# Patient Record
Sex: Female | Born: 1970 | Race: Black or African American | Hispanic: No | Smoking: Never smoker
Health system: Southern US, Community
[De-identification: ages and names within clinical notes are randomized; demographics above are authoritative.]

## PROBLEM LIST (undated history)

## (undated) DIAGNOSIS — E785 Hyperlipidemia, unspecified: Secondary | ICD-10-CM

## (undated) DIAGNOSIS — N289 Disorder of kidney and ureter, unspecified: Secondary | ICD-10-CM

## (undated) DIAGNOSIS — I2510 Atherosclerotic heart disease of native coronary artery without angina pectoris: Secondary | ICD-10-CM

## (undated) DIAGNOSIS — N186 End stage renal disease: Secondary | ICD-10-CM

## (undated) DIAGNOSIS — E119 Type 2 diabetes mellitus without complications: Secondary | ICD-10-CM

## (undated) DIAGNOSIS — I255 Ischemic cardiomyopathy: Secondary | ICD-10-CM

## (undated) DIAGNOSIS — Z9889 Other specified postprocedural states: Secondary | ICD-10-CM

## (undated) DIAGNOSIS — E1110 Type 2 diabetes mellitus with ketoacidosis without coma: Secondary | ICD-10-CM

## (undated) DIAGNOSIS — I10 Essential (primary) hypertension: Secondary | ICD-10-CM

## (undated) DIAGNOSIS — D649 Anemia, unspecified: Secondary | ICD-10-CM

## (undated) DIAGNOSIS — K219 Gastro-esophageal reflux disease without esophagitis: Secondary | ICD-10-CM

## (undated) DIAGNOSIS — M21372 Foot drop, left foot: Secondary | ICD-10-CM

## (undated) DIAGNOSIS — Z9483 Pancreas transplant status: Secondary | ICD-10-CM

## (undated) DIAGNOSIS — Z951 Presence of aortocoronary bypass graft: Secondary | ICD-10-CM

## (undated) DIAGNOSIS — Z940 Kidney transplant status: Secondary | ICD-10-CM

## (undated) DIAGNOSIS — I219 Acute myocardial infarction, unspecified: Secondary | ICD-10-CM

## (undated) HISTORY — DX: Type 2 diabetes mellitus with ketoacidosis without coma: E11.10

## (undated) HISTORY — PX: CARDIAC CATHETERIZATION: SHX172

## (undated) HISTORY — DX: Atherosclerotic heart disease of native coronary artery without angina pectoris: I25.10

## (undated) HISTORY — DX: Ischemic cardiomyopathy: I25.5

## (undated) HISTORY — DX: End stage renal disease: N18.6

## (undated) HISTORY — DX: Hyperlipidemia, unspecified: E78.5

## (undated) HISTORY — DX: Anemia, unspecified: D64.9

## (undated) HISTORY — DX: Acute myocardial infarction, unspecified: I21.9

---

## 1999-12-21 ENCOUNTER — Other Ambulatory Visit: Admission: RE | Admit: 1999-12-21 | Discharge: 1999-12-21 | Payer: Self-pay | Admitting: Obstetrics and Gynecology

## 1999-12-24 ENCOUNTER — Encounter (INDEPENDENT_AMBULATORY_CARE_PROVIDER_SITE_OTHER): Payer: Self-pay | Admitting: Specialist

## 1999-12-24 ENCOUNTER — Other Ambulatory Visit: Admission: RE | Admit: 1999-12-24 | Discharge: 1999-12-24 | Payer: Self-pay | Admitting: Obstetrics and Gynecology

## 2000-01-11 ENCOUNTER — Encounter: Admission: RE | Admit: 2000-01-11 | Discharge: 2000-04-10 | Payer: Self-pay | Admitting: Internal Medicine

## 2000-04-15 ENCOUNTER — Inpatient Hospital Stay (HOSPITAL_COMMUNITY): Admission: AD | Admit: 2000-04-15 | Discharge: 2000-04-15 | Payer: Self-pay | Admitting: Obstetrics and Gynecology

## 2000-04-15 ENCOUNTER — Encounter: Payer: Self-pay | Admitting: Obstetrics and Gynecology

## 2000-04-15 ENCOUNTER — Ambulatory Visit (HOSPITAL_COMMUNITY): Admission: RE | Admit: 2000-04-15 | Discharge: 2000-04-15 | Payer: Self-pay | Admitting: Obstetrics and Gynecology

## 2000-05-12 ENCOUNTER — Encounter: Admission: RE | Admit: 2000-05-12 | Discharge: 2000-05-12 | Payer: Self-pay | Admitting: Internal Medicine

## 2000-06-17 ENCOUNTER — Inpatient Hospital Stay (HOSPITAL_COMMUNITY): Admission: AD | Admit: 2000-06-17 | Discharge: 2000-06-17 | Payer: Self-pay | Admitting: Obstetrics and Gynecology

## 2000-06-30 ENCOUNTER — Inpatient Hospital Stay (HOSPITAL_COMMUNITY): Admission: AD | Admit: 2000-06-30 | Discharge: 2000-06-30 | Payer: Self-pay | Admitting: Obstetrics and Gynecology

## 2000-07-04 ENCOUNTER — Inpatient Hospital Stay (HOSPITAL_COMMUNITY): Admission: AD | Admit: 2000-07-04 | Discharge: 2000-07-04 | Payer: Self-pay | Admitting: Obstetrics & Gynecology

## 2000-07-04 ENCOUNTER — Encounter: Payer: Self-pay | Admitting: Obstetrics & Gynecology

## 2000-07-06 ENCOUNTER — Inpatient Hospital Stay (HOSPITAL_COMMUNITY): Admission: AD | Admit: 2000-07-06 | Discharge: 2000-07-06 | Payer: Self-pay | Admitting: Obstetrics and Gynecology

## 2000-07-11 ENCOUNTER — Inpatient Hospital Stay (HOSPITAL_COMMUNITY): Admission: AD | Admit: 2000-07-11 | Discharge: 2000-07-11 | Payer: Self-pay | Admitting: Obstetrics and Gynecology

## 2000-07-13 ENCOUNTER — Inpatient Hospital Stay (HOSPITAL_COMMUNITY): Admission: AD | Admit: 2000-07-13 | Discharge: 2000-07-17 | Payer: Self-pay | Admitting: Obstetrics and Gynecology

## 2008-06-21 ENCOUNTER — Encounter (HOSPITAL_COMMUNITY): Admission: RE | Admit: 2008-06-21 | Discharge: 2008-09-19 | Payer: Self-pay | Admitting: Nephrology

## 2008-07-18 ENCOUNTER — Ambulatory Visit (HOSPITAL_COMMUNITY): Admission: RE | Admit: 2008-07-18 | Discharge: 2008-07-19 | Payer: Self-pay | Admitting: General Surgery

## 2008-07-18 HISTORY — PX: LAPAROSCOPIC INSERTION PERITONEAL CATHETER: SUR773

## 2008-10-05 ENCOUNTER — Encounter: Admission: RE | Admit: 2008-10-05 | Discharge: 2008-10-05 | Payer: Self-pay | Admitting: Nephrology

## 2008-10-10 ENCOUNTER — Ambulatory Visit (HOSPITAL_COMMUNITY): Admission: RE | Admit: 2008-10-10 | Discharge: 2008-10-10 | Payer: Self-pay | Admitting: General Surgery

## 2008-10-10 HISTORY — PX: LAPAROSCOPIC REPOSITIONING CAPD CATHETER: SHX5920

## 2008-11-03 ENCOUNTER — Encounter: Admission: RE | Admit: 2008-11-03 | Discharge: 2008-11-03 | Payer: Self-pay | Admitting: Nephrology

## 2008-11-08 ENCOUNTER — Ambulatory Visit (HOSPITAL_COMMUNITY): Admission: RE | Admit: 2008-11-08 | Discharge: 2008-11-09 | Payer: Self-pay | Admitting: General Surgery

## 2008-11-08 ENCOUNTER — Encounter (INDEPENDENT_AMBULATORY_CARE_PROVIDER_SITE_OTHER): Payer: Self-pay | Admitting: General Surgery

## 2008-11-08 HISTORY — PX: OMENTECTOMY: SHX2098

## 2008-11-08 HISTORY — PX: LAPAROSCOPIC REPOSITIONING CAPD CATHETER: SHX5920

## 2009-09-21 ENCOUNTER — Encounter: Admission: RE | Admit: 2009-09-21 | Discharge: 2009-09-21 | Payer: Self-pay | Admitting: Nephrology

## 2009-10-02 ENCOUNTER — Encounter: Admission: RE | Admit: 2009-10-02 | Discharge: 2009-10-02 | Payer: Self-pay | Admitting: Nephrology

## 2009-10-03 ENCOUNTER — Ambulatory Visit (HOSPITAL_COMMUNITY): Admission: RE | Admit: 2009-10-03 | Discharge: 2009-10-03 | Payer: Self-pay | Admitting: General Surgery

## 2009-10-03 HISTORY — PX: LAPAROSCOPIC REPOSITIONING CAPD CATHETER: SHX5920

## 2010-02-14 ENCOUNTER — Ambulatory Visit: Payer: Self-pay | Admitting: Vascular Surgery

## 2010-02-16 ENCOUNTER — Ambulatory Visit: Payer: Self-pay | Admitting: Vascular Surgery

## 2010-02-16 ENCOUNTER — Ambulatory Visit (HOSPITAL_COMMUNITY)
Admission: RE | Admit: 2010-02-16 | Discharge: 2010-02-16 | Payer: Self-pay | Source: Home / Self Care | Admitting: Vascular Surgery

## 2010-05-30 HISTORY — PX: COMBINED KIDNEY-PANCREAS TRANSPLANT: SHX1382

## 2010-07-11 LAB — POCT I-STAT 4, (NA,K, GLUC, HGB,HCT)
Glucose, Bld: 62 mg/dL — ABNORMAL LOW (ref 70–99)
Hemoglobin: 9.9 g/dL — ABNORMAL LOW (ref 12.0–15.0)
Potassium: 3.4 mEq/L — ABNORMAL LOW (ref 3.5–5.1)
Sodium: 137 mEq/L (ref 135–145)

## 2010-07-11 LAB — GLUCOSE, CAPILLARY
Glucose-Capillary: 102 mg/dL — ABNORMAL HIGH (ref 70–99)
Glucose-Capillary: 132 mg/dL — ABNORMAL HIGH (ref 70–99)
Glucose-Capillary: 136 mg/dL — ABNORMAL HIGH (ref 70–99)

## 2010-07-11 LAB — SURGICAL PCR SCREEN: MRSA, PCR: POSITIVE — AB

## 2010-07-16 LAB — BASIC METABOLIC PANEL
Calcium: 7.9 mg/dL — ABNORMAL LOW (ref 8.4–10.5)
GFR calc non Af Amer: 3 mL/min — ABNORMAL LOW (ref >60)
Potassium: 4.5 mEq/L (ref 3.5–5.1)

## 2010-07-16 LAB — HEMOGLOBIN AND HEMATOCRIT, BLOOD: HCT: 28.2 % — ABNORMAL LOW (ref 36.0–46.0)

## 2010-07-16 LAB — GLUCOSE, CAPILLARY: Glucose-Capillary: 150 mg/dL — ABNORMAL HIGH (ref 70–99)

## 2010-08-05 LAB — CBC
HCT: 42.3 % (ref 36.0–46.0)
Hemoglobin: 13.7 g/dL (ref 12.0–15.0)
MCHC: 32.4 g/dL (ref 30.0–36.0)
Platelets: 302 10*3/uL (ref 150–400)
RBC: 5.21 MIL/uL — ABNORMAL HIGH (ref 3.87–5.11)
RDW: 17 % — ABNORMAL HIGH (ref 11.5–15.5)
WBC: 7.8 10*3/uL (ref 4.0–10.5)

## 2010-08-05 LAB — GLUCOSE, CAPILLARY
Glucose-Capillary: 128 mg/dL — ABNORMAL HIGH (ref 70–99)
Glucose-Capillary: 179 mg/dL — ABNORMAL HIGH (ref 70–99)
Glucose-Capillary: 51 mg/dL — ABNORMAL LOW (ref 70–99)

## 2010-08-05 LAB — BASIC METABOLIC PANEL
BUN: 60 mg/dL — ABNORMAL HIGH (ref 6–23)
Calcium: 8.3 mg/dL — ABNORMAL LOW (ref 8.4–10.5)
Chloride: 104 mEq/L (ref 96–112)
Creatinine, Ser: 10.24 mg/dL — ABNORMAL HIGH (ref 0.4–1.2)
GFR calc Af Amer: 5 mL/min — ABNORMAL LOW (ref >60)
GFR calc non Af Amer: 4 mL/min — ABNORMAL LOW (ref >60)
Glucose, Bld: 88 mg/dL (ref 70–99)
Potassium: 4.2 mEq/L (ref 3.5–5.1)

## 2010-08-05 LAB — DIFFERENTIAL
Basophils Absolute: 0.1 10*3/uL (ref 0.0–0.1)
Eosinophils Absolute: 0.2 10*3/uL (ref 0.0–0.7)
Monocytes Absolute: 0.5 10*3/uL (ref 0.1–1.0)
Monocytes Relative: 6 % (ref 3–12)

## 2010-08-06 LAB — BASIC METABOLIC PANEL
BUN: 54 mg/dL — ABNORMAL HIGH (ref 6–23)
CO2: 27 mEq/L (ref 19–32)
Chloride: 103 mEq/L (ref 96–112)
GFR calc non Af Amer: 5 mL/min — ABNORMAL LOW (ref >60)
Glucose, Bld: 57 mg/dL — ABNORMAL LOW (ref 70–99)
Potassium: 3.7 mEq/L (ref 3.5–5.1)

## 2010-08-06 LAB — DIFFERENTIAL
Basophils Absolute: 0 10*3/uL (ref 0.0–0.1)
Basophils Relative: 0 % (ref 0–1)
Eosinophils Absolute: 0.2 10*3/uL (ref 0.0–0.7)
Eosinophils Relative: 2 % (ref 0–5)
Monocytes Absolute: 0.5 10*3/uL (ref 0.1–1.0)

## 2010-08-06 LAB — CBC
HCT: 39 % (ref 36.0–46.0)
MCHC: 33.5 g/dL (ref 30.0–36.0)
MCV: 78.7 fL (ref 78.0–100.0)
Platelets: 368 10*3/uL (ref 150–400)
RDW: 16 % — ABNORMAL HIGH (ref 11.5–15.5)

## 2010-08-06 LAB — GLUCOSE, CAPILLARY
Glucose-Capillary: 50 mg/dL — ABNORMAL LOW (ref 70–99)
Glucose-Capillary: 81 mg/dL (ref 70–99)

## 2010-08-09 LAB — PTH, INTACT AND CALCIUM
Calcium, Total (PTH): 7.3 mg/dL — ABNORMAL LOW (ref 8.4–10.5)
PTH: 282.8 pg/mL — ABNORMAL HIGH (ref 14.0–72.0)

## 2010-08-09 LAB — BASIC METABOLIC PANEL
BUN: 66 mg/dL — ABNORMAL HIGH (ref 6–23)
BUN: 75 mg/dL — ABNORMAL HIGH (ref 6–23)
CO2: 18 mEq/L — ABNORMAL LOW (ref 19–32)
CO2: 19 mEq/L (ref 19–32)
CO2: 21 mEq/L (ref 19–32)
Calcium: 6.9 mg/dL — ABNORMAL LOW (ref 8.4–10.5)
Calcium: 7.2 mg/dL — ABNORMAL LOW (ref 8.4–10.5)
Calcium: 7.7 mg/dL — ABNORMAL LOW (ref 8.4–10.5)
Chloride: 106 mEq/L (ref 96–112)
Creatinine, Ser: 10.72 mg/dL — ABNORMAL HIGH (ref 0.4–1.2)
Creatinine, Ser: 10.93 mg/dL — ABNORMAL HIGH (ref 0.4–1.2)
Creatinine, Ser: 11.13 mg/dL — ABNORMAL HIGH (ref 0.4–1.2)
GFR calc Af Amer: 5 mL/min — ABNORMAL LOW (ref >60)
GFR calc non Af Amer: 4 mL/min — ABNORMAL LOW (ref >60)
GFR calc non Af Amer: 4 mL/min — ABNORMAL LOW (ref >60)
Glucose, Bld: 130 mg/dL — ABNORMAL HIGH (ref 70–99)
Glucose, Bld: 44 mg/dL — ABNORMAL LOW (ref 70–99)
Glucose, Bld: 78 mg/dL (ref 70–99)
Sodium: 141 mEq/L (ref 135–145)
Sodium: 143 mEq/L (ref 135–145)

## 2010-08-09 LAB — DIFFERENTIAL
Basophils Absolute: 0 10*3/uL (ref 0.0–0.1)
Basophils Relative: 1 % (ref 0–1)
Lymphocytes Relative: 19 % (ref 12–46)
Neutro Abs: 4.5 10*3/uL (ref 1.7–7.7)
Neutrophils Relative %: 67 % (ref 43–77)

## 2010-08-09 LAB — GLUCOSE, CAPILLARY
Glucose-Capillary: 112 mg/dL — ABNORMAL HIGH (ref 70–99)
Glucose-Capillary: 112 mg/dL — ABNORMAL HIGH (ref 70–99)
Glucose-Capillary: 42 mg/dL — ABNORMAL LOW (ref 70–99)
Glucose-Capillary: 83 mg/dL (ref 70–99)

## 2010-08-09 LAB — RENAL FUNCTION PANEL
Albumin: 3.6 g/dL (ref 3.5–5.2)
BUN: 81 mg/dL — ABNORMAL HIGH (ref 6–23)
Chloride: 105 mEq/L (ref 96–112)
Creatinine, Ser: 10.37 mg/dL — ABNORMAL HIGH (ref 0.4–1.2)
GFR calc Af Amer: 5 mL/min — ABNORMAL LOW (ref >60)
GFR calc non Af Amer: 4 mL/min — ABNORMAL LOW (ref >60)
Phosphorus: 6.4 mg/dL — ABNORMAL HIGH (ref 2.3–4.6)
Potassium: 4.1 mEq/L (ref 3.5–5.1)

## 2010-08-09 LAB — CBC
Hemoglobin: 9.9 g/dL — ABNORMAL LOW (ref 12.0–15.0)
MCHC: 33.8 g/dL (ref 30.0–36.0)
Platelets: 367 10*3/uL (ref 150–400)
RDW: 19.3 % — ABNORMAL HIGH (ref 11.5–15.5)

## 2010-08-09 LAB — IRON AND TIBC: UIBC: 282 ug/dL

## 2010-08-09 LAB — POCT HEMOGLOBIN-HEMACUE: Hemoglobin: 8 g/dL — ABNORMAL LOW (ref 12.0–15.0)

## 2010-08-14 LAB — POCT HEMOGLOBIN-HEMACUE: Hemoglobin: 8 g/dL — ABNORMAL LOW (ref 12.0–15.0)

## 2010-09-11 NOTE — Op Note (Signed)
NAME:  Priscilla [name], Priscilla [name]    ACCOUNT NO.:  [id number]   MEDICAL RECORD NO.:  [id number]          PATIENT TYPE:  OIB   LOCATION:  6733                         FACILITY:  [facility]   PHYSICIAN:  Marland Kitchen T. Hoxworth, M.D.DATE OF BIRTH:  1971-02-22   DATE OF PROCEDURE:  07/18/2008  DATE OF DISCHARGE:                               OPERATIVE REPORT   PREOPERATIVE DIAGNOSIS:  End-stage renal disease.   POSTOPERATIVE DIAGNOSIS:  End-stage renal disease.   SURGICAL PROCEDURE:  Laparoscopic placement of [place] peritoneal  dialysis catheter.   SURGEON:  Darene Lamer. Hoxworth, MD   ANESTHESIA:  General.   BRIEF HISTORY:  [name] is a 40 year old female with end-  stage renal disease and impending need for dialysis, secondary to  diabetes and hypertension.  She is interested in peritoneal dialysis.  I  have been asked to place a peritoneal dialysis catheter.  The nature of  the procedure, risks of anesthetic complications, bleeding, infection,  bowel injury, and catheter infection and malfunction was discussed and  understood.  She was now brought to the operating room for this  procedure.   DESCRIPTION OF OPERATION:  The patient was brought to the operating room  and placed in supine position up on the operating table, and general  orotracheal anesthesia was induced.  She received preoperative IV  antibiotics.  The abdomen was widely, sterilely prepped and draped.  Correct patient and procedure were verified.  PAS were in place.  Trocar  sites were infiltrated with local anesthesia.  Access was obtained in  the left upper quadrant laterally with a 5-mm Optiview trocar and  pneumoperitoneum established.  There was a very small amount of bleeding  from the abdominal wall, but no evidence of trocar injury.  Following  this under direct vision, an 11/12 trocar was placed just to the right  of the umbilicus.  There were some omental adhesions to the lower  midline from  previous C-section and these were taken down with careful  cautery dissection and hemostasis assured.  Following this, a right-  sided [place] peritoneal dialysis catheter was introduced via the 11 mm  trocar and its tip advanced into the low pelvis in the midline.  Holding  the catheter positioned at the abdominal wall with the Silastic ball  intraperitoneally and the Dacron cuff just superficial to the  peritoneum, the trocar was backed out and removed with the catheter in  good position.  The catheter was then tunneled subcutaneously to the  exit site with the external cuff about a centimeter  from the skin.  The head was again inspected for hemostasis for trocar  injury, and everything looked fine.  Trocars were removed and all CO2  evacuated.  Skin incisions were closed with 5-0 nylon.  The connector  kit was attached to the catheter.  Dry sterile dressings were applied.  The patient was taken to recovery room in good condition.      Darene Lamer. Hoxworth, M.D.  Electronically Signed     BTH/MEDQ  D:  07/18/2008  T:  07/18/2008  Job:  [id number]   cc:   Sherril Croon, M.D.

## 2010-09-11 NOTE — Op Note (Signed)
NAMEADAOBI, BIAGIONI    ACCOUNT NO.:  [id number]   MEDICAL RECORD NO.:  [id number]          PATIENT TYPE:  INP   LOCATION:  6705                         FACILITY:  [facility]   PHYSICIAN:  Marland Kitchen T. Hoxworth, M.D.DATE OF BIRTH:  09/24/1970   DATE OF PROCEDURE:  11/08/2008  DATE OF DISCHARGE:                               OPERATIVE REPORT   PREOPERATIVE DIAGNOSES:  Malfunctioning peritoneal dialysis catheter.   POSTOPERATIVE DIAGNOSIS:  Malfunctioning peritoneal dialysis catheter.   SURGICAL PROCEDURE:  Laparoscopic repositioning of peritoneal dialysis  catheter with laparoscopic omentectomy.   SURGEON:  Darene Lamer. Hoxworth, MD   ANESTHESIA:  General.   BRIEF HISTORY:  Ms. Lippe is a 40 year old female with end-  stage renal disease status post peritoneal dialysis catheter placement.  She has one previous episode of catheter malfunction with omental  wrapping of the catheter with repositioning laparoscopically  approximately 2 months ago.  She now again has malfunction of her  catheter with it again being pulled up into the left midabdomen.  I have  recommended laparoscopic repositioning and possible omentectomy.  The  procedure was discussed with the patient including risks of bleeding,  infection, anesthetic risks, and others.  She is now brought to the  operating room for this procedure.   DESCRIPTION OF THE OPERATION:  The patient was brought to the operating  room, placed in a supine position on the operating table and general  endotracheal anesthesia was induced.  She received preoperative IV  antibiotics.  The abdomen was widely sterilely prepped and draped.  Access was obtained with a 5 mm OptiVu trocar in the left upper quadrant  without difficulty and pneumoperitoneum established.  Under direct  vision, another 5 mm trocar was placed in the left lower quadrant.  Again noted was a tongue of omentum wrapped around the distal catheter  which had  occluded it and pulled it up into the left midabdomen.  The  catheter was separated from the omentum.  There was no evidence of  peritonitis or other abnormalities.  The catheter was repositioned in  the deep pelvis.  Due to recurrent episodes, I elected to proceed with a  partial omentectomy.  Using the harmonic scalpel through an additional  11-mm port placed in the low midline, I came across the omentum a couple  of centimeters below the transverse colon from the splenic flexure over  to the hepatic flexure.  The resected omentum was then placed in an  EndoCatch bag and brought out through the 11-mm trocar site.  The  abdomen was irrigated and hemostasis assured.  It did not appear that  the omentum at this point would be able to reach down to the catheter.  There was no evidence  of trocar injury.  The 11-mm trocar was removed and an Endoclose with 0  Vicryl was used to close the fascial and peritoneal defect.  The trocars  were removed and all CO2 evacuated.  Skin incisions were closed with  running or interrupted 4-0 nylon.  Sponge and needle counts were  correct.  The patient was taken to recovery in good condition.      Darene Lamer. Hoxworth, M.D.  Electronically Signed     BTH/MEDQ  D:  11/08/2008  T:  11/09/2008  Job:  [id number]   cc:   Windy Kalata, M.D.

## 2010-09-11 NOTE — Op Note (Signed)
NAME:  CHRISSEY, RINGOR    ACCOUNT NO.:  [id number]   MEDICAL RECORD NO.:  [id number]           PATIENT TYPE:   LOCATION:                                 FACILITY:   PHYSICIAN:  Marland Kitchen T. Hoxworth, M.D.DATE OF BIRTH:  04-08-1971   DATE OF PROCEDURE:  Apr 22, 202010  DATE OF DISCHARGE:                               OPERATIVE REPORT   PREOPERATIVE DIAGNOSIS:  Malpositioned peritoneal dialysis catheter.   POSTOPERATIVE DIAGNOSIS:  Malpositioned peritoneal dialysis catheter.   SURGICAL PROCEDURES:  Laparoscopic repositioning of peritoneal dialysis  catheter.   SURGEON:  Darene Lamer. Hoxworth, MD   ANESTHESIA:  General.   BRIEF HISTORY:  Flo [name] is a 40 year old female with  diabetes and hypertension and end-stage renal disease.  She underwent  uneventful laparoscopic placement of a [place] peritoneal dialysis  catheter in March.  It has functioned well until 5 days ago and she was  unable to drain fluid.  KUB shows the catheter tip positioned now in the  left upper abdomen.  I have recommended laparoscopic repositioning.  The  nature of procedure, indication, risks of anesthesia, trocar injury,  catheter malfunction, and infection were discussed and understood.  She  is now brought to the operating room for this procedure.   DESCRIPTION OF OPERATION:  The patient was brought to the operating room  and placed in the supine position on the operating table and general  endotracheal anesthesia was induced.  She received preoperative IV  antibiotics.  The abdomen was widely sterilely prepped and draped.  Correct patient and procedure were verified.  Access was then obtained  with the previous 5-mm site with an 5-mm OptiVu in the left upper  quadrant and pneumoperitoneum was established.  There was no evidence of  trocar injury on careful inspection.  The catheter was seen to be  wrapped with its tip and omentum and pulled up into the left upper  quadrant.  A  second 5-mm trocar was placed under direct vision in the  left lower quadrant.  Using careful blunt dissection, the catheter was  then teased away from the omentum until it was completely free.  The  catheter was then repositioned in the deep pelvis and stayed there very  naturally without any tendency to flip into the upper abdomen.  This  appeared to be all secondary to the omental wrapping.  The omentum was  inspected for bleeding and there was no significant bleeding.  Following  this, CO2 was evacuated and trocars were removed.  The skin incisions  were closed with interrupted 5-0 nylon.  Dressings were applied.  Sponges and instrument counts were correct.  The patient was taken to  the recovery room in good condition.      Darene Lamer. Hoxworth, M.D.  Electronically Signed     BTH/MEDQ  D:  Apr 22, 202010  T:  10/11/2008  Job:  [id number]

## 2010-09-11 NOTE — Consult Note (Signed)
NEW PATIENT CONSULTATION   Priscilla [name], Priscilla [name]  DOB:  Feb 04, 1971                                       02/14/2010  [id number]   I saw this patient in the office today in consultation for hemodialysis  access.  This is a pleasant 40 year old woman with end-stage renal  disease.  This is secondary to diabetes and hypertension.  She began  peroneal dialysis in April 2010.  She had recurrent problems with  infection of the PD catheter and most recently finished a course of  vancomycin and is also currently on p.o. Cipro.  Given repeated problems  with infection, we are asked to evaluate her for long-term access for  hemodialysis access.   Of note, she had no recent uremic symptoms.  Specifically she denies  problems with fatigue, anorexia, nausea, vomiting, palpitations or  shortness of breath.   PAST MEDICAL HISTORY:  Significant for juvenile onset diabetes,  hypertension, hypercholesterolemia, and chronic kidney disease.  She  denies any history of previous MI or congestive heart failure.   SOCIAL HISTORY:  She is single.  She has one child.  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  She does not use tobacco.  She is not drink alcohol on a regular basis.   FAMILY HISTORY:  There is no history of premature cardiovascular  disease.   REVIEW OF SYSTEMS:  GENERAL:  She has had no recent weight loss, weight  gain or problems with appetite.  CARDIOVASCULAR:  She had no chest pain, chest pressure, palpitations or  arrhythmias.  She has had no history of stroke or TIA.  She had no  history of DVT or phlebitis.  GI, NEUROLOGIC, PULMONARY, HEMATOLOGIC, GU, ENT, MUSCULOSKELETAL,  PSYCHIATRIC, AND INTEGUMENTARY:  Unremarkable as documented on the  medical history form in her chart.   PHYSICAL EXAMINATION:  This is a pleasant 40 year old woman who appears  her stated age.  Her blood pressure 193/98, temperature is 98, heart  rate is 86.  HEENT:  Unremarkable.  Lungs:  Clear  bilaterally to  auscultation without rales, rhonchi or wheezing.  Cardiovascular:  I do  not detect any carotid bruits.  She has a regular rate and rhythm.  She  has palpable femoral pulses and warm well-perfused feet.  She has  palpable brachial pulses and palpable radial pulses although somewhat  difficult to palpate.  She does have triphasic radial and ulnar signals  with Doppler bilaterally.  Abdomen:  Soft and nontender with normal  pitched bowel sounds.  She has a peroneal dialysis catheter placed.  Musculoskeletal:  No major deformities or cyanosis.  Neurologic:  She  has no focal weakness or paresthesias.  Skin:  There are no ulcers or  rashes.    I did independently interpret her vein mapping which on the left side  shows that her forearm and upper arm cephalic vein are quite small.  The  basilic vein is marginal in size.  She might potentially be a candidate  for basilic transposition.  Likewise on the right side, the forearm and  upper arm cephalic vein are quite small, the basilic vein is marginal in  size but larger than the cephalic vein.   I have reviewed her records from Dr. Jason Nest office and also her op note  from 10/03/2009, when she had laparoscopy and removal of a fibrin plug  from her  peroneal dialysis catheter.   Given the problems she has had with repeated peritonitis, I think it is  reasonable to proceed with placement of hemodialysis access.  She does  not appear to have an active infection currently.  I have recommend we  explore her basilic vein on the left and if this is adequate, do a  basilic vein transposition on the left side.  If this is not adequate,  we  would place an AV graft.  I have discussed the indications for  surgery and potential complications including, but not limited to,  bleeding, wound healing problems, swelling, failure of the fistula to  mature, graft thrombosis, graft infection, steal syndrome.  All  questions were answered.  She is  agreeable to proceed.  She is scheduled  for 02/16/2010.     Judeth Cornfield. Scot Dock, M.D.  Electronically Signed   CSD/MEDQ  D:  02/14/2010  T:  02/15/2010  Job:  [id number]   cc:   Sherril Croon, M.D.

## 2010-09-11 NOTE — Procedures (Signed)
CEPHALIC VEIN MAPPING   INDICATION:  Preop evaluation for AV fistula placement.   HISTORY:  End-stage renal disease.   EXAM:  The right cephalic vein is compressible.   Diameter measurements range from 0.12 to 0.42 cm.   The right basilic vein is compressible.   Diameter measurements range from 0.28 to 0.40 cm.   The left cephalic vein is compressible.   Diameter measurements range from 0.12 to 0.27 cm.   The left basilic vein is compressible.   Diameter measurements range from 0.27 to 0.36 cm.   See attached worksheet for all measurements.   IMPRESSION:  Patent bilateral cephalic and basilic veins with diameter  measurements as described above.   ___________________________________________  Judeth Cornfield. Scot Dock, M.D.   CH/MEDQ  D:  02/14/2010  T:  02/14/2010  Job:  [id number]

## 2010-09-14 NOTE — Discharge Summary (Signed)
[facility]  Patient:    Priscilla [name], Priscilla [name]                     MRN: [id number] Adm. Date:  [id number] Disc. Date: [id number] Attending:  [place] B                           Discharge Summary  ADMISSION DIAGNOSES:          Insulin-dependent diabetes, intrauterine gestation at 39+ weeks gestation, sickle cell trait, group B strep culture positive, intrauterine growth restriction.  DISCHARGE DIAGNOSES:          Term gestation delivered, intrauterine growth restriction, arrest of dilatation, insulin-dependent diabetes, sickle cell trait, subserosal fibroid.  PROCEDURE:                    Primary cesarean section.  HISTORY OF PRESENT ILLNESS:   This is a 40 year old gravida 1, para 0 female with known insulin-dependent diabetes now at 39+ weeks gestation admitted for induction of labor secondary to her diabetes and intrauterine growth restriction.  The patient has been followed closely with antenatal testing. Her blood sugars have been managed by Dr. Dagmar Hait.  Last ultrasound on March 11 showed an intrauterine gestation of 36.2 weeks, 2750 g which is in the ninth percentile, normal Dopplers.  HOSPITAL COURSE:              The patient was admitted.  Her examination was notable for her cervix being closed, long, vertex -3.  She was placed on diabetes protocol.  Continuous fetal monitoring was obtained.  Prostin gel x 2 was planned.  Insulin prophylaxis was done secondary to her known group B strep positive culture.  The patient was subsequently started on intravenous Pitocin the following day.  Her blood sugars had not required any insulin throughout her labor course.  The patient had a transient increase in blood pressures for which [facility] laboratories were obtained that were subsequently noted to be normal.  The patient had intrauterine pressure catheter placement, internal fetal scalp electrodes placed.  Amnio-infusion was subsequently performed due to  some deep variable decelerations.  The labor continued. Patient subsequently progressed to 4 cm, 80%, 0 station.  Her [place] units were on an average around 200.  The patient arrested at the same dilatation with adequate labor.  She subsequently was taken to the operating room on July 15, 2000 where subsequent delivery of a live female from the right occiput transverse position was accomplished.  Cord around the neck x 2 was noted. Apgars 9/9.  Weight 5 pounds 6 ounces.  Placenta was spontaneous, intact, sent to pathology and was subsequently noted to be a mature placenta.  Normal tubes and ovaries were noted at the time of surgery.  She had a 2.5 cm fundal subserosal fibroid.  Her postoperative course was unremarkable.  The patient was able to manage her blood sugars with the usual regimen as she has been doing for several years.  By postoperative day #3 the patient was without evidence of infection.  Her fasting blood sugar was 88.  Her incision was without any evidence of erythema, induration, or exudate.  She was discharged home.  DISPOSITION:                  Home.  CONDITION:                    Stable.  DISCHARGE MEDICATIONS:        1. Tylox #30 one p.o. q.4h.                               2. Motrin 600 mg #30 one p.o. q.6h.                               3. Prenatal vitamins one p.o. q.d.                               4. Sliding scale insulin regimen.  FOLLOW-UP:                    [facility] in four weeks.  Dr. Dagmar Hait. DD:  08/25/00 TD:  08/25/00 Job: [id number]

## 2010-09-14 NOTE — Op Note (Signed)
[facility]  Patient:    Priscilla [name], Priscilla [name]                     MRN: [id number] Proc. Date: 07/15/00 Adm. Date:  [id number] Attending:  [place] B                           Operative Report  PREOPERATIVE DIAGNOSES:       1. Arrest of dilatation.                               2. Intrauterine growth restriction.                               3. Insulin-dependent diabetic.                               4. Term gestation.  POSTOPERATIVE DIAGNOSES:      1. Arrest of dilatation.                               2. Intrauterine growth restriction.                               3. Insulin-dependent diabetic.                               4. Term gestation.  OPERATION:                    Primary cesarean section, Kerr hysterotomy.  SURGEON:                      Sheronette A. Garwin Brothers, M.D.  ASSISTANT:  ANESTHESIA:                   Epidural anesthesia.  ESTIMATED BLOOD LOSS:         700 cc.  INTRAOPERATIVE FLUID:         1300 cc Crystalloid.  URINE OUTPUT:                 100 cc intraoperatively.  DESCRIPTION OF PROCEDURE:     Under adequate epidural anesthesia, the patient was placed in the supine position with a left lateral tilt.  An indwelling Foley catheter had already been in place prior to the patient being transferred to the operating room.  The patient was sterilely prepped and draped in the usual fashion.  A Pfannenstiel skin incision was then made, carried down to the rectus fascia using Bovie for cautery.  Rectus fascia was incised in the midline and extended bilaterally. The rectus fascia was then bluntly and with sharp dissection, dissected off the rectus muscle in superior and inferior fashion.  The rectus muscle was split in the midline. The parietal peritoneum was bluntly entered and extended superiorly and inferiorly.  A vesicouterine peritoneum was then opened. Bladder was the bluntly dissected off the lower uterine segment and  displaced from the operative field using a Doyen retractor.  The lower uterine segment was well developed.  A curvilinear low transverse uterine incision was then made and extended using  bandage scissors.  Copious amount of fluid was noted. Subsequent delivery of a live female infant from the right occiput transverse position was accomplished.  The baby was bulb suctioned on the abdomen.  Cord around the neck x 2 was easily reduced.  Cord was subsequently clamped and cut. The baby was transferred to the awaiting pediatrician who has assigned Apgars of 9 and 9.  Weight of the baby was 5 pounds 6 ounces.  The uterus was then cleaned of debris.  The uterine incision was then closed with 0 Monocryl running locked stitch first layer.  The second layer was imbricated with 0 Monocryl suture.  The right angle was noted to have bleeding and a single suture of 0 Monocryl was then utilized for good hemostasis.  The pericolic gutters were cleaned of debris.  Normal tubes and ovaries were noted bilaterally.  A 2.5 cm subserosal/pedunculated fibroid was noted in the fundal area.  No other fibroid was palpated.  Reinspection of the incision site showed good hemostasis.  The vesicouterine peritoneum and the parietal peritoneum were now closed. The fascia was inspected. Small bleeders cauterized.  The fascia was then closed with 0 Vicryl suture x 2.  The skin was injected with 0.25% Marcaine.  The subcutaneous area was irrigated.  Small bleeders cauterized and skin approximated using Ethicon staples.  Specimen was the placenta sent to pathology.  Sponge and instrument counts correct x 2. There were no complications.  The patient tolerated the procedure well and was transferred to the recovery room in stable condition. DD:  07/15/00 TD:  07/15/00 Job: [id number]

## 2011-01-29 ENCOUNTER — Other Ambulatory Visit: Payer: Self-pay | Admitting: Obstetrics and Gynecology

## 2011-01-29 ENCOUNTER — Other Ambulatory Visit (HOSPITAL_COMMUNITY)
Admission: RE | Admit: 2011-01-29 | Discharge: 2011-01-29 | Disposition: A | Discharge status: Home / Self Care | Payer: BC Managed Care – PPO | Source: Ambulatory Visit | Attending: Obstetrics and Gynecology | Admitting: Obstetrics and Gynecology

## 2011-01-29 DIAGNOSIS — Z124 Encounter for screening for malignant neoplasm of cervix: Secondary | ICD-10-CM | POA: Insufficient documentation

## 2011-01-29 DIAGNOSIS — Z1159 Encounter for screening for other viral diseases: Secondary | ICD-10-CM | POA: Insufficient documentation

## 2011-01-29 DIAGNOSIS — Z113 Encounter for screening for infections with a predominantly sexual mode of transmission: Secondary | ICD-10-CM | POA: Insufficient documentation

## 2011-03-23 IMAGING — CR DG ABDOMEN 1V
1 series · 1 of 1 positions shown · non-contrast
Comparison: None.

CLINICAL DATA: Peritoneal dialysis catheter.

ABDOMEN - 1 VIEW

[t abdomen supine]
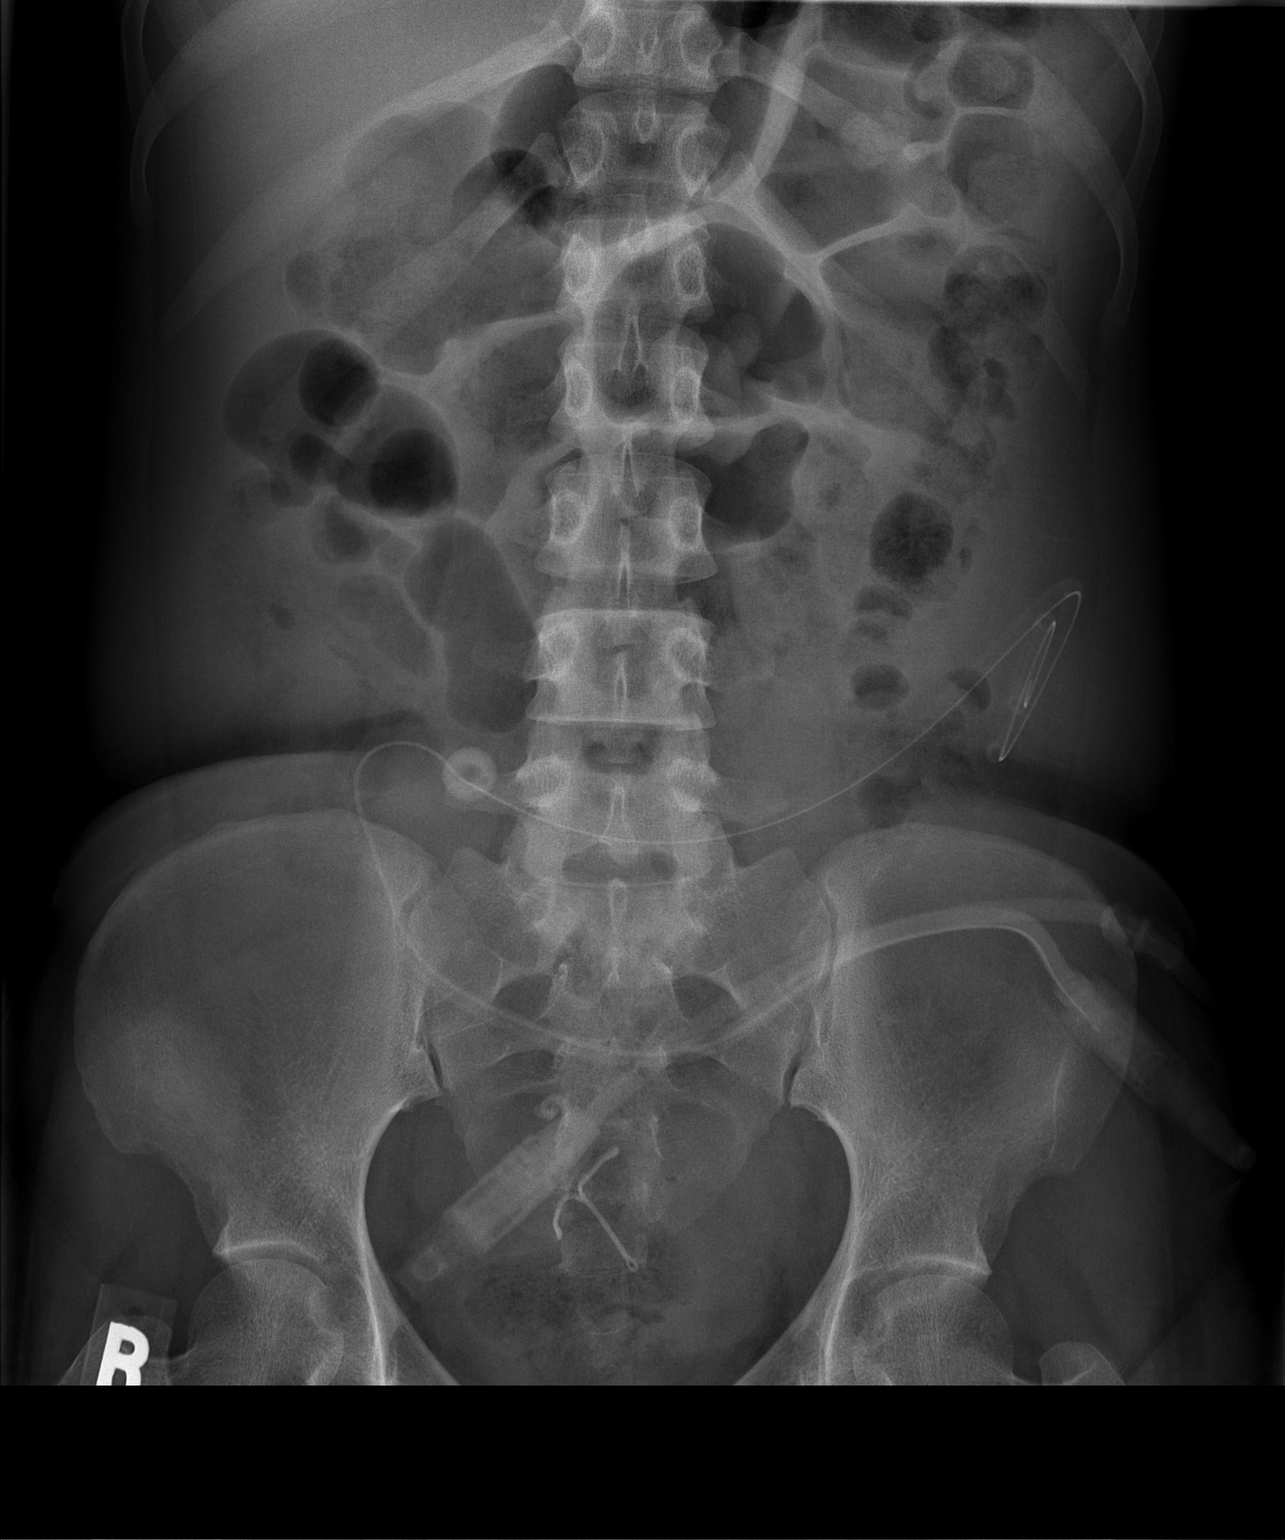

[1 of 1 positions shown; findings below may reference images not displayed]

FINDINGS: Bowel gas pattern is normal with air seen scattered and
nondilated small bowel and air and stool scattered along the length
of the colon.  Peritoneal dialysis catheter enters via the the
medial right abdomen with the catheter tip coiled along the left
lateral abdominal sidewall, probably in the left pericolic gutter.
IUD projects over the central pelvis.
IMPRESSION: Peritoneal dialysis catheter tip is coiled in the left abdomen,
likely residing in the paracolic gutter.

## 2012-09-24 ENCOUNTER — Emergency Department (HOSPITAL_COMMUNITY): Payer: Medicare Other

## 2012-09-24 ENCOUNTER — Encounter (HOSPITAL_COMMUNITY): Payer: Self-pay

## 2012-09-24 ENCOUNTER — Inpatient Hospital Stay (HOSPITAL_COMMUNITY)
Admission: EM | Admit: 2012-09-24 | Discharge: 2012-09-25 | DRG: 698 | Disposition: A | Discharge status: Other Acute Inpatient Hospital | Payer: Medicare Other | Attending: Internal Medicine | Admitting: Internal Medicine

## 2012-09-24 DIAGNOSIS — Z79899 Other long term (current) drug therapy: Secondary | ICD-10-CM

## 2012-09-24 DIAGNOSIS — T86899 Unspecified complication of other transplanted tissue: Secondary | ICD-10-CM

## 2012-09-24 DIAGNOSIS — T8610 Unspecified complication of kidney transplant: Principal | ICD-10-CM | POA: Diagnosis present

## 2012-09-24 DIAGNOSIS — T8612 Kidney transplant failure: Secondary | ICD-10-CM | POA: Diagnosis present

## 2012-09-24 DIAGNOSIS — E872 Acidosis, unspecified: Secondary | ICD-10-CM | POA: Diagnosis present

## 2012-09-24 DIAGNOSIS — I120 Hypertensive chronic kidney disease with stage 5 chronic kidney disease or end stage renal disease: Secondary | ICD-10-CM | POA: Diagnosis present

## 2012-09-24 DIAGNOSIS — T8611 Kidney transplant rejection: Secondary | ICD-10-CM | POA: Diagnosis present

## 2012-09-24 DIAGNOSIS — Z9119 Patient's noncompliance with other medical treatment and regimen: Secondary | ICD-10-CM

## 2012-09-24 DIAGNOSIS — N2581 Secondary hyperparathyroidism of renal origin: Secondary | ICD-10-CM | POA: Diagnosis present

## 2012-09-24 DIAGNOSIS — Z91199 Patient's noncompliance with other medical treatment and regimen due to unspecified reason: Secondary | ICD-10-CM

## 2012-09-24 DIAGNOSIS — K859 Acute pancreatitis without necrosis or infection, unspecified: Secondary | ICD-10-CM | POA: Diagnosis present

## 2012-09-24 DIAGNOSIS — N186 End stage renal disease: Secondary | ICD-10-CM | POA: Diagnosis present

## 2012-09-24 DIAGNOSIS — E109 Type 1 diabetes mellitus without complications: Secondary | ICD-10-CM | POA: Diagnosis present

## 2012-09-24 DIAGNOSIS — D631 Anemia in chronic kidney disease: Secondary | ICD-10-CM | POA: Diagnosis present

## 2012-09-24 DIAGNOSIS — Z9483 Pancreas transplant status: Secondary | ICD-10-CM

## 2012-09-24 DIAGNOSIS — Y830 Surgical operation with transplant of whole organ as the cause of abnormal reaction of the patient, or of later complication, without mention of misadventure at the time of the procedure: Secondary | ICD-10-CM | POA: Diagnosis present

## 2012-09-24 HISTORY — DX: Pancreas transplant status: Z94.83

## 2012-09-24 HISTORY — DX: Disorder of kidney and ureter, unspecified: N28.9

## 2012-09-24 HISTORY — DX: Type 2 diabetes mellitus without complications: E11.9

## 2012-09-24 HISTORY — DX: Kidney transplant status: Z94.0

## 2012-09-24 HISTORY — DX: Essential (primary) hypertension: I10

## 2012-09-24 LAB — CBC WITH DIFFERENTIAL/PLATELET
Basophils Absolute: 0.1 10*3/uL (ref 0.0–0.1)
Basophils Relative: 1 % (ref 0–1)
Eosinophils Absolute: 0.2 10*3/uL (ref 0.0–0.7)
HCT: 29.9 % — ABNORMAL LOW (ref 36.0–46.0)
Hemoglobin: 10.1 g/dL — ABNORMAL LOW (ref 12.0–15.0)
Lymphocytes Relative: 14 % (ref 12–46)
Monocytes Relative: 6 % (ref 3–12)
Neutro Abs: 8 10*3/uL — ABNORMAL HIGH (ref 1.7–7.7)
Neutrophils Relative %: 77 % (ref 43–77)
RBC: 5.01 MIL/uL (ref 3.87–5.11)
WBC: 10.4 10*3/uL (ref 4.0–10.5)

## 2012-09-24 LAB — COMPREHENSIVE METABOLIC PANEL
ALT: 12 U/L (ref 0–35)
AST: 16 U/L (ref 0–37)
Alkaline Phosphatase: 117 U/L (ref 39–117)
CO2: 10 mEq/L — CL (ref 19–32)
Calcium: 7.9 mg/dL — ABNORMAL LOW (ref 8.4–10.5)
GFR calc Af Amer: 5 mL/min — ABNORMAL LOW (ref >90)
Glucose, Bld: 92 mg/dL (ref 70–99)
Potassium: 4.6 mEq/L (ref 3.5–5.1)
Sodium: 127 mEq/L — ABNORMAL LOW (ref 135–145)
Total Protein: 7.8 g/dL (ref 6.0–8.3)

## 2012-09-24 LAB — URINALYSIS, ROUTINE W REFLEX MICROSCOPIC
Bilirubin Urine: NEGATIVE
Ketones, ur: NEGATIVE mg/dL
Specific Gravity, Urine: 1.008 (ref 1.005–1.030)
pH: 7 (ref 5.0–8.0)

## 2012-09-24 LAB — URINE MICROSCOPIC-ADD ON

## 2012-09-24 LAB — MAGNESIUM: Magnesium: 2.3 mg/dL (ref 1.5–2.5)

## 2012-09-24 LAB — URIC ACID: Uric Acid, Serum: 9.5 mg/dL — ABNORMAL HIGH (ref 2.4–7.0)

## 2012-09-24 LAB — LIPASE, BLOOD: Lipase: 296 U/L — ABNORMAL HIGH (ref 11–59)

## 2012-09-24 LAB — CK: Total CK: 71 U/L (ref 7–177)

## 2012-09-24 MED ORDER — SODIUM BICARBONATE 8.4 % IV SOLN
50.0000 meq | Freq: Once | INTRAVENOUS | Status: AC
  Administered 2012-09-24: 50 meq via INTRAVENOUS
  Filled 2012-09-24: qty 50

## 2012-09-24 MED ORDER — SODIUM CHLORIDE 0.9 % IV BOLUS (SEPSIS)
1000.0000 mL | Freq: Once | INTRAVENOUS | Status: AC
  Administered 2012-09-24: 1000 mL via INTRAVENOUS

## 2012-09-24 MED ORDER — LORAZEPAM 1 MG PO TABS
1.0000 mg | ORAL_TABLET | Freq: Once | ORAL | Status: AC
  Administered 2012-09-24: 1 mg via ORAL
  Filled 2012-09-24: qty 1

## 2012-09-24 MED ORDER — MYCOPHENOLATE SODIUM 180 MG PO TBEC
360.0000 mg | DELAYED_RELEASE_TABLET | Freq: Two times a day (BID) | ORAL | Status: DC
  Administered 2012-09-24 – 2012-09-25 (×2): 360 mg via ORAL
  Filled 2012-09-24 (×3): qty 2

## 2012-09-24 MED ORDER — PROMETHAZINE HCL 25 MG PO TABS
12.5000 mg | ORAL_TABLET | Freq: Four times a day (QID) | ORAL | Status: DC | PRN
  Administered 2012-09-25: 12.5 mg via ORAL
  Filled 2012-09-24 (×2): qty 1

## 2012-09-24 MED ORDER — HEPARIN SODIUM (PORCINE) 5000 UNIT/ML IJ SOLN
5000.0000 [IU] | Freq: Three times a day (TID) | INTRAMUSCULAR | Status: DC
  Administered 2012-09-24 – 2012-09-25 (×2): 5000 [IU] via SUBCUTANEOUS
  Filled 2012-09-24 (×5): qty 1

## 2012-09-24 MED ORDER — SODIUM CHLORIDE 0.9 % IV SOLN
500.0000 mg | INTRAVENOUS | Status: DC
  Administered 2012-09-24: 500 mg via INTRAVENOUS
  Filled 2012-09-24 (×2): qty 4

## 2012-09-24 MED ORDER — ONDANSETRON HCL 4 MG/2ML IJ SOLN
4.0000 mg | Freq: Once | INTRAMUSCULAR | Status: AC
  Administered 2012-09-24: 4 mg via INTRAVENOUS
  Filled 2012-09-24: qty 2

## 2012-09-24 MED ORDER — SODIUM CHLORIDE 0.9 % IJ SOLN
3.0000 mL | Freq: Two times a day (BID) | INTRAMUSCULAR | Status: DC
  Administered 2012-09-25: 3 mL via INTRAVENOUS

## 2012-09-24 MED ORDER — TACROLIMUS 1 MG PO CAPS
4.0000 mg | ORAL_CAPSULE | Freq: Two times a day (BID) | ORAL | Status: DC
  Administered 2012-09-24 – 2012-09-25 (×2): 4 mg via ORAL
  Filled 2012-09-24 (×3): qty 4

## 2012-09-24 MED ORDER — SODIUM BICARBONATE 8.4 % IV SOLN
INTRAVENOUS | Status: DC
  Administered 2012-09-24: 23:00:00 via INTRAVENOUS
  Filled 2012-09-24 (×5): qty 150

## 2012-09-24 MED ORDER — ASPIRIN 325 MG PO TABS
325.0000 mg | ORAL_TABLET | Freq: Every day | ORAL | Status: DC
  Administered 2012-09-24 – 2012-09-25 (×2): 325 mg via ORAL
  Filled 2012-09-24 (×2): qty 1

## 2012-09-24 MED ORDER — STERILE WATER FOR INJECTION IV SOLN
INTRAVENOUS | Status: DC
  Administered 2012-09-24: 20:00:00 via INTRAVENOUS
  Filled 2012-09-24 (×2): qty 850

## 2012-09-24 NOTE — ED Notes (Signed)
Phlebotomy in room. 

## 2012-09-24 NOTE — H&P (Signed)
[facility] History and Physical  Priscilla [name] [id number] DOB: 1970/09/12 DOA: 09/24/2012  Referring physician: ED PCP: [name], MD  Specialists: Dr. Jimmy Footman seeing patient for nephro, [facility].  Chief Complaint: N/V, abd cramps  HPI: Priscilla [name] is a 42 y.o. female with type 1 DM, who developed ESRD and received a kidney/pancreas transplant at [facility] 04/2010.  She is followed by Dr. Justin Mend but hadnt been seen in office since 10/13.  Patient admits to not taking prograf and myfortic regularly recently because of financial reasons she states.  On Sunday she had stiff neck and began to feel ill, which became worse until she developed N/V for the past 24 hours or so with 7 episodes of vomiting.  Abdominal cramps from vomiting.  Fever of 100.1 on Tuesday.  No dysuria, is still passing some urine.  Dosent feel overly dehydrated.  Because of these symptoms she presented to the ED.  In the ED the patients labs demonstrated lipase of 290, creatinine > 10, bicarb 10, BUN 70s.  Dr. Jimmy Footman was consulted and hospitalist asked to admit.  Review of Systems: 12 systems reviewed and otherwise negative.  Past Medical History  Diagnosis Date  . H/O kidney transplant   . Pancreas transplanted   . Diabetes mellitus without complication   . Hypertension   . Renal disorder    Past Surgical History  Procedure Laterality Date  . Nephrectomy transplanted organ    . Combined kidney-pancreas transplant     Social History:  reports that she has never smoked. She does not have any smokeless tobacco history on file. She reports that she does not drink alcohol or use illicit drugs.   No Known Allergies  Family History  Problem Relation Age of Onset  . CAD Neg Hx     Prior to Admission medications   Medication Sig Start Date End Date Taking? Authorizing Provider  aspirin 325 MG tablet Take 325 mg by mouth daily.   Yes Historical Provider, MD   ibuprofen (ADVIL,MOTRIN) 200 MG tablet Take 400 mg by mouth every 6 (six) hours as needed for pain.   Yes Historical Provider, MD  mycophenolate (MYFORTIC) 180 MG EC tablet Take 360 mg by mouth 2 (two) times daily.    Yes Historical Provider, MD  predniSONE (DELTASONE) 5 MG tablet Take 5 mg by mouth daily.   Yes Historical Provider, MD  SIMVASTATIN PO Take 1 tablet by mouth at bedtime.   Yes Historical Provider, MD  sulfamethoxazole-trimethoprim (BACTRIM DS,SEPTRA DS) 800-160 MG per tablet Take 1 tablet by mouth daily. Takes mon, wed and fri each week   Yes Historical Provider, MD  tacrolimus (PROGRAF) 1 MG capsule Take 3-4 mg by mouth 2 (two) times daily. 4 tablets in morning, and 3 tablets in evening, 7 total tablets daily.   Yes Historical Provider, MD   Physical Exam: Filed Vitals:   09/24/12 1633 09/24/12 2029 09/24/12 2030  BP: 160/87 163/94 163/94  Pulse: 97 106   Temp: 98.5 F (36.9 C) 98.2 F (36.8 C)   TempSrc: Oral Oral   Resp: 17 20   SpO2: 99% 100%     General:  NAD, resting comfortably in bed Eyes: PEERLA EOMI ENT: mucous membranes moist Neck: supple w/o JVD Cardiovascular: RRR w/o MRG Respiratory: CTA B Abdomen: soft, ? RLQ tenderness, midline scar, nd, bs+ Skin: no rash nor lesion Musculoskeletal: MAE, full ROM all 4 extremities Psychiatric: normal tone and affect Neurologic: AAOx3, grossly non-focal  Labs on Admission:  Basic Metabolic Panel:  Recent Labs Lab 09/24/12 1649 09/24/12 2048  NA 127*  --   K 4.6  --   CL 93*  --   CO2 10*  --   GLUCOSE 92  --   BUN 76*  --   CREATININE 10.76*  --   CALCIUM 7.9*  --   MG  --  2.3   Liver Function Tests:  Recent Labs Lab 09/24/12 1649  AST 16  ALT 12  ALKPHOS 117  BILITOT 0.2*  PROT 7.8  ALBUMIN 2.9*    Recent Labs Lab 09/24/12 1649  LIPASE 296*   No results found for this basename: AMMONIA,  in the last 168 hours CBC:  Recent Labs Lab 09/24/12 1649  WBC 10.4  NEUTROABS 8.0*  HGB  10.1*  HCT 29.9*  MCV 59.7*  PLT 372   Cardiac Enzymes:  Recent Labs Lab 09/24/12 2048  CKTOTAL 71    BNP (last 3 results) No results found for this basename: PROBNP,  in the last 8760 hours CBG: No results found for this basename: GLUCAP,  in the last 168 hours  Radiological Exams on Admission: No results found.  EKG: Independently reviewed.  Assessment/Plan Principal Problem:   Kidney transplant failure and rejection Active Problems:   ESRD (end stage renal disease)   Diabetes mellitus type I   H/O pancreas transplant   Pancreatitis of pancreas transplant   1. Renal failure in transplant patient - Likely subacute in nature with some acute component.  Does not appear volume depleted, getting bicarb gtt for acidosis, hope for some reversibility, Dr. Jimmy Footman is calling [facility] as patient may end up transferred to their facility.  Suspect that the majority of her problems are from transplant rejection due to not taking rejection meds.  Dr. Jimmy Footman has ordered high dose solumedrol as well as her transplant anti-rejection meds.  Unfortunately as we communicated with patient and family we suspect that patient may well wind up on dialysis in the next couple of days, likely permanently. 2. Pancreatitis - mild but suspect that this is the start of pancreatic transplant rejection as well, monitor CBGs Q6H for development of DM, add insulin if needed 3. HTN - monitor at this point 4. Anemia - monitor and checking iron studies but Suspect this relates to renal failure as well. 5. Acidosis - see #1 on bicarb gtt, likely due to renal failure  Dr. Jimmy Footman saw the patient in room with me, his note is already in the chart.  Code Status: Full Code (must indicate code status--if unknown or must be presumed, indicate so) Family Communication: Spoke with family at bedside (indicate person spoken with, if applicable, with phone number if by telephone) Disposition Plan: Admit to inpatient  (indicate anticipated LOS)  Time spent: 70 min  GARDNER, JARED M. [facility] Pager [phone]  If 7PM-7AM, please contact night-coverage [email or web address] Password [facility] 09/24/2012, 9:39 PM

## 2012-09-24 NOTE — ED Notes (Signed)
Pt presents to ED c/o vomiting x7 since yesterday morning. Pt states that she is unable to keep food or liquid down. Pt states that she had a fever yesterday that was relieved by tylenol. Pt had a kidney and pancreas transplant 05/24/2010.

## 2012-09-24 NOTE — ED Notes (Signed)
Unable to give report

## 2012-09-24 NOTE — Consult Note (Signed)
Reason for Consult:Renal Failure Referring Physician: ER  Priscilla [name] is an 42 y.o. female.  HPI: 42 yr old female with hx Type 1 Dm , was on PD and received KP Tx at [facility] 1/12.  Followed by Dr. Justin Mend in our office but has not been seen since 10/13.  Last seen @ [facility] 7/13.  Admits to not taking her Prograf and Myfortic regularly because of financial reasons.  On Sun had some stiff neck then last 48 h V X7, and abdm cramps from V.. No D, but not holding down fluids.  Poor energy, had fever of 100.1 on Tues.  No dysuria, still passing some urine, no hematuria, no cough, rash, ST, sores in mouth, dry eyes, dry mouth, edema, or SOB  Cr here 10 and ^ Lipase, Normal BS. Constitutional: as above, doesn't feel well Eyes: negative Ears, nose, mouth, throat, and face: negative Respiratory: negative Cardiovascular: negative Gastrointestinal: as above, no hematemesis Genitourinary:negative Integument/breast: negative Musculoskeletal:stiff neck lasted <24 h Neurological: negative Behavioral/Psych: admits to feeling bad about not taking meds Allergic/Immunologic: as above   Primary Nephrologist Dr. Justin Mend.   Past Medical History  Diagnosis Date  . H/O kidney transplant   . Pancreas transplanted   . Diabetes mellitus without complication   . Hypertension   . Renal disorder     Past Surgical History  Procedure Laterality Date  . Nephrectomy transplanted organ    . Combined kidney-pancreas transplant      No family history on file.  Social History:  reports that she has never smoked. She does not have any smokeless tobacco history on file. She reports that she does not drink alcohol or use illicit drugs.  Allergies: No Known Allergies  Medications: I have reviewed the patient's current medications. Prior to Admission:  (Not in a hospital admission)   Results for orders placed during the hospital encounter of 09/24/12 (from the past 48 hour(s))  CBC WITH DIFFERENTIAL      Status: Abnormal   Collection Time    09/24/12  4:49 PM      Result Value Range   WBC 10.4  4.0 - 10.5 K/uL   RBC 5.01  3.87 - 5.11 MIL/uL   Hemoglobin 10.1 (*) 12.0 - 15.0 g/dL   HCT 29.9 (*) 36.0 - 46.0 %   MCV 59.7 (*) 78.0 - 100.0 fL   MCH 20.2 (*) 26.0 - 34.0 pg   MCHC 33.8  30.0 - 36.0 g/dL   RDW 20.0 (*) 11.5 - 15.5 %   Platelets 372  150 - 400 K/uL   Neutrophils Relative % 77  43 - 77 %   Lymphocytes Relative 14  12 - 46 %   Monocytes Relative 6  3 - 12 %   Eosinophils Relative 2  0 - 5 %   Basophils Relative 1  0 - 1 %   Neutro Abs 8.0 (*) 1.7 - 7.7 K/uL   Lymphs Abs 1.5  0.7 - 4.0 K/uL   Monocytes Absolute 0.6  0.1 - 1.0 K/uL   Eosinophils Absolute 0.2  0.0 - 0.7 K/uL   Basophils Absolute 0.1  0.0 - 0.1 K/uL   RBC Morphology TARGET CELLS    LIPASE, BLOOD     Status: Abnormal   Collection Time    09/24/12  4:49 PM      Result Value Range   Lipase 296 (*) 11 - 59 U/L  COMPREHENSIVE METABOLIC PANEL     Status: Abnormal   Collection Time  09/24/12  4:49 PM      Result Value Range   Sodium 127 (*) 135 - 145 mEq/L   Potassium 4.6  3.5 - 5.1 mEq/L   Chloride 93 (*) 96 - 112 mEq/L   CO2 10 (*) 19 - 32 mEq/L   Comment: CRITICAL RESULT CALLED TO, READ BACK BY AND VERIFIED WITH:     B NORTON,RN 1801 09/24/12 D BRADLEY   Glucose, Bld 92  70 - 99 mg/dL   BUN 76 (*) 6 - 23 mg/dL   Creatinine, Ser 10.76 (*) 0.50 - 1.10 mg/dL   Calcium 7.9 (*) 8.4 - 10.5 mg/dL   Total Protein 7.8  6.0 - 8.3 g/dL   Albumin 2.9 (*) 3.5 - 5.2 g/dL   AST 16  0 - 37 U/L   ALT 12  0 - 35 U/L   Alkaline Phosphatase 117  39 - 117 U/L   Total Bilirubin 0.2 (*) 0.3 - 1.2 mg/dL   GFR calc non Af Amer 4 (*) >90 mL/min   GFR calc Af Amer 5 (*) >90 mL/min   Comment:            The eGFR has been calculated     using the CKD EPI equation.     This calculation has not been     validated in all clinical     situations.     eGFR's persistently     <90 mL/min signify     possible Chronic Kidney  Disease.  CG4 I-STAT (LACTIC ACID)     Status: None   Collection Time    09/24/12  6:56 PM      Result Value Range   Lactic Acid, Venous 1.01  0.5 - 2.2 mmol/L  POCT PREGNANCY, URINE     Status: None   Collection Time    09/24/12  8:33 PM      Result Value Range   Preg Test, Ur NEGATIVE  NEGATIVE   Comment:            THE SENSITIVITY OF THIS     METHODOLOGY IS >24 mIU/mL    No results found.  @ROS @ Blood pressure 163/94, pulse 106, temperature 98.2 F (36.8 C), temperature source Oral, resp. rate 20, last menstrual period 09/06/2012, SpO2 100.00%. @PHYSEXAMBYAGE2 @ Physical Examination: General appearance - chronically ill appearing Mental status - alert, oriented to person, place, and time Eyes - funduscopic exam old scarring, no fresh changes, discs flat and sharp Mouth - exudate noted on tongue Neck - adenopathy noted PCL Lymphatics - posterior cervical nodes Chest - clear to auscultation, no wheezes, rales or rhonchi, symmetric air entry Heart - normal rate, regular rhythm, normal S1, S2, no murmurs, rubs, clicks or gallops, S1 and S2 normal, systolic murmur Gr 2/6 at apex Abdomen - midline scar, old PD scars, Tx RLQ ? enlarged Neurological - alert, oriented, normal speech, no focal findings or movement disorder noted Musculoskeletal - no joint tenderness, deformity or swelling Extremities - peripheral pulses normal, no pedal edema, no clubbing or cyanosis Skin - pale  Assessment/Plan: 1 Renal Failure in Transplant assoc with pancreatic inflam  Suspect with level and severity this is rejection and CRF, as appears not all acute.  Clearly some acute component.  Not Vol deplete but acidemic so will give HCO3.  Hopefully some reversibility. Will contact [facility] in am as they wanted [place] to tx this pm.  Will give high dose steroids.  2 Pancreatitis suspect rejx 3 Hypertension: follow closely  4. Anemia monitor and check Fe 5. Metabolic Bone Disease: check PTH 6 Non adherence P as  above, get U/S, give SM, monitor acid base and Cr.  Kaelynne Christley L 09/24/2012, 8:36 PM

## 2012-09-24 NOTE — ED Provider Notes (Signed)
History     CSN: [id number]  Arrival date & time 09/24/12  1618   First MD Initiated Contact with Patient 09/24/12 1720      Chief Complaint  Patient presents with  . Emesis  . Fever    HPI 42 year old female with history of type 1 diabetes status post pancreas and renal transplant presents complaining of vomiting. She began to feel ill about 4 days ago. Initially her symptoms consisted of diffuse myalgias and fatigue. Over the last 48 hours she has developed nausea and vomiting. She's had nonbloody nonbilious emesis, about 7 episodes since yesterday morning. She reports that she had a low-grade fever about 3 days ago, but has not had fever or chills for the last 2 days. She's had urinary frequency but no urgency or dysuria. No flank pain or back pain. She's had no cough, no rhinorrhea or congestion, no diarrhea. She reports that she's had some very mild abdominal discomfort described as a cramping pain in her upper abdomen, but only when she vomits. She states that in general her abdomen does not hurt at all.  Initially, she reported that she was compliant with her immunosuppressive medications; however after further questioning by the internal medicine team she admits she's missed "several doses."   Past Medical History  Diagnosis Date  . H/O kidney transplant   . Pancreas transplanted   . Diabetes mellitus without complication   . Hypertension   . Renal disorder     Past Surgical History  Procedure Laterality Date  . Nephrectomy transplanted organ    . Combined kidney-pancreas transplant      Family History  Problem Relation Age of Onset  . CAD Neg Hx     History  Substance Use Topics  . Smoking status: Never Smoker   . Smokeless tobacco: Not on file  . Alcohol Use: No    OB History   Grav Para Term Preterm Abortions TAB SAB Ect Mult Living                  Review of Systems  Constitutional: Positive for fatigue. Negative for fever, chills and diaphoresis.   HENT: Negative for congestion, rhinorrhea, neck pain and neck stiffness.   Respiratory: Negative for cough, shortness of breath and wheezing.   Cardiovascular: Negative for chest pain and leg swelling.  Gastrointestinal: Positive for nausea and vomiting. Negative for abdominal pain and diarrhea.  Genitourinary: Negative for dysuria, urgency, frequency, flank pain, vaginal bleeding, vaginal discharge and difficulty urinating.  Skin: Negative for rash.  Neurological: Negative for weakness, numbness and headaches.  All other systems reviewed and are negative.    Allergies  Review of patient's allergies indicates no known allergies.  Home Medications   No current outpatient prescriptions on file.  BP 169/92  Pulse 103  Temp(Src) 98.7 F (37.1 C) (Oral)  Resp 20  Ht 5' (1.524 m)  Wt 136 lb 7.4 oz (61.9 kg)  BMI 26.65 kg/m2  SpO2 100%  LMP 09/06/2012  Physical Exam  Nursing note and vitals reviewed. Constitutional: She is oriented to person, place, and time. She appears well-developed and well-nourished. No distress.  HENT:  Head: Normocephalic and atraumatic.  Mouth/Throat: Oropharynx is clear and moist.  Eyes: Conjunctivae and EOM are normal. Pupils are equal, round, and reactive to light. No scleral icterus.  Neck: Normal range of motion. Neck supple. No JVD present.  Cardiovascular: Regular rhythm, normal heart sounds and intact distal pulses.  Tachycardia present.  Exam reveals no gallop  and no friction rub.   No murmur heard. Pulmonary/Chest: Effort normal and breath sounds normal. No respiratory distress. She has no wheezes. She has no rales.  Abdominal: Soft. Bowel sounds are normal. She exhibits no distension. There is no tenderness. There is no rebound and no guarding.  Musculoskeletal: She exhibits no edema.  Neurological: She is alert and oriented to person, place, and time. No cranial nerve deficit. She exhibits normal muscle tone. Coordination normal.  Skin: Skin  is warm and dry. She is not diaphoretic.    ED Course  Procedures (including critical care time)  Labs Reviewed  CBC WITH DIFFERENTIAL - Abnormal; Notable for the following:    Hemoglobin 10.1 (*)    HCT 29.9 (*)    MCV 59.7 (*)    MCH 20.2 (*)    RDW 20.0 (*)    Neutro Abs 8.0 (*)    All other components within normal limits  LIPASE, BLOOD - Abnormal; Notable for the following:    Lipase 296 (*)    All other components within normal limits  COMPREHENSIVE METABOLIC PANEL - Abnormal; Notable for the following:    Sodium 127 (*)    Chloride 93 (*)    CO2 10 (*)    BUN 76 (*)    Creatinine, Ser 10.76 (*)    Calcium 7.9 (*)    Albumin 2.9 (*)    Total Bilirubin 0.2 (*)    GFR calc non Af Amer 4 (*)    GFR calc Af Amer 5 (*)    All other components within normal limits  URINALYSIS, ROUTINE W REFLEX MICROSCOPIC - Abnormal; Notable for the following:    APPearance CLOUDY (*)    Hgb urine dipstick TRACE (*)    Protein, ur 100 (*)    Leukocytes, UA SMALL (*)    All other components within normal limits  URIC ACID - Abnormal; Notable for the following:    Uric Acid, Serum 9.5 (*)    All other components within normal limits  URINE MICROSCOPIC-ADD ON - Abnormal; Notable for the following:    Squamous Epithelial / LPF FEW (*)    Bacteria, UA FEW (*)    All other components within normal limits  GLUCOSE, CAPILLARY - Abnormal; Notable for the following:    Glucose-Capillary 109 (*)    All other components within normal limits  CULTURE, BLOOD (ROUTINE X 2)  CULTURE, BLOOD (ROUTINE X 2)  URINE CULTURE  MRSA PCR SCREENING  CK  MAGNESIUM  SODIUM, URINE, RANDOM  TACROLIMUS LEVEL  IRON AND TIBC  URINALYSIS, ROUTINE W REFLEX MICROSCOPIC  HEPATITIS B SURFACE ANTIGEN  PARATHYROID HORMONE, INTACT (NO CA)  RENAL FUNCTION PANEL  CG4 I-STAT (LACTIC ACID)  POCT PREGNANCY, URINE   US Renal Transplant W/doppler  09/24/2012   *RADIOLOGY REPORT*  Clinical Data:  Acute renal  insufficiency; vomiting.  Assess renal transplant.  ULTRASOUND OF RENAL TRANSPLANT  Technique: Ultrasound examination of the renal transplant was performed, with color and duplex Doppler evaluation.  Comparison:  None.  Findings:  Transplant kidney location:  RLQ  Transplant kidney description:  Normal in size and parenchymal echogenicity.  No evidence of mass or hydronephrosis.  It measures 12.7 cm in length.  Color flow in the main renal artery at the hilum:  Present  Color flow in the main renal vein at the hilum:  Present  Resistive indices:       Main renal artery:  0.77  Bladder:  Normal for degree of  bladder distention.  A pre-void volume of 99 mL is visualized within the bladder.  The patient is unable to void at this time.  Other findings:  None  IMPRESSION: Borderline resistive index within the main renal artery to the renal transplant, at 0.77.  This is in the grey zone between a normal resistive index below 0.7 and an abnormal resistive index above 0.9.  A single Doppler examination is unreliable in assessing for graft dysfunction or acute rejection; serial changes in Doppler RI would be more accurate in determining the need for tissue biopsy.   Original Report Authenticated By: Santa Lighter, M.D.     1. Kidney transplant failure and rejection   2. Diabetes mellitus type I   3. ESRD (end stage renal disease)   4. Pancreatitis of pancreas transplant       MDM  42 year old female here with 6 days of feeling ill with myalgias and fatigue, 2 days of severe nausea and vomiting. She has been noncompliant with her immunosuppressive meds rece vitals are pertinent for hypertension to 160s over 90, tachycardia to low 100s. She is nontoxic appearing, in no acute distress, and her exam is fairly unremarkable otherwise.  Labs are pertinent for creatinine 10.76, BUN 72, bicarbonate 10, sodium 127, hemoglobin 10.1, UA not suggestive of UTI, urine culture sent, Prograf level sent.  I spoke with her  transplant team at [facility] on the phone and they feel that she can be initially managed by her nephrologist here as this possibly represents severe dehydration and not necessarily rejection. Eye consult to nephrology and they agree that the patient can be admitted here to the internal medicine service, renal following. She may ultimately need renal biopsy if she does not improve. After renal evaluated her they feel that this likely represents rejection especially given her noncompliance and her severe metabolic derangements. She was given an IV fluid bolus in the emergency department and started on a bicarbonate drip at recommendation of nephrology. Stable emergency department course.            Wendall Papa, MD 09/25/12 [phone]

## 2012-09-24 NOTE — ED Notes (Signed)
Dr. Felecia Jan notified of critical CO2.

## 2012-09-25 ENCOUNTER — Encounter (HOSPITAL_COMMUNITY): Payer: Self-pay | Admitting: *Deleted

## 2012-09-25 DIAGNOSIS — Z9483 Pancreas transplant status: Secondary | ICD-10-CM

## 2012-09-25 LAB — IRON AND TIBC
Iron: 21 ug/dL — ABNORMAL LOW (ref 42–135)
Saturation Ratios: 5 % — ABNORMAL LOW (ref 20–55)
Saturation Ratios: 8 % — ABNORMAL LOW (ref 20–55)
TIBC: 274 ug/dL (ref 250–470)
UIBC: 272 ug/dL (ref 125–400)
UIBC: 253 ug/dL (ref 125–400)

## 2012-09-25 LAB — CBC
Hemoglobin: 9.3 g/dL — ABNORMAL LOW (ref 12.0–15.0)
MCH: 20.5 pg — ABNORMAL LOW (ref 26.0–34.0)
MCHC: 34.1 g/dL (ref 30.0–36.0)
Platelets: 336 10*3/uL (ref 150–400)
RBC: 4.54 MIL/uL (ref 3.87–5.11)

## 2012-09-25 LAB — GLUCOSE, CAPILLARY: Glucose-Capillary: 109 mg/dL — ABNORMAL HIGH (ref 70–99)

## 2012-09-25 LAB — COMPREHENSIVE METABOLIC PANEL
AST: 13 U/L (ref 0–37)
Albumin: 2.6 g/dL — ABNORMAL LOW (ref 3.5–5.2)
BUN: 73 mg/dL — ABNORMAL HIGH (ref 6–23)
Creatinine, Ser: 10.19 mg/dL — ABNORMAL HIGH (ref 0.50–1.10)
Total Protein: 6.9 g/dL (ref 6.0–8.3)

## 2012-09-25 LAB — DIFFERENTIAL
Basophils Relative: 0 % (ref 0–1)
Eosinophils Absolute: 0 10*3/uL (ref 0.0–0.7)
Eosinophils Relative: 0 % (ref 0–5)
Lymphocytes Relative: 9 % — ABNORMAL LOW (ref 12–46)
Monocytes Absolute: 0.1 10*3/uL (ref 0.1–1.0)
Neutrophils Relative %: 89 % — ABNORMAL HIGH (ref 43–77)

## 2012-09-25 LAB — RENAL FUNCTION PANEL
CO2: 14 mEq/L — ABNORMAL LOW (ref 19–32)
Calcium: 7.1 mg/dL — ABNORMAL LOW (ref 8.4–10.5)
GFR calc Af Amer: 5 mL/min — ABNORMAL LOW (ref >90)
GFR calc non Af Amer: 4 mL/min — ABNORMAL LOW (ref >90)
Glucose, Bld: 99 mg/dL (ref 70–99)
Potassium: 4.1 mEq/L (ref 3.5–5.1)
Sodium: 133 mEq/L — ABNORMAL LOW (ref 135–145)

## 2012-09-25 LAB — MRSA PCR SCREENING: MRSA by PCR: NEGATIVE

## 2012-09-25 LAB — PHOSPHORUS: Phosphorus: 5.9 mg/dL — ABNORMAL HIGH (ref 2.3–4.6)

## 2012-09-25 LAB — LIPASE, BLOOD: Lipase: 190 U/L — ABNORMAL HIGH (ref 11–59)

## 2012-09-25 LAB — AMYLASE: Amylase: 96 U/L (ref 0–105)

## 2012-09-25 MED ORDER — HEPARIN SODIUM (PORCINE) 5000 UNIT/ML IJ SOLN: 5000.0000 [IU] | Freq: Three times a day (TID) | INTRAMUSCULAR | Status: DC

## 2012-09-25 MED ORDER — ACETAMINOPHEN 325 MG PO TABS: 650.0000 mg | ORAL_TABLET | ORAL | Status: DC | PRN

## 2012-09-25 MED ORDER — SODIUM CHLORIDE 0.9 % IV SOLN: 500.0000 mg | INTRAVENOUS | Status: DC

## 2012-09-25 MED ORDER — SODIUM BICARBONATE 8.4 % IV SOLN: 100.0000 mL/h | INTRAVENOUS | Status: DC

## 2012-09-25 MED ORDER — SODIUM CHLORIDE 0.9 % IJ SOLN: 3.0000 mL | Freq: Two times a day (BID) | INTRAMUSCULAR | Status: DC

## 2012-09-25 MED ORDER — FERUMOXYTOL INJECTION 510 MG/17 ML
510.0000 mg | Freq: Once | INTRAVENOUS | Status: AC
  Administered 2012-09-25: 510 mg via INTRAVENOUS
  Filled 2012-09-25: qty 17

## 2012-09-25 MED ORDER — ACETAMINOPHEN 325 MG PO TABS
650.0000 mg | ORAL_TABLET | ORAL | Status: DC | PRN
  Administered 2012-09-25: 650 mg via ORAL

## 2012-09-25 MED ORDER — LORAZEPAM 0.5 MG PO TABS
0.5000 mg | ORAL_TABLET | Freq: Once | ORAL | Status: AC
  Administered 2012-09-25: 0.5 mg via ORAL
  Filled 2012-09-25: qty 1

## 2012-09-25 MED ORDER — PROMETHAZINE HCL 12.5 MG PO TABS: 12.5000 mg | ORAL_TABLET | Freq: Four times a day (QID) | ORAL | Status: DC | PRN

## 2012-09-25 NOTE — Discharge Summary (Signed)
Physician Discharge Summary  Clarivel Westrum [id number] DOB: [birth year] DOA: 09/24/2012  PCP: [name], MD  Admit date: 09/24/2012 Discharge date: 09/25/2012  Time spent: 50 minutes  Discharge Diagnoses:  Principal Problem:   Kidney transplant failure and rejection Active Problems:   ESRD (end stage renal disease)   Diabetes mellitus type I   H/O pancreas transplant   Pancreatitis of pancreas transplant   Discharge Condition: stable  Diet recommendation: as tolerated  Filed Weights   09/24/12 2322  Weight: 61.9 kg (136 lb 7.4 oz)    History of present illness:  Priscilla [name] is a 42 y.o. female with type 1 DM, who developed ESRD and received a kidney/pancreas transplant at [facility] 04/2010. She is followed by Dr. Justin Mend but hadnt been seen in office since 10/13. Patient admits to not taking prograf and myfortic regularly recently because of financial reasons she states. On Sunday she had stiff neck and began to feel ill, which became worse until she developed N/V for the past 24 hours or so with 7 episodes of vomiting. Abdominal cramps from vomiting. Fever of 100.1 on Tuesday. No dysuria, is still passing some urine. Dosent feel overly dehydrated. Because of these symptoms she presented to the ED.  In the ED the patients labs demonstrated lipase of 290, creatinine > 10, bicarb 10, BUN 70s. Emergency room spoke with the transplant team at [facility] who recommended admission to [facility] in case this was renal failure from dehydration. Dr. Jimmy Footman was consulted and hospitalist asked to admit.   Hospital Course:  Patient was started on a bicarbonate drip after fluid bolus. Dr. Hassell Done, nephrology has spoken with the transplant team at [facility] and patient will be transferred to the transplant team there. She has remained stable overnight.  Procedures:  none  Consultations:  nephrology  Discharge Exam: Filed Vitals:   09/24/12 2227 09/24/12 2322 09/25/12  0540 09/25/12 0905  BP:  169/92 152/82 165/94  Pulse: 103 103 99 97  Temp: 98.7 F (37.1 C) 98.7 F (37.1 C) 98.5 F (36.9 C) 98.4 F (36.9 C)  TempSrc:  Oral Oral Oral  Resp: 22 20 18 18   Height:  5' (1.524 m)    Weight:  61.9 kg (136 lb 7.4 oz)    SpO2: 100% 100% 98% 95%    General: comfortable, nontoxic Cardiovascular: RRR without MGR Respiratory: CTA without WRR Abd S, NT, ND Ext no cce  Discharge Instructions  Discharge Orders   Future Orders Complete By Expires     Activity as tolerated - No restrictions  As directed         Medication List    STOP taking these medications       aspirin 325 MG tablet     ibuprofen 200 MG tablet  Commonly known as:  ADVIL,MOTRIN     predniSONE 5 MG tablet  Commonly known as:  DELTASONE     SIMVASTATIN PO     sulfamethoxazole-trimethoprim 800-160 MG per tablet  Commonly known as:  BACTRIM DS,SEPTRA DS      TAKE these medications       acetaminophen 325 MG tablet  Commonly known as:  TYLENOL  Take 2 tablets (650 mg total) by mouth every 4 (four) hours as needed.     dextrose 5 % SOLN 850 mL with sodium bicarbonate 1 mEq/mL SOLN 150 mEq  Inject 100 mL/hr into the vein continuous.     heparin 5000 UNIT/ML injection  Inject 1 mL (5,000 Units total) into the  skin every 8 (eight) hours.     MYFORTIC 180 MG EC tablet  Generic drug:  mycophenolate  Take 360 mg by mouth 2 (two) times daily.     promethazine 12.5 MG tablet  Commonly known as:  PHENERGAN  Take 1 tablet (12.5 mg total) by mouth every 6 (six) hours as needed.     sodium chloride 0.9 % injection  Inject 3 mLs into the vein every 12 (twelve) hours.     sodium chloride 0.9 % SOLN 50 mL with methylPREDNISolone sodium succinate 1000 MG SOLR 500 mg  Inject 500 mg into the vein daily.     tacrolimus 1 MG capsule  Commonly known as:  PROGRAF  Take 3-4 mg by mouth 2 (two) times daily. 4 tablets in morning, and 3 tablets in evening, 7 total tablets daily.        Allergies  Allergen Reactions  . Pork-Derived Products     Pt is Muslum      The results of significant diagnostics from this hospitalization (including imaging, microbiology, ancillary and laboratory) are listed below for reference.    Significant Diagnostic Studies: US Renal Transplant W/doppler  09/24/2012   *RADIOLOGY REPORT*  Clinical Data:  Acute renal insufficiency; vomiting.  Assess renal transplant.  ULTRASOUND OF RENAL TRANSPLANT  Technique: Ultrasound examination of the renal transplant was performed, with color and duplex Doppler evaluation.  Comparison:  None.  Findings:  Transplant kidney location:  RLQ  Transplant kidney description:  Normal in size and parenchymal echogenicity.  No evidence of mass or hydronephrosis.  It measures 12.7 cm in length.  Color flow in the main renal artery at the hilum:  Present  Color flow in the main renal vein at the hilum:  Present  Resistive indices:       Main renal artery:  0.77  Bladder:  Normal for degree of bladder distention.  A pre-void volume of 99 mL is visualized within the bladder.  The patient is unable to void at this time.  Other findings:  None  IMPRESSION: Borderline resistive index within the main renal artery to the renal transplant, at 0.77.  This is in the grey zone between a normal resistive index below 0.7 and an abnormal resistive index above 0.9.  A single Doppler examination is unreliable in assessing for graft dysfunction or acute rejection; serial changes in Doppler RI would be more accurate in determining the need for tissue biopsy.   Original Report Authenticated By: Santa Lighter, M.D.    Microbiology: Recent Results (from the past 240 hour(s))  CULTURE, BLOOD (ROUTINE X 2)     Status: None   Collection Time    09/24/12  4:55 PM      Result Value Range Status   Specimen Description BLOOD ARM RIGHT   Final   Special Requests BOTTLES DRAWN AEROBIC ONLY 6CC   Final   Culture  Setup Time 09/24/2012 21:12   Final    Culture     Final   Value:        BLOOD CULTURE RECEIVED NO GROWTH TO DATE CULTURE WILL BE HELD FOR 5 DAYS BEFORE ISSUING A FINAL NEGATIVE REPORT   Report Status PENDING   Incomplete  CULTURE, BLOOD (ROUTINE X 2)     Status: None   Collection Time    09/24/12  4:59 PM      Result Value Range Status   Specimen Description BLOOD RIGHT FOREARM   Final   Special Requests BOTTLES DRAWN AEROBIC  ONLY 3CC   Final   Culture  Setup Time 09/24/2012 21:12   Final   Culture     Final   Value:        BLOOD CULTURE RECEIVED NO GROWTH TO DATE CULTURE WILL BE HELD FOR 5 DAYS BEFORE ISSUING A FINAL NEGATIVE REPORT   Report Status PENDING   Incomplete  MRSA PCR SCREENING     Status: None   Collection Time    09/24/12 11:17 PM      Result Value Range Status   MRSA by PCR NEGATIVE  NEGATIVE Final   Comment:            The GeneXpert MRSA Assay (FDA     approved for NASAL specimens     only), is one component of a     comprehensive MRSA colonization     surveillance program. It is not     intended to diagnose MRSA     infection nor to guide or     monitor treatment for     MRSA infections.     Labs: Basic Metabolic Panel:  Recent Labs Lab 09/24/12 1649 09/24/12 2048 09/24/12 2349 09/25/12 0525  NA 127*  --  133* 131*  K 4.6  --  4.1 4.5  CL 93*  --  96 94*  CO2 10*  --  14* 17*  GLUCOSE 92  --  99 164*  BUN 76*  --  71* 73*  CREATININE 10.76*  --  10.00* 10.19*  CALCIUM 7.9*  --  7.1* 7.0*  MG  --  2.3  --   --   PHOS  --   --  5.7* 5.9*   Liver Function Tests:  Recent Labs Lab 09/24/12 1649 09/24/12 2349 09/25/12 0525  AST 16  --  13  ALT 12  --  10  ALKPHOS 117  --  101  BILITOT 0.2*  --  0.3  PROT 7.8  --  6.9  ALBUMIN 2.9* 2.7* 2.6*    Recent Labs Lab 09/24/12 1649 09/25/12 0525  LIPASE 296* 190*  AMYLASE  --  96   No results found for this basename: AMMONIA,  in the last 168 hours CBC:  Recent Labs Lab 09/24/12 1649 09/25/12 0525  WBC 10.4 7.1  NEUTROABS  8.0* 6.4  HGB 10.1* 9.3*  HCT 29.9* 27.3*  MCV 59.7* 60.1*  PLT 372 336   Cardiac Enzymes:  Recent Labs Lab 09/24/12 2048  CKTOTAL 71   BNP: BNP (last 3 results) No results found for this basename: PROBNP,  in the last 8760 hours CBG:  Recent Labs Lab 09/25/12 0006 09/25/12 0602  GLUCAP 109* 163*   Signed:  [place] L  [facility] 09/25/2012, 10:55 AM

## 2012-09-25 NOTE — Progress Notes (Signed)
[facility]  Subjective:  Much less nausea (got solumedrol last night).  She says she took transplant meds "about half of the time--cost too much."   Objective: Vital signs in last 24 hours: Blood pressure 165/94, pulse 97, temperature 98.4 F (36.9 C), temperature source Oral, resp. rate 18, height 5' (1.524 m), weight 61.9 kg (136 lb 7.4 oz), last menstrual period 09/06/2012, SpO2 95.00%.    PHYSICAL EXAM General--awake, alert, freindly Chest--clear Heart--systolic murmur, no rub Abd--nontender Extr--no edema  Lab Results:   Recent Labs Lab 09/24/12 1649 09/24/12 2349 09/25/12 0525  NA 127* 133* 131*  K 4.6 4.1 4.5  CL 93* 96 94*  CO2 10* 14* 17*  BUN 76* 71* 73*  CREATININE 10.76* 10.00* 10.19*  GLUCOSE 92 99 164*  CALCIUM 7.9* 7.1* 7.0*  PHOS  --  5.7* 5.9*     Recent Labs  09/24/12 1649 09/25/12 0525  WBC 10.4 7.1  HGB 10.1* 9.3*  HCT 29.9* 27.3*  PLT 372 336     I have reviewed the patient's current medications. Scheduled: . aspirin  325 mg Oral Daily  . heparin  5,000 Units Subcutaneous Q8H  . methylPREDNISolone (SOLU-MEDROL) injection  500 mg Intravenous Q24H  . mycophenolate  360 mg Oral BID  . sodium chloride  3 mL Intravenous Q12H  . tacrolimus  4 mg Oral BID   Continuous: .  sodium bicarbonate  infusion 1000 mL 100 mL/hr at 09/24/12 2249    Assessment/Plan: 1. Renal failure / chronic (irreveersible) vs acute.  No need for dialysis today.  Spoke with Dr. Roxy Horseman at [facility] who has accepted pt in transfer to see if anything is reversible 2. Pancreatic transplant (? If this is rejecting) with high glucose and lipase-see 1. 3. High BP--begin amlodipine if BP remains up 4. Anemia-- Fe/TIBC 8% sat, ferritin 28.  Will rx feraheme 510 mg IV today 5. Metabolic bone disease--Ca 7, phos 5.9, PTH 282.  No phos binders yet  Plan to transfer to [facility] later today.     LOS: 1 day   Rosselyn Martha F 09/25/2012,12:56 PM   .labalb

## 2012-09-25 NOTE — Progress Notes (Signed)
Utilization review completed.  

## 2012-09-26 LAB — URINE CULTURE

## 2012-09-28 HISTORY — PX: INSERTION OF DIALYSIS CATHETER: SHX1324

## 2012-09-28 LAB — GLUCOSE, CAPILLARY: Glucose-Capillary: 134 mg/dL — ABNORMAL HIGH (ref 70–99)

## 2012-09-29 NOTE — ED Provider Notes (Signed)
I saw and evaluated the patient, reviewed the resident's note and I agree with the findings and plan.   .Face to face Exam:  General:  Awake HEENT:  Atraumatic Resp:  Normal effort Abd:  Nondistended Neuro:No focal weakness   Dot Lanes, MD 09/29/12 1955

## 2012-09-30 LAB — CULTURE, BLOOD (ROUTINE X 2): Culture: NO GROWTH

## 2012-11-04 ENCOUNTER — Other Ambulatory Visit: Payer: Self-pay | Admitting: *Deleted

## 2012-11-04 DIAGNOSIS — N186 End stage renal disease: Secondary | ICD-10-CM

## 2012-11-04 DIAGNOSIS — Z01810 Encounter for preprocedural cardiovascular examination: Secondary | ICD-10-CM

## 2012-12-03 ENCOUNTER — Encounter: Payer: Self-pay | Admitting: Vascular Surgery

## 2012-12-04 ENCOUNTER — Ambulatory Visit: Payer: Medicare Other | Admitting: Vascular Surgery

## 2012-12-10 ENCOUNTER — Encounter: Payer: Self-pay | Admitting: Vascular Surgery

## 2012-12-11 ENCOUNTER — Ambulatory Visit: Payer: Medicare Other | Admitting: Vascular Surgery

## 2013-02-17 ENCOUNTER — Other Ambulatory Visit: Payer: Self-pay | Admitting: Vascular Surgery

## 2013-02-17 DIAGNOSIS — N186 End stage renal disease: Secondary | ICD-10-CM

## 2013-02-17 DIAGNOSIS — Z01810 Encounter for preprocedural cardiovascular examination: Secondary | ICD-10-CM

## 2013-02-18 ENCOUNTER — Other Ambulatory Visit: Payer: Self-pay | Admitting: *Deleted

## 2013-02-18 DIAGNOSIS — N186 End stage renal disease: Secondary | ICD-10-CM

## 2013-02-18 DIAGNOSIS — Z01810 Encounter for preprocedural cardiovascular examination: Secondary | ICD-10-CM

## 2013-03-04 ENCOUNTER — Encounter: Payer: Self-pay | Admitting: Vascular Surgery

## 2013-03-05 ENCOUNTER — Ambulatory Visit (HOSPITAL_COMMUNITY)
Admission: RE | Admit: 2013-03-05 | Discharge: 2013-03-05 | Disposition: A | Discharge status: Home / Self Care | Payer: PRIVATE HEALTH INSURANCE | Source: Ambulatory Visit | Attending: Vascular Surgery | Admitting: Vascular Surgery

## 2013-03-05 ENCOUNTER — Encounter (INDEPENDENT_AMBULATORY_CARE_PROVIDER_SITE_OTHER): Payer: Self-pay

## 2013-03-05 ENCOUNTER — Ambulatory Visit (INDEPENDENT_AMBULATORY_CARE_PROVIDER_SITE_OTHER): Payer: PRIVATE HEALTH INSURANCE | Admitting: Vascular Surgery

## 2013-03-05 ENCOUNTER — Ambulatory Visit (INDEPENDENT_AMBULATORY_CARE_PROVIDER_SITE_OTHER)
Admission: RE | Admit: 2013-03-05 | Discharge: 2013-03-05 | Disposition: A | Discharge status: Home / Self Care | Payer: PRIVATE HEALTH INSURANCE | Source: Ambulatory Visit | Attending: Vascular Surgery | Admitting: Vascular Surgery

## 2013-03-05 ENCOUNTER — Encounter: Payer: Self-pay | Admitting: Vascular Surgery

## 2013-03-05 VITALS — BP 151/79 | HR 96 | Ht 60.0 in | Wt 131.0 lb

## 2013-03-05 DIAGNOSIS — N186 End stage renal disease: Secondary | ICD-10-CM | POA: Insufficient documentation

## 2013-03-05 DIAGNOSIS — T8610 Unspecified complication of kidney transplant: Secondary | ICD-10-CM

## 2013-03-05 DIAGNOSIS — T8611 Kidney transplant rejection: Secondary | ICD-10-CM

## 2013-03-05 DIAGNOSIS — Z01810 Encounter for preprocedural cardiovascular examination: Secondary | ICD-10-CM | POA: Insufficient documentation

## 2013-03-05 NOTE — Progress Notes (Signed)
[facility] [place]  Referred by:  [name], MD [address] [place], [place] [address]  Reason for referral: New access  History of Present Illness  Priscilla [name] is a 42 y.o. ([birth year]) female s/p kidney and pancreas transplant who presents for evaluation for permanent access.  The patient is right hand dominant.  Recently, her kidney transplant failed and required a RIJ TDC placement.  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  She has previously been on peritoneal dialysis, which she favors.  Past Medical History  Diagnosis Date  . H/O kidney transplant   . Pancreas transplanted   . Diabetes mellitus without complication   . Hypertension   . Renal disorder     Past Surgical History  Procedure Laterality Date  . Nephrectomy transplanted organ    . Combined kidney-pancreas transplant      History   Social History  . Marital Status: Single    Spouse Name: N/A    Number of Children: N/A  . Years of Education: N/A   Occupational History  . Not on file.   Social History Main Topics  . Smoking status: Never Smoker   . Smokeless tobacco: Never Used  . Alcohol Use: No  . Drug Use: No  . Sexual Activity: Yes    Birth Control/ Protection: None   Other Topics Concern  . Not on file   Social History Narrative  . No narrative on file    Family History  Problem Relation Age of Onset  . CAD Neg Hx     Current Outpatient Prescriptions on File Prior to Visit  Medication Sig Dispense Refill  . acetaminophen (TYLENOL) 325 MG tablet Take 2 tablets (650 mg total) by mouth every 4 (four) hours as needed.      . heparin 5000 UNIT/ML injection Inject 1 mL (5,000 Units total) into the skin every 8 (eight) hours.  1 mL    . mycophenolate (MYFORTIC) 180 MG EC tablet Take 360 mg by mouth 2 (two) times daily.       . promethazine (PHENERGAN) 12.5 MG tablet Take 1 tablet (12.5 mg total) by mouth every 6 (six)  hours as needed.  30 tablet  0  . sodium chloride 0.9 % injection Inject 3 mLs into the vein every 12 (twelve) hours.  5 mL    . sodium chloride 0.9 % SOLN 50 mL with methylPREDNISolone sodium succinate 1000 MG SOLR 500 mg Inject 500 mg into the vein daily.      . tacrolimus (PROGRAF) 1 MG capsule Take 3-4 mg by mouth 2 (two) times daily. 4 tablets in morning, and 3 tablets in evening, 7 total tablets daily.      Marland Kitchen dextrose 5 % SOLN 850 mL with sodium bicarbonate 1 mEq/mL SOLN 150 mEq Inject 100 mL/hr into the vein continuous.    0   No current facility-administered medications on file prior to visit.    Allergies  Allergen Reactions  . Pork-Derived Products     Pt is Muslum     REVIEW OF SYSTEMS:  (Positives checked otherwise negative)  CARDIOVASCULAR:  []  chest pain, []  chest pressure, []  palpitations, []  shortness of breath when laying flat, []  shortness of breath with exertion,  []  pain in feet when walking, []  pain in feet when laying flat, []  history of blood clot in veins (DVT), []  history of phlebitis, []  swelling in legs, []  varicose veins  PULMONARY:  []  productive  cough, []  asthma, []  wheezing  NEUROLOGIC:  []  weakness in arms or legs, []  numbness in arms or legs, []  difficulty speaking or slurred speech, []  temporary loss of vision in one eye, []  dizziness  HEMATOLOGIC:  []  bleeding problems, []  problems with blood clotting too easily  MUSCULOSKEL:  []  joint pain, []  joint swelling  GASTROINTEST:  []  vomiting blood, []  blood in stool     GENITOURINARY:  []  burning with urination, []  blood in urine, [x]  s/p kidney and pancreas transplant  PSYCHIATRIC:  []  history of major depression  INTEGUMENTARY:  []  rashes, []  ulcers  CONSTITUTIONAL:  []  fever, []  chills  Physical Examination  Filed Vitals:   03/05/13 1538  BP: 151/79  Pulse: 96  Height: 5' (1.524 m)  Weight: 131 lb (59.421 kg)  SpO2: 100%   Body mass index is 25.58 kg/(m^2).  General: A&O x 3, WD,  thin  Head: /AT  Ear/Nose/Throat: Hearing grossly intact, nares w/o erythema or drainage, oropharynx w/o Erythema/Exudate, Mallampati score: 2  Eyes: PERRLA, EOMI  Neck: Supple, no nuchal rigidity, no palpable LAD  Pulmonary: Sym exp, good air movt, CTAB, no rales, rhonchi, & wheezing  Cardiac: RRR, Nl S1, S2, no Murmurs, rubs or gallops  Vascular: Vessel Right Left  Radial Palpable Palpable  Ulnar Palpable Palpable  Brachial Palpable Palpable  Carotid Palpable, without bruit Palpable, without bruit  Aorta Not palpable N/A  Femoral Palpable Palpable  Popliteal Not palpable Not palpable  PT Palpable Palpable  DP Palpable Palpable   Gastrointestinal: soft, NTND, -G/R, - HSM, - masses, - CVAT B  Musculoskeletal: M/S 5/5 throughout , Extremities without ischemic changes   Neurologic: CN 2-12 intact , Pain and light touch intact in extremities , Motor exam as listed above  Psychiatric: Judgment intact, Mood & affect appropriate for pt's clinical situation  Dermatologic: See M/S exam for extremity exam, no rashes otherwise noted  Lymph : No Cervical, Axillary, or Inguinal lymphadenopathy   Non-Invasive Vascular Imaging  Vein Mapping  (Date: 03/05/2013):   R arm: acceptable vein conduits include none including brachial vein  L arm: acceptable vein conduits include none including brachial vein  BUE arterial doppler (03/05/2013)  R: triphasic brachial and ulnar artery, incomplete arch interconnection on Allen testing  L: triphasic throughout, normal Allen testing  Outside Studies/Documentation 10 pages of outside documents were reviewed including: outpatient nephrology chart.  Medical Decision Making  Priscilla [name] is a 42 y.o. female who presents with ESRD requiring hemodialysis due to failed kidney transplant.   Based on vein mapping and examination, this patient's permanent access options include: B UA graft placement.  In my experience, young  patients do poorly with AVG due to intimal hyperplasia ingrowth at the venous anastomosis.  I'm not certain if her continued immunosuppression might attenuate this.  Regardless at this point, this patient prefers peritoneal dialysis, so I would defer to General Surgery for placement.  Thank you for the consult.  Adele Barthel, MD [facility] [place] Office: [phone] Pager: [phone]  03/05/2013, 3:52 PM

## 2013-07-07 ENCOUNTER — Other Ambulatory Visit (HOSPITAL_COMMUNITY): Payer: Self-pay | Admitting: Nephrology

## 2013-07-07 DIAGNOSIS — N23 Unspecified renal colic: Secondary | ICD-10-CM

## 2013-07-07 DIAGNOSIS — T8619 Other complication of kidney transplant: Principal | ICD-10-CM

## 2013-07-12 ENCOUNTER — Ambulatory Visit (HOSPITAL_COMMUNITY): Payer: PRIVATE HEALTH INSURANCE

## 2013-07-23 ENCOUNTER — Ambulatory Visit (HOSPITAL_COMMUNITY): Payer: Medicare Other

## 2013-08-16 ENCOUNTER — Ambulatory Visit (INDEPENDENT_AMBULATORY_CARE_PROVIDER_SITE_OTHER): Payer: Self-pay | Admitting: Surgery

## 2013-08-18 ENCOUNTER — Encounter (INDEPENDENT_AMBULATORY_CARE_PROVIDER_SITE_OTHER): Payer: Self-pay | Admitting: Surgery

## 2013-08-18 ENCOUNTER — Ambulatory Visit (INDEPENDENT_AMBULATORY_CARE_PROVIDER_SITE_OTHER): Payer: No Typology Code available for payment source | Admitting: Surgery

## 2013-08-18 VITALS — BP 140/75 | HR 77 | Temp 98.3°F | Resp 16 | Ht 60.0 in | Wt 115.0 lb

## 2013-08-18 DIAGNOSIS — N186 End stage renal disease: Secondary | ICD-10-CM

## 2013-08-18 DIAGNOSIS — Z9483 Pancreas transplant status: Secondary | ICD-10-CM

## 2013-08-18 DIAGNOSIS — T8611 Kidney transplant rejection: Secondary | ICD-10-CM

## 2013-08-18 DIAGNOSIS — T8610 Unspecified complication of kidney transplant: Secondary | ICD-10-CM

## 2013-08-18 DIAGNOSIS — T8612 Kidney transplant failure: Secondary | ICD-10-CM

## 2013-08-18 NOTE — Patient Instructions (Signed)
Peritoneal Dialysis (CAPD) Catheter  Healthy kidneys remove excess water and waste products from the body in the form of urine. When the kidneys cannot do this, serious problems develop and a person can fall into kidney failure.   In most cases, the kidneys can heal and recover to a better place.  Sometimes the kidneys remain somewhat damaged and a kidney specialist (nephrologist) needs to help manage the disease with medications and adjustments in diet.    Sometimes the kidney failure has progressed too far.  The body's waste and fluid build up in the blood. Hands and legs swell. You start to feel more weak or nauseated. Your blood pressure may rise, to seeing you at serious risk for heart attack and stroke.  If not treated, you will eventually die. Dialysis is a treatment that replaces the work that your kidneys would do if they were healthy to help keep you alive.  Dialysis can be done using a machine outside of the body (hemodialysis) connected to your blood; or, it can be done with a tube placed to enter your abdomen (peritoneal dialysis). The peritoneum is the inner lining of your abdominal cavity, covering the inner organs & abdominal wall.  This inner lining of peritoneum works like a filter.   Peritoneal dialysis involves placed several quarts of sterile fluid into the inner abdomen/peritoneal cavity.  The waste products and fluids can exchange across the peritoneal membrane.  The fluid is later removed.  This most poor every day.  Sometimes the catheter can be attached to a machine that runs at night while you sleep (continuous cycler-assisted dialysis = CCPD).  Sometimes the fluid can be infused in her abdomen and then later removed hours later (continuous ambulatory peritoneal dialysis = CAPD).  If your nephrologist feels like you are a good candidate, you will be sent to be evaluated by a surgeon to consider placement of a peritoneal dialysis catheter.  Your nephrologist should have shown you  educational videos .  You should discuss with dialysis nurses about what life with a peritoneal dialysis catheter is like.  Most patients who have not had many abdominal operations are reasonable candidates for placement of a peritoneal dialysis catheter   It is important to understand the risks and benefits of the procedure prior to undergoing surgery.    If your surgeon feels you are a reasonable candidate, you will have a peritoneal dialysis catheter placed.    This requires an operation under general anesthesia.  The surgeon places a soft plastic tube ([place] curl catheter) into your abdomen.  The deeper curled tip rests down in the pelvis.  The middle part is tunneled inside your abdominal wall where cuffs provide a water-tight seal.  This leaves an outer foot of plastic tubing resting outside your body, usually in your lower abdomen below the beltline near your waist.  This is a quick procedure that can often be done as an outpatient with the possibility of staying overnight.  It takes several weeks for the catheter to heal into a watertight seal.  Initially, your nephrologist we will have the dialysis nurses do gentle flushes through the catheter.  Once the catheter is well sealed in, you will begin larger exchanges of fluid to do full peritoneal dialysis.  Your nephrologist and dialysis nurses will help guide you through this.  While placement of the catheter is usually not technically difficult, there are risks to the procedure.  The biggest risk is infection.  This is why we place  the catheter in the operating room with the use of intravenous antibiotics.  Using sterile technique to hook up the catheter to your dialysis fluids is essential.  Most infections of the catheter are mild and can be treated with antibiotics placed in the vein were placed with the peritoneal fluid.  However, sometimes the infection worsens or persists and the catheter needs to be removed.  Occasionally, the catheters can  be blocked due to inner organs plugging up the tubing or kinks.  Sometimes the catheter leaks and needs to be replaced.  Sometimes the inner abdomen has too many adhesions from prior surgeries or infections and there is not enough space for fluid exchanges to occur.  Sometimes, peritoneal dialysis is not enough to compensate for the kidney failure.   In rare instances, the inner lining of the abdomen becomes severely thickened or inflamed and peritoneal dialysis can no longer work.  Sometimes it is not possible to safely replace a new catheter and the patient must go on hemodialysis permanently.  It is very common to need hemodialysis intermittently even if you have a peritoneal dialysis catheter in cases of emergencies or if peritoneal dialysis fails.  Your nephrologist and surgeon can help you decide what the best form of dialysis is for you  PERITONEAL DIALYSIS (CAPD) CATHETER PLACEMENT:  POST OPERATIVE INSTRUCTIONS  1. DIET: Follow a light bland diet the first 24 hours after arrival home, such as soup, liquids, crackers, etc.  Be sure to include lots of fluids daily.  Avoid fast food or heavy meals as your are more likely to get nauseated.   2. Take your usually prescribed home medications unless otherwise directed. 3. PAIN CONTROL: a. Pain is best controlled by a usual combination of three different methods TOGETHER: i. Ice/Heat ii. Tylenol (over the counter pain medication) iii. Prescription pain medication b. Most patients will experience some swelling and bruising around the incisions.  Ice packs or heating pads (30-60 minutes up to 6 times a day) will help. Use ice for the first few days to help decrease swelling and bruising, then switch to heat to help relax tight/sore spots and speed recovery.  Some people prefer to use ice alone, heat alone, alternating between ice & heat.  Experiment to what works for you.  Swelling and bruising can take several weeks to resolve.   c. It is helpful to  take an over-the-counter pain medication regularly for the first few weeks.  Using acetaminophen (Tylenol, etc) 500-650mg  four times a day (every meal & bedtime) is usually safest since NSAIDs are not advisable in patients with kidney disease. d. A  prescription for pain medication (such as oxycodone, hydrocodone, etc) should be given to you upon discharge.  Take your pain medication as prescribed.  i. If you are having problems/concerns with the prescription medicine (does not control pain, nausea, vomiting, rash, itching, etc), please call us [phone] to see if we need to switch you to a different pain medicine that will work better for you and/or control your side effect better. ii. If you need a refill on your pain medication, please contact your pharmacy.  They will contact our office to request authorization. Prescriptions will not be filled after 5 pm or on week-ends. 4. Avoid getting constipated.  Between the surgery and the pain medications, it is common to experience some constipation.  Increasing fluid intake and taking a fiber supplement (such as Metamucil, Citrucel, FiberCon, MiraLax, etc) 1-2 times a day regularly will usually help  prevent this problem from occurring.  A mild laxative (prune juice, Milk of Magnesia, MiraLax, etc) should be taken according to package directions if there are no bowel movements after 48 hours.   5. Wash / shower every day.  You may shower over the dressings as they are waterproof.  Continue to shower over incision(s) after the dressing is off. 6. The Peritoneal Dialysis nurse will remove your waterproof bandages in the Dialysis Center a few days after surgery.  Do not remove the bandages until seen by them. 7. ACTIVITIES as tolerated:   a. You may resume regular (light) daily activities beginning the next day-such as daily self-care, walking, climbing stairs-gradually increasing activities as tolerated.  If you can walk 30 minutes without difficulty, it is  safe to try more intense activity such as jogging, treadmill, bicycling, low-impact aerobics, swimming, etc. b. Save the most intensive and strenuous activity for last such as sit-ups, heavy lifting, contact sports, etc  Refrain from any heavy lifting or straining until you are off narcotics for pain control.   c. DO NOT PUSH THROUGH PAIN.  Let pain be your guide: If it hurts to do something, don't do it.  Pain is your body warning you to avoid that activity for another week until the pain goes down. d. You may drive when you are no longer taking prescription pain medication, you can comfortably wear a seatbelt, and you can safely maneuver your car and apply brakes. e. Dennis Bast may have sexual intercourse when it is comfortable.  FOLLOW UP with the Peritoneal Dialysis nurses closely after surgery.  Call [phone] or your neprologist to help arrange training/flushes of your CAPD catheter    -The CAPD nurses & Nephrology usually follow you closely, making the need for follow-up in our office redundant and therefore not needed.  If they or you have concerns, please call us for possible follow-up in our office   -Please call [facility] at [phone] only as needed.  WHEN TO CALL us [phone]: 1. Poor pain control 2. Reactions / problems with new medications (rash/itching, nausea, etc)  3. Fever over 101.5 F (38.5 C) 4. Worsening swelling or bruising 5. Continued bleeding from incision. 6. Increased pain, redness, or drainage from the incision   The clinic staff is available to answer your questions during regular business hours (8:30am-5pm).  Please don't hesitate to call and ask to speak to one of our nurses for clinical concerns.   If you have a medical emergency, go to the nearest emergency room or call 911.  A surgeon from [facility] is always on call at the hospitals  9. IF YOU HAVE DISABILITY OR FAMILY LEAVE FORMS, BRING THEM TO THE OFFICE FOR PROCESSING.  DO NOT GIVE THEM TO  YOUR DOCTOR.  [facility], [place], [place], [place], [place]  [address] ? MAIN: [phone] ? TOLL FREE: [phone] ?  FAX [phone] [email or web address]  Peritoneal Dialysis - An Overview Dialysis can be done using a machine outside of the body (hemodialysis). Or, it can be done inside the body (peritoneal dialysis). The word "peritoneal" refers to the lining or membrane of the belly (abdominal cavity). The peritoneal membrane is a thin, plastic-like lining inside the belly that covers the organs and fits in the abdominal or peritoneal cavity, such as the stomach, liver and the kidneys. This lining works like a filter. It will allow certain things to pass from your blood through the lining and  into a special solution that has been placed into your belly. In this type of dialysis, the peritoneum is used to help clean the blood.  If you need dialysis, your kidneys are not working right. Healthy kidneys take out extra water and waste products, which becomes urine. When the kidneys do not do this, serious problems can develop. The waste and water build up in the blood. Your hands and feet might swell. You may feel tired, weak or sick to your stomach. Also, your blood pressure may rise. If not treated, you could die. Dialysis is a treatment that does the work that your kidneys would do if they were healthy.  It cleans your blood.   It will make sure your body has the right amount of certain chemicals that it needs. They include potassium, sodium and bicarbonate.   It will help control your blood pressure.  UNDERSTANDING PERITONEAL DIALYSIS  Here is how peritoneal dialysis works:   First, you will have surgery to put a soft plastic tube (catheter) into your belly (abdomen). This will allow you to easily connect yourself to special tubing, which will then let a special dialysis solution to be placed into your abdomen.   For each treatment, you will  need at least one bag of dialysis solution (a liquid called dialysate). It is a mix of water that is pure and free of germs (sterile), sugar (dextrose) and the nutrients and minerals found in your blood. Sometimes, more than one bag is needed to get the right amount of fluid for your abdomen. Your caregiver will explain what size and how many bags you will need.   The dialysate is slowly put through the catheter to fill the abdomen (called the peritoneal cavity). This dialysate will need to stay in your body for 3-4 hours. This is known as the dwell time.   The solution is working to clean the blood and remove wastes from your body. At the end of this time, the solution is drained from your body through tubing into an empty bag. It is then replaced with a fresh dialysate.   The draining and replacing of the dialysate is called an exchange or cycle. The catheter is capped after each exchange. Once the solution is in your body, you are then free to do whatever you would like until the next exchange. Most people will need to do 4-5 exchanges each day.   There are two different methods that can be used.   Continuous ambulatory peritoneal dialysis (CAPD): You put the solution into your abdomen, cap your catheter and then go about your day. Several hours later, you reconnect to a tubing set up, drain out the solution and then put more solution in. This is done several times a day. No machine is needed.   Continuous cycler-assisted peritoneal dialysis (CCPD): A machine is used, which fills the abdomen with dialysate and then drains it. This happens several times. It usually is done at night while you are sleeping. When you wake up, you can disconnect from the machine and are free to go to go about your day.  PREPARING FOR EXCHANGES  Discuss the details of the procedure with your caregivers. You will be working with a nurse who is specially trained in doing dialysis. Make sure you understand:   How to do an  exchange.   How much solution you need.   What type of solution you will need.   How often you should do an exchange. Ask:  How many times each day?   When? At meals? At bedtime?   Always keep the dialysate bags and other supplies in a cool, clean and dry place.   Keeping everything clean is very important.   The catheter and its cap must be free from germs (sterile)   The adapter also must be sterile. It attaches the dialysis bag and tubing to the catheter.   Clean the area of your body around the catheter every day. Use a chemical that fights infection (antiseptic).   Wash your hands thoroughly before starting an exchange.   You may be taught to wear a mask to cover your nose and mouth. This makes infection less likely to happen.   You may be taught to close doors, windows and turn off any fans before doing an exchange.   Check the dialysate bag very carefully.   Make sure it is the right size bag for you. This information is on the label.   Also, make sure it is the right mixture. For some people, the dialysate contents vary. For instance, the mixture might be a stronger solution for overnight.   Check the expiration date (the last date you can use the bag). It also is on the label. If the date has gone by, throw away the bag.   The solution should be clear. You should be able to see any writing on the side of the bag clearly through the solution. Do not use a cloudy solution.   Gently squeeze the bag to make sure there are no leaks.   Use a dry heating pad to warm the dialysate in the bag. Leave the cover on the bag while you do this.   This is for comfort. You can skip this step if you want.   Never place the bag of solution under warm or hot water. Water from a faucet is not sterile and could cause germs to get into the bag. Infection could then result.  PERFORMING AN EXCHANGE  For continuous ambulatory dialysis:   Attach the dialysis bag and tubing to your  catheter. Hang the bag so that gravity (the natural downward pull) draws the solution down and into your abdomen once the clamps are opened. This should take about 10 minutes.   Remove the bag and tubing from the catheter. Cap the catheter.   The solution stays in the abdomen for 3-4 hours (dwell time). The solution is working to clean the blood and remove wastes from your body.   When you are ready to drain the solution for another exchange, take the cap off the catheter. Then, attach the catheter to tubing, which is attached to an empty bag. Place this empty bag below the abdomen or on the floor or stool and undo the clamps.   Gravity helps pull the fluid out of the abdomen and into the bag. The fluid in the bag may look yellow and clear, like urine. It usually takes about 20 minutes to drain the fluid out of the abdomen.   When the solution has drained, start the process again by infusing a new bag of dialysate and then capping the catheter.   This should continue until you have used all of the solution that you are to use each day.   Sometimes, a small machine is used overnight. It is called a mini-cycler. This is done if the body cannot go all night without an exchange. The machine lets you sleep without having to get up and  do an exchange.   For continuous cycler-assisted dialysis:   You will be taught how to set up or program your machine.   When you are ready for bed, put the dialysate bags onto the cycler machine. Put on exactly the number of bags that your caregiver said to use.   Connect your catheter to the machine and turn the cycler machine on.   Overnight, the cycler will do several exchanges. It often does three to five, sometimes more.   Solution that is in your abdomen in the morning will stay during the day. The machine is set to make the daytime solution stronger, if that is needed.   In the morning, you will disconnect from the machine and cap your catheter and go  about your day.   Sometimes, an extra exchange is done during the day. This may be needed to remove excess waste or fluid.  IMPORTANT REMINDERS  You will need to follow a very strict schedule. Every step of the dialysis procedure must be done every day. Sometimes, several times a day. Altogether, this might take an extra 2 hours or more. However, you must stick to the routine. Do not skip a day. Do not skip a procedure.   Some people find it helpful to work with a Social worker or Education officer, museum in addition to the renal (kidney) nurse. They can help you figure out how to change your daily routine to fit in the dialysis sessions.   You may need to change your diet. Ask your caregiver for advice, or talk with a nutritionist about what you should and should not eat.   You will need to weigh yourself every day and keep track of what your weight is.   You may be taught how to check your blood pressure before every exchange. Your blood pressure reading will help determine what type of solution to use. If your blood pressure is too high, you may need a stronger solution.  RISKS AND COMPLICATIONS  Possible problems vary, depending on the method you use. Your overall health also can have an effect. Problems that could develop because of dialysis include:  Infection. This is the most common problem. It could occur:   In the peritoneum. This is called peritonitis.   Around the catheter.   Weight gain. The dialysate contains a type of sugar known as dextrose. Dextrose has a lot of calories. The body takes in several hundred calories from this sugar each day.   Weakened muscles in the abdomen. This can result from all of the fluid that your body has to hold in the abdomen.   Catheter replacement. Sometimes, a new one has to be put in.   Change in dialysis method. Due to some complications, you may need to change to hemodialysis for a short time and have your dialysis done at a center.   Trouble  adjusting to your new lifestyle. In some people, this leads to depression.   Sleep problems.   Dialysis-related amyloidosis. This sometimes occurs after 5 years of dialysis. Protein builds up in the blood. This can cause painful deposits on bones, joints and tendons (which connect muscle to bone). Or, it can cause hollow spots in bones that make them more likely to break.   Excess fluid. Your body may absorb too much of the fluid that is held in the abdomen. This can lead to heart or lung problems.  SEEK MEDICAL CARE IF:   You have any problems with an exchange.  The area around the catheter becomes red or painful.   The catheter seems loose, or it feels like it is coming out.   A bag of dialysate looks cloudy. Or, the liquid is an unusual color.   Abdominal pain or discomfort.   You feel sick to your stomach (nauseous) or throw up (vomit).   You develop a fever of more than 102 F (38.9 C).  SEEK IMMEDIATE MEDICAL CARE IF:  You develop a fever of more than 102 F (38.9 C). Document Released: 02/10/2009 Document Revised: 04/04/2011 Document Reviewed: 02/10/2009 [facility] Patient Information 2012 [place].  Diet for Peritoneal Dialysis This diet may be modified in protein, sodium, phosphorus, potassium, or fluid, depending on your needs. The goals of nutrition therapy are similar to those for patients on hemodialysis. Providing enough protein to replace peritoneal losses is a priority. USES OF THIS DIET The diet is designed for the patient with end-stage kidney (renal) disease, who is treated by peritoneal dialysis. Treatment options include:  Continuous Ambulatory Peritoneal Dialysis (CAPD): Usually 4 exchanges of 1.5 to 2 liter volumes of glucose (sugar) and electrolyte-containing dialysate.   Continuous Cyclic Peritoneal Dialysis (CCPD): Essentially a reversal of CAPD, with shorter exchanges at night and a longer one during the day.   Intermittent Peritoneal Dialysis  (IPD): 10 to 12 hours of exchanges, 2 to 3 times weekly.  ADEQUACY The diet may not meet the Recommended Dietary Allowances of the [employer] for calcium and ascorbic acid. Protein and water-soluble vitamin needs may be increased because of losses into the dialysate. Recommended daily supplements are the same as for hemodialysis patients. ASSESSMENT/DETERMINATION OF DIET Dietary needs will differ between patients. Parameters must be individualized. Protein  Guidelines: 1.2 to 1.3 gm/kg/day OR 1.5 gm/kg/day if patient is malnourished, catabolic, or has a protracted episode of peritonitis. A minimum of 50% of the protein intake should be of high biological value.   Goals: Meet protein requirements and replace dialysate losses while avoiding excessive accumulation of waste products. Achieve serum albumin greater than 3.5 g/dL.   Evaluate: Current nutritional status, serum albumin and BUN levels, presence of peritonitis.  Sodium  Guidelines: Usually 90 to 175 mEq (2000 to 4000 mg), but should be individualized.   Goals: Minimize complications of fluid imbalance.   Evaluate: Weight, blood pressure regulation, and presence of swelling (edema).  Potassium  Guidelines: Individualized; often not restricted, and may need to be supplemented.   Goals: Serum K+ levels between 4.0 to 5.0 mEq/L.   Evaluate: Serum K+ levels, usual intake of K+, appetite.  Phosphorus  Guidelines: 800 to 1200 mg/day (the high protein intake results in a high obligatory P intake).   Goal: Serum P levels between 4.5 to 6.0 mg/dL.   Evaluate: Serum P levels, usual P intake, P-binding medications: type, number, dosage, distribution.  Fluids  Guidelines: Individualized - may not be restricted for all patients.   Goal: Minimize complications of fluid imbalance.   Evaluate: Weight, blood pressure regulation, sodium intake, and presence of edema.  Document Released: 04/15/2005 Document Revised:  04/04/2011 Document Reviewed: 07/08/2006 [facility] Patient Information 2012 [place].

## 2013-08-18 NOTE — Progress Notes (Signed)
Subjective:     Patient ID: Priscilla [name], female   DOB: 10/23/1970, 43 y.o.   MRN: [id number]  HPI  Note: This dictation was prepared with Dragon/digital dictation along with Smartphrase technology. Any transcriptional errors that result from this process are unintentional.       Priscilla [name]  03/21/1971 [id number]  Patient Care Team: [name], MD as PCP - General (Internal Medicine)  This patient is a 43 y.o.female who presents today for surgical evaluation at the request of Dr. Jimmy Footman.   Reason for visit: Consider placing new peritoneal dialysis catheter.  Insulin-requiring type I diabetic.  History of prior peritoneal dialysis.  Catheter placed 2010.  Required repositionings and omentectomy and unplugging.  Numerous operations by Dr Excell Seltzer.  Since that time, underwent kidney pancreas transplantation.  CAPD was removed at that time.  1-2 episodes of CAPD peritonitis mild w antibiotics.  Was working well up until removed at the time of the transplant.    Unfortunately the renal aspect of the transplant failed.  Pancreas seems to be functioning enough that she is no longer insulin-requiring diabetic.  She is now dialysis dependent.  Apparently has a challenge to have hemodialysis through a fistula or shunt due to intimal hyperplasia and poor veins.  Therefore using Hickman HD catheters.  As best I can gather, she wished to be reconsidered for CAPD catheter.  Last note I have is a Dr. Augustin Coupe attempting to place catheter percutaneously per the patient request.  That was 3 weeks ago.  She comes here to the office with no other notes, I guess to consider the laparoscopic approach.     No exertional chest/neck/shoulder/arm pain.  Patient can walk 1 mile / 15 minutes without difficulty.    Patient Active Problem List   Diagnosis Date Noted  . Kidney transplant failure and rejection 09/24/2012  . ESRD (end stage renal disease) 09/24/2012  . Diabetes mellitus type I  09/24/2012  . H/O pancreas transplant 09/24/2012  . Pancreatitis of pancreas transplant 09/24/2012    Past Medical History  Diagnosis Date  . H/O kidney transplant   . Pancreas transplanted   . Diabetes mellitus without complication   . Hypertension   . Renal disorder   . Anemia     Past Surgical History  Procedure Laterality Date  . Nephrectomy transplanted organ    . Combined kidney-pancreas transplant    . Cesarean section      History   Social History  . Marital Status: Single    Spouse Name: N/A    Number of Children: N/A  . Years of Education: N/A   Occupational History  . Not on file.   Social History Main Topics  . Smoking status: Never Smoker   . Smokeless tobacco: Never Used  . Alcohol Use: No  . Drug Use: No  . Sexual Activity: Yes    Birth Control/ Protection: None   Other Topics Concern  . Not on file   Social History Narrative  . No narrative on file    Family History  Problem Relation Age of Onset  . CAD Neg Hx   . Stroke Mother     Current Outpatient Prescriptions  Medication Sig Dispense Refill  . acetaminophen (TYLENOL) 325 MG tablet Take 2 tablets (650 mg total) by mouth every 4 (four) hours as needed.      Marland Kitchen amLODipine (NORVASC) 5 MG tablet Take 5 mg by mouth.      Marland Kitchen aspirin 325 MG tablet 325 mg.      Marland Kitchen  heparin 5000 UNIT/ML injection Inject 1 mL (5,000 Units total) into the skin every 8 (eight) hours.  1 mL    . mycophenolate (MYFORTIC) 180 MG EC tablet Take 360 mg by mouth 2 (two) times daily.       Marland Kitchen omeprazole (PRILOSEC) 20 MG capsule Take 20 mg by mouth.      . predniSONE (DELTASONE) 5 MG tablet 5 mg.      . simvastatin (ZOCOR) 5 MG tablet Take 5 mg by mouth.      . sodium chloride 0.9 % SOLN 50 mL with methylPREDNISolone sodium succinate 1000 MG SOLR 500 mg Inject 500 mg into the vein daily.      Marland Kitchen sulfamethoxazole-trimethoprim (BACTRIM,SEPTRA) 400-80 MG per tablet 1 tablet.      . tacrolimus (PROGRAF) 1 MG capsule Take 3-4 mg  by mouth 2 (two) times daily. 4 tablets in morning, and 3 tablets in evening, 7 total tablets daily.      . valGANciclovir (VALCYTE) 450 MG tablet 450 mg.       No current facility-administered medications for this visit.     Allergies  Allergen Reactions  . Pork-Derived Products     Pt is Muslum    BP 140/75  Pulse 77  Temp(Src) 98.3 F (36.8 C)  Resp 16  Ht 5' (1.524 m)  Wt 115 lb (52.164 kg)  BMI 22.46 kg/m2  No results found.   Review of Systems  Constitutional: Negative for fever, chills, diaphoresis, appetite change and fatigue.  HENT: Negative for ear discharge, ear pain, sore throat and trouble swallowing.   Eyes: Negative for photophobia, discharge and visual disturbance.  Respiratory: Negative for cough, choking, chest tightness and shortness of breath.   Cardiovascular: Negative for chest pain and palpitations.  Gastrointestinal: Negative for nausea, vomiting, abdominal pain, diarrhea, constipation, anal bleeding and rectal pain.  Endocrine: Negative for cold intolerance and heat intolerance.  Genitourinary: Negative for dysuria, frequency and difficulty urinating.  Musculoskeletal: Negative for gait problem, myalgias and neck pain.  Skin: Negative for color change, pallor and rash.  Allergic/Immunologic: Negative for environmental allergies, food allergies and immunocompromised state.  Neurological: Negative for dizziness, speech difficulty, weakness and numbness.  Hematological: Negative for adenopathy.  Psychiatric/Behavioral: Negative for confusion and agitation. The patient is not nervous/anxious.        Objective:   Physical Exam  Constitutional: She is oriented to person, place, and time. She appears well-developed and well-nourished. No distress.  HENT:  Head: Normocephalic.  Mouth/Throat: Oropharynx is clear and moist. No oropharyngeal exudate.  Eyes: Conjunctivae and EOM are normal. Pupils are equal, round, and reactive to light. No scleral  icterus.  Neck: Normal range of motion. Neck supple. No tracheal deviation present.  Cardiovascular: Normal rate, regular rhythm and intact distal pulses.   Pulmonary/Chest: Effort normal and breath sounds normal. No stridor. No respiratory distress. She exhibits no tenderness.  Abdominal: Soft. She exhibits no distension and no mass. There is no tenderness. Hernia confirmed negative in the right inguinal area and confirmed negative in the left inguinal area.  Genitourinary: No vaginal discharge found.  Musculoskeletal: Normal range of motion. She exhibits no tenderness.       Right elbow: She exhibits normal range of motion.       Left elbow: She exhibits normal range of motion.       Right wrist: She exhibits normal range of motion.       Left wrist: She exhibits normal range of motion.  Right hand: Normal strength noted.       Left hand: Normal strength noted.  Lymphadenopathy:       Head (right side): No posterior auricular adenopathy present.       Head (left side): No posterior auricular adenopathy present.    She has no cervical adenopathy.    She has no axillary adenopathy.       Right: No inguinal adenopathy present.       Left: No inguinal adenopathy present.  Neurological: She is alert and oriented to person, place, and time. No cranial nerve deficit. She exhibits normal muscle tone. Coordination normal.  Skin: Skin is warm and dry. No rash noted. She is not diaphoretic. No erythema.  Psychiatric: She has a normal mood and affect. Her behavior is normal. Judgment and thought content normal.       Assessment:     End-stage renal disease due to type 1 diabetes status post prior peritoneal dialysis and failed kidney/pancreas transplant.  Now dialysis dependent again.  Request for replacement of new CAPD peritoneal dialysis catheter.     Plan:     Is reasonable to attempt laparoscopic exploration and see if this possible placement of peritoneal dialysis catheter.  She does  have a prior surgeries but no major adhesions as best I can gather.  Vital to be safe and avoid injury to the pancreas transplant - Location left upper quadrant midabdomen according to radiology studies.  Operative report of transplant procedure not able to be found despite numerous attempts.  Will enter in right upper quadrant, I will place the peritoneal dialysis catheter on the left side if possible since she is right-handed and has her kidney transplant on the RLQ right side  The anatomy & physiology of peritoneum was discussed.  Natural history risks without surgery of worsening renal failure was discussed.   I feel the risks of no intervention will lead to serious problems that outweigh the operative risks; therefore, I recommended placement of a peritoneal dialysis catheter.  I explained laparoscopic techniques with possible need for an open approach.    Risks such as bleeding, infection, abscess, injury to other organs, catheter occlusion or malpositioning, reoperation to remove/reposition the catheter, heart attack, death, and other risks were discussed.   Numerous prior surgeries, risks are increased.  I noted a good likelihood this will help address the problem.  Possibility that this will not be enough to compensate for the renal failure & need for further treatment such as hemodialysis was explained.  Goals of post-operative recovery were discussed as well.  We will work to minimize complications.   The patient is/will be getting training on catheter use by dialysis nursing before and after surgery.  I stressed the importance of meticulous care & sterile technique to prevent catheter problems.  Questions were answered.  The patient expresses understanding & wishes to proceed with surgery.

## 2013-08-30 ENCOUNTER — Encounter (HOSPITAL_COMMUNITY): Payer: Self-pay | Admitting: Pharmacy Technician

## 2013-08-31 NOTE — Pre-Procedure Instructions (Signed)
Priscilla [name]  08/31/2013   Your procedure is scheduled on:  Wednesday Sep 08, 2013 at 2:30 PM.  Report to [facility] Short Stay Entrance "A"  Admitting at 12:30 PM.  Call this number if you have problems the morning of surgery: [phone]   Remember:   Do not eat food or drink liquids after midnight.   Take these medicines the morning of surgery with A SIP OF WATER: Acetaminophen (Tylenol) if needed, Amlodipine (Norvasc), Mycophenolate (Myfortic), Omeprazole (Prilosec), and Tacrolimus (Prograf) Discontinue aspirin, and herbal medications 5 days prior to surgery OK to continue Aspirin if less than 350mg  / day   Do not wear jewelry, make-up or nail polish.  Do not wear lotions, powders, or perfumes.  Do not shave 48 hours prior to surgery. .  Do not bring valuables to the hospital.  [facility] is not responsible for any belongings or valuables.               Contacts, dentures or bridgework may not be worn into surgery.  Leave suitcase in the car. After surgery it may be brought to your room.  For patients admitted to the hospital, discharge time is determined by your treatment team.               Patients discharged the day of surgery will not be allowed to drive home.  Name and phone number of your driver: Family/Friend  Special Instructions: Please shower the night before and the morning of your surgery using CHG soap   Please read over the following fact sheets that you were given: Pain Booklet, Coughing and Deep Breathing and Surgical Site Infection Prevention

## 2013-09-01 ENCOUNTER — Encounter (HOSPITAL_COMMUNITY)
Admission: RE | Admit: 2013-09-01 | Discharge: 2013-09-01 | Disposition: A | Discharge status: Home / Self Care | Payer: PRIVATE HEALTH INSURANCE | Source: Ambulatory Visit | Attending: Surgery | Admitting: Surgery

## 2013-09-01 ENCOUNTER — Encounter (HOSPITAL_COMMUNITY): Payer: Self-pay

## 2013-09-01 DIAGNOSIS — Z01812 Encounter for preprocedural laboratory examination: Secondary | ICD-10-CM | POA: Insufficient documentation

## 2013-09-01 DIAGNOSIS — Z01810 Encounter for preprocedural cardiovascular examination: Secondary | ICD-10-CM | POA: Insufficient documentation

## 2013-09-01 DIAGNOSIS — Z01818 Encounter for other preprocedural examination: Secondary | ICD-10-CM | POA: Insufficient documentation

## 2013-09-01 HISTORY — DX: Gastro-esophageal reflux disease without esophagitis: K21.9

## 2013-09-01 LAB — BASIC METABOLIC PANEL
BUN: 19 mg/dL (ref 6–23)
CALCIUM: 9.7 mg/dL (ref 8.4–10.5)
CO2: 28 mEq/L (ref 19–32)
Chloride: 96 mEq/L (ref 96–112)
Creatinine, Ser: 6.19 mg/dL — ABNORMAL HIGH (ref 0.50–1.10)
GFR calc Af Amer: 9 mL/min — ABNORMAL LOW (ref >90)
GFR, EST NON AFRICAN AMERICAN: 8 mL/min — AB (ref >90)
Glucose, Bld: 134 mg/dL — ABNORMAL HIGH (ref 70–99)
Potassium: 4.7 mEq/L (ref 3.7–5.3)
Sodium: 141 mEq/L (ref 137–147)

## 2013-09-01 LAB — CBC
HCT: 41.3 % (ref 36.0–46.0)
Hemoglobin: 13 g/dL (ref 12.0–15.0)
MCH: 26.2 pg (ref 26.0–34.0)
MCHC: 31.5 g/dL (ref 30.0–36.0)
MCV: 83.1 fL (ref 78.0–100.0)
PLATELETS: 366 10*3/uL (ref 150–400)
RBC: 4.97 MIL/uL (ref 3.87–5.11)
RDW: 21.8 % — ABNORMAL HIGH (ref 11.5–15.5)
WBC: 5.6 10*3/uL (ref 4.0–10.5)

## 2013-09-01 LAB — HCG, SERUM, QUALITATIVE: PREG SERUM: NEGATIVE

## 2013-09-01 NOTE — Progress Notes (Signed)
No comparison EKG in EPIC. Will request EKG and stress test from [facility].

## 2013-09-01 NOTE — Progress Notes (Signed)
Patient informed Nurse that she had a stress test before her kidney transplant at [facility] in January 2012. Patient denied having a cardiac cath, sleep study, or any acute cardiac or pulmonary issues. PCP is [name] and patients sees Dr. Joelyn Oms at [facility]

## 2013-09-02 NOTE — Progress Notes (Signed)
Anesthesia Chart Review:  Patient is a 43 year old female scheduled for laparoscopic exploration, possible LOA, placement of CAPD catheter on 09/08/13 by Dr. Johney Maine.  History includes DM type 1, non-smoker, GERD, HTN, ESRD s/p previous PD then s/p renal pancreas transplant 05/2010 which is now failed and she is again dialysis dependent (needing tunneled catheter at HD due to poor veins/vascular access). PCP is Dr. [name]. Nephrologist is Dr. Jimmy Footman.  EKG on 09/01/13 showed NSR, possible LAE, LAD, low voltage QRS, inferior infarct (age undetermined), cannot rule out anterior infarct (age undetermined). EKG was not felt significantly changed from her previous tracing on 10/03/09 (see Muse).  Stress echo was done at [facility], but > 3 years ago (done 01/1509).  Preoperative CXR and labs noted.  She will get an ISTAT on arrival.  If same day labs are acceptable and otherwise no acute changes then I would anticipate that she could proceed as planned.  George Hugh [facility] Short Stay Center/Anesthesiology Phone [phone] 09/02/2013 6:56 PM

## 2013-09-07 MED ORDER — CHLORHEXIDINE GLUCONATE 4 % EX LIQD
1.0000 "application " | Freq: Once | CUTANEOUS | Status: DC
  Filled 2013-09-07: qty 15

## 2013-09-07 MED ORDER — CEFAZOLIN SODIUM-DEXTROSE 2-3 GM-% IV SOLR
2.0000 g | INTRAVENOUS | Status: AC
  Administered 2013-09-08: 2 g via INTRAVENOUS
  Filled 2013-09-07: qty 50

## 2013-09-07 MED ORDER — GENTAMICIN IN SALINE 1-0.9 MG/ML-% IV SOLN
100.0000 mg | INTRAVENOUS | Status: AC
Start: 2013-09-08 — End: 2013-09-08
  Administered 2013-09-08: 100 mg via INTRAVENOUS
  Filled 2013-09-07: qty 100

## 2013-09-08 ENCOUNTER — Encounter (HOSPITAL_COMMUNITY)
Admission: RE | Disposition: A | Discharge status: Home / Self Care | Payer: Self-pay | Source: Ambulatory Visit | Attending: Surgery

## 2013-09-08 ENCOUNTER — Ambulatory Visit (HOSPITAL_COMMUNITY)
Admission: RE | Admit: 2013-09-08 | Discharge: 2013-09-08 | Disposition: A | Discharge status: Home / Self Care | Payer: PRIVATE HEALTH INSURANCE | Source: Ambulatory Visit | Attending: Surgery | Admitting: Surgery

## 2013-09-08 ENCOUNTER — Encounter (HOSPITAL_COMMUNITY): Payer: PRIVATE HEALTH INSURANCE | Admitting: Vascular Surgery

## 2013-09-08 ENCOUNTER — Encounter (HOSPITAL_COMMUNITY): Payer: Self-pay | Admitting: *Deleted

## 2013-09-08 ENCOUNTER — Ambulatory Visit (HOSPITAL_COMMUNITY): Payer: PRIVATE HEALTH INSURANCE | Admitting: Certified Registered Nurse Anesthetist

## 2013-09-08 DIAGNOSIS — Z9483 Pancreas transplant status: Secondary | ICD-10-CM | POA: Insufficient documentation

## 2013-09-08 DIAGNOSIS — T8610 Unspecified complication of kidney transplant: Secondary | ICD-10-CM | POA: Insufficient documentation

## 2013-09-08 DIAGNOSIS — I120 Hypertensive chronic kidney disease with stage 5 chronic kidney disease or end stage renal disease: Secondary | ICD-10-CM | POA: Diagnosis not present

## 2013-09-08 DIAGNOSIS — Y830 Surgical operation with transplant of whole organ as the cause of abnormal reaction of the patient, or of later complication, without mention of misadventure at the time of the procedure: Secondary | ICD-10-CM | POA: Diagnosis not present

## 2013-09-08 DIAGNOSIS — N186 End stage renal disease: Secondary | ICD-10-CM

## 2013-09-08 DIAGNOSIS — Z79899 Other long term (current) drug therapy: Secondary | ICD-10-CM | POA: Diagnosis not present

## 2013-09-08 DIAGNOSIS — Z992 Dependence on renal dialysis: Secondary | ICD-10-CM | POA: Insufficient documentation

## 2013-09-08 DIAGNOSIS — K219 Gastro-esophageal reflux disease without esophagitis: Secondary | ICD-10-CM | POA: Insufficient documentation

## 2013-09-08 DIAGNOSIS — D649 Anemia, unspecified: Secondary | ICD-10-CM | POA: Diagnosis not present

## 2013-09-08 DIAGNOSIS — E119 Type 2 diabetes mellitus without complications: Secondary | ICD-10-CM | POA: Diagnosis not present

## 2013-09-08 HISTORY — PX: CAPD INSERTION: SHX5233

## 2013-09-08 LAB — POCT I-STAT 4, (NA,K, GLUC, HGB,HCT)
Glucose, Bld: 81 mg/dL (ref 70–99)
HEMATOCRIT: 46 % (ref 36.0–46.0)
Hemoglobin: 15.6 g/dL — ABNORMAL HIGH (ref 12.0–15.0)
Potassium: 4 mEq/L (ref 3.7–5.3)
Sodium: 138 mEq/L (ref 137–147)

## 2013-09-08 LAB — GLUCOSE, CAPILLARY
GLUCOSE-CAPILLARY: 73 mg/dL (ref 70–99)
Glucose-Capillary: 81 mg/dL (ref 70–99)
Glucose-Capillary: 79 mg/dL (ref 70–99)

## 2013-09-08 SURGERY — LAPAROSCOPIC INSERTION CONTINUOUS AMBULATORY PERITONEAL DIALYSIS  (CAPD) CATHETER
Anesthesia: General

## 2013-09-08 MED ORDER — 0.9 % SODIUM CHLORIDE (POUR BTL) OPTIME
TOPICAL | Status: DC | PRN
  Administered 2013-09-08: 2000 mL

## 2013-09-08 MED ORDER — FENTANYL CITRATE 0.05 MG/ML IJ SOLN: 25.0000 ug | INTRAMUSCULAR | Status: DC | PRN

## 2013-09-08 MED ORDER — NEOSTIGMINE METHYLSULFATE 10 MG/10ML IV SOLN
INTRAVENOUS | Status: DC | PRN
  Administered 2013-09-08: 3 mg via INTRAVENOUS

## 2013-09-08 MED ORDER — OXYCODONE HCL 5 MG/5ML PO SOLN
5.0000 mg | Freq: Once | ORAL | Status: AC | PRN
Start: 2013-09-08 — End: 2013-09-08

## 2013-09-08 MED ORDER — ROCURONIUM BROMIDE 100 MG/10ML IV SOLN
INTRAVENOUS | Status: DC | PRN
  Administered 2013-09-08: 50 mg via INTRAVENOUS

## 2013-09-08 MED ORDER — AMLODIPINE BESYLATE 5 MG PO TABS: 5.0000 mg | ORAL_TABLET | Freq: Every day | ORAL | Status: DC

## 2013-09-08 MED ORDER — ONDANSETRON HCL 4 MG/2ML IJ SOLN
INTRAMUSCULAR | Status: DC | PRN
  Administered 2013-09-08: 4 mg via INTRAVENOUS

## 2013-09-08 MED ORDER — BUPIVACAINE-EPINEPHRINE (PF) 0.25% -1:200000 IJ SOLN
INTRAMUSCULAR | Status: AC
  Filled 2013-09-08: qty 30

## 2013-09-08 MED ORDER — FENTANYL CITRATE 0.05 MG/ML IJ SOLN
INTRAMUSCULAR | Status: AC
  Filled 2013-09-08: qty 5

## 2013-09-08 MED ORDER — LIDOCAINE HCL (CARDIAC) 20 MG/ML IV SOLN
INTRAVENOUS | Status: AC
  Filled 2013-09-08: qty 5

## 2013-09-08 MED ORDER — ONDANSETRON HCL 4 MG/2ML IJ SOLN
INTRAMUSCULAR | Status: AC
  Filled 2013-09-08: qty 2

## 2013-09-08 MED ORDER — GLYCOPYRROLATE 0.2 MG/ML IJ SOLN
INTRAMUSCULAR | Status: DC | PRN
  Administered 2013-09-08: 0.4 mg via INTRAVENOUS

## 2013-09-08 MED ORDER — PROPOFOL 10 MG/ML IV BOLUS
INTRAVENOUS | Status: AC
  Filled 2013-09-08: qty 20

## 2013-09-08 MED ORDER — HYDROMORPHONE HCL PF 1 MG/ML IJ SOLN: 0.2500 mg | INTRAMUSCULAR | Status: DC | PRN

## 2013-09-08 MED ORDER — ARTIFICIAL TEARS OP OINT
TOPICAL_OINTMENT | OPHTHALMIC | Status: AC
Start: 2013-09-08 — End: 2013-09-08
  Filled 2013-09-08: qty 3.5

## 2013-09-08 MED ORDER — PROPOFOL 10 MG/ML IV BOLUS
INTRAVENOUS | Status: DC | PRN
  Administered 2013-09-08: 100 mg via INTRAVENOUS

## 2013-09-08 MED ORDER — ARTIFICIAL TEARS OP OINT
TOPICAL_OINTMENT | OPHTHALMIC | Status: DC | PRN
  Administered 2013-09-08: 1 via OPHTHALMIC

## 2013-09-08 MED ORDER — ROCURONIUM BROMIDE 50 MG/5ML IV SOLN
INTRAVENOUS | Status: AC
  Filled 2013-09-08: qty 1

## 2013-09-08 MED ORDER — NEOSTIGMINE METHYLSULFATE 10 MG/10ML IV SOLN
INTRAVENOUS | Status: AC
  Filled 2013-09-08: qty 1

## 2013-09-08 MED ORDER — OXYCODONE HCL 5 MG PO TABS
ORAL_TABLET | ORAL | Status: AC
  Filled 2013-09-08: qty 1

## 2013-09-08 MED ORDER — MIDAZOLAM HCL 2 MG/2ML IJ SOLN
INTRAMUSCULAR | Status: AC
  Filled 2013-09-08: qty 2

## 2013-09-08 MED ORDER — OXYCODONE HCL 5 MG PO TABS
5.0000 mg | ORAL_TABLET | ORAL | Status: DC | PRN
  Administered 2013-09-08: 5 mg via ORAL

## 2013-09-08 MED ORDER — PHENYLEPHRINE HCL 10 MG/ML IJ SOLN
INTRAMUSCULAR | Status: DC | PRN
  Administered 2013-09-08: 80 ug via INTRAVENOUS
  Administered 2013-09-08: 120 ug via INTRAVENOUS
  Administered 2013-09-08: 80 ug via INTRAVENOUS
  Administered 2013-09-08: 120 ug via INTRAVENOUS

## 2013-09-08 MED ORDER — LIDOCAINE HCL (CARDIAC) 20 MG/ML IV SOLN
INTRAVENOUS | Status: DC | PRN
  Administered 2013-09-08: 100 mg via INTRAVENOUS

## 2013-09-08 MED ORDER — SODIUM CHLORIDE 0.9 % IV SOLN
INTRAVENOUS | Status: DC
  Administered 2013-09-08: 13:00:00 via INTRAVENOUS

## 2013-09-08 MED ORDER — OXYCODONE HCL 5 MG PO TABS
5.0000 mg | ORAL_TABLET | Freq: Once | ORAL | Status: AC | PRN
  Administered 2013-09-08: 5 mg via ORAL

## 2013-09-08 MED ORDER — ACETAMINOPHEN 500 MG PO TABS: 1000.0000 mg | ORAL_TABLET | Freq: Three times a day (TID) | ORAL | Status: DC

## 2013-09-08 MED ORDER — MIDAZOLAM HCL 5 MG/5ML IJ SOLN
INTRAMUSCULAR | Status: DC | PRN
  Administered 2013-09-08: 2 mg via INTRAVENOUS

## 2013-09-08 MED ORDER — BUPIVACAINE-EPINEPHRINE 0.25% -1:200000 IJ SOLN
INTRAMUSCULAR | Status: DC | PRN
  Administered 2013-09-08: 5 mL
  Administered 2013-09-08: 10 mL

## 2013-09-08 MED ORDER — ACETAMINOPHEN 325 MG PO TABS: 650.0000 mg | ORAL_TABLET | ORAL | Status: DC | PRN

## 2013-09-08 MED ORDER — ONDANSETRON HCL 4 MG/2ML IJ SOLN: 4.0000 mg | Freq: Once | INTRAMUSCULAR | Status: DC | PRN

## 2013-09-08 MED ORDER — MEPERIDINE HCL 25 MG/ML IJ SOLN: 6.2500 mg | INTRAMUSCULAR | Status: DC | PRN

## 2013-09-08 MED ORDER — FENTANYL CITRATE 0.05 MG/ML IJ SOLN
INTRAMUSCULAR | Status: DC | PRN
  Administered 2013-09-08: 50 ug via INTRAVENOUS
  Administered 2013-09-08: 100 ug via INTRAVENOUS

## 2013-09-08 MED ORDER — TRAMADOL HCL 50 MG PO TABS: 50.0000 mg | ORAL_TABLET | Freq: Four times a day (QID) | ORAL | Status: DC | PRN

## 2013-09-08 MED ORDER — HYDROMORPHONE HCL PF 1 MG/ML IJ SOLN
INTRAMUSCULAR | Status: AC
  Filled 2013-09-08: qty 1

## 2013-09-08 MED ORDER — GLYCOPYRROLATE 0.2 MG/ML IJ SOLN
INTRAMUSCULAR | Status: AC
  Filled 2013-09-08: qty 2

## 2013-09-08 SURGICAL SUPPLY — 48 items
ADAPTER TITANIUM MEDIONICS (MISCELLANEOUS) ×2 IMPLANT
ADPR DLYS CATH STRL LF DISP (MISCELLANEOUS) ×1
BAG DECANTER FOR FLEXI CONT (MISCELLANEOUS) ×2 IMPLANT
BLADE SURG ROTATE 9660 (MISCELLANEOUS) ×2 IMPLANT
CANISTER SUCTION 2500CC (MISCELLANEOUS) ×2 IMPLANT
CATH EXTENDED DIALYSIS (CATHETERS) ×2 IMPLANT
CHLORAPREP W/TINT 26ML (MISCELLANEOUS) ×2 IMPLANT
COVER SURGICAL LIGHT HANDLE (MISCELLANEOUS) ×2 IMPLANT
DECANTER SPIKE VIAL GLASS SM (MISCELLANEOUS) ×2 IMPLANT
DEVICE TROCAR PUNCTURE CLOSURE (ENDOMECHANICALS) ×2 IMPLANT
DISSECTOR BLUNT TIP ENDO 5MM (MISCELLANEOUS) IMPLANT
DRAPE UTILITY 15X26 (DRAPE) ×4 IMPLANT
DRAPE WARM FLUID 44X44 (DRAPE) ×2 IMPLANT
DRSG PAD ABDOMINAL 8X10 ST (GAUZE/BANDAGES/DRESSINGS) IMPLANT
DRSG TEGADERM 4X4.75 (GAUZE/BANDAGES/DRESSINGS) ×1 IMPLANT
ELECT REM PT RETURN 9FT ADLT (ELECTROSURGICAL) ×2
ELECTRODE REM PT RTRN 9FT ADLT (ELECTROSURGICAL) ×1 IMPLANT
GAUZE SPONGE 2X2 8PLY STRL LF (GAUZE/BANDAGES/DRESSINGS) IMPLANT
GLOVE BIOGEL M 8.0 STRL (GLOVE) ×1 IMPLANT
GLOVE BIOGEL PI IND STRL 6.5 (GLOVE) ×2 IMPLANT
GLOVE BIOGEL PI IND STRL 8 (GLOVE) ×3 IMPLANT
GLOVE BIOGEL PI INDICATOR 6.5 (GLOVE) ×2
GLOVE BIOGEL PI INDICATOR 8 (GLOVE) ×3
GLOVE ECLIPSE 6.5 STRL STRAW (GLOVE) ×2 IMPLANT
GLOVE ECLIPSE 8.0 STRL XLNG CF (GLOVE) ×2 IMPLANT
GOWN STRL REUS W/ TWL LRG LVL3 (GOWN DISPOSABLE) ×2 IMPLANT
GOWN STRL REUS W/ TWL XL LVL3 (GOWN DISPOSABLE) ×1 IMPLANT
GOWN STRL REUS W/TWL LRG LVL3 (GOWN DISPOSABLE) ×4
GOWN STRL REUS W/TWL XL LVL3 (GOWN DISPOSABLE) ×2
KIT BASIN OR (CUSTOM PROCEDURE TRAY) ×2 IMPLANT
KIT ROOM TURNOVER OR (KITS) ×2 IMPLANT
NEEDLE 22X1 1/2 (OR ONLY) (NEEDLE) ×2 IMPLANT
NS IRRIG 1000ML POUR BTL (IV SOLUTION) ×2 IMPLANT
PAD ARMBOARD 7.5X6 YLW CONV (MISCELLANEOUS) ×4 IMPLANT
SET IRRIG TUBING LAPAROSCOPIC (IRRIGATION / IRRIGATOR) IMPLANT
SLEEVE ENDOPATH XCEL 5M (ENDOMECHANICALS) ×3 IMPLANT
SPONGE GAUZE 2X2 STER 10/PKG (GAUZE/BANDAGES/DRESSINGS) ×1
STYLET FALLER MEDIONICS (MISCELLANEOUS) ×2 IMPLANT
SUT MNCRL AB 4-0 PS2 18 (SUTURE) ×2 IMPLANT
SUT PROLENE 2 0 CT2 30 (SUTURE) ×6 IMPLANT
SUT SILK 2 0 SH (SUTURE) IMPLANT
SYR 50ML LL SCALE MARK (SYRINGE) ×2 IMPLANT
TOWEL OR 17X24 6PK STRL BLUE (TOWEL DISPOSABLE) ×2 IMPLANT
TOWEL OR 17X26 10 PK STRL BLUE (TOWEL DISPOSABLE) ×2 IMPLANT
TRAY LAPAROSCOPIC (CUSTOM PROCEDURE TRAY) ×2 IMPLANT
TROCAR XCEL NON BLADE 8MM B8LT (ENDOMECHANICALS) ×2 IMPLANT
TROCAR XCEL NON-BLD 5MMX100MML (ENDOMECHANICALS) ×2 IMPLANT
TUBING INSUFFLATION (TUBING) ×1 IMPLANT

## 2013-09-08 NOTE — H&P (View-Only) (Signed)
Subjective:     Patient ID: Priscilla [name] [name], female   DOB: 03/07/1971, 43 y.o.   MRN: [id number]  HPI  Note: This dictation was prepared with Dragon/digital dictation along with Smartphrase technology. Any transcriptional errors that result from this process are unintentional.       Priscilla [name] [name]  24-May-1970 [id number]  Patient Care Team: [name], MD as PCP - General (Internal Medicine)  This patient is a 43 y.o.female who presents today for surgical evaluation at the request of Dr. Jimmy Footman.   Reason for visit: Consider placing new peritoneal dialysis catheter.  Insulin-requiring type I diabetic.  History of prior peritoneal dialysis.  Catheter placed 2010.  Required repositionings and omentectomy and unplugging.  Numerous operations by Dr Excell Seltzer.  Since that time, underwent kidney pancreas transplantation.  CAPD was removed at that time.  1-2 episodes of CAPD peritonitis mild w antibiotics.  Was working well up until removed at the time of the transplant.    Unfortunately the renal aspect of the transplant failed.  Pancreas seems to be functioning enough that she is no longer insulin-requiring diabetic.  She is now dialysis dependent.  Apparently has a challenge to have hemodialysis through a fistula or shunt due to intimal hyperplasia and poor veins.  Therefore using [facility]man HD catheters.  As best I can gather, she wished to be reconsidered for CAPD catheter.  Last note I have is a Dr. Augustin Coupe attempting to place catheter percutaneously per the patient request.  That was 3 weeks ago.  She comes here to the office with no other notes, I guess to consider the laparoscopic approach.     No exertional chest/neck/shoulder/arm pain.  Patient can walk 1 mile / 15 minutes without difficulty.    Patient Active Problem List   Diagnosis Date Noted  . Kidney transplant failure and rejection 09/24/2012  . ESRD (end stage renal disease) 09/24/2012  . Diabetes mellitus type I  09/24/2012  . H/O pancreas transplant 09/24/2012  . Pancreatitis of pancreas transplant 09/24/2012    Past Medical History  Diagnosis Date  . H/O kidney transplant   . Pancreas transplanted   . Diabetes mellitus without complication   . Hypertension   . Renal disorder   . Anemia     Past Surgical History  Procedure Laterality Date  . Nephrectomy transplanted organ    . Combined kidney-pancreas transplant    . Cesarean section      History   Social History  . Marital Status: Single    Spouse Name: N/A    Number of Children: N/A  . Years of Education: N/A   Occupational History  . Not on file.   Social History Main Topics  . Smoking status: Never Smoker   . Smokeless tobacco: Never Used  . Alcohol Use: No  . Drug Use: No  . Sexual Activity: Yes    Birth Control/ Protection: None   Other Topics Concern  . Not on file   Social History Narrative  . No narrative on file    Family History  Problem Relation Age of Onset  . CAD Neg Hx   . Stroke Mother     Current Outpatient Prescriptions  Medication Sig Dispense Refill  . acetaminophen (TYLENOL) 325 MG tablet Take 2 tablets (650 mg total) by mouth every 4 (four) hours as needed.      Marland Kitchen amLODipine (NORVASC) 5 MG tablet Take 5 mg by mouth.      Marland Kitchen aspirin 325 MG tablet 325 mg.      Marland Kitchen  heparin 5000 UNIT/ML injection Inject 1 mL (5,000 Units total) into the skin every 8 (eight) hours.  1 mL    . mycophenolate (MYFORTIC) 180 MG EC tablet Take 360 mg by mouth 2 (two) times daily.       Marland Kitchen omeprazole (PRILOSEC) 20 MG capsule Take 20 mg by mouth.      . predniSONE (DELTASONE) 5 MG tablet 5 mg.      . simvastatin (ZOCOR) 5 MG tablet Take 5 mg by mouth.      . sodium chloride 0.9 % SOLN 50 mL with methylPREDNISolone sodium succinate 1000 MG SOLR 500 mg Inject 500 mg into the vein daily.      Marland Kitchen sulfamethoxazole-trimethoprim (BACTRIM,SEPTRA) 400-80 MG per tablet 1 tablet.      . tacrolimus (PROGRAF) 1 MG capsule Take 3-4 mg  by mouth 2 (two) times daily. 4 tablets in morning, and 3 tablets in evening, 7 total tablets daily.      . valGANciclovir (VALCYTE) 450 MG tablet 450 mg.       No current facility-administered medications for this visit.     Allergies  Allergen Reactions  . Pork-Derived Products     Pt is Muslum    BP 140/75  Pulse 77  Temp(Src) 98.3 F (36.8 C)  Resp 16  Ht 5' (1.524 m)  Wt 115 lb (52.164 kg)  BMI 22.46 kg/m2  No results found.   Review of Systems  Constitutional: Negative for fever, chills, diaphoresis, appetite change and fatigue.  HENT: Negative for ear discharge, ear pain, sore throat and trouble swallowing.   Eyes: Negative for photophobia, discharge and visual disturbance.  Respiratory: Negative for cough, choking, chest tightness and shortness of breath.   Cardiovascular: Negative for chest pain and palpitations.  Gastrointestinal: Negative for nausea, vomiting, abdominal pain, diarrhea, constipation, anal bleeding and rectal pain.  Endocrine: Negative for cold intolerance and heat intolerance.  Genitourinary: Negative for dysuria, frequency and difficulty urinating.  Musculoskeletal: Negative for gait problem, myalgias and neck pain.  Skin: Negative for color change, pallor and rash.  Allergic/Immunologic: Negative for environmental allergies, food allergies and immunocompromised state.  Neurological: Negative for dizziness, speech difficulty, weakness and numbness.  Hematological: Negative for adenopathy.  Psychiatric/Behavioral: Negative for confusion and agitation. The patient is not nervous/anxious.        Objective:   Physical Exam  Constitutional: She is oriented to person, place, and time. She appears well-developed and well-nourished. No distress.  HENT:  Head: Normocephalic.  Mouth/Throat: Oropharynx is clear and moist. No oropharyngeal exudate.  Eyes: Conjunctivae and EOM are normal. Pupils are equal, round, and reactive to light. No scleral  icterus.  Neck: Normal range of motion. Neck supple. No tracheal deviation present.  Cardiovascular: Normal rate, regular rhythm and intact distal pulses.   Pulmonary/Chest: Effort normal and breath sounds normal. No stridor. No respiratory distress. She exhibits no tenderness.  Abdominal: Soft. She exhibits no distension and no mass. There is no tenderness. Hernia confirmed negative in the right inguinal area and confirmed negative in the left inguinal area.  Genitourinary: No vaginal discharge found.  Musculoskeletal: Normal range of motion. She exhibits no tenderness.       Right elbow: She exhibits normal range of motion.       Left elbow: She exhibits normal range of motion.       Right wrist: She exhibits normal range of motion.       Left wrist: She exhibits normal range of motion.  Right hand: Normal strength noted.       Left hand: Normal strength noted.  Lymphadenopathy:       Head (right side): No posterior auricular adenopathy present.       Head (left side): No posterior auricular adenopathy present.    She has no cervical adenopathy.    She has no axillary adenopathy.       Right: No inguinal adenopathy present.       Left: No inguinal adenopathy present.  Neurological: She is alert and oriented to person, place, and time. No cranial nerve deficit. She exhibits normal muscle tone. Coordination normal.  Skin: Skin is warm and dry. No rash noted. She is not diaphoretic. No erythema.  Psychiatric: She has a normal mood and affect. Her behavior is normal. Judgment and thought content normal.       Assessment:     End-stage renal disease due to type 1 diabetes status post prior peritoneal dialysis and failed kidney/pancreas transplant.  Now dialysis dependent again.  Request for replacement of new CAPD peritoneal dialysis catheter.     Plan:     Is reasonable to attempt laparoscopic exploration and see if this possible placement of peritoneal dialysis catheter.  She does  have a prior surgeries but no major adhesions as best I can gather.  Vital to be safe and avoid injury to the pancreas transplant - Location left upper quadrant midabdomen according to radiology studies.  Operative report of transplant procedure not able to be found despite numerous attempts.  Will enter in right upper quadrant, I will place the peritoneal dialysis catheter on the left side if possible since she is right-handed and has her kidney transplant on the RLQ right side  The anatomy & physiology of peritoneum was discussed.  Natural history risks without surgery of worsening renal failure was discussed.   I feel the risks of no intervention will lead to serious problems that outweigh the operative risks; therefore, I recommended placement of a peritoneal dialysis catheter.  I explained laparoscopic techniques with possible need for an open approach.    Risks such as bleeding, infection, abscess, injury to other organs, catheter occlusion or malpositioning, reoperation to remove/reposition the catheter, heart attack, death, and other risks were discussed.   Numerous prior surgeries, risks are increased.  I noted a good likelihood this will help address the problem.  Possibility that this will not be enough to compensate for the renal failure & need for further treatment such as hemodialysis was explained.  Goals of post-operative recovery were discussed as well.  We will work to minimize complications.   The patient is/will be getting training on catheter use by dialysis nursing before and after surgery.  I stressed the importance of meticulous care & sterile technique to prevent catheter problems.  Questions were answered.  The patient expresses understanding & wishes to proceed with surgery.

## 2013-09-08 NOTE — Transfer of Care (Signed)
Immediate Anesthesia Transfer of Care Note  Patient: Priscilla [name]  Procedure(s) Performed: Procedure(s): Laparoscopic CAPD peritoneal dialysis catheter placement, possible lysis of adhesions    (N/A)  Patient Location: PACU  Anesthesia Type:General  Level of Consciousness: awake and oriented  Airway & Oxygen Therapy: Patient Spontanous Breathing and Patient connected to nasal cannula oxygen  Post-op Assessment: Report given to PACU RN  Post vital signs: Reviewed and stable  Complications: No apparent anesthesia complications

## 2013-09-08 NOTE — Discharge Instructions (Signed)
Peritoneal Dialysis (CAPD) Catheter  Healthy kidneys remove excess water and waste products from the body in the form of urine. When the kidneys cannot do this, serious problems develop and a person can fall into kidney failure.   In most cases, the kidneys can heal and recover to a better place.  Sometimes the kidneys remain somewhat damaged and a kidney specialist (nephrologist) needs to help manage the disease with medications and adjustments in diet.    Sometimes the kidney failure has progressed too far.  The bodys waste and fluid build up in the blood. Hands and legs swell. You start to feel more weak or nauseated. Your blood pressure may rise, to seeing you at serious risk for heart attack and stroke.  If not treated, you will eventually die. Dialysis is a treatment that replaces the work that your kidneys would do if they were healthy to help keep you alive.  Dialysis can be done using a machine outside of the body (hemodialysis) connected to your blood; or, it can be done with a tube placed to enter your abdomen (peritoneal dialysis). The peritoneum is the inner lining of your abdominal cavity, covering the inner organs & abdominal wall.  This inner lining of peritoneum works like a filter.   Peritoneal dialysis involves placed several quarts of sterile fluid into the inner abdomen/peritoneal cavity.  The waste products and fluids can exchange across the peritoneal membrane.  The fluid is later removed.  This most poor every day.  Sometimes the catheter can be attached to a machine that runs at night while you sleep (continuous cycler-assisted dialysis = CCPD).  Sometimes the fluid can be infused in her abdomen and then later removed hours later (continuous ambulatory peritoneal dialysis = CAPD).  If your nephrologist feels like you are a good candidate, you will be sent to be evaluated by a surgeon to consider placement of a peritoneal dialysis catheter.  Your nephrologist should have shown you  educational videos .  You should discuss with dialysis nurses about what life with a peritoneal dialysis catheter is like.  Most patients who have not had many abdominal operations are reasonable candidates for placement of a peritoneal dialysis catheter   It is important to understand the risks and benefits of the procedure prior to undergoing surgery.    If your surgeon feels you are a reasonable candidate, you will have a peritoneal dialysis catheter placed.    This requires an operation under general anesthesia.  The surgeon places a soft plastic tube ([place] curl catheter) into your abdomen.  The deeper curled tip rests down in the pelvis.  The middle part is tunneled inside your abdominal wall where cuffs provide a water-tight seal.  This leaves an outer foot of plastic tubing resting outside your body, usually in your lower abdomen below the beltline near your waist.  This is a quick procedure that can often be done as an outpatient with the possibility of staying overnight.  It takes several weeks for the catheter to heal into a watertight seal.  Initially, your nephrologist we will have the dialysis nurses do gentle flushes through the catheter.  Once the catheter is well sealed in, you will begin larger exchanges of fluid to do full peritoneal dialysis.  Your nephrologist and dialysis nurses will help guide you through this.  While placement of the catheter is usually not technically difficult, there are risks to the procedure.  The biggest risk is infection.  This is why we place  the catheter in the operating room with the use of intravenous antibiotics.  Using sterile technique to hook up the catheter to your dialysis fluids is essential.  Most infections of the catheter are mild and can be treated with antibiotics placed in the vein were placed with the peritoneal fluid.  However, sometimes the infection worsens or persists and the catheter needs to be removed.  Occasionally, the catheters can  be blocked due to inner organs plugging up the tubing or kinks.  Sometimes the catheter leaks and needs to be replaced.  Sometimes the inner abdomen has too many adhesions from prior surgeries or infections and there is not enough space for fluid exchanges to occur.  Sometimes, peritoneal dialysis is not enough to compensate for the kidney failure.   In rare instances, the inner lining of the abdomen becomes severely thickened or inflamed and peritoneal dialysis can no longer work.  Sometimes it is not possible to safely replace a new catheter and the patient must go on hemodialysis permanently.  It is very common to need hemodialysis intermittently even if you have a peritoneal dialysis catheter in cases of emergencies or if peritoneal dialysis fails.  Your nephrologist and surgeon can help you decide what the best form of dialysis is for you  PERITONEAL DIALYSIS (CAPD) CATHETER PLACEMENT:  POST OPERATIVE INSTRUCTIONS  1. DIET: Follow a light bland diet the first 24 hours after arrival home, such as soup, liquids, crackers, etc.  Be sure to include lots of fluids daily.  Avoid fast food or heavy meals as your are more likely to get nauseated.   2. Take your usually prescribed home medications unless otherwise directed. 3. PAIN CONTROL: a. Pain is best controlled by a usual combination of three different methods TOGETHER: i. Ice/Heat ii. Tylenol (over the counter pain medication) iii. Prescription pain medication b. Most patients will experience some swelling and bruising around the incisions.  Ice packs or heating pads (30-60 minutes up to 6 times a day) will help. Use ice for the first few days to help decrease swelling and bruising, then switch to heat to help relax tight/sore spots and speed recovery.  Some people prefer to use ice alone, heat alone, alternating between ice & heat.  Experiment to what works for you.  Swelling and bruising can take several weeks to resolve.   c. It is helpful to  take an over-the-counter pain medication regularly for the first few weeks.  Using acetaminophen (Tylenol, etc) 500-650mg  four times a day (every meal & bedtime) is usually safest since NSAIDs are not advisable in patients with kidney disease. d. A  prescription for pain medication (such as oxycodone, hydrocodone, etc) should be given to you upon discharge.  Take your pain medication as prescribed.  i. If you are having problems/concerns with the prescription medicine (does not control pain, nausea, vomiting, rash, itching, etc), please call us [phone] to see if we need to switch you to a different pain medicine that will work better for you and/or control your side effect better. ii. If you need a refill on your pain medication, please contact your pharmacy.  They will contact our office to request authorization. Prescriptions will not be filled after 5 pm or on week-ends. 4. Avoid getting constipated.  Between the surgery and the pain medications, it is common to experience some constipation.  Increasing fluid intake and taking a fiber supplement (such as Metamucil, Citrucel, FiberCon, MiraLax, etc) 1-2 times a day regularly will usually help  prevent this problem from occurring.  A mild laxative (prune juice, Milk of Magnesia, MiraLax, etc) should be taken according to package directions if there are no bowel movements after 48 hours.   5. Wash / shower every day.  You may shower over the dressings as they are waterproof.  Continue to shower over incision(s) after the dressing is off. 6. The Peritoneal Dialysis nurse will remove your waterproof bandages in the Dialysis Center a few days after surgery.  Do not remove the bandages until seen by them. 7. ACTIVITIES as tolerated:   a. You may resume regular (light) daily activities beginning the next day--such as daily self-care, walking, climbing stairs--gradually increasing activities as tolerated.  If you can walk 30 minutes without difficulty, it is  safe to try more intense activity such as jogging, treadmill, bicycling, low-impact aerobics, swimming, etc. b. Save the most intensive and strenuous activity for last such as sit-ups, heavy lifting, contact sports, etc  Refrain from any heavy lifting or straining until you are off narcotics for pain control.   c. DO NOT PUSH THROUGH PAIN.  Let pain be your guide: If it hurts to do something, don't do it.  Pain is your body warning you to avoid that activity for another week until the pain goes down. d. You may drive when you are no longer taking prescription pain medication, you can comfortably wear a seatbelt, and you can safely maneuver your car and apply brakes. e. Dennis Bast may have sexual intercourse when it is comfortable.  FOLLOW UP with the Peritoneal Dialysis nurses closely after surgery.  Call [phone] or your neprologist to help arrange training/flushes of your CAPD catheter    -The CAPD nurses & Nephrology usually follow you closely, making the need for follow-up in our office redundant and therefore not needed.  If they or you have concerns, please call us for possible follow-up in our office   -Please call [facility] at [phone] only as needed.  WHEN TO CALL us [phone]: 1. Poor pain control 2. Reactions / problems with new medications (rash/itching, nausea, etc)  3. Fever over 101.5 F (38.5 C) 4. Worsening swelling or bruising 5. Continued bleeding from incision. 6. Increased pain, redness, or drainage from the incision   The clinic staff is available to answer your questions during regular business hours (8:30am-5pm).  Please dont hesitate to call and ask to speak to one of our nurses for clinical concerns.   If you have a medical emergency, go to the nearest emergency room or call 911.  A surgeon from [facility] is always on call at the hospitals  9. IF YOU HAVE DISABILITY OR FAMILY LEAVE FORMS, BRING THEM TO THE OFFICE FOR PROCESSING.  DO NOT GIVE THEM TO  YOUR DOCTOR.  [facility], [address], [place], [place], [place]  [address] ? MAIN: [phone] ? TOLL FREE: [phone] ?  FAX [phone] [email or web address]  Peritoneal Dialysis - An Overview Dialysis can be done using a machine outside of the body (hemodialysis). Or, it can be done inside the body (peritoneal dialysis). The word "peritoneal" refers to the lining or membrane of the belly (abdominal cavity). The peritoneal membrane is a thin, plastic-like lining inside the belly that covers the organs and fits in the abdominal or peritoneal cavity, such as the stomach, liver and the kidneys. This lining works like a filter. It will allow certain things to pass from your blood through the lining and  into a special solution that has been placed into your belly. In this type of dialysis, the peritoneum is used to help clean the blood.  If you need dialysis, your kidneys are not working right. Healthy kidneys take out extra water and waste products, which becomes urine. When the kidneys do not do this, serious problems can develop. The waste and water build up in the blood. Your hands and feet might swell. You may feel tired, weak or sick to your stomach. Also, your blood pressure may rise. If not treated, you could die. Dialysis is a treatment that does the work that your kidneys would do if they were healthy.  It cleans your blood.   It will make sure your body has the right amount of certain chemicals that it needs. They include potassium, sodium and bicarbonate.   It will help control your blood pressure.  UNDERSTANDING PERITONEAL DIALYSIS  Here is how peritoneal dialysis works:   First, you will have surgery to put a soft plastic tube (catheter) into your belly (abdomen). This will allow you to easily connect yourself to special tubing, which will then let a special dialysis solution to be placed into your abdomen.   For each treatment, you will  need at least one bag of dialysis solution (a liquid called dialysate). It is a mix of water that is pure and free of germs (sterile), sugar (dextrose) and the nutrients and minerals found in your blood. Sometimes, more than one bag is needed to get the right amount of fluid for your abdomen. Your caregiver will explain what size and how many bags you will need.   The dialysate is slowly put through the catheter to fill the abdomen (called the peritoneal cavity). This dialysate will need to stay in your body for 3-4 hours. This is known as the dwell time.   The solution is working to clean the blood and remove wastes from your body. At the end of this time, the solution is drained from your body through tubing into an empty bag. It is then replaced with a fresh dialysate.   The draining and replacing of the dialysate is called an exchange or cycle. The catheter is capped after each exchange. Once the solution is in your body, you are then free to do whatever you would like until the next exchange. Most people will need to do 4-5 exchanges each day.   There are two different methods that can be used.   Continuous ambulatory peritoneal dialysis (CAPD): You put the solution into your abdomen, cap your catheter and then go about your day. Several hours later, you reconnect to a tubing set up, drain out the solution and then put more solution in. This is done several times a day. No machine is needed.   Continuous cycler-assisted peritoneal dialysis (CCPD): A machine is used, which fills the abdomen with dialysate and then drains it. This happens several times. It usually is done at night while you are sleeping. When you wake up, you can disconnect from the machine and are free to go to go about your day.  PREPARING FOR EXCHANGES  Discuss the details of the procedure with your caregivers. You will be working with a nurse who is specially trained in doing dialysis. Make sure you understand:   How to do an  exchange.   How much solution you need.   What type of solution you will need.   How often you should do an exchange. Ask:  How many times each day?   When? At meals? At bedtime?   Always keep the dialysate bags and other supplies in a cool, clean and dry place.   Keeping everything clean is very important.   The catheter and its cap must be free from germs (sterile)   The adapter also must be sterile. It attaches the dialysis bag and tubing to the catheter.   Clean the area of your body around the catheter every day. Use a chemical that fights infection (antiseptic).   Wash your hands thoroughly before starting an exchange.   You may be taught to wear a mask to cover your nose and mouth. This makes infection less likely to happen.   You may be taught to close doors, windows and turn off any fans before doing an exchange.   Check the dialysate bag very carefully.   Make sure it is the right size bag for you. This information is on the label.   Also, make sure it is the right mixture. For some people, the dialysate contents vary. For instance, the mixture might be a stronger solution for overnight.   Check the expiration date (the last date you can use the bag). It also is on the label. If the date has gone by, throw away the bag.   The solution should be clear. You should be able to see any writing on the side of the bag clearly through the solution. Do not use a cloudy solution.   Gently squeeze the bag to make sure there are no leaks.   Use a dry heating pad to warm the dialysate in the bag. Leave the cover on the bag while you do this.   This is for comfort. You can skip this step if you want.   Never place the bag of solution under warm or hot water. Water from a faucet is not sterile and could cause germs to get into the bag. Infection could then result.  PERFORMING AN EXCHANGE  For continuous ambulatory dialysis:   Attach the dialysis bag and tubing to your  catheter. Hang the bag so that gravity (the natural downward pull) draws the solution down and into your abdomen once the clamps are opened. This should take about 10 minutes.   Remove the bag and tubing from the catheter. Cap the catheter.   The solution stays in the abdomen for 3-4 hours (dwell time). The solution is working to clean the blood and remove wastes from your body.   When you are ready to drain the solution for another exchange, take the cap off the catheter. Then, attach the catheter to tubing, which is attached to an empty bag. Place this empty bag below the abdomen or on the floor or stool and undo the clamps.   Gravity helps pull the fluid out of the abdomen and into the bag. The fluid in the bag may look yellow and clear, like urine. It usually takes about 20 minutes to drain the fluid out of the abdomen.   When the solution has drained, start the process again by infusing a new bag of dialysate and then capping the catheter.   This should continue until you have used all of the solution that you are to use each day.   Sometimes, a small machine is used overnight. It is called a mini-cycler. This is done if the body cannot go all night without an exchange. The machine lets you sleep without having to get up and  do an exchange.   For continuous cycler-assisted dialysis:   You will be taught how to set up or program your machine.   When you are ready for bed, put the dialysate bags onto the cycler machine. Put on exactly the number of bags that your caregiver said to use.   Connect your catheter to the machine and turn the cycler machine on.   Overnight, the cycler will do several exchanges. It often does three to five, sometimes more.   Solution that is in your abdomen in the morning will stay during the day. The machine is set to make the daytime solution stronger, if that is needed.   In the morning, you will disconnect from the machine and cap your catheter and go  about your day.   Sometimes, an extra exchange is done during the day. This may be needed to remove excess waste or fluid.  IMPORTANT REMINDERS  You will need to follow a very strict schedule. Every step of the dialysis procedure must be done every day. Sometimes, several times a day. Altogether, this might take an extra 2 hours or more. However, you must stick to the routine. Do not skip a day. Do not skip a procedure.   Some people find it helpful to work with a Social worker or Education officer, museum in addition to the renal (kidney) nurse. They can help you figure out how to change your daily routine to fit in the dialysis sessions.   You may need to change your diet. Ask your caregiver for advice, or talk with a nutritionist about what you should and should not eat.   You will need to weigh yourself every day and keep track of what your weight is.   You may be taught how to check your blood pressure before every exchange. Your blood pressure reading will help determine what type of solution to use. If your blood pressure is too high, you may need a stronger solution.  RISKS AND COMPLICATIONS  Possible problems vary, depending on the method you use. Your overall health also can have an effect. Problems that could develop because of dialysis include:  Infection. This is the most common problem. It could occur:   In the peritoneum. This is called peritonitis.   Around the catheter.   Weight gain. The dialysate contains a type of sugar known as dextrose. Dextrose has a lot of calories. The body takes in several hundred calories from this sugar each day.   Weakened muscles in the abdomen. This can result from all of the fluid that your body has to hold in the abdomen.   Catheter replacement. Sometimes, a new one has to be put in.   Change in dialysis method. Due to some complications, you may need to change to hemodialysis for a short time and have your dialysis done at a center.   Trouble  adjusting to your new lifestyle. In some people, this leads to depression.   Sleep problems.   Dialysis-related amyloidosis. This sometimes occurs after 5 years of dialysis. Protein builds up in the blood. This can cause painful deposits on bones, joints and tendons (which connect muscle to bone). Or, it can cause hollow spots in bones that make them more likely to break.   Excess fluid. Your body may absorb too much of the fluid that is held in the abdomen. This can lead to heart or lung problems.  SEEK MEDICAL CARE IF:   You have any problems with an exchange.  The area around the catheter becomes red or painful.   The catheter seems loose, or it feels like it is coming out.   A bag of dialysate looks cloudy. Or, the liquid is an unusual color.   Abdominal pain or discomfort.   You feel sick to your stomach (nauseous) or throw up (vomit).   You develop a fever of more than 102 F (38.9 C).  SEEK IMMEDIATE MEDICAL CARE IF:  You develop a fever of more than 102 F (38.9 C). Document Released: 02/10/2009 Document Revised: 04/04/2011 Document Reviewed: 02/10/2009 [facility] Patient Information 2012 [place].  Diet for Peritoneal Dialysis This diet may be modified in protein, sodium, phosphorus, potassium, or fluid, depending on your needs. The goals of nutrition therapy are similar to those for patients on hemodialysis. Providing enough protein to replace peritoneal losses is a priority. USES OF THIS DIET The diet is designed for the patient with end-stage kidney (renal) disease, who is treated by peritoneal dialysis. Treatment options include:  Continuous Ambulatory Peritoneal Dialysis (CAPD): Usually 4 exchanges of 1.5 to 2 liter volumes of glucose (sugar) and electrolyte-containing dialysate.   Continuous Cyclic Peritoneal Dialysis (CCPD): Essentially a reversal of CAPD, with shorter exchanges at night and a longer one during the day.   Intermittent Peritoneal Dialysis  (IPD): 10 to 12 hours of exchanges, 2 to 3 times weekly.  ADEQUACY The diet may not meet the Recommended Dietary Allowances of the [employer] for calcium and ascorbic acid. Protein and water-soluble vitamin needs may be increased because of losses into the dialysate. Recommended daily supplements are the same as for hemodialysis patients. ASSESSMENT/DETERMINATION OF DIET Dietary needs will differ between patients. Parameters must be individualized. Protein  Guidelines: 1.2 to 1.3 gm/kg/day OR 1.5 gm/kg/day if patient is malnourished, catabolic, or has a protracted episode of peritonitis. A minimum of 50% of the protein intake should be of high biological value.   Goals: Meet protein requirements and replace dialysate losses while avoiding excessive accumulation of waste products. Achieve serum albumin greater than 3.5 g/dL.   Evaluate: Current nutritional status, serum albumin and BUN levels, presence of peritonitis.  Sodium  Guidelines: Usually 90 to 175 mEq (2000 to 4000 mg), but should be individualized.   Goals: Minimize complications of fluid imbalance.   Evaluate: Weight, blood pressure regulation, and presence of swelling (edema).  Potassium  Guidelines: Individualized; often not restricted, and may need to be supplemented.   Goals: Serum K+ levels between 4.0 to 5.0 mEq/L.   Evaluate: Serum K+ levels, usual intake of K+, appetite.  Phosphorus  Guidelines: 800 to 1200 mg/day (the high protein intake results in a high obligatory P intake).   Goal: Serum P levels between 4.5 to 6.0 mg/dL.   Evaluate: Serum P levels, usual P intake, P-binding medications: type, number, dosage, distribution.  Fluids  Guidelines: Individualized - may not be restricted for all patients.   Goal: Minimize complications of fluid imbalance.   Evaluate: Weight, blood pressure regulation, sodium intake, and presence of edema.  Document Released: 04/15/2005 Document Revised:  04/04/2011 Document Reviewed: 07/08/2006 [facility] Patient Information 2012 [place].

## 2013-09-08 NOTE — Anesthesia Procedure Notes (Signed)
Procedure Name: Intubation Date/Time: 09/08/2013 3:57 PM Performed by: Barrington Ellison Pre-anesthesia Checklist: Patient identified, Emergency Drugs available, Suction available, Patient being monitored and Timeout performed Patient Re-evaluated:Patient Re-evaluated prior to inductionOxygen Delivery Method: Circle system utilized Preoxygenation: Pre-oxygenation with 100% oxygen Intubation Type: IV induction Ventilation: Mask ventilation without difficulty Laryngoscope Size: Mac and 3 Grade View: Grade I Tube type: Oral Tube size: 7.0 mm Number of attempts: 3 Airway Equipment and Method: Stylet Placement Confirmation: ETT inserted through vocal cords under direct vision,  positive ETCO2 and breath sounds checked- equal and bilateral Secured at: 21 cm Tube secured with: Tape Dental Injury: Teeth and Oropharynx as per pre-operative assessment  Difficulty Due To: Difficulty was unanticipated and Difficult Airway- due to dentition Comments: DL x 1 by CRNA, no view, DL x 2 by Dr. Conrad [place] inserted into esophagus first attempt, repositioned patient had Grade 1 view by Dr. Conrad [place] on second attempt.

## 2013-09-08 NOTE — Anesthesia Postprocedure Evaluation (Signed)
Anesthesia Post Note  Patient: Priscilla [name]  Procedure(s) Performed: Procedure(s) (LRB): Laparoscopic CAPD peritoneal dialysis catheter placement, possible lysis of adhesions    (N/A)  Anesthesia type: general  Patient location: PACU  Post pain: Pain level controlled  Post assessment: Patient's Cardiovascular Status Stable  Last Vitals:  Filed Vitals:   09/08/13 1815  BP: 150/74  Pulse: 81  Temp:   Resp: 16    Post vital signs: Reviewed and stable  Level of consciousness: sedated  Complications: No apparent anesthesia complications

## 2013-09-08 NOTE — Op Note (Signed)
09/08/2013  5:36 PM  PATIENT:  Priscilla [name]  43 y.o. female  Patient Care Team: [name], MD as PCP - General (Internal Medicine) Conrad Belgium, MD as Consulting Physician (Vascular Surgery) Rexene Agent, MD as Consulting Physician (Nephrology)  PRE-OPERATIVE DIAGNOSIS:  End-stage renal disease, dialysis dependent.  Request for peritoneal dialysis  POST-OPERATIVE DIAGNOSIS:  End-stage renal disease, dialysis dependent.  Request for peritoneal dialysis  PROCEDURE:  Procedure(s): Laparoscopic CAPD peritoneal dialysis catheter placement Sutured pexy of CAPD catheter to pelvis x2 stitches     SURGEON:  Surgeon(s): Adin Hector, MD  ASSISTANT: Darcella Gasman, PA-student, [facility]   ANESTHESIA:   local and general  EBL:     Delay start of Pharmacological VTE agent (>24hrs) due to surgical blood loss or risk of bleeding:  no  DRAINS: CAPD peritoneal dialysis catheter (Med[employer] [id number] curl cath with titanium extender) .  The curl tip of the catheter rests in the deep pelvis.   Entry into the peritoneum it is in the left suprapubic region just above the dome of the bladder.  Deepest cuff in the preperitoneal space at this location.  The exit site of the catheter on the skin is in the left upper abdomen subcostal region, 5 cm inferior to costal ridge, lateral clavicular line.  SPECIMEN:  No Specimen  DISPOSITION OF SPECIMEN:  N/A  COUNTS:  YES  PLAN OF CARE: Discharge to home after PACU  PATIENT DISPOSITION:  PACU - hemodynamically stable.  INDICATION: Pt with prior CAPD catheter with repositioning.  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  Wished to retry CAPD  The anatomy & physiology of peritoneum was discussed.  Natural history risks without surgery of worsening renal failure was discussed.   I feel the risks of no intervention will lead to serious problems that outweigh the operative risks;  therefore, I recommended placement of a peritoneal dialysis catheter.  I explained laparoscopic techniques with possible need for an open approach.    Risks such as bleeding, infection, abscess, injury to other organs, catheter occlusion or malpositioning, reoperation to remove/reposition the catheter, heart attack, death, and other risks were discussed.   I noted a good likelihood this will help address the problem.  Possibility that this will not be enough to compensate for the renal failure & need for further treatment such as hemodialysis was explained.  Goals of post-operative recovery were discussed as well.  We will work to minimize complications.   The patient is/will be getting training on catheter use by dialysis nursing before and after surgery.  I stressed the importance of meticulous care & sterile technique to prevent catheter problems.  Questions were answered.  The patient expresses understanding & wishes to proceed with surgery.  OR FINDINGS:   It is a long tunneled CAPD peritoneal dialysis catheter ([employer] [id number] curl cath with titanium extender) .  The curl tip of the catheter rests in the deep pelvis.   Entry into the peritoneum it is in the left suprapubic region just above the dome of the bladder.  Deepest cuff in the preperitoneal space at this location.  The exit site of the catheter on the skin is in the left upper abdomen subcostal region, 5 cm inferior to costal ridge, lateral clavicular line.  DESCRIPTION:   Informed consent was confirmed.  The patient underwent general anaesthesia without difficulty.  The patient was positioned appropriately.  VTE prevention in place.  The patient's  abdomen was clipped, prepped, & draped in a sterile fashion.  Surgical timeout confirmed our plan.  The patient was positioned in reverse Trendelenburg.  Abdominal entry was gained using optical entry technique in the right upper abdomen.  Entry was clean.  I induced carbon dioxide  insufflation.  Camera inspection revealed no injury.  Extra ports were carefully placed under direct laparoscopic visualization.   I saw no hernias.  The omentum had adhesions in the upper abdomen keeping it from reaching into the infraumbilical abdomen.  I left them alone.  I proceeded with placement of the peritoneal dialysis catheter.   I tunneled a 66mm dilating port obliquely through the abdominal wall from a left lower quadrant paramedian skin incision inferiorly, splitting the left infraumbililcal rectus muscle to exit into the peritoneum just cephalad to the dome of the bladder.  I placed the CAPD catheter through that port.  The curl of the CAPD catheter rested down in the cul de sac of the mid pelvis.   The very deep pelvis with walled off due to adhesions.  I left them alone.  She had a very tiny peritoneal cavity.  I used Prolene stitch x2 to help sling the Curl tip of the catheter to the dome of the bladder & left pelvic wall to make the curl tip stay down into the true pelvis and keep it from flipping up into the lower abdomen.  The catheter flushed & aspirated 257mL saline well.  No heparin was used since the patient refused heparin.  The deep cuff rested in the pre-peritoneal space.  The Port was removed to allow the external end to exit the skin of the port site.  I connected the upper double-cuff extra long catheter extention tubing using the [employer] titanium extender connector.    I tunneled the extended catheter using a long vascular tunneler with a blunt bullet-screw-on tip tunneler.  I tunnelled the catheter from the LLQ 34mm port exit site though the abdominal wall out the abdominal wall in the medial clavicular line 3 cm from the costal ridge .  The titanium connector was buried in the left perimedian periumbilical region of the abdominal wall with that tunneling done.  I left the proximal superficial cuff just under the skin inferior to that incision.  I used a Dispensing optician ([employer]  [id number], 50 degree bend) spike-tip tunneler to tunnel the remaining tubing inferiorly to exit the skin at the left lateral clavicular line, 5 cm inferior to the costal ridge.  The most distal cuff rests superior to the final exit site in the SQ.  There was a mild kink at the region was able to unkink in and let it softly curve.       Hemostasis was excellent.  The catheter flushed and aspirated easily with saline into the CAPD catheter.  I removed the ports.  I closed the skin using 4-0 monocryl stitch.  Sterile dressings were applied. The patient was extubated & arrived in the PACU in stable condition..  I had discussed postoperative care with the patient in the holding area. I am about to locate the patient's family and discuss operative findings and postoperative goals / instructions.  Instructions are written in the chart as well.  The patient will require close followup for flushing/management of a peritoneal dialysis catheter through [facility].

## 2013-09-08 NOTE — Interval H&P Note (Signed)
History and Physical Interval Note:  09/08/2013 1:24 PM  Priscilla [name]  has presented today for surgery, with the diagnosis of End-stage renal disease, dialysis dependent.  Request for peritoneal dialysis  The various methods of treatment have been discussed with the patient and family. After consideration of risks, benefits and other options for treatment, the patient has consented to  Procedure(s): Laparoscopic CAPD peritoneal dialysis catheter placement, possible lysis of adhesions    (N/A) as a surgical intervention .  The patient's history has been reviewed, patient examined, no change in status, stable for surgery.  I have reviewed the patient's chart and labs.  Questions were answered to the patient's satisfaction.     Adin Hector

## 2013-09-08 NOTE — Anesthesia Preprocedure Evaluation (Addendum)
Anesthesia Evaluation  Patient identified by MRN, date of birth, ID band  Reviewed: Allergy & Precautions, H&P , NPO status , Patient's Chart, lab work & pertinent test results  Airway Mallampati: II TM Distance: >3 FB Neck ROM: Full    Dental  (+) Teeth Intact, Dental Advisory Given   Pulmonary          Cardiovascular hypertension, Pt. on medications Rhythm:Regular     Neuro/Psych    GI/Hepatic GERD-  Medicated and Controlled,  Endo/Other  diabetes, Type 2  Renal/GU Dialysis and ESRFRenal disease     Musculoskeletal   Abdominal   Peds  Hematology   Anesthesia Other Findings   Reproductive/Obstetrics                         Anesthesia Physical Anesthesia Plan  ASA: III  Anesthesia Plan: General   Post-op Pain Management:    Induction: Intravenous  Airway Management Planned: Oral ETT  Additional Equipment:   Intra-op Plan:   Post-operative Plan: Extubation in OR  Informed Consent:   Plan Discussed with:   Anesthesia Plan Comments:         Anesthesia Quick Evaluation

## 2013-09-10 ENCOUNTER — Encounter (HOSPITAL_COMMUNITY): Payer: Self-pay | Admitting: Surgery

## 2013-09-22 ENCOUNTER — Encounter (INDEPENDENT_AMBULATORY_CARE_PROVIDER_SITE_OTHER): Payer: Self-pay

## 2014-01-27 ENCOUNTER — Other Ambulatory Visit: Payer: Self-pay

## 2014-01-27 ENCOUNTER — Other Ambulatory Visit: Payer: Self-pay | Admitting: Nephrology

## 2014-01-27 DIAGNOSIS — Z1239 Encounter for other screening for malignant neoplasm of breast: Secondary | ICD-10-CM

## 2014-02-09 ENCOUNTER — Ambulatory Visit
Admission: RE | Admit: 2014-02-09 | Discharge: 2014-02-09 | Disposition: A | Discharge status: Home / Self Care | Payer: Self-pay | Source: Ambulatory Visit | Attending: Nephrology | Admitting: Nephrology

## 2014-02-09 DIAGNOSIS — Z1239 Encounter for other screening for malignant neoplasm of breast: Secondary | ICD-10-CM

## 2014-04-21 ENCOUNTER — Ambulatory Visit (INDEPENDENT_AMBULATORY_CARE_PROVIDER_SITE_OTHER): Payer: No Typology Code available for payment source | Admitting: Family Medicine

## 2014-04-21 ENCOUNTER — Ambulatory Visit (INDEPENDENT_AMBULATORY_CARE_PROVIDER_SITE_OTHER): Payer: No Typology Code available for payment source

## 2014-04-21 VITALS — BP 114/72 | HR 78 | Temp 98.1°F | Resp 18 | Ht 61.0 in | Wt 126.0 lb

## 2014-04-21 DIAGNOSIS — N189 Chronic kidney disease, unspecified: Secondary | ICD-10-CM

## 2014-04-21 DIAGNOSIS — R059 Cough, unspecified: Secondary | ICD-10-CM

## 2014-04-21 DIAGNOSIS — R05 Cough: Secondary | ICD-10-CM

## 2014-04-21 DIAGNOSIS — R0989 Other specified symptoms and signs involving the circulatory and respiratory systems: Secondary | ICD-10-CM

## 2014-04-21 DIAGNOSIS — J988 Other specified respiratory disorders: Secondary | ICD-10-CM

## 2014-04-21 DIAGNOSIS — J22 Unspecified acute lower respiratory infection: Secondary | ICD-10-CM

## 2014-04-21 DIAGNOSIS — E1022 Type 1 diabetes mellitus with diabetic chronic kidney disease: Secondary | ICD-10-CM

## 2014-04-21 DIAGNOSIS — N186 End stage renal disease: Secondary | ICD-10-CM

## 2014-04-21 DIAGNOSIS — Z992 Dependence on renal dialysis: Secondary | ICD-10-CM

## 2014-04-21 MED ORDER — BENZONATATE 100 MG PO CAPS: 200.0000 mg | ORAL_CAPSULE | Freq: Two times a day (BID) | ORAL | Status: DC | PRN

## 2014-04-21 MED ORDER — LEVOFLOXACIN 500 MG PO TABS: ORAL_TABLET | ORAL | Status: DC

## 2014-04-21 MED ORDER — HYDROCODONE-HOMATROPINE 5-1.5 MG/5ML PO SYRP: 5.0000 mL | ORAL_SOLUTION | Freq: Every evening | ORAL | Status: DC | PRN

## 2014-04-21 NOTE — Patient Instructions (Signed)
Levofloxacin tablets What is this medicine? LEVOFLOXACIN (lee voe FLOX a sin) is a quinolone antibiotic. It is used to treat certain kinds of bacterial infections. It will not work for colds, flu, or other viral infections. This medicine may be used for other purposes; ask your health care provider or pharmacist if you have questions. COMMON BRAND NAME(S): Levaquin, Levaquin Leva-Pak What should I tell my health care provider before I take this medicine? They need to know if you have any of these conditions: -cerebral disease -irregular heartbeat -kidney disease -seizure disorder -an unusual or allergic reaction to levofloxacin, other antibiotics or medicines, foods, dyes, or preservatives -pregnant or trying to get pregnant -breast-feeding How should I use this medicine? Take this medicine by mouth with a full glass of water. Follow the directions on the prescription label. This medicine can be taken with or without food. Take your medicine at regular intervals. Do not take your medicine more often than directed. Do not skip doses or stop your medicine early even if you feel better. Do not stop taking except on your doctor's advice. A special MedGuide will be given to you by the pharmacist with each prescription and refill. Be sure to read this information carefully each time. Talk to your pediatrician regarding the use of this medicine in children. While this drug may be prescribed for children as young as 6 months for selected conditions, precautions do apply. Overdosage: If you think you have taken too much of this medicine contact a poison control center or emergency room at once. NOTE: This medicine is only for you. Do not share this medicine with others. What if I miss a dose? If you miss a dose, take it as soon as you remember. If it is almost time for your next dose, take only that dose. Do not take double or extra doses. What may interact with this medicine? Do not take this medicine  with any of the following medications: -arsenic trioxide -chloroquine -droperidol -medicines for irregular heart rhythm like amiodarone, disopyramide, dofetilide, flecainide, quinidine, procainamide, sotalol -some medicines for depression or mental problems like phenothiazines, pimozide, and ziprasidone This medicine may also interact with the following medications: -amoxapine -antacids -birth control pills -cisapride -dairy products -didanosine (ddI) buffered tablets or powder -haloperidol -multivitamins -NSAIDS, medicines for pain and inflammation, like ibuprofen or naproxen -retinoid products like tretinoin or isotretinoin -risperidone -some other antibiotics like clarithromycin or erythromycin -sucralfate -theophylline -warfarin This list may not describe all possible interactions. Give your health care provider a list of all the medicines, herbs, non-prescription drugs, or dietary supplements you use. Also tell them if you smoke, drink alcohol, or use illegal drugs. Some items may interact with your medicine. What should I watch for while using this medicine? Tell your doctor or health care professional if your symptoms do not improve or if they get worse. Drink several glasses of water a day and cut down on drinks that contain caffeine. You must not get dehydrated while taking this medicine. You may get drowsy or dizzy. Do not drive, use machinery, or do anything that needs mental alertness until you know how this medicine affects you. Do not sit or stand up quickly, especially if you are an older patient. This reduces the risk of dizzy or fainting spells. This medicine can make you more sensitive to the sun. Keep out of the sun. If you cannot avoid being in the sun, wear protective clothing and use a sunscreen. Do not use sun lamps or tanning beds/booths.   Contact your doctor if you get a sunburn. If you are a diabetic monitor your blood glucose carefully. If you get an unusual  reading stop taking this medicine and call your doctor right away. Do not treat diarrhea with over-the-counter products. Contact your doctor if you have diarrhea that lasts more than 2 days or if the diarrhea is severe and watery. Avoid antacids, calcium, iron, and zinc products for 2 hours before and 2 hours after taking a dose of this medicine. What side effects may I notice from receiving this medicine? Side effects that you should report to your doctor or health care professional as soon as possible: -allergic reactions like skin rash or hives, swelling of the face, lips, or tongue -changes in vision -confusion, nightmares or hallucinations -difficulty breathing -irregular heartbeat, chest pain -joint, muscle or tendon pain -pain or difficulty passing urine -persistent headache with or without blurred vision -redness, blistering, peeling or loosening of the skin, including inside the mouth -seizures -unusual pain, numbness, tingling, or weakness -vaginal irritation, discharge Side effects that usually do not require medical attention (report to your doctor or health care professional if they continue or are bothersome): -diarrhea -dry mouth -headache -stomach upset, nausea -trouble sleeping This list may not describe all possible side effects. Call your doctor for medical advice about side effects. You may report side effects to FDA at 1-800-FDA-1088. Where should I keep my medicine? Keep out of the reach of children. Store at room temperature between 15 and 30 degrees C (59 and 86 degrees F). Keep in a tightly closed container. Throw away any unused medicine after the expiration date. NOTE: This sheet is a summary. It may not cover all possible information. If you have questions about this medicine, talk to your doctor, pharmacist, or health care provider.  2015, [employer]vier/Gold Standard. (2012-11-20 07:45:07)    Pneumonia Pneumonia is an infection of the lungs.   CAUSES Pneumonia may be caused by bacteria or a virus. Usually, these infections are caused by breathing infectious particles into the lungs (respiratory tract). SIGNS AND SYMPTOMS   Cough.  Fever.  Chest pain.  Increased rate of breathing.  Wheezing.  Mucus production. DIAGNOSIS  If you have the common symptoms of pneumonia, your health care provider will typically confirm the diagnosis with a chest X-ray. The X-ray will show an abnormality in the lung (pulmonary infiltrate) if you have pneumonia. Other tests of your blood, urine, or sputum may be done to find the specific cause of your pneumonia. Your health care provider may also do tests (blood gases or pulse oximetry) to see how well your lungs are working. TREATMENT  Some forms of pneumonia may be spread to other people when you cough or sneeze. You may be asked to wear a mask before and during your exam. Pneumonia that is caused by bacteria is treated with antibiotic medicine. Pneumonia that is caused by the influenza virus may be treated with an antiviral medicine. Most other viral infections must run their course. These infections will not respond to antibiotics.  HOME CARE INSTRUCTIONS   Cough suppressants may be used if you are losing too much rest. However, coughing protects you by clearing your lungs. You should avoid using cough suppressants if you can.  Your health care provider may have prescribed medicine if he or she thinks your pneumonia is caused by bacteria or influenza. Finish your medicine even if you start to feel better.  Your health care provider may also prescribe an expectorant. This loosens  the mucus to be coughed up.  Take medicines only as directed by your health care provider.  Do not smoke. Smoking is a common cause of bronchitis and can contribute to pneumonia. If you are a smoker and continue to smoke, your cough may last several weeks after your pneumonia has cleared.  A cold steam vaporizer or  humidifier in your room or home may help loosen mucus.  Coughing is often worse at night. Sleeping in a semi-upright position in a recliner or using a couple pillows under your head will help with this.  Get rest as you feel it is needed. Your body will usually let you know when you need to rest. PREVENTION A pneumococcal shot (vaccine) is available to prevent a common bacterial cause of pneumonia. This is usually suggested for:  People over 75 years old.  Patients on chemotherapy.  People with chronic lung problems, such as bronchitis or emphysema.  People with immune system problems. If you are over 65 or have a high risk condition, you may receive the pneumococcal vaccine if you have not received it before. In some countries, a routine influenza vaccine is also recommended. This vaccine can help prevent some cases of pneumonia.You may be offered the influenza vaccine as part of your care. If you smoke, it is time to quit. You may receive instructions on how to stop smoking. Your health care provider can provide medicines and counseling to help you quit. SEEK MEDICAL CARE IF: You have a fever. SEEK IMMEDIATE MEDICAL CARE IF:   Your illness becomes worse. This is especially true if you are elderly or weakened from any other disease.  You cannot control your cough with suppressants and are losing sleep.  You begin coughing up blood.  You develop pain which is getting worse or is uncontrolled with medicines.  Any of the symptoms which initially brought you in for treatment are getting worse rather than better.  You develop shortness of breath or chest pain. MAKE SURE YOU:   Understand these instructions.  Will watch your condition.  Will get help right away if you are not doing well or get worse. Document Released: 04/15/2005 Document Revised: 08/30/2013 Document Reviewed: 07/05/2010 [facility] Patient Information 2015 [place], [place]. This information is not intended to replace  advice given to you by your health care provider. Make sure you discuss any questions you have with your health care provider.

## 2014-04-21 NOTE — Progress Notes (Signed)
Chief Complaint:  Chief Complaint  Patient presents with  . chest congestion    x3 days no fever  . Cough    HPI: Priscilla [name] is a 43 y.o. female who is here for  Chest congestion and cough, no fevers or chills She has T1DM insulin dependent and had a failed kidney transplant She is on peritoneal dialysis . She isnot feelign her normal self, feels slightly tired and has cough and congestion. She is not getting good rest. The cough is prulent slightly whitish yellow.  She has not tried anything for this   Past Medical History  Diagnosis Date  . H/O kidney transplant   . Pancreas transplanted   . Diabetes mellitus without complication   . Hypertension   . Renal disorder   . Anemia   . GERD (gastroesophageal reflux disease)    Past Surgical History  Procedure Laterality Date  . 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  Pancreas left mid abdomen  . Cesarean section    . Laparoscopic insertion peritoneal catheter  07/18/2008    Dr Excell Seltzer  . Laparoscopic repositioning capd catheter  Oct 30, 202010    Dr Excell Seltzer  . Laparoscopic repositioning capd catheter  11/08/2008    Dr Excell Seltzer  . Omentectomy  11/08/2008    Dr Excell Seltzer  . Laparoscopic repositioning capd catheter  10/03/2009    Unplugging CAPD catheter Dr Excell Seltzer  . Insertion of dialysis catheter  September 28, 2012    Right upper chest  . Capd insertion N/A 09/08/2013    Procedure: Laparoscopic CAPD peritoneal dialysis catheter placement, possible lysis of adhesions   ;  Surgeon: Adin Hector, MD;  Location: [place];  Service: General;  Laterality: N/A;   History   Social History  . Marital Status: Married    Spouse Name: N/A    Number of Children: N/A  . Years of Education: N/A   Social History Main Topics  . Smoking status: Never Smoker   . Smokeless tobacco: Never Used  . Alcohol Use: No  . Drug Use: No  . Sexual Activity: Yes    Birth Control/ Protection: None    Other Topics Concern  . None   Social History Narrative   Family History  Problem Relation Age of Onset  . CAD Neg Hx   . Stroke Mother    Allergies  Allergen Reactions  . Pork-Derived Products     Pt is [place]   Prior to Admission medications   Medication Sig Start Date End Date Taking? Authorizing Provider  carvedilol (COREG) 6.25 MG tablet Take 6.25 mg by mouth daily.   Yes Historical Provider, MD  gabapentin (NEURONTIN) 100 MG capsule Take 100 mg by mouth daily.   Yes Historical Provider, MD  hydrALAZINE (APRESOLINE) 50 MG tablet Take 50 mg by mouth 2 (two) times daily.   Yes Historical Provider, MD  insulin glargine (LANTUS) 100 UNIT/ML injection Inject 10 Units into the skin at bedtime.    Yes Historical Provider, MD  insulin lispro (HUMALOG) 100 UNIT/ML injection Inject 100 Units into the skin 3 (three) times daily before meals.   Yes Historical Provider, MD  LISINOPRIL PO Take by mouth.   Yes Historical Provider, MD  losartan (COZAAR) 100 MG tablet Take 100 mg by mouth daily.   Yes Historical Provider, MD  acetaminophen (TYLENOL) 325 MG tablet Take 650 mg by mouth every 4 (four) hours as needed for mild pain.  Historical Provider, MD  amLODipine (NORVASC) 5 MG tablet Take 5 mg by mouth daily.  10/19/12 10/19/13  Historical Provider, MD  aspirin 325 MG tablet Take 325 mg by mouth daily.  05/31/10   Historical Provider, MD  multivitamin (RENA-VIT) TABS tablet Take 1 tablet by mouth daily.    Historical Provider, MD  mycophenolate (MYFORTIC) 180 MG EC tablet Take 360 mg by mouth 2 (two) times daily.     Historical Provider, MD  omeprazole (PRILOSEC) 20 MG capsule Take 20 mg by mouth daily.  09/29/12 09/29/13  Historical Provider, MD  simvastatin (ZOCOR) 5 MG tablet Take 5 mg by mouth at bedtime.  09/29/12   Historical Provider, MD  SODIUM BICARBONATE PO Take 2 tablets by mouth 2 (two) times daily.    Historical Provider, MD  tacrolimus (PROGRAF) 1 MG capsule Take 5-5.5 mg by mouth 2  (two) times daily. 5 1/2mg   in morning, and 5mg  tablets in evening.    Historical Provider, MD  traMADol (ULTRAM) 50 MG tablet Take 1-2 tablets (50-100 mg total) by mouth every 6 (six) hours as needed for moderate pain or severe pain. Patient not taking: Reported on 04/21/2014 09/08/13   Michael Boston, MD     ROS: The patient denies fevers, chills, night sweats, unintentional weight loss, chest pain, palpitations, wheezing, dyspnea on exertion, nausea, vomiting, abdominal pain, dysuria, hematuria, melena, numbness, weakness, or tingling.  All other systems have been reviewed and were otherwise negative with the exception of those mentioned in the HPI and as above.    PHYSICAL EXAM: Filed Vitals:   04/21/14 1026  BP: 114/72  Pulse: 78  Temp: 98.1 F (36.7 C)  Resp: 18   Filed Vitals:   04/21/14 1026  Height: 5\' 1"  (1.549 m)  Weight: 126 lb (57.153 kg)   Body mass index is 23.82 kg/(m^2). SpO2 Readings from Last 3 Encounters:  04/21/14 94%  09/08/13 99%  09/01/13 99%    General: Alert, no acute distress HEENT:  Normocephalic, atraumatic, oropharynx patent. EOMI, PERRLA Cardiovascular:  Regular rate and rhythm, no rubs murmurs or gallops.  No Carotid bruits, radial pulse intact. No pedal edema.  Respiratory: Clear to auscultation bilaterally.  No wheezes, rales, or rhonchi.  No cyanosis, no use of accessory musculature GI: No organomegaly, abdomen is soft and non-tender, positive bowel sounds.  No masses. Skin: No rashes. Neurologic: Facial musculature symmetric. Psychiatric: Patient is appropriate throughout our interaction. Lymphatic: No cervical lymphadenopathy Musculoskeletal: Gait intact.   LABS: Results for orders placed or performed during the hospital encounter of 09/08/13  Glucose, capillary  Result Value Ref Range   Glucose-Capillary 81 70 - 99 mg/dL  Glucose, capillary  Result Value Ref Range   Glucose-Capillary 79 70 - 99 mg/dL  Glucose, capillary  Result  Value Ref Range   Glucose-Capillary 73 70 - 99 mg/dL   Comment 1 Notify RN   I-STAT 4, (NA,K, GLUC, HGB,HCT)  Result Value Ref Range   Sodium 138 137 - 147 mEq/L   Potassium 4.0 3.7 - 5.3 mEq/L   Glucose, Bld 81 70 - 99 mg/dL   HCT 46.0 36.0 - 46.0 %   Hemoglobin 15.6 (H) 12.0 - 15.0 g/dL     EKG/XRAY:   Primary read interpreted by Dr. Marin Comment at [facility]. ? Right lower lobe infiltrate vs increase vasc markings    ASSESSMENT/PLAN: Encounter Diagnoses  Name Primary?  . Cough   . Lower respiratory infection (e.g., bronchitis, pneumonia, pneumonitis, pulmonitis) Yes  . Chest  congestion   . ESRD on dialysis   . Type 1 diabetes mellitus with diabetic chronic kidney disease    Rx levaquin at renal dosing for CPAD Rx tessalon perles and hycodan F/u in 48 hrs if no improvement  Gross sideeffects, risk and benefits, and alternatives of medications d/w patient. Patient is aware that all medications have potential sideeffects and we are unable to predict every sideeffect or drug-drug interaction that may occur.  Caniyah Murley, [place], DO 04/21/2014 11:59 AM   12/29.15 @ 12:47   Called patient and LM on cell phone to see how she is doing. Left message about chest xray results and need f/u sooner if worse otherwise repeat chest xray in 4 weeks.   IMPRESSION: Increased generalized interstitial prominence with small bilateral pleural effusions suspicious for congestive heart failure and/or volume overload. Superimposed early basilar pneumonia cannot be excluded.   Electronically Signed  By: Camie Patience M.D.  On: 04/21/2014 12:09

## 2014-05-06 ENCOUNTER — Other Ambulatory Visit (HOSPITAL_COMMUNITY): Payer: Self-pay | Admitting: Nephrology

## 2014-05-06 ENCOUNTER — Ambulatory Visit (HOSPITAL_COMMUNITY): Payer: PRIVATE HEALTH INSURANCE | Attending: Cardiovascular Disease | Admitting: Radiology

## 2014-05-06 DIAGNOSIS — R011 Cardiac murmur, unspecified: Secondary | ICD-10-CM | POA: Insufficient documentation

## 2014-05-06 NOTE — Progress Notes (Signed)
Echocardiogram performed.  

## 2014-06-23 ENCOUNTER — Encounter: Payer: Self-pay | Admitting: Cardiovascular Disease

## 2014-06-23 ENCOUNTER — Ambulatory Visit (INDEPENDENT_AMBULATORY_CARE_PROVIDER_SITE_OTHER): Payer: PRIVATE HEALTH INSURANCE | Admitting: Cardiovascular Disease

## 2014-06-23 VITALS — BP 122/70 | HR 82 | Resp 16 | Ht 60.0 in | Wt 123.6 lb

## 2014-06-23 DIAGNOSIS — I10 Essential (primary) hypertension: Secondary | ICD-10-CM

## 2014-06-23 DIAGNOSIS — Z79899 Other long term (current) drug therapy: Secondary | ICD-10-CM

## 2014-06-23 DIAGNOSIS — I509 Heart failure, unspecified: Secondary | ICD-10-CM

## 2014-06-23 DIAGNOSIS — E785 Hyperlipidemia, unspecified: Secondary | ICD-10-CM

## 2014-06-23 DIAGNOSIS — I255 Ischemic cardiomyopathy: Secondary | ICD-10-CM

## 2014-06-23 DIAGNOSIS — I25119 Atherosclerotic heart disease of native coronary artery with unspecified angina pectoris: Secondary | ICD-10-CM

## 2014-06-23 DIAGNOSIS — R931 Abnormal findings on diagnostic imaging of heart and coronary circulation: Secondary | ICD-10-CM

## 2014-06-23 LAB — CBC
HCT: 42.8 % (ref 36.0–46.0)
HEMOGLOBIN: 13.8 g/dL (ref 12.0–15.0)
MCH: 24.3 pg — AB (ref 26.0–34.0)
MCHC: 32.2 g/dL (ref 30.0–36.0)
MCV: 75.5 fL — ABNORMAL LOW (ref 78.0–100.0)
MPV: 9.9 fL (ref 8.6–12.4)
Platelets: 330 10*3/uL (ref 150–400)
RBC: 5.67 MIL/uL — ABNORMAL HIGH (ref 3.87–5.11)
RDW: 16.1 % — AB (ref 11.5–15.5)
WBC: 6.2 10*3/uL (ref 4.0–10.5)

## 2014-06-23 MED ORDER — CARVEDILOL 12.5 MG PO TABS: 12.5000 mg | ORAL_TABLET | Freq: Two times a day (BID) | ORAL | Status: DC

## 2014-06-23 NOTE — Patient Instructions (Signed)
Your physician has requested that you have a cardiac catheterization. Cardiac catheterization is used to diagnose and/or treat various heart conditions. Doctors may recommend this procedure for a number of different reasons. The most common reason is to evaluate chest pain. Chest pain can be a symptom of coronary artery disease (CAD), and cardiac catheterization can show whether plaque is narrowing or blocking your heart's arteries. This procedure is also used to evaluate the valves, as well as measure the blood flow and oxygen levels in different parts of your heart. For further information please visit [email or web address]. Please follow instruction sheet, as given. Dr.Croitoru will do this next Tuesday (March 1st) afternoon.    INCREASE your Carvedilol to 12.5mg  twice daily  Your physician recommends that you have lab work done today for your procedure.

## 2014-06-24 ENCOUNTER — Encounter: Payer: Self-pay | Admitting: Cardiovascular Disease

## 2014-06-24 ENCOUNTER — Other Ambulatory Visit: Payer: Self-pay | Admitting: *Deleted

## 2014-06-24 ENCOUNTER — Telehealth: Payer: Self-pay | Admitting: Cardiovascular Disease

## 2014-06-24 DIAGNOSIS — R931 Abnormal findings on diagnostic imaging of heart and coronary circulation: Secondary | ICD-10-CM | POA: Insufficient documentation

## 2014-06-24 DIAGNOSIS — I255 Ischemic cardiomyopathy: Secondary | ICD-10-CM

## 2014-06-24 DIAGNOSIS — I2510 Atherosclerotic heart disease of native coronary artery without angina pectoris: Secondary | ICD-10-CM

## 2014-06-24 DIAGNOSIS — R9431 Abnormal electrocardiogram [ECG] [EKG]: Secondary | ICD-10-CM

## 2014-06-24 HISTORY — DX: Ischemic cardiomyopathy: I25.5

## 2014-06-24 LAB — COMPREHENSIVE METABOLIC PANEL
ALT: 10 U/L (ref 0–35)
AST: 12 U/L (ref 0–37)
Albumin: 2.9 g/dL — ABNORMAL LOW (ref 3.5–5.2)
Alkaline Phosphatase: 100 U/L (ref 39–117)
BILIRUBIN TOTAL: 0.5 mg/dL (ref 0.2–1.2)
BUN: 55 mg/dL — AB (ref 6–23)
CO2: 22 meq/L (ref 19–32)
CREATININE: 13.39 mg/dL — AB (ref 0.50–1.10)
Calcium: 7.7 mg/dL — ABNORMAL LOW (ref 8.4–10.5)
Chloride: 94 mEq/L — ABNORMAL LOW (ref 96–112)
Glucose, Bld: 119 mg/dL — ABNORMAL HIGH (ref 70–99)
Potassium: 3.9 mEq/L (ref 3.5–5.3)
Sodium: 136 mEq/L (ref 135–145)
Total Protein: 6.2 g/dL (ref 6.0–8.3)

## 2014-06-24 LAB — PROTIME-INR
INR: 1.1 (ref <1.50)
PROTHROMBIN TIME: 14.2 s (ref 11.6–15.2)

## 2014-06-24 LAB — APTT: APTT: 36 s (ref 24–37)

## 2014-06-24 NOTE — Telephone Encounter (Signed)
ESRD on on peritoneal dialysis. Expected finding

## 2014-06-24 NOTE — Telephone Encounter (Signed)
Received a call from [place] with [facility] lab calling to report 06/23/14 creat 13.39.Message sent to Dr.Croitoru.

## 2014-06-24 NOTE — Progress Notes (Signed)
Patient ID: [name], female   DOB: 24-Mar-1971, 44 y.o.   MRN: [id number]     Reason for office visit Abnormal nuclear stress test/depressed left ventricular systolic function  Mrs. Beckey Downing is referred in consultation by Dr. Pearson Grippe for newly diagnosed cardiomyopathy. She has a complex medical history.  She has had type 1 diabetes mellitus since around age 28. She developed end-stage renal disease. An AV fistula was placed in her left upper extremity but never really worked. 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 Unfortunately both the graft failed and since June 2014 in back on dialysis. A peritoneal dialysis catheter was implanted in May 2015 and since then she has been on peritoneal dialysis performed at home. She underwent another pretransplant workup at [facility] and her stress test showed evidence of an inferior wall scar but without ischemia. For this reason she was felt not to be a candidate for further transplantation. At that time the ejection fraction was around 50%. She has never undergone coronary angiography.  Around Christmas 2015 she had chest discomfort and shortness of breath which were felt to be probably related to pericarditis and volume overload. Her shortness of breath improved with more aggressive fluid removal via dialysis. She does describe having a sensation of a "train standing on my chest" for several days. Her cardiogram performed on January 8 shows that left ventricular systolic function had dropped to 35-40 percent. There was hypokinesis of the septal and inferior walls. Her symptoms gradually improved and she has had no complaints of chest discomfort since then.   Her electrocardiogram today shows Q waves in the septal leads and inferior leads as well as prominent anterior T-wave inversion. These findings are new when compared with the preoperative  ECG performed in May 2015.  Diabetes control has been challenging and her hemoglobin A1c is 9%. She has occasional episodes of hypoglycemia. She is on a very low dose of statin and I do not have her most recent lipid profile.  There are couple of confusing findings on her medicine list. She reports that she is not taking amlodipine and that her carvedilol is indeed prescribed twice daily not once daily.   Allergies  Allergen Reactions  . Pork-Derived Products     Pt is [place]lum    Current Outpatient Prescriptions  Medication Sig Dispense Refill  . acetaminophen (TYLENOL) 325 MG tablet Take 650 mg by mouth every 4 (four) hours as needed for mild pain.    Marland Kitchen aspirin 325 MG tablet Take 325 mg by mouth daily.     Marland Kitchen gabapentin (NEURONTIN) 100 MG capsule Take 100 mg by mouth daily.    . insulin glargine (LANTUS) 100 UNIT/ML injection Inject 10 Units into the skin at bedtime.     . insulin lispro (HUMALOG) 100 UNIT/ML injection Inject 100 Units into the skin 3 (three) times daily before meals.    Marland Kitchen losartan (COZAAR) 100 MG tablet Take 100 mg by mouth daily.    . multivitamin (RENA-VIT) TABS tablet Take 1 tablet by mouth daily.    . simvastatin (ZOCOR) 5 MG tablet Take 5 mg by mouth at bedtime.     Marland Kitchen amLODipine (NORVASC) 5 MG tablet Take 5 mg by mouth daily.     . carvedilol (COREG) 12.5 MG tablet Take 1 tablet (12.5 mg total) by mouth 2 (two) times daily. 180 tablet 3  . omeprazole (PRILOSEC) 20  MG capsule Take 20 mg by mouth daily as needed.      No current facility-administered medications for this visit.    Past Medical History  Diagnosis Date  . H/O kidney transplant   . Pancreas transplanted   . Diabetes mellitus without complication   . Hypertension   . Renal disorder   . Anemia   . GERD (gastroesophageal reflux disease)   . End stage renal disease   . Hyperlipidemia   . Cardiomyopathy, ischemic 06/24/2014    Past Surgical History  Procedure Laterality Date  . Combined  kidney-pancreas transplant  05/2010    [facility]  Pancreas left mid abdomen  . Cesarean section    . Laparoscopic insertion peritoneal catheter  07/18/2008    Dr Excell Seltzer  . Laparoscopic repositioning capd catheter  2020-07-208    Dr Excell Seltzer  . Laparoscopic repositioning capd catheter  11/08/2008    Dr Excell Seltzer  . Omentectomy  11/08/2008    Dr Excell Seltzer  . Laparoscopic repositioning capd catheter  10/03/2009    Unplugging CAPD catheter Dr Excell Seltzer  . Insertion of dialysis catheter  September 28, 2012    Right upper chest  . Capd insertion N/A 09/08/2013    Procedure: Laparoscopic CAPD peritoneal dialysis catheter placement, possible lysis of adhesions   ;  Surgeon: Adin Hector, MD;  Location: [place] [facility];  Service: General;  Laterality: N/A;    Family History  Problem Relation Age of Onset  . CAD Mother   . Stroke Mother   . COPD Father     History   Social History  . Marital Status: Married    Spouse Name: N/A  . Number of Children: N/A  . Years of Education: N/A   Occupational History  . Not on file.   Social History Main Topics  . Smoking status: Never Smoker   . Smokeless tobacco: Never Used  . Alcohol Use: No  . Drug Use: No  . Sexual Activity: Yes    Birth Control/ Protection: None   Other Topics Concern  . Not on file   Social History Narrative    Review of systems: The patient specifically denies any chest pain at rest or with exertion, dyspnea at rest or with exertion, orthopnea, paroxysmal nocturnal dyspnea, syncope, palpitations, focal neurological deficits, intermittent claudication, lower extremity edema, unexplained weight gain, cough, hemoptysis or wheezing.  The patient also denies abdominal pain, nausea, vomiting, dysphagia, diarrhea, constipation, polyuria, polydipsia, dysuria, hematuria, frequency, urgency, abnormal bleeding or bruising, fever, chills, unexpected weight changes, mood swings, change in skin or hair texture, change in voice  quality, auditory or visual problems, allergic reactions or rashes, new musculoskeletal complaints other than usual "aches and pains".   PHYSICAL EXAM BP 122/70 mmHg  Pulse 82  Resp 16  Ht 5' (1.524 m)  Wt 123 lb 9.6 oz (56.065 kg)  BMI 24.14 kg/m2  General: Alert, oriented x3, no distress Head: no evidence of trauma, PERRL, EOMI, no exophtalmos or lid lag, no myxedema, no xanthelasma; normal ears, nose and oropharynx Neck: No melena elevated jugular venous pulsations very prominent V waves; brisk carotid pulses without delay and no carotid bruits Chest: clear to auscultation, no signs of consolidation by percussion or palpation, normal fremitus, symmetrical and full respiratory excursions Cardiovascular: normal position and quality of the apical impulse, regular rhythm, normal first and widely split second heart sounds. Multiple additional abnormal sounds are heard. I think there is a fourth heart sound. There is definitely a holosystolic murmur  at the left lower sternal border consistent with tricuspid insufficiency. There is also a probable accompanying diastolic rumble suggesting that the tricuspid insufficiency is severe. In addition there is a scratchy high-pitched complex of sounds that cover both systole and diastole suggestive of a pericardial rub. Abdomen: no tenderness or distention, no masses by palpation, no abnormal pulsatility or arterial bruits, normal bowel sounds, no hepatosplenomegaly Extremities: no clubbing, cyanosis or edema; 2+ radial, ulnar and brachial pulses bilaterally; 2+ right femoral, posterior tibial and dorsalis pedis pulses; 2+ left femoral, posterior tibial and dorsalis pedis pulses; no subclavian or femoral bruits Neurological: grossly nonfocal   EKG: Sinus rhythm, Q waves in leads V1 and V2 and V3, possible inferior Q waves, right bundle branch block, extreme left axis deviation, prominent T-wave inversion across the entire anterior precordium as well as leads  1 and aVL suggestive of ischemia  Lipid Panel  No results found for: CHOL, TRIG, HDL, CHOLHDL, VLDL, LDLCALC, LDLDIRECT  BMET    Component Value Date/Time   NA 136 06/23/2014 1538   K 3.9 06/23/2014 1538   CL 94* 06/23/2014 1538   CO2 22 06/23/2014 1538   GLUCOSE 119* 06/23/2014 1538   BUN 55* 06/23/2014 1538   CREATININE 13.39* 06/23/2014 1538   CREATININE 6.19* 09/01/2013 0834   CALCIUM 7.7* 06/23/2014 1538   CALCIUM 7.3* 07/13/2008 0933   GFRNONAA 8* 09/01/2013 0834   GFRAA 9* 09/01/2013 0834     ASSESSMENT AND PLAN  It appears that 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 She has a nuclear and echo findings suggestive of an old inferior wall myocardial infarction dating back at least to the stress test that she had at [facility] in 2014. She also now has Q waves in the anterior leads and a marked decrease in left ventricular systolic function suggesting that she also has LAD artery involvement.   Possible that the episode of acute pericarditis that occurred a few weeks ago was actually a postinfarction event rather than related to uremia. Interestingly, she still has what seems to be a pericardial rub today.  She has moderate probably ischemic cardiomyopathy. She does not have evidence of heart failure decompensation, likely since volume adjustments via peritoneal dialysis keep her well compensated.  She appears to be euvolemic today. The prominent jugular venous V waves are probably related to severe tricuspid insufficiency. She is already on a full dose of angiotensin receptor blocker and we will try to titrate up her dose of beta blocker. She is taking aspirin.  I have recommended that she undergo coronary angiography. She might need coronary artery bypass surgery. It is less likely that her anatomy would lead to recommendation for percutaneous revascularization (diabetic patient with low left ventricular systolic function do better with surgical  revascularization.  The presence of end-stage renal disease and rather brittle hard to control diabetes add to the complexity of her case. Despite all these medical problems she is a remarkably resilient individual and is still working full time.  The technical details of cardiac catheterization and the risks and benefits of diagnostic and possible interventional procedure were discussed with her in detail she agrees to proceed. From what Mr. Beckey Downing tells me she did not have AV fistulas in her upper extremities to the poor quality of her veins. We might be able to do her procedure from the radial approach in that case.  We'll need an update on her lipid profile.   Orders Placed This Encounter  Procedures  . INR/PT  .  PTT  . CBC  . Comp Met (CMET)  . EKG 12-Lead  . LEFT HEART CATHETERIZATION WITH CORONARY ANGIOGRAM   Meds ordered this encounter  Medications  . carvedilol (COREG) 12.5 MG tablet    Sig: Take 1 tablet (12.5 mg total) by mouth 2 (two) times daily.    Dispense:  180 tablet    Refill:  Hilo Tammye Kahler, MD, [facility] [phone] office [phone] pager

## 2014-06-27 ENCOUNTER — Encounter (HOSPITAL_COMMUNITY): Payer: Self-pay | Admitting: Pharmacy Technician

## 2014-06-28 ENCOUNTER — Ambulatory Visit (HOSPITAL_COMMUNITY)
Admission: RE | Admit: 2014-06-28 | Discharge: 2014-06-28 | Disposition: A | Discharge status: Home / Self Care | Payer: PRIVATE HEALTH INSURANCE | Source: Ambulatory Visit | Attending: Cardiovascular Disease | Admitting: Cardiovascular Disease

## 2014-06-28 ENCOUNTER — Encounter (HOSPITAL_COMMUNITY): Payer: Self-pay | Admitting: Cardiovascular Disease

## 2014-06-28 ENCOUNTER — Encounter (HOSPITAL_COMMUNITY)
Admission: RE | Disposition: A | Discharge status: Home / Self Care | Payer: Self-pay | Source: Ambulatory Visit | Attending: Cardiovascular Disease

## 2014-06-28 DIAGNOSIS — E785 Hyperlipidemia, unspecified: Secondary | ICD-10-CM | POA: Diagnosis not present

## 2014-06-28 DIAGNOSIS — Z940 Kidney transplant status: Secondary | ICD-10-CM | POA: Diagnosis not present

## 2014-06-28 DIAGNOSIS — K219 Gastro-esophageal reflux disease without esophagitis: Secondary | ICD-10-CM | POA: Insufficient documentation

## 2014-06-28 DIAGNOSIS — I255 Ischemic cardiomyopathy: Secondary | ICD-10-CM | POA: Diagnosis not present

## 2014-06-28 DIAGNOSIS — N186 End stage renal disease: Secondary | ICD-10-CM | POA: Diagnosis not present

## 2014-06-28 DIAGNOSIS — E108 Type 1 diabetes mellitus with unspecified complications: Secondary | ICD-10-CM | POA: Insufficient documentation

## 2014-06-28 DIAGNOSIS — Z79899 Other long term (current) drug therapy: Secondary | ICD-10-CM | POA: Insufficient documentation

## 2014-06-28 DIAGNOSIS — Z9483 Pancreas transplant status: Secondary | ICD-10-CM | POA: Insufficient documentation

## 2014-06-28 DIAGNOSIS — I509 Heart failure, unspecified: Secondary | ICD-10-CM | POA: Insufficient documentation

## 2014-06-28 DIAGNOSIS — Z7982 Long term (current) use of aspirin: Secondary | ICD-10-CM | POA: Insufficient documentation

## 2014-06-28 DIAGNOSIS — Z794 Long term (current) use of insulin: Secondary | ICD-10-CM | POA: Insufficient documentation

## 2014-06-28 DIAGNOSIS — R9431 Abnormal electrocardiogram [ECG] [EKG]: Secondary | ICD-10-CM | POA: Insufficient documentation

## 2014-06-28 DIAGNOSIS — I2510 Atherosclerotic heart disease of native coronary artery without angina pectoris: Secondary | ICD-10-CM | POA: Insufficient documentation

## 2014-06-28 DIAGNOSIS — I120 Hypertensive chronic kidney disease with stage 5 chronic kidney disease or end stage renal disease: Secondary | ICD-10-CM | POA: Diagnosis not present

## 2014-06-28 DIAGNOSIS — I340 Nonrheumatic mitral (valve) insufficiency: Secondary | ICD-10-CM | POA: Diagnosis not present

## 2014-06-28 DIAGNOSIS — I252 Old myocardial infarction: Secondary | ICD-10-CM | POA: Insufficient documentation

## 2014-06-28 DIAGNOSIS — Z992 Dependence on renal dialysis: Secondary | ICD-10-CM | POA: Insufficient documentation

## 2014-06-28 DIAGNOSIS — Z8249 Family history of ischemic heart disease and other diseases of the circulatory system: Secondary | ICD-10-CM | POA: Diagnosis not present

## 2014-06-28 HISTORY — PX: LEFT HEART CATHETERIZATION WITH CORONARY ANGIOGRAM: SHX5451

## 2014-06-28 LAB — GLUCOSE, CAPILLARY
GLUCOSE-CAPILLARY: 57 mg/dL — AB (ref 70–99)
Glucose-Capillary: 86 mg/dL (ref 70–99)
Glucose-Capillary: 126 mg/dL — ABNORMAL HIGH (ref 70–99)
Glucose-Capillary: 65 mg/dL — ABNORMAL LOW (ref 70–99)
Glucose-Capillary: 79 mg/dL (ref 70–99)

## 2014-06-28 SURGERY — LEFT HEART CATHETERIZATION WITH CORONARY ANGIOGRAM
Anesthesia: LOCAL

## 2014-06-28 MED ORDER — MIDAZOLAM HCL 2 MG/2ML IJ SOLN
INTRAMUSCULAR | Status: AC
  Filled 2014-06-28: qty 2

## 2014-06-28 MED ORDER — ONDANSETRON HCL 4 MG/2ML IJ SOLN: 4.0000 mg | Freq: Four times a day (QID) | INTRAMUSCULAR | Status: DC | PRN

## 2014-06-28 MED ORDER — HEPARIN (PORCINE) IN NACL 2-0.9 UNIT/ML-% IJ SOLN
INTRAMUSCULAR | Status: AC
  Filled 2014-06-28: qty 1000

## 2014-06-28 MED ORDER — HEPARIN SODIUM (PORCINE) 1000 UNIT/ML IJ SOLN
INTRAMUSCULAR | Status: DC
Start: 2014-06-28 — End: 2014-06-28
  Filled 2014-06-28: qty 1

## 2014-06-28 MED ORDER — DEXTROSE 50 % IV SOLN
25.0000 mL | Freq: Once | INTRAVENOUS | Status: AC
  Administered 2014-06-28: 25 mL via INTRAVENOUS

## 2014-06-28 MED ORDER — SODIUM CHLORIDE 0.9 % IJ SOLN: 3.0000 mL | INTRAMUSCULAR | Status: DC | PRN

## 2014-06-28 MED ORDER — SODIUM CHLORIDE 0.9 % IV SOLN: INTRAVENOUS | Status: DC

## 2014-06-28 MED ORDER — SODIUM CHLORIDE 0.9 % IJ SOLN: 3.0000 mL | Freq: Two times a day (BID) | INTRAMUSCULAR | Status: DC

## 2014-06-28 MED ORDER — LIDOCAINE HCL (PF) 1 % IJ SOLN
INTRAMUSCULAR | Status: AC
  Filled 2014-06-28: qty 30

## 2014-06-28 MED ORDER — FENTANYL CITRATE 0.05 MG/ML IJ SOLN
INTRAMUSCULAR | Status: AC
  Filled 2014-06-28: qty 2

## 2014-06-28 MED ORDER — ACETAMINOPHEN 325 MG PO TABS: 650.0000 mg | ORAL_TABLET | ORAL | Status: DC | PRN

## 2014-06-28 MED ORDER — VERAPAMIL HCL 2.5 MG/ML IV SOLN
INTRAVENOUS | Status: AC
  Filled 2014-06-28: qty 2

## 2014-06-28 MED ORDER — DEXTROSE 50 % IV SOLN
INTRAVENOUS | Status: DC
Start: 2014-06-28 — End: 2014-06-29
  Filled 2014-06-28: qty 50

## 2014-06-28 MED ORDER — SODIUM CHLORIDE 0.9 % IV SOLN: 250.0000 mL | INTRAVENOUS | Status: DC | PRN

## 2014-06-28 NOTE — Progress Notes (Signed)
30fr sheath aspirated and removed from rfa, manual pressure applied for 20 minutes. Groin level 0 no S+S of hematoma. Bedrest instructions given. Bilateral dp pulses present and palpable.  Bedrest beings at 17:00

## 2014-06-28 NOTE — H&P (View-Only) (Signed)
Patient ID: Priscilla [name], female   DOB: Jul 20, 1970, 44 y.o.   MRN: [id number]     Reason for office visit Abnormal nuclear stress test/depressed left ventricular systolic function  Priscilla [name] is referred in consultation by Dr. Pearson Grippe for newly diagnosed cardiomyopathy. She has a complex medical history.  She has had type 1 diabetes mellitus since around age 52. She developed end-stage renal disease. An AV fistula was placed in her left upper extremity but never really worked. 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 Unfortunately both the graft failed and since June 2014 in back on dialysis. A peritoneal dialysis catheter was implanted in May 2015 and since then she has been on peritoneal dialysis performed at home. She underwent another pretransplant workup at [facility] and her stress test showed evidence of an inferior wall scar but without ischemia. For this reason she was felt not to be a candidate for further transplantation. At that time the ejection fraction was around 50%. She has never undergone coronary angiography.  Around Christmas 2015 she had chest discomfort and shortness of breath which were felt to be probably related to pericarditis and volume overload. Her shortness of breath improved with more aggressive fluid removal via dialysis. She does describe having a sensation of a "train standing on my chest" for several days. Her cardiogram performed on January 8 shows that left ventricular systolic function had dropped to 35-40 percent. There was hypokinesis of the septal and inferior walls. Her symptoms gradually improved and she has had no complaints of chest discomfort since then.   Her electrocardiogram today shows Q waves in the septal leads and inferior leads as well as prominent anterior T-wave inversion. These findings are new when compared with the preoperative  ECG performed in May 2015.  Diabetes control has been challenging and her hemoglobin A1c is 9%. She has occasional episodes of hypoglycemia. She is on a very low dose of statin and I do not have her most recent lipid profile.  There are couple of confusing findings on her medicine list. She reports that she is not taking amlodipine and that her carvedilol is indeed prescribed twice daily not once daily.   Allergies  Allergen Reactions  . Pork-Derived Products     Pt is [place]    Current Outpatient Prescriptions  Medication Sig Dispense Refill  . acetaminophen (TYLENOL) 325 MG tablet Take 650 mg by mouth every 4 (four) hours as needed for mild pain.    Marland Kitchen aspirin 325 MG tablet Take 325 mg by mouth daily.     Marland Kitchen gabapentin (NEURONTIN) 100 MG capsule Take 100 mg by mouth daily.    . insulin glargine (LANTUS) 100 UNIT/ML injection Inject 10 Units into the skin at bedtime.     . insulin lispro (HUMALOG) 100 UNIT/ML injection Inject 100 Units into the skin 3 (three) times daily before meals.    Marland Kitchen losartan (COZAAR) 100 MG tablet Take 100 mg by mouth daily.    . multivitamin (RENA-VIT) TABS tablet Take 1 tablet by mouth daily.    . simvastatin (ZOCOR) 5 MG tablet Take 5 mg by mouth at bedtime.     Marland Kitchen amLODipine (NORVASC) 5 MG tablet Take 5 mg by mouth daily.     . carvedilol (COREG) 12.5 MG tablet Take 1 tablet (12.5 mg total) by mouth 2 (two) times daily. 180 tablet 3  . omeprazole (PRILOSEC) 20  MG capsule Take 20 mg by mouth daily as needed.      No current facility-administered medications for this visit.    Past Medical History  Diagnosis Date  . H/O kidney transplant   . Pancreas transplanted   . Diabetes mellitus without complication   . Hypertension   . Renal disorder   . Anemia   . GERD (gastroesophageal reflux disease)   . End stage renal disease   . Hyperlipidemia   . Cardiomyopathy, ischemic 06/24/2014    Past Surgical History  Procedure Laterality Date  . Combined  kidney-pancreas transplant  05/2010    [facility]..  Pancreas left mid abdomen  . Cesarean section    . Laparoscopic insertion peritoneal catheter  07/18/2008    Dr Excell Seltzer  . Laparoscopic repositioning capd catheter  May 15, 202010    Dr Excell Seltzer  . Laparoscopic repositioning capd catheter  11/08/2008    Dr Excell Seltzer  . Omentectomy  11/08/2008    Dr Excell Seltzer  . Laparoscopic repositioning capd catheter  10/03/2009    Unplugging CAPD catheter Dr Excell Seltzer  . Insertion of dialysis catheter  September 28, 2012    Right upper chest  . Capd insertion N/A 09/08/2013    Procedure: Laparoscopic CAPD peritoneal dialysis catheter placement, possible lysis of adhesions   ;  Surgeon: Adin Hector, MD;  Location: [place] [facility];  Service: General;  Laterality: N/A;    Family History  Problem Relation Age of Onset  . CAD Mother   . Stroke Mother   . COPD Father     History   Social History  . Marital Status: Married    Spouse Name: N/A  . Number of Children: N/A  . Years of Education: N/A   Occupational History  . Not on file.   Social History Main Topics  . Smoking status: Never Smoker   . Smokeless tobacco: Never Used  . Alcohol Use: No  . Drug Use: No  . Sexual Activity: Yes    Birth Control/ Protection: None   Other Topics Concern  . Not on file   Social History Narrative    Review of systems: The patient specifically denies any chest pain at rest or with exertion, dyspnea at rest or with exertion, orthopnea, paroxysmal nocturnal dyspnea, syncope, palpitations, focal neurological deficits, intermittent claudication, lower extremity edema, unexplained weight gain, cough, hemoptysis or wheezing.  The patient also denies abdominal pain, nausea, vomiting, dysphagia, diarrhea, constipation, polyuria, polydipsia, dysuria, hematuria, frequency, urgency, abnormal bleeding or bruising, fever, chills, unexpected weight changes, mood swings, change in skin or hair texture, change in voice  quality, auditory or visual problems, allergic reactions or rashes, new musculoskeletal complaints other than usual "aches and pains".   PHYSICAL EXAM BP 122/70 mmHg  Pulse 82  Resp 16  Ht 5' (1.524 m)  Wt 123 lb 9.6 oz (56.065 kg)  BMI 24.14 kg/m2  General: Alert, oriented x3, no distress Head: no evidence of trauma, PERRL, EOMI, no exophtalmos or lid lag, no myxedema, no xanthelasma; normal ears, nose and oropharynx Neck: No melena elevated jugular venous pulsations very prominent V waves; brisk carotid pulses without delay and no carotid bruits Chest: clear to auscultation, no signs of consolidation by percussion or palpation, normal fremitus, symmetrical and full respiratory excursions Cardiovascular: normal position and quality of the apical impulse, regular rhythm, normal first and widely split second heart sounds. Multiple additional abnormal sounds are heard. I think there is a fourth heart sound. There is definitely a holosystolic murmur  at the left lower sternal border consistent with tricuspid insufficiency. There is also a probable accompanying diastolic rumble suggesting that the tricuspid insufficiency is severe. In addition there is a scratchy high-pitched complex of sounds that cover both systole and diastole suggestive of a pericardial rub. Abdomen: no tenderness or distention, no masses by palpation, no abnormal pulsatility or arterial bruits, normal bowel sounds, no hepatosplenomegaly Extremities: no clubbing, cyanosis or edema; 2+ radial, ulnar and brachial pulses bilaterally; 2+ right femoral, posterior tibial and dorsalis pedis pulses; 2+ left femoral, posterior tibial and dorsalis pedis pulses; no subclavian or femoral bruits Neurological: grossly nonfocal   EKG: Sinus rhythm, Q waves in leads V1 and V2 and V3, possible inferior Q waves, right bundle branch block, extreme left axis deviation, prominent T-wave inversion across the entire anterior precordium as well as leads  1 and aVL suggestive of ischemia  Lipid Panel  No results found for: CHOL, TRIG, HDL, CHOLHDL, VLDL, LDLCALC, LDLDIRECT  BMET    Component Value Date/Time   NA 136 06/23/2014 1538   K 3.9 06/23/2014 1538   CL 94* 06/23/2014 1538   CO2 22 06/23/2014 1538   GLUCOSE 119* 06/23/2014 1538   BUN 55* 06/23/2014 1538   CREATININE 13.39* 06/23/2014 1538   CREATININE 6.19* 09/01/2013 0834   CALCIUM 7.7* 06/23/2014 1538   CALCIUM 7.3* 07/13/2008 0933   GFRNONAA 8* 09/01/2013 0834   GFRAA 9* 09/01/2013 0834     ASSESSMENT AND PLAN  It appears that 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 She has a nuclear and echo findings suggestive of an old inferior wall myocardial infarction dating back at least to the stress test that she had at [facility] in 2014. She also now has Q waves in the anterior leads and a marked decrease in left ventricular systolic function suggesting that she also has LAD artery involvement.   Possible that the episode of acute pericarditis that occurred a few weeks ago was actually a postinfarction event rather than related to uremia. Interestingly, she still has what seems to be a pericardial rub today.  She has moderate probably ischemic cardiomyopathy. She does not have evidence of heart failure decompensation, likely since volume adjustments via peritoneal dialysis keep her well compensated.  She appears to be euvolemic today. The prominent jugular venous V waves are probably related to severe tricuspid insufficiency. She is already on a full dose of angiotensin receptor blocker and we will try to titrate up her dose of beta blocker. She is taking aspirin.  I have recommended that she undergo coronary angiography. She might need coronary artery bypass surgery. It is less likely that her anatomy would lead to recommendation for percutaneous revascularization (diabetic patient with low left ventricular systolic function do better with surgical  revascularization.  The presence of end-stage renal disease and rather brittle hard to control diabetes add to the complexity of her case. Despite all these medical problems she is a remarkably resilient individual and is still working full time.  The technical details of cardiac catheterization and the risks and benefits of diagnostic and possible interventional procedure were discussed with her in detail she agrees to proceed. From what Mr. Beckey [name] tells me she did not have AV fistulas in her upper extremities to the poor quality of her veins. We might be able to do her procedure from the radial approach in that case.  We'll need an update on her lipid profile.   Orders Placed This Encounter  Procedures  . INR/PT  .  PTT  . CBC  . Comp Met (CMET)  . EKG 12-Lead  . LEFT HEART CATHETERIZATION WITH CORONARY ANGIOGRAM   Meds ordered this encounter  Medications  . carvedilol (COREG) 12.5 MG tablet    Sig: Take 1 tablet (12.5 mg total) by mouth 2 (two) times daily.    Dispense:  180 tablet    Refill:  [place] Kellar Westberg, MD, [facility] [phone] office [phone] pager

## 2014-06-28 NOTE — Progress Notes (Signed)
Hypoglycemic Event  CBG: 65 at 1358  Treatment: D50 IV 25 mL  Symptoms: Shaky  Follow-up CBG: Time:1427 CBG Result:126  Possible Reasons for Event: Other: Patient has been NPO for procedure  Comments/MD notified: Cath lab made aware of situation     Priscilla [name], [employer]  Remember to initiate Hypoglycemia Order Set & complete

## 2014-06-28 NOTE — Interval H&P Note (Signed)
History and Physical Interval Note:  06/28/2014 1:14 PM  Priscilla [name]  has presented today for surgery, with the diagnosis of cad  The various methods of treatment have been discussed with the patient and family. After consideration of risks, benefits and other options for treatment, the patient has consented to  Procedure(s): LEFT HEART CATHETERIZATION WITH CORONARY ANGIOGRAM (N/A) as a surgical intervention .  The patient's history has been reviewed, patient examined, no change in status, stable for surgery.  I have reviewed the patient's chart and labs.  Questions were answered to the patient's satisfaction.     [name]

## 2014-06-28 NOTE — Progress Notes (Signed)
Called and spoke with Dr. Sallyanne Kuster about patient still menstrating to inquire if we needed to get a serum pregnancy test. Patient states that she is not sexually active and has an IUD and denies pregnancy.  Dr. Orene Desanctis stated that we did not need to get a serum pregnancy test.

## 2014-06-28 NOTE — Discharge Instructions (Signed)

## 2014-06-28 NOTE — Op Note (Signed)
CARDIAC CATHETERIZATION REPORT   Procedures performed:  1. Left heart catheterization  2. Selective coronary angiography  3. Left ventriculography   Reason for procedure:  Congestive heart failure Abnormal nuclear stress test/stress echo Valvular heart disease  Procedure performed by: [name], MD, [facility]  Complications: none   Estimated blood loss: less than 5 mL   History:  [name] is referred in consultation by Dr. Pearson Grippe for newly diagnosed cardiomyopathy. She has a complex medical history.  She has had type 1 diabetes mellitus since around age 44. She developed end-stage renal disease. An AV fistula was placed in her left upper extremity but never really worked. 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 Unfortunately both the graft failed and since June 2014 in back on dialysis. A peritoneal dialysis catheter was implanted in May 2015 and since then she has been on peritoneal dialysis performed at home. She underwent another pretransplant workup at [facility] and her stress test showed evidence of an inferior wall scar but without ischemia. For this reason she was felt not to be a candidate for further transplantation. At that time the ejection fraction was around 50%. She has never undergone coronary angiography.  Around Christmas 2015 she had chest discomfort and shortness of breath which were felt to be probably related to pericarditis and volume overload. Her shortness of breath improved with more aggressive fluid removal via dialysis. She does describe having a sensation of a "train standing on my chest" for several days. Her cardiogram performed on January 8 shows that left ventricular systolic function had dropped to 35-40 percent. There was hypokinesis of the septal and inferior walls. Her symptoms gradually improved and she has had no complaints of chest discomfort  since then.   Her electrocardiogram today shows Q waves in the septal leads and inferior leads as well as prominent anterior T-wave inversion. These findings are new when compared with the preoperative ECG performed in May 2015.  Consent: The risks, benefits, and details of the procedure were explained to the patient. Risks including death, MI, stroke, bleeding, limb ischemia, renal failure and allergy were described and accepted by the patient. Informed written consent was obtained prior to proceeding.  Technique: The patient was brought to the cardiac catheterization laboratory in the fasting state. She was prepped and draped in the usual sterile fashion. Radial artery access was unsuccessful. Local anesthesia with 1% lidocaine was administered to the right groin area. Using the modified Seldinger technique a 5 French right common femoral artery sheath was introduced without difficulty. Under fluoroscopic guidance, using 5 [place] JL4, JR and angled pigtail catheters, selective cannulation of the left coronary artery, right coronary artery and left ventricle were respectively performed. Several coronary angiograms in a variety of projections were recorded, as well as a left ventriculogram in the RAO projection. Left ventricular pressure and a pull back to the aorta were recorded. No immediate complications occurred. At the end of the procedure, all catheters were removed. After the procedure, hemostasis will be achieved with manual pressure.  Contrast used: 30 mL Omnipaque  Angiographic Findings:  1. The left main coronary artery appears calcified and has very mild ostial disease, but is free of significant stenoses and bifurcates in the usual fashion into the left anterior descending artery and left circumflex coronary artery.  2. The left anterior descending artery is a large-territory, but small caliber vessel that reaches the  apex and generates three major diagonal branches. There is evidence of  extensive and diffuse luminal irregularities and moderate calcification. The most important stenosis is a 95% proximal lesion. There is a 50-60% mid vessel stenosis and a 70-80% distal stebnosis. 3. The left circumflex coronary artery is a medium-size vessel non dominant vessel that generates two major oblique marginal arteries. There is evidence of extensive luminal irregularities and moderate calcification. Beyond the first OM (which is the larger branch) there is a 90% stenosis. The second OM is diffusely diseased. 4. The right coronary artery is a small-size dominant vessel that is diffusely diseased, calcified and subtotally occluded before it generates a posterior lateral ventricular system as well as the PDA. These can be seen filling via bridging collaterals and RV marginal collaterals. 5. The left ventricle is normal in size. The left ventricle systolic function is moderately-to-severely decreased with an estimated ejection fraction of 30-35%. Regional wall motion abnormalities are seen. The Mid-basal inferior wall is akinetic. The mid anterior wall is hypokinetic. The apex appears mildly dyskinetic. No left ventricular thrombus is seen. There is moderate to severe mitral insufficiency. The ascending aorta appears normal. There is no aortic valve stenosis by pullback. The left ventricular end-diastolic pressure is 23 mm Hg.    IMPRESSIONS:   Severe multivessel CAD with severe ischemic cardiomyopathy and moderate to severe secondary mitral insufficiency. Small caliber vessels with diffuse "diabetic" disease. Questionable surgical targets   RECOMMENDATION:   Consider surgical revascularization with LIMA to LAD and mitral valve repair. Not sure if the other stenotic vessels are graftable. May need to consider viability study (24 hour Thallium rest redistribution) to assess the anterior wall. She is already on near maximal medical therapy. CHF is well compensated on peritoneal dialysis. If surgery  is not an option, consider PCI of proximal LAD artery.    [name], MD, [facility] [phone] office [phone] pager

## 2014-06-28 NOTE — Progress Notes (Signed)
Called and spoke with Reino Bellis, RN to inform her about patient being given 34mL of IV dextrose for CBG of 65 at 1405.  Patient's CBG rechecked at 1427 at was 126.  Patient stated that she has experienced several low CBGs this morning, so advised to closely monitor patient.

## 2014-06-29 ENCOUNTER — Other Ambulatory Visit: Payer: Self-pay | Admitting: *Deleted

## 2014-06-29 ENCOUNTER — Institutional Professional Consult (permissible substitution) (INDEPENDENT_AMBULATORY_CARE_PROVIDER_SITE_OTHER): Payer: PRIVATE HEALTH INSURANCE | Admitting: Thoracic Surgery (Cardiothoracic Vascular Surgery)

## 2014-06-29 ENCOUNTER — Encounter: Payer: Self-pay | Admitting: Thoracic Surgery (Cardiothoracic Vascular Surgery)

## 2014-06-29 VITALS — BP 124/82 | HR 74 | Resp 20 | Ht 60.0 in | Wt 123.0 lb

## 2014-06-29 DIAGNOSIS — I2510 Atherosclerotic heart disease of native coronary artery without angina pectoris: Secondary | ICD-10-CM

## 2014-06-29 DIAGNOSIS — I429 Cardiomyopathy, unspecified: Secondary | ICD-10-CM

## 2014-06-29 DIAGNOSIS — I340 Nonrheumatic mitral (valve) insufficiency: Secondary | ICD-10-CM

## 2014-06-29 NOTE — Progress Notes (Signed)
[address]       [place] [address]             [phone]      PCP is [name], MD Referring Provider is [name], MD [name], MD  Chief Complaint  Patient presents with  . Coronary Artery Disease    Surgical eval for possible CABG, cardiac cath 06/28/14, 2D Echo 05/16/14    HPI: 44 year old woman sent for consultation regarding coronary artery disease and mitral insufficiency.  Priscilla [name] is a 44 year old woman with a complex medical history. She's had type 1 diabetes since age 29. This was complicated by renal failure for which she is on peritoneal dialysis. 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 She did not take her antirejection medications regularly due to cost. Both the kidney and pancreas grafts rejected and failed. She's been back on dialysis since June 2014. She had an AV graft placed at one time, but was never able to use it. She currently is on peritoneal dialysis.  She had a stress test as part of a workup for possible retransplantation at [facility]. It showed evidence of an inferior scar without ischemia. Her ejection fraction was 50%.  She was ill around the holidays this past December. She was having problems with chest discomfort and shortness of breath. She was diagnosed with pneumonia. She says that for "a couple of days" she had severe chest pressure that felt like an elephant standing on her chest. She thought this was due to the pneumonia. She was having trouble with peritoneal dialysis around that time as well. She eventually went to the doctor and was found to have a friction rub consistent with pericarditis. An echocardiogram was done which showed an ejection fraction of 35-40% with hypokinesis of the septal and inferior walls. There was mild mitral insufficiency and moderate to severe tricuspid insufficiency. She was referred to Dr. Sallyanne Kuster.  An electrocardiogram showed Q waves in the septal  and inferior leads and prominent anterior T-wave inversion. Those findings were new from May 2015.  She had cardiac catheterization yesterday. It revealed severe three-vessel disease including a tight proximal LAD lesion. There was moderate to severe MR on the left ventriculogram.  She has continued to work with the exception of a couple days around the holidays. She has not had any chest pain, pressure, or tightness since the event that Christmas. She says that she does get short of breath with exertion. She primarily notices this when she is walking up stairs or an incline. She has occasional leg swelling, but it is mild. She does complain of decreased energy.   Past Medical History  Diagnosis Date  . H/O kidney transplant   . Pancreas transplanted   . Diabetes mellitus without complication   . Hypertension   . Renal disorder   . Anemia   . GERD (gastroesophageal reflux disease)   . End stage renal disease   . Hyperlipidemia   . Cardiomyopathy, ischemic 06/24/2014    Past Surgical History  Procedure Laterality Date  . Combined kidney-pancreas transplant  05/2010    [facility]..  Pancreas left mid abdomen  . Cesarean section    . Laparoscopic insertion peritoneal catheter  07/18/2008    Dr Excell Seltzer  . Laparoscopic repositioning capd catheter  2020/12/1708    Dr Excell Seltzer  . Laparoscopic repositioning capd catheter  11/08/2008    Dr Excell Seltzer  . Omentectomy  11/08/2008  Dr Excell Seltzer  . Laparoscopic repositioning capd catheter  10/03/2009    Unplugging CAPD catheter Dr Excell Seltzer  . Insertion of dialysis catheter  September 28, 2012    Right upper chest  . Capd insertion N/A 09/08/2013    Procedure: Laparoscopic CAPD peritoneal dialysis catheter placement, possible lysis of adhesions   ;  Surgeon: Adin Hector, MD;  Location: [place];  Service: General;  Laterality: N/A;  . Left heart catheterization with coronary angiogram N/A 06/28/2014    Procedure: LEFT HEART CATHETERIZATION WITH  CORONARY ANGIOGRAM;  Surgeon: [name], MD;  Location: [facility];  Service: Cardiovascular;  Laterality: N/A;    Family History  Problem Relation Age of Onset  . CAD Mother   . Stroke Mother   . COPD Father     Social History History  Substance Use Topics  . Smoking status: Never Smoker   . Smokeless tobacco: Never Used  . Alcohol Use: No    Current Outpatient Prescriptions  Medication Sig Dispense Refill  . acetaminophen (TYLENOL) 325 MG tablet Take 650 mg by mouth every 4 (four) hours as needed for mild pain.    Marland Kitchen aspirin 325 MG tablet Take 325 mg by mouth daily.     . calcium acetate (PHOSLO) 667 MG capsule Take 2,001 mg by mouth 3 (three) times daily with meals.    . carvedilol (COREG) 12.5 MG tablet Take 1 tablet (12.5 mg total) by mouth 2 (two) times daily. 180 tablet 3  . gabapentin (NEURONTIN) 100 MG capsule Take 100 mg by mouth daily.    . insulin glargine (LANTUS) 100 UNIT/ML injection Inject 10 Units into the skin at bedtime.     . insulin lispro (HUMALOG) 100 UNIT/ML injection Inject 10-15 Units into the skin 3 (three) times daily before meals. Per sliding scale    . losartan (COZAAR) 100 MG tablet Take 100 mg by mouth daily.    . mirtazapine (REMERON) 15 MG tablet Take 15 mg by mouth at bedtime.    Marland Kitchen omeprazole (PRILOSEC) 20 MG capsule Take 20 mg by mouth daily as needed.     . simvastatin (ZOCOR) 5 MG tablet Take 5 mg by mouth at bedtime.      No current facility-administered medications for this visit.    Allergies  Allergen Reactions  . Pork-Derived Products Other (See Comments)    Pt is [place]    Review of Systems  Constitutional: Positive for fatigue. Negative for activity change and unexpected weight change.  HENT: Negative for dental problem.   Respiratory: Positive for shortness of breath (with exertion).   Cardiovascular: Positive for chest pain (episode in late December) and leg swelling (occassional mild).       No orthopnea or PND    Endocrine:       DM- difficult to control  Neurological: Negative.   Hematological: Negative for adenopathy. Does not bruise/bleed easily.  All other systems reviewed and are negative.   BP 124/82 mmHg  Pulse 74  Resp 20  Ht 5' (1.524 m)  Wt 123 lb (55.792 kg)  BMI 24.02 kg/m2  SpO2 95% Physical Exam  Constitutional: She is oriented to person, place, and time. She appears well-developed and well-nourished. No distress.  HENT:  Head: Normocephalic and atraumatic.  Eyes: EOM are normal. Pupils are equal, round, and reactive to light.  Neck: Neck supple. No JVD present. No thyromegaly present.  Cardiovascular: Normal rate and regular rhythm.  Exam reveals gallop (+S4). Exam reveals no friction  rub.   Murmur (diastolic at RUSB, systolic LLSB) heard. Pulmonary/Chest: Effort normal and breath sounds normal. She has no wheezes. She has no rales.  Abdominal: Soft. There is no tenderness.  PD catheter  Musculoskeletal: She exhibits no edema.  Lymphadenopathy:    She has no cervical adenopathy.  Neurological: She is alert and oriented to person, place, and time.  Vitals reviewed.    Diagnostic Tests: Echocardiogram 05/06/2014 Study Conclusions  - Left ventricle: Septal and mid and basal inferior wall hypokinesis. The cavity size was moderately dilated. Wall thickness was normal. Systolic function was moderately reduced. The estimated ejection fraction was in the range of 35% to 40%. - Aortic valve: There was trivial regurgitation. - Mitral valve: Calcified annulus. There was mild regurgitation. - Atrial septum: No defect or patent foramen ovale was identified. - Tricuspid valve: There was moderate-severe regurgitation.  Cardiac catheterization Angiographic Findings:  1. The left main coronary artery appears calcified and has very mild ostial disease, but is free of significant stenoses and bifurcates in the usual fashion into the left anterior descending artery and left  circumflex coronary artery.  2. The left anterior descending artery is a large-territory, but small caliber vessel that reaches the apex and generates three major diagonal branches. There is evidence of extensive and diffuse luminal irregularities and moderate calcification. The most important stenosis is a 95% proximal lesion. There is a 50-60% mid vessel stenosis and a 70-80% distal stebnosis. 3. The left circumflex coronary artery is a medium-size vessel non dominant vessel that generates two major oblique marginal arteries. There is evidence of extensive luminal irregularities and moderate calcification. Beyond the first OM (which is the larger branch) there is a 90% stenosis. The second OM is diffusely diseased. 4. The right coronary artery is a small-size dominant vessel that is diffusely diseased, calcified and subtotally occluded before it generates a posterior lateral ventricular system as well as the PDA. These can be seen filling via bridging collaterals and RV marginal collaterals. 5. The left ventricle is normal in size. The left ventricle systolic function is moderately-to-severely decreased with an estimated ejection fraction of 30-35%. Regional wall motion abnormalities are seen. The Mid-basal inferior wall is akinetic. The mid anterior wall is hypokinetic. The apex appears mildly dyskinetic. No left ventricular thrombus is seen. There is moderate to severe mitral insufficiency. The ascending aorta appears normal. There is no aortic valve stenosis by pullback. The left ventricular end-diastolic pressure is 23 mm Hg.    IMPRESSIONS:   Severe multivessel CAD with severe ischemic cardiomyopathy and moderate to severe secondary mitral insufficiency. Small caliber vessels with diffuse "diabetic" disease. Questionable surgical targets  RECOMMENDATION:   Consider surgical revascularization with LIMA to LAD and mitral valve repair. Not sure if the other stenotic vessels are graftable. May  need to consider viability study (24 hour Thallium rest redistribution) to assess the anterior wall. She is already on near maximal medical therapy. CHF is well compensated on peritoneal dialysis. If surgery is not an option, consider PCI of proximal LAD artery.         Impression: 44 year old woman with a complex medical history including long-standing brittle type 1 diabetes with end-stage renal disease on peritoneal dialysis. She also has a history of previous kidney and pancreas transplants, both of which have rejected and failed. She was ill around Christmas with pneumonia, and from her history it sounds like she likely had an out of hospital MI. She developed pericarditis. She has been found to have ischemic  cardiomyopathy.  I personally reviewed the echocardiogram and catheterization images base my opinion on my interpretation.  By catheterization she has three-vessel coronary disease, including a tight proximal LAD stenosis. Her circumflex disease is rather minor and comes after the takeoff of a large obtuse marginal that is not affected. Her right coronary disease is severe. Her targets do not appear appropriate for bypass on catheterization, but we will check those intraoperatively to see if there is an acceptable target vessel for grafting.  In regards to her valvular disease. She had moderate to severe tricuspid regurgitation on echo, but does not have any signs or symptoms of right heart failure on exam currently. At catheterization she had moderate to severe mitral insufficiency. On her echo in January there was only mild mitral insufficiency. It is unclear if this worsened in the interim or if there could be a component of catheter induced MR at catheterization. She will need an intraoperative transesophageal echocardiogram to further clarify these issues, and may need one or both valves repaired at the time of surgery.   My recommendation to Priscilla [name] was that we proceed  with coronary artery bypass grafting and possible mitral repair. We will definitely plan to do a left mammary to LAD graft. We will assess her right coronary runoff to see if there is any suitable target vessel for grafting, although this does not appear to be the case on catheterization. We will plan to do an intraoperative transesophageal echocardiogram to assess the mitral and tricuspid valves to see if repair is indicated.  I discussed the general nature of the procedure, the use of cardiopulmonary bypass, the need for general anesthesia, and the incisions to be used with the patient and her family. We discussed the expected hospital stay, overall recovery and short and long term outcomes. I reviewed the indications, risks, benefits, and alternatives. She understands the risks include, but are not limited to death, stroke, MI, DVT/PE, bleeding, possible need for transfusion, infections, cardiac arrhythmias, heart block requiring pacemaker placement, other organ system dysfunction including respiratory or GI complications, as well as the possibility of unforeseeable complications. She does understand that she is at elevated risk due to her brittle diabetes and renal failure. I do think both of those issues are manageable.   She accepts the risks and wishes to proceed. She wishes to wait until March 21 to allow her to take care of some issues at work.  Plan:  Coronary artery bypass grafting, possible mitral repair on Monday 07/18/2014  Revonda Standard. Roxan Hockey, MD [facility] [phone]

## 2014-07-13 NOTE — Pre-Procedure Instructions (Signed)
Priscilla [name]  07/13/2014   Your procedure is scheduled on: Monday, March 21st   Report to [facility] Admitting at 5:30 AM.   Call this number if you have problems the morning of surgery: [phone]   Remember:   Do not eat food or drink liquids after midnight Sunday.   Take these medicines the morning of surgery with A SIP OF WATER: Carvedilol, Omeprazole   Do not wear jewelry, make-up or nail polish.  Do not wear lotions, powders, or perfumes. You may NOT wear deodorant the day of surgery.  Do not shave underarms & legs 48 hours prior to surgery.    Do not bring valuables to the hospital.  [facility] is not responsible for any belongings or valuables.               Contacts, dentures or bridgework may not be worn into surgery.  Leave suitcase in the car. After surgery it may be brought to your room.  For patients admitted to the hospital, discharge time is determined by your treatment team.               Name of driver:   Special Instructions: "Preparing for Surgery" instruction.   Please read over the following fact sheets that you were given: Pain Booklet, Coughing and Deep Breathing, Blood Transfusion Information, MRSA Information and Surgical Site Infection Prevention

## 2014-07-14 ENCOUNTER — Encounter (HOSPITAL_COMMUNITY)
Admission: RE | Admit: 2014-07-14 | Discharge: 2014-07-14 | Disposition: A | Discharge status: Home / Self Care | Payer: PRIVATE HEALTH INSURANCE | Source: Ambulatory Visit | Attending: Thoracic Surgery (Cardiothoracic Vascular Surgery) | Admitting: Thoracic Surgery (Cardiothoracic Vascular Surgery)

## 2014-07-14 ENCOUNTER — Ambulatory Visit (HOSPITAL_COMMUNITY)
Admission: RE | Admit: 2014-07-14 | Discharge: 2014-07-14 | Disposition: A | Discharge status: Home / Self Care | Payer: PRIVATE HEALTH INSURANCE | Source: Ambulatory Visit | Attending: Thoracic Surgery (Cardiothoracic Vascular Surgery) | Admitting: Thoracic Surgery (Cardiothoracic Vascular Surgery)

## 2014-07-14 ENCOUNTER — Other Ambulatory Visit (HOSPITAL_COMMUNITY): Payer: PRIVATE HEALTH INSURANCE

## 2014-07-14 ENCOUNTER — Encounter (HOSPITAL_COMMUNITY): Payer: Self-pay

## 2014-07-14 VITALS — BP 145/89 | HR 78 | Temp 97.7°F | Resp 18 | Ht 60.0 in | Wt 117.5 lb

## 2014-07-14 DIAGNOSIS — Z01812 Encounter for preprocedural laboratory examination: Secondary | ICD-10-CM | POA: Insufficient documentation

## 2014-07-14 DIAGNOSIS — I517 Cardiomegaly: Secondary | ICD-10-CM | POA: Diagnosis not present

## 2014-07-14 DIAGNOSIS — I340 Nonrheumatic mitral (valve) insufficiency: Secondary | ICD-10-CM

## 2014-07-14 DIAGNOSIS — I2510 Atherosclerotic heart disease of native coronary artery without angina pectoris: Secondary | ICD-10-CM | POA: Diagnosis not present

## 2014-07-14 DIAGNOSIS — I10 Essential (primary) hypertension: Secondary | ICD-10-CM | POA: Insufficient documentation

## 2014-07-14 DIAGNOSIS — Z0183 Encounter for blood typing: Secondary | ICD-10-CM | POA: Diagnosis not present

## 2014-07-14 DIAGNOSIS — Z01818 Encounter for other preprocedural examination: Secondary | ICD-10-CM | POA: Diagnosis not present

## 2014-07-14 DIAGNOSIS — E119 Type 2 diabetes mellitus without complications: Secondary | ICD-10-CM | POA: Insufficient documentation

## 2014-07-14 DIAGNOSIS — Z01810 Encounter for preprocedural cardiovascular examination: Secondary | ICD-10-CM | POA: Insufficient documentation

## 2014-07-14 LAB — PULMONARY FUNCTION TEST
DL/VA % PRED: 154 %
DL/VA: 6.54 ml/min/mmHg/L
DLCO UNC: 16.6 ml/min/mmHg
DLCO unc % pred: 87 %
FEF 25-75 POST: 1.84 L/s
FEF 25-75 Pre: 1.82 L/sec
FEF2575-%Change-Post: 1 %
FEF2575-%Pred-Post: 75 %
FEF2575-%Pred-Pre: 74 %
FEV1-%Change-Post: 0 %
FEV1-%Pred-Post: 68 %
FEV1-%Pred-Pre: 68 %
FEV1-POST: 1.46 L
FEV1-PRE: 1.46 L
FEV1FVC-%Change-Post: 0 %
FEV1FVC-%PRED-PRE: 103 %
FEV6-%Change-Post: 0 %
FEV6-%PRED-POST: 67 %
FEV6-%PRED-PRE: 66 %
FEV6-POST: 1.71 L
FEV6-Pre: 1.7 L
FEV6FVC-%PRED-PRE: 102 %
FEV6FVC-%Pred-Post: 102 %
FVC-%CHANGE-POST: 0 %
FVC-%PRED-POST: 65 %
FVC-%Pred-Pre: 65 %
FVC-Post: 1.71 L
FVC-Pre: 1.7 L
POST FEV6/FVC RATIO: 100 %
PRE FEV6/FVC RATIO: 100 %
Post FEV1/FVC ratio: 85 %
Pre FEV1/FVC ratio: 86 %
RV % PRED: 78 %
RV: 1.13 L
TLC % PRED: 65 %
TLC: 2.93 L

## 2014-07-14 LAB — HCG, SERUM, QUALITATIVE: Preg, Serum: NEGATIVE

## 2014-07-14 LAB — COMPREHENSIVE METABOLIC PANEL
ALK PHOS: 99 U/L (ref 39–117)
ALT: 15 U/L (ref 0–35)
AST: 16 U/L (ref 0–37)
Albumin: 2.6 g/dL — ABNORMAL LOW (ref 3.5–5.2)
Anion gap: 20 — ABNORMAL HIGH (ref 5–15)
BUN: 45 mg/dL — ABNORMAL HIGH (ref 6–23)
CHLORIDE: 96 mmol/L (ref 96–112)
CO2: 21 mmol/L (ref 19–32)
CREATININE: 13.81 mg/dL — AB (ref 0.50–1.10)
Calcium: 7 mg/dL — ABNORMAL LOW (ref 8.4–10.5)
GFR calc non Af Amer: 3 mL/min — ABNORMAL LOW (ref >90)
GFR, EST AFRICAN AMERICAN: 3 mL/min — AB (ref >90)
Glucose, Bld: 135 mg/dL — ABNORMAL HIGH (ref 70–99)
Potassium: 3.1 mmol/L — ABNORMAL LOW (ref 3.5–5.1)
Sodium: 137 mmol/L (ref 135–145)
Total Bilirubin: 1.1 mg/dL (ref 0.3–1.2)
Total Protein: 6.2 g/dL (ref 6.0–8.3)

## 2014-07-14 LAB — APTT: aPTT: 35 seconds (ref 24–37)

## 2014-07-14 LAB — CBC
HEMATOCRIT: 41.8 % (ref 36.0–46.0)
Hemoglobin: 13.5 g/dL (ref 12.0–15.0)
MCH: 24.6 pg — ABNORMAL LOW (ref 26.0–34.0)
MCHC: 32.3 g/dL (ref 30.0–36.0)
MCV: 76.3 fL — ABNORMAL LOW (ref 78.0–100.0)
Platelets: 215 10*3/uL (ref 150–400)
RBC: 5.48 MIL/uL — ABNORMAL HIGH (ref 3.87–5.11)
RDW: 16.5 % — ABNORMAL HIGH (ref 11.5–15.5)
WBC: 7.5 10*3/uL (ref 4.0–10.5)

## 2014-07-14 LAB — PROTIME-INR
INR: 1.09 (ref 0.00–1.49)
PROTHROMBIN TIME: 14.2 s (ref 11.6–15.2)

## 2014-07-14 LAB — SURGICAL PCR SCREEN
MRSA, PCR: NEGATIVE
Staphylococcus aureus: NEGATIVE

## 2014-07-14 LAB — ABO/RH: ABO/RH(D): O POS

## 2014-07-14 MED ORDER — ALBUTEROL SULFATE (2.5 MG/3ML) 0.083% IN NEBU
2.5000 mg | INHALATION_SOLUTION | Freq: Once | RESPIRATORY_TRACT | Status: AC
  Administered 2014-07-14: 2.5 mg via RESPIRATORY_TRACT

## 2014-07-14 NOTE — Progress Notes (Addendum)
Patient has had PFT's & Dopplers.   She is also a renal patient and does not urinate anymore.  I spoke with Kem Parkinson, RN at [facility] and relayed info and UA was cancelled.   She sees Dr. Alfonso Ellis @ [facility].  DA  Lab unable to get ABG...will order one for DOS.

## 2014-07-14 NOTE — Progress Notes (Signed)
Pre-op Cardiac Surgery  Carotid Findings:  1-39% ICA stenosis.  Vertebral artery flow is antegrade.   Upper Extremity Right Left  Brachial Pressures 153T 147T  Radial Waveforms T T  Ulnar Waveforms T T  Palmar Arch (Allen's Test) WNL WNL   Findings:      Lower  Extremity Right Left  Dorsalis Pedis    Anterior Tibial 140B 147B  Posterior Tibial 143B 204B  Ankle/Brachial Indices 0.93 >1.0    Findings:

## 2014-07-15 LAB — HEMOGLOBIN A1C
Hgb A1c MFr Bld: 10 % — ABNORMAL HIGH (ref 4.8–5.6)
Mean Plasma Glucose: 240 mg/dL

## 2014-07-17 MED ORDER — POTASSIUM CHLORIDE 2 MEQ/ML IV SOLN
80.0000 meq | INTRAVENOUS | Status: DC
  Filled 2014-07-17: qty 40

## 2014-07-17 MED ORDER — PHENYLEPHRINE HCL 10 MG/ML IJ SOLN
30.0000 ug/min | INTRAVENOUS | Status: AC
  Administered 2014-07-18: 15 ug/min via INTRAVENOUS
  Filled 2014-07-17: qty 2

## 2014-07-17 MED ORDER — SODIUM CHLORIDE 0.9 % IV SOLN
INTRAVENOUS | Status: DC
  Filled 2014-07-17: qty 30

## 2014-07-17 MED ORDER — DEXTROSE 5 % IV SOLN
750.0000 mg | INTRAVENOUS | Status: DC
  Filled 2014-07-17: qty 750

## 2014-07-17 MED ORDER — METOPROLOL TARTRATE 12.5 MG HALF TABLET: 12.5000 mg | ORAL_TABLET | Freq: Once | ORAL | Status: DC

## 2014-07-17 MED ORDER — AMINOCAPROIC ACID 250 MG/ML IV SOLN
INTRAVENOUS | Status: AC
  Administered 2014-07-18: 69 mL/h via INTRAVENOUS
  Filled 2014-07-17: qty 40

## 2014-07-17 MED ORDER — NITROGLYCERIN IN D5W 200-5 MCG/ML-% IV SOLN
2.0000 ug/min | INTRAVENOUS | Status: DC
  Filled 2014-07-17: qty 250

## 2014-07-17 MED ORDER — EPINEPHRINE HCL 1 MG/ML IJ SOLN
0.0000 ug/min | INTRAMUSCULAR | Status: DC
  Filled 2014-07-17: qty 4

## 2014-07-17 MED ORDER — DEXTROSE 5 % IV SOLN
1.5000 g | INTRAVENOUS | Status: AC
  Administered 2014-07-18: .75 g via INTRAVENOUS
  Administered 2014-07-18: 1.5 g via INTRAVENOUS
  Filled 2014-07-17: qty 1.5

## 2014-07-17 MED ORDER — DOPAMINE-DEXTROSE 3.2-5 MG/ML-% IV SOLN
0.0000 ug/kg/min | INTRAVENOUS | Status: AC
  Administered 2014-07-18: 3 ug/kg/min via INTRAVENOUS
  Filled 2014-07-17: qty 250

## 2014-07-17 MED ORDER — DEXMEDETOMIDINE HCL IN NACL 400 MCG/100ML IV SOLN
0.1000 ug/kg/h | INTRAVENOUS | Status: AC
  Administered 2014-07-18: .2 ug/kg/h via INTRAVENOUS
  Filled 2014-07-17: qty 100

## 2014-07-17 MED ORDER — PLASMA-LYTE 148 IV SOLN
INTRAVENOUS | Status: AC
  Administered 2014-07-18: 500 mL
  Filled 2014-07-17: qty 2.5

## 2014-07-17 MED ORDER — VANCOMYCIN HCL IN DEXTROSE 1-5 GM/200ML-% IV SOLN
1000.0000 mg | INTRAVENOUS | Status: AC
  Administered 2014-07-18: 1000 mg via INTRAVENOUS
  Filled 2014-07-17: qty 200

## 2014-07-17 MED ORDER — MAGNESIUM SULFATE 50 % IJ SOLN
40.0000 meq | INTRAMUSCULAR | Status: DC
  Filled 2014-07-17: qty 10

## 2014-07-17 MED ORDER — SODIUM CHLORIDE 0.9 % IV SOLN
INTRAVENOUS | Status: AC
  Administered 2014-07-18: .6 [IU]/h via INTRAVENOUS
  Filled 2014-07-17: qty 2.5

## 2014-07-18 ENCOUNTER — Inpatient Hospital Stay (HOSPITAL_COMMUNITY): Payer: Medicare Other | Admitting: Anesthesiology

## 2014-07-18 ENCOUNTER — Inpatient Hospital Stay (HOSPITAL_COMMUNITY): Payer: Medicare Other | Admitting: Vascular Surgery

## 2014-07-18 ENCOUNTER — Encounter (HOSPITAL_COMMUNITY)
Admission: RE | Disposition: A | Discharge status: Home / Self Care | Payer: PRIVATE HEALTH INSURANCE | Source: Ambulatory Visit | Attending: Thoracic Surgery (Cardiothoracic Vascular Surgery)

## 2014-07-18 ENCOUNTER — Inpatient Hospital Stay (HOSPITAL_COMMUNITY)
Admission: RE | Admit: 2014-07-18 | Discharge: 2014-07-25 | DRG: 219 | Disposition: A | Discharge status: Home / Self Care | Payer: Medicare Other | Source: Ambulatory Visit | Attending: Thoracic Surgery (Cardiothoracic Vascular Surgery) | Admitting: Thoracic Surgery (Cardiothoracic Vascular Surgery)

## 2014-07-18 ENCOUNTER — Inpatient Hospital Stay (HOSPITAL_COMMUNITY): Payer: Medicare Other

## 2014-07-18 ENCOUNTER — Encounter (HOSPITAL_COMMUNITY): Payer: Self-pay | Admitting: Surgery

## 2014-07-18 DIAGNOSIS — I255 Ischemic cardiomyopathy: Secondary | ICD-10-CM | POA: Diagnosis present

## 2014-07-18 DIAGNOSIS — N186 End stage renal disease: Secondary | ICD-10-CM | POA: Diagnosis present

## 2014-07-18 DIAGNOSIS — I340 Nonrheumatic mitral (valve) insufficiency: Secondary | ICD-10-CM | POA: Diagnosis not present

## 2014-07-18 DIAGNOSIS — I959 Hypotension, unspecified: Secondary | ICD-10-CM | POA: Diagnosis not present

## 2014-07-18 DIAGNOSIS — I2510 Atherosclerotic heart disease of native coronary artery without angina pectoris: Secondary | ICD-10-CM

## 2014-07-18 DIAGNOSIS — Z940 Kidney transplant status: Secondary | ICD-10-CM | POA: Diagnosis not present

## 2014-07-18 DIAGNOSIS — E785 Hyperlipidemia, unspecified: Secondary | ICD-10-CM | POA: Diagnosis present

## 2014-07-18 DIAGNOSIS — R0602 Shortness of breath: Secondary | ICD-10-CM

## 2014-07-18 DIAGNOSIS — I5042 Chronic combined systolic (congestive) and diastolic (congestive) heart failure: Secondary | ICD-10-CM | POA: Diagnosis not present

## 2014-07-18 DIAGNOSIS — I495 Sick sinus syndrome: Secondary | ICD-10-CM | POA: Diagnosis not present

## 2014-07-18 DIAGNOSIS — Z7982 Long term (current) use of aspirin: Secondary | ICD-10-CM

## 2014-07-18 DIAGNOSIS — R001 Bradycardia, unspecified: Secondary | ICD-10-CM | POA: Diagnosis not present

## 2014-07-18 DIAGNOSIS — E1022 Type 1 diabetes mellitus with diabetic chronic kidney disease: Secondary | ICD-10-CM | POA: Diagnosis present

## 2014-07-18 DIAGNOSIS — J90 Pleural effusion, not elsewhere classified: Secondary | ICD-10-CM

## 2014-07-18 DIAGNOSIS — Z951 Presence of aortocoronary bypass graft: Secondary | ICD-10-CM

## 2014-07-18 DIAGNOSIS — D631 Anemia in chronic kidney disease: Secondary | ICD-10-CM | POA: Diagnosis present

## 2014-07-18 DIAGNOSIS — Z79899 Other long term (current) drug therapy: Secondary | ICD-10-CM

## 2014-07-18 DIAGNOSIS — I120 Hypertensive chronic kidney disease with stage 5 chronic kidney disease or end stage renal disease: Secondary | ICD-10-CM | POA: Diagnosis present

## 2014-07-18 DIAGNOSIS — E10649 Type 1 diabetes mellitus with hypoglycemia without coma: Secondary | ICD-10-CM | POA: Diagnosis not present

## 2014-07-18 DIAGNOSIS — E8809 Other disorders of plasma-protein metabolism, not elsewhere classified: Secondary | ICD-10-CM | POA: Diagnosis not present

## 2014-07-18 DIAGNOSIS — Z9889 Other specified postprocedural states: Secondary | ICD-10-CM

## 2014-07-18 DIAGNOSIS — E876 Hypokalemia: Secondary | ICD-10-CM | POA: Diagnosis not present

## 2014-07-18 DIAGNOSIS — K219 Gastro-esophageal reflux disease without esophagitis: Secondary | ICD-10-CM | POA: Diagnosis present

## 2014-07-18 DIAGNOSIS — N2581 Secondary hyperparathyroidism of renal origin: Secondary | ICD-10-CM | POA: Diagnosis present

## 2014-07-18 DIAGNOSIS — Z992 Dependence on renal dialysis: Secondary | ICD-10-CM | POA: Diagnosis not present

## 2014-07-18 DIAGNOSIS — Z9483 Pancreas transplant status: Secondary | ICD-10-CM | POA: Diagnosis not present

## 2014-07-18 DIAGNOSIS — E872 Acidosis: Secondary | ICD-10-CM | POA: Diagnosis present

## 2014-07-18 DIAGNOSIS — Z794 Long term (current) use of insulin: Secondary | ICD-10-CM

## 2014-07-18 DIAGNOSIS — I25118 Atherosclerotic heart disease of native coronary artery with other forms of angina pectoris: Secondary | ICD-10-CM | POA: Diagnosis not present

## 2014-07-18 DIAGNOSIS — D62 Acute posthemorrhagic anemia: Secondary | ICD-10-CM | POA: Diagnosis not present

## 2014-07-18 HISTORY — PX: MITRAL VALVE REPAIR: SHX2039

## 2014-07-18 HISTORY — DX: Presence of aortocoronary bypass graft: Z95.1

## 2014-07-18 HISTORY — PX: CORONARY ARTERY BYPASS GRAFT: SHX141

## 2014-07-18 HISTORY — DX: Other specified postprocedural states: Z98.890

## 2014-07-18 HISTORY — PX: TEE WITHOUT CARDIOVERSION: SHX5443

## 2014-07-18 LAB — BLOOD GAS, ARTERIAL
Acid-base deficit: 2.6 mmol/L — ABNORMAL HIGH (ref 0.0–2.0)
Bicarbonate: 22.2 mEq/L (ref 20.0–24.0)
O2 Content: 2 L/min
O2 Saturation: 97.8 %
PH ART: 7.346 — AB (ref 7.350–7.450)
PO2 ART: 141 mmHg — AB (ref 80.0–100.0)
Patient temperature: 98.6
TCO2: 23.5 mmol/L (ref 0–100)
pCO2 arterial: 41.7 mmHg (ref 35.0–45.0)

## 2014-07-18 LAB — CBC
HCT: 39.9 % (ref 36.0–46.0)
HCT: 31.9 % — ABNORMAL LOW (ref 36.0–46.0)
Hemoglobin: 13 g/dL (ref 12.0–15.0)
Hemoglobin: 10.8 g/dL — ABNORMAL LOW (ref 12.0–15.0)
MCH: 25.2 pg — ABNORMAL LOW (ref 26.0–34.0)
MCH: 25.7 pg — ABNORMAL LOW (ref 26.0–34.0)
MCHC: 32.6 g/dL (ref 30.0–36.0)
MCHC: 33.9 g/dL (ref 30.0–36.0)
MCV: 77.5 fL — AB (ref 78.0–100.0)
MCV: 75.8 fL — ABNORMAL LOW (ref 78.0–100.0)
PLATELETS: 81 10*3/uL — AB (ref 150–400)
Platelets: 99 10*3/uL — ABNORMAL LOW (ref 150–400)
RBC: 5.15 MIL/uL — ABNORMAL HIGH (ref 3.87–5.11)
RBC: 4.21 MIL/uL (ref 3.87–5.11)
RDW: 16.6 % — ABNORMAL HIGH (ref 11.5–15.5)
RDW: 16.2 % — AB (ref 11.5–15.5)
WBC: 11.3 10*3/uL — AB (ref 4.0–10.5)
WBC: 8.3 10*3/uL (ref 4.0–10.5)

## 2014-07-18 LAB — POCT I-STAT, CHEM 8
BUN: 40 mg/dL — ABNORMAL HIGH (ref 6–23)
BUN: 37 mg/dL — ABNORMAL HIGH (ref 6–23)
BUN: 42 mg/dL — AB (ref 6–23)
BUN: 40 mg/dL — ABNORMAL HIGH (ref 6–23)
BUN: 40 mg/dL — ABNORMAL HIGH (ref 6–23)
BUN: 39 mg/dL — ABNORMAL HIGH (ref 6–23)
CALCIUM ION: 0.66 mmol/L — AB (ref 1.12–1.23)
CHLORIDE: 99 mmol/L (ref 96–112)
CHLORIDE: 102 mmol/L (ref 96–112)
CREATININE: 12.6 mg/dL — AB (ref 0.50–1.10)
CREATININE: 11.4 mg/dL — AB (ref 0.50–1.10)
Calcium, Ion: 0.79 mmol/L — ABNORMAL LOW (ref 1.12–1.23)
Calcium, Ion: 0.73 mmol/L — ABNORMAL LOW (ref 1.12–1.23)
Calcium, Ion: 0.79 mmol/L — ABNORMAL LOW (ref 1.12–1.23)
Calcium, Ion: 0.73 mmol/L — ABNORMAL LOW (ref 1.12–1.23)
Calcium, Ion: 0.88 mmol/L — ABNORMAL LOW (ref 1.12–1.23)
Chloride: 95 mmol/L — ABNORMAL LOW (ref 96–112)
Chloride: 97 mmol/L (ref 96–112)
Chloride: 99 mmol/L (ref 96–112)
Chloride: 101 mmol/L (ref 96–112)
Creatinine, Ser: 12.7 mg/dL — ABNORMAL HIGH (ref 0.50–1.10)
Creatinine, Ser: 11.1 mg/dL — ABNORMAL HIGH (ref 0.50–1.10)
Creatinine, Ser: 11 mg/dL — ABNORMAL HIGH (ref 0.50–1.10)
Creatinine, Ser: 10.7 mg/dL — ABNORMAL HIGH (ref 0.50–1.10)
GLUCOSE: 79 mg/dL (ref 70–99)
GLUCOSE: 78 mg/dL (ref 70–99)
GLUCOSE: 55 mg/dL — AB (ref 70–99)
GLUCOSE: 119 mg/dL — AB (ref 70–99)
GLUCOSE: 158 mg/dL — AB (ref 70–99)
Glucose, Bld: 98 mg/dL (ref 70–99)
HCT: 34 % — ABNORMAL LOW (ref 36.0–46.0)
HCT: 36 % (ref 36.0–46.0)
HCT: 27 % — ABNORMAL LOW (ref 36.0–46.0)
HEMATOCRIT: 25 % — AB (ref 36.0–46.0)
HEMATOCRIT: 23 % — AB (ref 36.0–46.0)
HEMATOCRIT: 26 % — AB (ref 36.0–46.0)
HEMOGLOBIN: 11.6 g/dL — AB (ref 12.0–15.0)
HEMOGLOBIN: 12.2 g/dL (ref 12.0–15.0)
HEMOGLOBIN: 8.5 g/dL — AB (ref 12.0–15.0)
HEMOGLOBIN: 9.2 g/dL — AB (ref 12.0–15.0)
Hemoglobin: 7.8 g/dL — ABNORMAL LOW (ref 12.0–15.0)
Hemoglobin: 8.8 g/dL — ABNORMAL LOW (ref 12.0–15.0)
POTASSIUM: 3.4 mmol/L — AB (ref 3.5–5.1)
POTASSIUM: 3.6 mmol/L (ref 3.5–5.1)
POTASSIUM: 5 mmol/L (ref 3.5–5.1)
POTASSIUM: 4.1 mmol/L (ref 3.5–5.1)
Potassium: 2.5 mmol/L — CL (ref 3.5–5.1)
Potassium: 2.9 mmol/L — ABNORMAL LOW (ref 3.5–5.1)
SODIUM: 137 mmol/L (ref 135–145)
SODIUM: 136 mmol/L (ref 135–145)
SODIUM: 137 mmol/L (ref 135–145)
SODIUM: 136 mmol/L (ref 135–145)
Sodium: 136 mmol/L (ref 135–145)
Sodium: 137 mmol/L (ref 135–145)
TCO2: 21 mmol/L (ref 0–100)
TCO2: 21 mmol/L (ref 0–100)
TCO2: 21 mmol/L (ref 0–100)
TCO2: 20 mmol/L (ref 0–100)
TCO2: 20 mmol/L (ref 0–100)
TCO2: 18 mmol/L (ref 0–100)

## 2014-07-18 LAB — PROTIME-INR
INR: 1.65 — ABNORMAL HIGH (ref 0.00–1.49)
Prothrombin Time: 19.7 seconds — ABNORMAL HIGH (ref 11.6–15.2)

## 2014-07-18 LAB — POCT I-STAT 3, ART BLOOD GAS (G3+)
ACID-BASE DEFICIT: 9 mmol/L — AB (ref 0.0–2.0)
Acid-base deficit: 4 mmol/L — ABNORMAL HIGH (ref 0.0–2.0)
Acid-base deficit: 3 mmol/L — ABNORMAL HIGH (ref 0.0–2.0)
BICARBONATE: 16 meq/L — AB (ref 20.0–24.0)
Bicarbonate: 21.5 mEq/L (ref 20.0–24.0)
Bicarbonate: 26 mEq/L — ABNORMAL HIGH (ref 20.0–24.0)
O2 Saturation: 100 %
O2 Saturation: 100 %
O2 Saturation: 95 %
PCO2 ART: 38.3 mmHg (ref 35.0–45.0)
PCO2 ART: 76.8 mmHg — AB (ref 35.0–45.0)
PH ART: 7.356 (ref 7.350–7.450)
PH ART: 7.137 — AB (ref 7.350–7.450)
PO2 ART: 405 mmHg — AB (ref 80.0–100.0)
TCO2: 23 mmol/L (ref 0–100)
TCO2: 28 mmol/L (ref 0–100)
TCO2: 17 mmol/L (ref 0–100)
pCO2 arterial: 28.6 mmHg — ABNORMAL LOW (ref 35.0–45.0)
pH, Arterial: 7.351 (ref 7.350–7.450)
pO2, Arterial: 339 mmHg — ABNORMAL HIGH (ref 80.0–100.0)
pO2, Arterial: 76 mmHg — ABNORMAL LOW (ref 80.0–100.0)

## 2014-07-18 LAB — HEMOGLOBIN AND HEMATOCRIT, BLOOD
HCT: 25.4 % — ABNORMAL LOW (ref 36.0–46.0)
HEMOGLOBIN: 8.4 g/dL — AB (ref 12.0–15.0)

## 2014-07-18 LAB — POCT I-STAT GLUCOSE
GLUCOSE: 99 mg/dL (ref 70–99)
Operator id: 3402

## 2014-07-18 LAB — PREPARE RBC (CROSSMATCH)

## 2014-07-18 LAB — CREATININE, SERUM
Creatinine, Ser: 11.41 mg/dL — ABNORMAL HIGH (ref 0.50–1.10)
GFR calc Af Amer: 4 mL/min — ABNORMAL LOW (ref >90)
GFR calc non Af Amer: 4 mL/min — ABNORMAL LOW (ref >90)

## 2014-07-18 LAB — POCT I-STAT 4, (NA,K, GLUC, HGB,HCT)
GLUCOSE: 97 mg/dL (ref 70–99)
Glucose, Bld: 149 mg/dL — ABNORMAL HIGH (ref 70–99)
HCT: 40 % (ref 36.0–46.0)
HCT: 41 % (ref 36.0–46.0)
HEMOGLOBIN: 13.6 g/dL (ref 12.0–15.0)
HEMOGLOBIN: 13.9 g/dL (ref 12.0–15.0)
Potassium: 3.3 mmol/L — ABNORMAL LOW (ref 3.5–5.1)
Potassium: 3.7 mmol/L (ref 3.5–5.1)
SODIUM: 138 mmol/L (ref 135–145)
Sodium: 139 mmol/L (ref 135–145)

## 2014-07-18 LAB — PLATELET COUNT: Platelets: 98 10*3/uL — ABNORMAL LOW (ref 150–400)

## 2014-07-18 LAB — MAGNESIUM: MAGNESIUM: 1.6 mg/dL (ref 1.5–2.5)

## 2014-07-18 LAB — GLUCOSE, CAPILLARY: Glucose-Capillary: 103 mg/dL — ABNORMAL HIGH (ref 70–99)

## 2014-07-18 LAB — APTT: APTT: 38 s — AB (ref 24–37)

## 2014-07-18 SURGERY — CORONARY ARTERY BYPASS GRAFTING (CABG)
Anesthesia: General | Site: Chest

## 2014-07-18 MED ORDER — PANTOPRAZOLE SODIUM 40 MG PO TBEC: 40.0000 mg | DELAYED_RELEASE_TABLET | Freq: Every day | ORAL | Status: DC

## 2014-07-18 MED ORDER — SODIUM CHLORIDE 0.45 % IV SOLN
INTRAVENOUS | Status: DC | PRN
  Administered 2014-07-18: 15:00:00 via INTRAVENOUS

## 2014-07-18 MED ORDER — FENTANYL CITRATE 0.05 MG/ML IJ SOLN
INTRAMUSCULAR | Status: AC
  Filled 2014-07-18: qty 5

## 2014-07-18 MED ORDER — SODIUM CHLORIDE 0.9 % IV SOLN
INTRAVENOUS | Status: DC | PRN
  Administered 2014-07-18 (×3): via INTRAVENOUS

## 2014-07-18 MED ORDER — FENTANYL CITRATE 0.05 MG/ML IJ SOLN
INTRAMUSCULAR | Status: DC | PRN
  Administered 2014-07-18: 100 ug via INTRAVENOUS
  Administered 2014-07-18: 150 ug via INTRAVENOUS
  Administered 2014-07-18 (×3): 250 ug via INTRAVENOUS
  Administered 2014-07-18: 225 ug via INTRAVENOUS
  Administered 2014-07-18: 25 ug via INTRAVENOUS

## 2014-07-18 MED ORDER — SODIUM CHLORIDE 0.9 % IV SOLN
INTRAVENOUS | Status: DC
  Administered 2014-07-18: 0.8 [IU]/h via INTRAVENOUS
  Filled 2014-07-18: qty 2.5

## 2014-07-18 MED ORDER — DEXMEDETOMIDINE HCL IN NACL 200 MCG/50ML IV SOLN: 0.0000 ug/kg/h | INTRAVENOUS | Status: DC

## 2014-07-18 MED ORDER — SODIUM CHLORIDE 0.9 % IJ SOLN
INTRAMUSCULAR | Status: DC | PRN
  Administered 2014-07-18: 4 mL via TOPICAL

## 2014-07-18 MED ORDER — PROTAMINE SULFATE 10 MG/ML IV SOLN
INTRAVENOUS | Status: AC
  Filled 2014-07-18: qty 25

## 2014-07-18 MED ORDER — CHLORHEXIDINE GLUCONATE 4 % EX LIQD
30.0000 mL | CUTANEOUS | Status: DC
  Filled 2014-07-18: qty 30

## 2014-07-18 MED ORDER — MIDAZOLAM HCL 5 MG/5ML IJ SOLN
INTRAMUSCULAR | Status: DC | PRN
  Administered 2014-07-18: 2 mg via INTRAVENOUS
  Administered 2014-07-18: 5 mg via INTRAVENOUS
  Administered 2014-07-18: 3 mg via INTRAVENOUS

## 2014-07-18 MED ORDER — METOPROLOL TARTRATE 12.5 MG HALF TABLET
12.5000 mg | ORAL_TABLET | Freq: Two times a day (BID) | ORAL | Status: DC
  Filled 2014-07-18 (×3): qty 1

## 2014-07-18 MED ORDER — VECURONIUM BROMIDE 10 MG IV SOLR
INTRAVENOUS | Status: AC
  Filled 2014-07-18: qty 10

## 2014-07-18 MED ORDER — EPHEDRINE SULFATE 50 MG/ML IJ SOLN
INTRAMUSCULAR | Status: DC | PRN
Start: 2014-07-18 — End: 2014-07-18
  Administered 2014-07-18: 5 mg via INTRAVENOUS
  Administered 2014-07-18: 10 mg via INTRAVENOUS

## 2014-07-18 MED ORDER — HEPARIN SODIUM (PORCINE) 1000 UNIT/ML IJ SOLN
INTRAMUSCULAR | Status: DC | PRN
  Administered 2014-07-18: 13000 [IU] via INTRAVENOUS
  Administered 2014-07-18: 2000 [IU] via INTRAVENOUS

## 2014-07-18 MED ORDER — LIDOCAINE HCL (CARDIAC) 20 MG/ML IV SOLN
INTRAVENOUS | Status: DC | PRN
  Administered 2014-07-18: 60 mg via INTRAVENOUS

## 2014-07-18 MED ORDER — ACETAMINOPHEN 650 MG RE SUPP
650.0000 mg | Freq: Once | RECTAL | Status: AC
  Administered 2014-07-18: 650 mg via RECTAL

## 2014-07-18 MED ORDER — DEXTROSE 50 % IV SOLN
INTRAVENOUS | Status: DC | PRN
  Administered 2014-07-18: 25 mL via INTRAVENOUS

## 2014-07-18 MED ORDER — ACETAMINOPHEN 160 MG/5ML PO SOLN
1000.0000 mg | Freq: Four times a day (QID) | ORAL | Status: AC
  Administered 2014-07-18: 1000 mg
  Filled 2014-07-18: qty 40.6

## 2014-07-18 MED ORDER — SODIUM CHLORIDE 0.9 % IV SOLN
INTRAVENOUS | Status: DC
  Administered 2014-07-21: 06:00:00 via INTRAVENOUS

## 2014-07-18 MED ORDER — ASPIRIN 81 MG PO CHEW
324.0000 mg | CHEWABLE_TABLET | Freq: Every day | ORAL | Status: DC
  Administered 2014-07-24: 324 mg
  Filled 2014-07-18: qty 4

## 2014-07-18 MED ORDER — PROTAMINE SULFATE 10 MG/ML IV SOLN
INTRAVENOUS | Status: DC | PRN
  Administered 2014-07-18 (×6): 20 mg via INTRAVENOUS

## 2014-07-18 MED ORDER — MIDAZOLAM HCL 2 MG/2ML IJ SOLN: 2.0000 mg | INTRAMUSCULAR | Status: DC | PRN

## 2014-07-18 MED ORDER — SODIUM CHLORIDE 0.9 % IV SOLN
250.0000 mL | INTRAVENOUS | Status: DC
  Administered 2014-07-18: 250 mL via INTRAVENOUS

## 2014-07-18 MED ORDER — POTASSIUM CHLORIDE 10 MEQ/50ML IV SOLN: 10.0000 meq | INTRAVENOUS | Status: DC

## 2014-07-18 MED ORDER — FAMOTIDINE IN NACL 20-0.9 MG/50ML-% IV SOLN
20.0000 mg | Freq: Two times a day (BID) | INTRAVENOUS | Status: AC
  Administered 2014-07-18 (×2): 20 mg via INTRAVENOUS
  Filled 2014-07-18: qty 50

## 2014-07-18 MED ORDER — LACTATED RINGERS IV SOLN: 500.0000 mL | Freq: Once | INTRAVENOUS | Status: AC | PRN

## 2014-07-18 MED ORDER — ASPIRIN EC 325 MG PO TBEC
325.0000 mg | DELAYED_RELEASE_TABLET | Freq: Every day | ORAL | Status: DC
  Administered 2014-07-19 – 2014-07-25 (×6): 325 mg via ORAL
  Filled 2014-07-18 (×7): qty 1

## 2014-07-18 MED ORDER — MORPHINE SULFATE 2 MG/ML IJ SOLN
2.0000 mg | INTRAMUSCULAR | Status: DC | PRN
  Administered 2014-07-18 – 2014-07-19 (×3): 2 mg via INTRAVENOUS
  Filled 2014-07-18 (×2): qty 2
  Filled 2014-07-18 (×3): qty 1

## 2014-07-18 MED ORDER — SODIUM BICARBONATE 8.4 % IV SOLN
50.0000 meq | Freq: Once | INTRAVENOUS | Status: AC
  Administered 2014-07-18: 50 meq via INTRAVENOUS

## 2014-07-18 MED ORDER — CETYLPYRIDINIUM CHLORIDE 0.05 % MT LIQD
7.0000 mL | Freq: Four times a day (QID) | OROMUCOSAL | Status: DC
  Administered 2014-07-19 (×2): 7 mL via OROMUCOSAL

## 2014-07-18 MED ORDER — ACETAMINOPHEN 500 MG PO TABS
1000.0000 mg | ORAL_TABLET | Freq: Four times a day (QID) | ORAL | Status: AC
  Administered 2014-07-19 – 2014-07-23 (×9): 1000 mg via ORAL
  Filled 2014-07-18 (×20): qty 2

## 2014-07-18 MED ORDER — BISACODYL 10 MG RE SUPP: 10.0000 mg | Freq: Every day | RECTAL | Status: DC

## 2014-07-18 MED ORDER — PROPOFOL 10 MG/ML IV BOLUS
INTRAVENOUS | Status: AC
  Filled 2014-07-18: qty 20

## 2014-07-18 MED ORDER — PHENYLEPHRINE 40 MCG/ML (10ML) SYRINGE FOR IV PUSH (FOR BLOOD PRESSURE SUPPORT)
PREFILLED_SYRINGE | INTRAVENOUS | Status: AC
  Filled 2014-07-18: qty 10

## 2014-07-18 MED ORDER — DOPAMINE-DEXTROSE 3.2-5 MG/ML-% IV SOLN: 0.0000 ug/kg/min | INTRAVENOUS | Status: DC

## 2014-07-18 MED ORDER — ONDANSETRON HCL 4 MG/2ML IJ SOLN
4.0000 mg | Freq: Four times a day (QID) | INTRAMUSCULAR | Status: DC | PRN
  Administered 2014-07-19 – 2014-07-21 (×3): 4 mg via INTRAVENOUS
  Filled 2014-07-18 (×3): qty 2

## 2014-07-18 MED ORDER — SIMVASTATIN 5 MG PO TABS
5.0000 mg | ORAL_TABLET | Freq: Every day | ORAL | Status: DC
  Administered 2014-07-19 – 2014-07-24 (×5): 5 mg via ORAL
  Filled 2014-07-18 (×7): qty 1

## 2014-07-18 MED ORDER — MILRINONE IN DEXTROSE 20 MG/100ML IV SOLN
0.3750 ug/kg/min | INTRAVENOUS | Status: DC
  Administered 2014-07-19: 0.375 ug/kg/min via INTRAVENOUS
  Filled 2014-07-18: qty 100

## 2014-07-18 MED ORDER — OXYCODONE HCL 5 MG PO TABS
5.0000 mg | ORAL_TABLET | ORAL | Status: DC | PRN
  Administered 2014-07-19 – 2014-07-20 (×2): 5 mg via ORAL
  Filled 2014-07-18 (×2): qty 1

## 2014-07-18 MED ORDER — GABAPENTIN 100 MG PO CAPS
100.0000 mg | ORAL_CAPSULE | Freq: Every day | ORAL | Status: DC
  Administered 2014-07-19 – 2014-07-25 (×6): 100 mg via ORAL
  Filled 2014-07-18 (×7): qty 1

## 2014-07-18 MED ORDER — MAGNESIUM SULFATE 4 GM/100ML IV SOLN: 4.0000 g | Freq: Once | INTRAVENOUS | Status: DC

## 2014-07-18 MED ORDER — ALBUMIN HUMAN 5 % IV SOLN
250.0000 mL | INTRAVENOUS | Status: AC | PRN
  Administered 2014-07-18 – 2014-07-19 (×4): 250 mL via INTRAVENOUS
  Filled 2014-07-18: qty 250

## 2014-07-18 MED ORDER — MILRINONE IN DEXTROSE 20 MG/100ML IV SOLN: 0.3750 ug/kg/min | INTRAVENOUS | Status: AC

## 2014-07-18 MED ORDER — HEPARIN SODIUM (PORCINE) 1000 UNIT/ML IJ SOLN
INTRAMUSCULAR | Status: AC
  Filled 2014-07-18: qty 1

## 2014-07-18 MED ORDER — BISACODYL 5 MG PO TBEC
10.0000 mg | DELAYED_RELEASE_TABLET | Freq: Every day | ORAL | Status: DC
  Administered 2014-07-19 – 2014-07-24 (×4): 10 mg via ORAL
  Filled 2014-07-18 (×4): qty 2

## 2014-07-18 MED ORDER — ACETAMINOPHEN 160 MG/5ML PO SOLN: 650.0000 mg | Freq: Once | ORAL | Status: AC

## 2014-07-18 MED ORDER — LACTATED RINGERS IV SOLN: INTRAVENOUS | Status: DC

## 2014-07-18 MED ORDER — INSULIN REGULAR BOLUS VIA INFUSION
0.0000 [IU] | Freq: Three times a day (TID) | INTRAVENOUS | Status: DC
  Filled 2014-07-18: qty 10

## 2014-07-18 MED ORDER — TRAMADOL HCL 50 MG PO TABS
50.0000 mg | ORAL_TABLET | ORAL | Status: DC | PRN
Start: 2014-07-18 — End: 2014-07-25
  Administered 2014-07-23 – 2014-07-25 (×3): 100 mg via ORAL
  Filled 2014-07-18 (×3): qty 2

## 2014-07-18 MED ORDER — DEXTROSE 5 % IV SOLN
1.5000 g | Freq: Two times a day (BID) | INTRAVENOUS | Status: AC
  Administered 2014-07-18 – 2014-07-20 (×4): 1.5 g via INTRAVENOUS
  Filled 2014-07-18 (×4): qty 1.5

## 2014-07-18 MED ORDER — SODIUM CHLORIDE 0.9 % IJ SOLN
3.0000 mL | INTRAMUSCULAR | Status: DC | PRN
  Administered 2014-07-19: 3 mL via INTRAVENOUS
  Filled 2014-07-18: qty 3

## 2014-07-18 MED ORDER — NITROGLYCERIN IN D5W 200-5 MCG/ML-% IV SOLN: 0.0000 ug/min | INTRAVENOUS | Status: DC

## 2014-07-18 MED ORDER — SODIUM CHLORIDE 0.9 % IJ SOLN
3.0000 mL | Freq: Two times a day (BID) | INTRAMUSCULAR | Status: DC
  Administered 2014-07-19 – 2014-07-25 (×11): 3 mL via INTRAVENOUS

## 2014-07-18 MED ORDER — MILRINONE IN DEXTROSE 20 MG/100ML IV SOLN
0.3750 ug/kg/min | INTRAVENOUS | Status: AC
  Administered 2014-07-18: .3 ug/kg/min via INTRAVENOUS
  Filled 2014-07-18: qty 100

## 2014-07-18 MED ORDER — DEXTROSE 50 % IV SOLN
INTRAVENOUS | Status: AC
  Filled 2014-07-18: qty 50

## 2014-07-18 MED ORDER — PHENYLEPHRINE HCL 10 MG/ML IJ SOLN: 0.0000 ug/min | INTRAVENOUS | Status: DC

## 2014-07-18 MED ORDER — DOPAMINE-DEXTROSE 3.2-5 MG/ML-% IV SOLN: 0.0000 ug/kg/min | INTRAVENOUS | Status: AC

## 2014-07-18 MED ORDER — METOPROLOL TARTRATE 1 MG/ML IV SOLN: 2.5000 mg | INTRAVENOUS | Status: DC | PRN

## 2014-07-18 MED ORDER — EPHEDRINE SULFATE 50 MG/ML IJ SOLN
INTRAMUSCULAR | Status: AC
  Filled 2014-07-18: qty 1

## 2014-07-18 MED ORDER — METOPROLOL TARTRATE 25 MG/10 ML ORAL SUSPENSION
12.5000 mg | Freq: Two times a day (BID) | ORAL | Status: DC
  Filled 2014-07-18 (×3): qty 5

## 2014-07-18 MED ORDER — VANCOMYCIN HCL IN DEXTROSE 1-5 GM/200ML-% IV SOLN
1000.0000 mg | Freq: Once | INTRAVENOUS | Status: AC
  Administered 2014-07-18: 1000 mg via INTRAVENOUS
  Filled 2014-07-18: qty 200

## 2014-07-18 MED ORDER — VECURONIUM BROMIDE 10 MG IV SOLR
INTRAVENOUS | Status: DC | PRN
  Administered 2014-07-18: 3 mg via INTRAVENOUS
  Administered 2014-07-18: 2 mg via INTRAVENOUS
  Administered 2014-07-18: 4 mg via INTRAVENOUS
  Administered 2014-07-18: 1 mg via INTRAVENOUS

## 2014-07-18 MED ORDER — MIDAZOLAM HCL 10 MG/2ML IJ SOLN
INTRAMUSCULAR | Status: AC
  Filled 2014-07-18: qty 2

## 2014-07-18 MED ORDER — PROPOFOL 10 MG/ML IV BOLUS
INTRAVENOUS | Status: DC | PRN
  Administered 2014-07-18: 90 mg via INTRAVENOUS

## 2014-07-18 MED ORDER — PHENYLEPHRINE HCL 10 MG/ML IJ SOLN
10.0000 mg | INTRAVENOUS | Status: DC | PRN
  Administered 2014-07-18: 50 ug/min via INTRAVENOUS
  Administered 2014-07-18: 25 ug/min via INTRAVENOUS

## 2014-07-18 MED ORDER — DOCUSATE SODIUM 100 MG PO CAPS
200.0000 mg | ORAL_CAPSULE | Freq: Every day | ORAL | Status: DC
  Administered 2014-07-19 – 2014-07-24 (×4): 200 mg via ORAL
  Filled 2014-07-18 (×7): qty 2

## 2014-07-18 MED ORDER — CHLORHEXIDINE GLUCONATE 0.12 % MT SOLN
15.0000 mL | Freq: Two times a day (BID) | OROMUCOSAL | Status: DC
  Administered 2014-07-18 – 2014-07-20 (×5): 15 mL via OROMUCOSAL
  Filled 2014-07-18 (×5): qty 15

## 2014-07-18 MED ORDER — LACTATED RINGERS IV SOLN
INTRAVENOUS | Status: DC | PRN
  Administered 2014-07-18 (×3): via INTRAVENOUS

## 2014-07-18 MED ORDER — MORPHINE SULFATE 2 MG/ML IJ SOLN
1.0000 mg | INTRAMUSCULAR | Status: AC | PRN
  Administered 2014-07-18: 4 mg via INTRAVENOUS
  Administered 2014-07-18 – 2014-07-19 (×2): 2 mg via INTRAVENOUS
  Filled 2014-07-18: qty 1

## 2014-07-18 MED ORDER — HEMOSTATIC AGENTS (NO CHARGE) OPTIME
TOPICAL | Status: DC | PRN
  Administered 2014-07-18 (×3): 1 via TOPICAL

## 2014-07-18 MED ORDER — ROCURONIUM BROMIDE 100 MG/10ML IV SOLN
INTRAVENOUS | Status: DC | PRN
  Administered 2014-07-18: 50 mg via INTRAVENOUS

## 2014-07-18 SURGICAL SUPPLY — 134 items
ADAPTER CARDIO PERF ANTE/RETRO (ADAPTER) ×3 IMPLANT
ADAPTER MULTI PERFUSION 15 (ADAPTER) ×2 IMPLANT
ADPR PRFSN 84XANTGRD RTRGD (ADAPTER) ×2
BAG DECANTER FOR FLEXI CONT (MISCELLANEOUS) ×3 IMPLANT
BANDAGE ELASTIC 4 VELCRO ST LF (GAUZE/BANDAGES/DRESSINGS) ×3 IMPLANT
BANDAGE ELASTIC 6 VELCRO ST LF (GAUZE/BANDAGES/DRESSINGS) ×3 IMPLANT
BASKET HEART (ORDER IN 25'S) (MISCELLANEOUS) ×1
BASKET HEART (ORDER IN 25S) (MISCELLANEOUS) ×2 IMPLANT
BLADE 15 SAFETY STRL DISP (BLADE) ×2 IMPLANT
BLADE STERNUM SYSTEM 6 (BLADE) ×3 IMPLANT
BLADE SURG 15 STRL LF DISP TIS (BLADE) ×2 IMPLANT
BLADE SURG 15 STRL SS (BLADE) ×3
BLOOD HAEMOCONCENTR 700 MIDI (MISCELLANEOUS) ×1 IMPLANT
BNDG GAUZE ELAST 4 BULKY (GAUZE/BANDAGES/DRESSINGS) ×3 IMPLANT
CANISTER SUCTION 2500CC (MISCELLANEOUS) ×3 IMPLANT
CANN PRFSN 3/8XCNCT ST RT ANG (MISCELLANEOUS) ×2
CANNULA AORTIC ROOT 9FR (CANNULA) ×2 IMPLANT
CANNULA ARTERIAL NVNT 3/8 22FR (MISCELLANEOUS) IMPLANT
CANNULA EZ GLIDE AORTIC 21FR (CANNULA) ×3 IMPLANT
CANNULA GUNDRY RCSP 15FR (MISCELLANEOUS) ×1 IMPLANT
CANNULA PRFSN 3/8XCNCT RT ANG (MISCELLANEOUS) ×1 IMPLANT
CANNULA SUMP PERICARDIAL (CANNULA) ×2 IMPLANT
CANNULA VEN MTL TIP RT (MISCELLANEOUS) ×3
CANNULA VESSEL 3MM BLUNT TIP (CANNULA) ×2 IMPLANT
CANNULA VRC MALB SNGL STG 32FR (MISCELLANEOUS) ×1 IMPLANT
CATH CPB KIT HENDRICKSON (MISCELLANEOUS) ×3 IMPLANT
CATH ROBINSON RED A/P 18FR (CATHETERS) ×7 IMPLANT
CATH THORACIC 36FR (CATHETERS) ×3 IMPLANT
CATH THORACIC 36FR RT ANG (CATHETERS) ×3 IMPLANT
CLIP FOGARTY SPRING 6M (CLIP) IMPLANT
CLIP TI MEDIUM 24 (CLIP) IMPLANT
CLIP TI WIDE RED SMALL 24 (CLIP) ×2 IMPLANT
CONN 1/2X1/2X1/2  BEN (MISCELLANEOUS) ×1
CONN 1/2X1/2X1/2 BEN (MISCELLANEOUS) ×2 IMPLANT
CONN 3/8X1/2 ST GISH (MISCELLANEOUS) ×8 IMPLANT
CONN Y 3/8X3/8X3/8  BEN (MISCELLANEOUS)
CONN Y 3/8X3/8X3/8 BEN (MISCELLANEOUS) IMPLANT
CONT SPEC 4OZ CLIKSEAL STRL BL (MISCELLANEOUS) ×1 IMPLANT
CRADLE DONUT ADULT HEAD (MISCELLANEOUS) ×3 IMPLANT
DRAPE CARDIOVASCULAR INCISE (DRAPES) ×3
DRAPE SLUSH/WARMER DISC (DRAPES) ×3 IMPLANT
DRAPE SRG 135X102X78XABS (DRAPES) ×2 IMPLANT
DRSG COVADERM 4X14 (GAUZE/BANDAGES/DRESSINGS) ×3 IMPLANT
ELECT CAUTERY BLADE 6.4 (BLADE) IMPLANT
ELECT REM PT RETURN 9FT ADLT (ELECTROSURGICAL) ×6
ELECTRODE REM PT RTRN 9FT ADLT (ELECTROSURGICAL) ×4 IMPLANT
GAUZE SPONGE 4X4 12PLY STRL (GAUZE/BANDAGES/DRESSINGS) ×6 IMPLANT
GLOVE SURG SIGNA 7.5 PF LTX (GLOVE) ×10 IMPLANT
GOWN STRL REUS W/ TWL LRG LVL3 (GOWN DISPOSABLE) ×8 IMPLANT
GOWN STRL REUS W/ TWL XL LVL3 (GOWN DISPOSABLE) ×4 IMPLANT
GOWN STRL REUS W/TWL LRG LVL3 (GOWN DISPOSABLE) ×12
GOWN STRL REUS W/TWL XL LVL3 (GOWN DISPOSABLE) ×6
HEMOSTAT POWDER SURGIFOAM 1G (HEMOSTASIS) ×9 IMPLANT
HEMOSTAT SURGICEL 2X14 (HEMOSTASIS) ×5 IMPLANT
INSERT FOGARTY XLG (MISCELLANEOUS) IMPLANT
KIT BASIN OR (CUSTOM PROCEDURE TRAY) ×3 IMPLANT
KIT DRAINAGE VACCUM ASSIST (KITS) ×1 IMPLANT
KIT ROOM TURNOVER OR (KITS) ×3 IMPLANT
KIT SUCTION CATH 14FR (SUCTIONS) ×6 IMPLANT
KIT VASOVIEW W/TROCAR VH 2000 (KITS) ×3 IMPLANT
LEAD PACING MYOCARDI (MISCELLANEOUS) ×2 IMPLANT
LINE VENT (MISCELLANEOUS) ×2 IMPLANT
LIQUID BAND (GAUZE/BANDAGES/DRESSINGS) ×3 IMPLANT
LOOP VESSEL SUPERMAXI WHITE (MISCELLANEOUS) ×2 IMPLANT
MARKER GRAFT CORONARY BYPASS (MISCELLANEOUS) ×9 IMPLANT
NDL SUT 1 .5 CRC FRENCH EYE (NEEDLE) ×1 IMPLANT
NEEDLE FRENCH EYE (NEEDLE) ×3
NS IRRIG 1000ML POUR BTL (IV SOLUTION) ×15 IMPLANT
PACK OPEN HEART (CUSTOM PROCEDURE TRAY) ×3 IMPLANT
PAD ARMBOARD 7.5X6 YLW CONV (MISCELLANEOUS) ×6 IMPLANT
PAD ELECT DEFIB RADIOL ZOLL (MISCELLANEOUS) ×3 IMPLANT
PENCIL BUTTON HOLSTER BLD 10FT (ELECTRODE) ×3 IMPLANT
PUNCH AORTIC ROTATE  4.5MM 8IN (MISCELLANEOUS) ×1 IMPLANT
PUNCH AORTIC ROTATE 4.0MM (MISCELLANEOUS) IMPLANT
PUNCH AORTIC ROTATE 4.5MM 8IN (MISCELLANEOUS) IMPLANT
PUNCH AORTIC ROTATE 5MM 8IN (MISCELLANEOUS) IMPLANT
RING MITRAL MEMO 3D 28MM SMD28 (Prosthesis & Implant Heart) ×2 IMPLANT
SET CARDIOPLEGIA MPS 5001102 (MISCELLANEOUS) ×1 IMPLANT
SPONGE GAUZE 4X4 12PLY STER LF (GAUZE/BANDAGES/DRESSINGS) ×4 IMPLANT
SUCKER WEIGHTED FLEX (MISCELLANEOUS) ×3 IMPLANT
SUT BONE WAX W31G (SUTURE) ×3 IMPLANT
SUT ETHIBOND (SUTURE) ×4 IMPLANT
SUT ETHIBOND 2 0 SH (SUTURE) ×9 IMPLANT
SUT ETHIBOND 2 0 SH 36X2 (SUTURE) ×4 IMPLANT
SUT ETHIBOND 2 0 V4 (SUTURE) IMPLANT
SUT ETHIBOND 2 0V4 GREEN (SUTURE) IMPLANT
SUT ETHIBOND NAB MH 2-0 36IN (SUTURE) ×2 IMPLANT
SUT MNCRL AB 4-0 PS2 18 (SUTURE) IMPLANT
SUT PROLENE 3 0 SH DA (SUTURE) ×3 IMPLANT
SUT PROLENE 3 0 SH1 36 (SUTURE) ×3 IMPLANT
SUT PROLENE 4 0 RB 1 (SUTURE) ×27
SUT PROLENE 4 0 SH DA (SUTURE) ×6 IMPLANT
SUT PROLENE 4-0 RB1 .5 CRCL 36 (SUTURE) ×11 IMPLANT
SUT PROLENE 5 0 C 1 36 (SUTURE) ×6 IMPLANT
SUT PROLENE 5 0 CC1 (SUTURE) ×3 IMPLANT
SUT PROLENE 6 0 C 1 30 (SUTURE) ×10 IMPLANT
SUT PROLENE 7 0 BV1 MDA (SUTURE) ×3 IMPLANT
SUT PROLENE 8 0 BV175 6 (SUTURE) IMPLANT
SUT SILK  1 MH (SUTURE) ×2
SUT SILK 1 MH (SUTURE) ×4 IMPLANT
SUT SILK 1 TIES 10X30 (SUTURE) ×4 IMPLANT
SUT SILK 2 0 (SUTURE) ×3
SUT SILK 2 0 SH CR/8 (SUTURE) ×6 IMPLANT
SUT SILK 2-0 18XBRD TIE 12 (SUTURE) ×2 IMPLANT
SUT SILK 3 0 SH CR/8 (SUTURE) ×3 IMPLANT
SUT SILK 4 0 (SUTURE) ×3
SUT SILK 4-0 18XBRD TIE 12 (SUTURE) ×2 IMPLANT
SUT STEEL 6MS V (SUTURE) ×3 IMPLANT
SUT STEEL STERNAL CCS#1 18IN (SUTURE) IMPLANT
SUT STEEL SZ 6 DBL 3X14 BALL (SUTURE) ×3 IMPLANT
SUT TEM PAC WIRE 2 0 SH (SUTURE) ×12 IMPLANT
SUT VIC AB 1 CTX 36 (SUTURE) ×6
SUT VIC AB 1 CTX36XBRD ANBCTR (SUTURE) ×4 IMPLANT
SUT VIC AB 2-0 CT1 27 (SUTURE) ×3
SUT VIC AB 2-0 CT1 TAPERPNT 27 (SUTURE) IMPLANT
SUT VIC AB 2-0 CTX 27 (SUTURE) IMPLANT
SUT VIC AB 3-0 SH 27 (SUTURE)
SUT VIC AB 3-0 SH 27X BRD (SUTURE) IMPLANT
SUT VIC AB 3-0 X1 27 (SUTURE) ×2 IMPLANT
SUT VICRYL 4-0 PS2 18IN ABS (SUTURE) IMPLANT
SUTURE E-PAK OPEN HEART (SUTURE) ×3 IMPLANT
SYSTEM SAHARA CHEST DRAIN ATS (WOUND CARE) ×3 IMPLANT
TAPE CLOTH SURG 4X10 WHT LF (GAUZE/BANDAGES/DRESSINGS) ×4 IMPLANT
TOWEL OR 17X24 6PK STRL BLUE (TOWEL DISPOSABLE) ×6 IMPLANT
TOWEL OR 17X26 10 PK STRL BLUE (TOWEL DISPOSABLE) ×6 IMPLANT
TRAY FOLEY IC TEMP SENS 16FR (CATHETERS) ×3 IMPLANT
TROCAR CLOSURE SUTURE GRASPER ×2 IMPLANT
TUBE FEEDING 8FR 16IN STR KANG (MISCELLANEOUS) ×3 IMPLANT
TUBE SUCT INTRACARD DLP 20F (MISCELLANEOUS) ×2 IMPLANT
TUBE SUCTION CARDIAC 10FR (CANNULA) ×4 IMPLANT
TUBING INSUFFLATION (TUBING) ×3 IMPLANT
UNDERPAD 30X30 INCONTINENT (UNDERPADS AND DIAPERS) ×3 IMPLANT
VRC MALLEABLE SINGLE STG 32FR (MISCELLANEOUS) ×3
WATER STERILE IRR 1000ML POUR (IV SOLUTION) ×6 IMPLANT

## 2014-07-18 NOTE — Progress Notes (Signed)
      [address]       [place] [address]             [phone]      Intubated  BP 111/70 mmHg  Pulse 87  Temp(Src) 97.3 F (36.3 C) (Oral)  Resp 12  Wt 117 lb (53.071 kg)  SpO2 100%  LMP 07/16/2014  PA= 24/23 CI= 1.8   Intake/Output Summary (Last 24 hours) at 07/18/14 1817 Last data filed at 07/18/14 1800  Gross per 24 hour  Intake 4279.95 ml  Output   1080 ml  Net 3199.95 ml   Hct =39 K=3.7   Overall doing well  Cardiac index OK, trying to minimize volume administration, lytes OK  Renal to see  Remo Lipps C. Roxan Hockey, MD [facility] [phone]

## 2014-07-18 NOTE — Consult Note (Signed)
Requesting Physician:  Dr. Roxan Hockey Reason for Consult:  Provision of dialysis and ESRD related services post CABG and MV repair Primary Nephrologist: Dr. Pearson Grippe HPI: The patient is a 44 y.o. 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 Joelyn Oms. During a workup for chest pain she was found to have a documented reduction in LV function from around 50% to the 35-40% range, ECG changes, and cardiac cath 06/28/14 that showed severe multivessel CAD with ischemic cardiomyopathy and moderate to severe mitral regurgitation. Today she underwent 2 vessel CABG (LIMA to LAD, SVG to PDA) and ring annuloplasty of the mitral valve and closure of PFO.  Post operatively she has had low cardiac index (1.3)  that responded to some volume (albumin) . Mean PA pressures 15-17. She has received 1 amp of bicarb for a pH of 7.35 with bicarb of 16 on ABG. Last K was 3.7 at 2:45 PM.    Past Medical History  Diagnosis Date  . H/O kidney transplant   . Pancreas transplanted   . Diabetes mellitus without complication   . Hypertension   . Renal disorder   . Anemia   . GERD (gastroesophageal reflux disease)   . End stage renal disease   . Hyperlipidemia   . Cardiomyopathy, ischemic 06/24/2014  . 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 S/P mitral valve repair 07/18/14    Past Surgical History  Procedure Laterality Date  . 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  Pancreas left mid abdomen  . Cesarean section    . Laparoscopic insertion peritoneal catheter  07/18/2008    Dr Excell Seltzer  . Laparoscopic repositioning capd catheter  02-28-202010    Dr Excell Seltzer  . Laparoscopic repositioning capd catheter  11/08/2008    Dr Excell Seltzer  . Omentectomy  11/08/2008    Dr Excell Seltzer  . Laparoscopic repositioning capd catheter  10/03/2009    Unplugging CAPD  catheter Dr Excell Seltzer  . Insertion of dialysis catheter  September 28, 2012    Right upper chest  . Capd insertion N/A 09/08/2013    Procedure: Laparoscopic CAPD peritoneal dialysis catheter placement, possible lysis of adhesions   ;  Surgeon: Adin Hector, MD;  Location: [place];  Service: General;  Laterality: N/A;  . Left heart catheterization with coronary angiogram N/A 06/28/2014    Procedure: LEFT HEART CATHETERIZATION WITH CORONARY ANGIOGRAM;  Surgeon: [name], MD;  Location: [facility];  Service: Cardiovascular;  Laterality: N/A;  . Cardiac catheterization      06/2014    Family History  Problem Relation Age of Onset  . CAD Mother   . Stroke Mother   . COPD Father    Social History:  reports that she has never smoked. She has never used smokeless tobacco. She reports that she does not drink alcohol or use illicit drugs.(I personally was unable to obtain social history)  Allergies  Allergen Reactions  . Pork-Derived Products Other (See Comments)    Pt is [place]    Prior to Admission medications   Medication Sig Start Date End Date Taking? Authorizing Provider  acetaminophen (TYLENOL) 325 MG tablet Take 650 mg by mouth every 4 (four) hours as needed for mild pain.   Yes Historical Provider, MD  aspirin 325 MG tablet Take 325 mg by mouth daily.  05/31/10  Yes Historical Provider,  MD  calcium acetate (PHOSLO) 667 MG capsule Take 2,001 mg by mouth 3 (three) times daily with meals.   Yes Historical Provider, MD  carvedilol (COREG) 12.5 MG tablet Take 1 tablet (12.5 mg total) by mouth 2 (two) times daily. 06/23/14  Yes Mihai Croitoru, MD  gabapentin (NEURONTIN) 100 MG capsule Take 100 mg by mouth daily.   Yes Historical Provider, MD  insulin glargine (LANTUS) 100 UNIT/ML injection Inject 10 Units into the skin at bedtime.    Yes Historical Provider, MD  insulin lispro (HUMALOG) 100 UNIT/ML injection Inject 10-15 Units into the skin 3 (three) times daily before meals. Per sliding scale    Yes Historical Provider, MD  losartan (COZAAR) 100 MG tablet Take 100 mg by mouth daily.   Yes Historical Provider, MD  mirtazapine (REMERON) 15 MG tablet Take 15 mg by mouth at bedtime.   Yes Historical Provider, MD  simvastatin (ZOCOR) 5 MG tablet Take 5 mg by mouth at bedtime.  09/29/12  Yes Historical Provider, MD  omeprazole (PRILOSEC) 20 MG capsule Take 20 mg by mouth daily as needed.  09/29/12 06/29/14  Historical Provider, MD    Inpatient medications: . [START ON 07/19/2014] acetaminophen  1,000 mg Oral 4 times per day   Or  . [START ON 07/19/2014] acetaminophen (TYLENOL) oral liquid 160 mg/5 mL  1,000 mg Per Tube 4 times per day  . [START ON 07/19/2014] antiseptic oral rinse  7 mL Mouth Rinse QID  . [START ON 07/19/2014] aspirin EC  325 mg Oral Daily   Or  . [START ON 07/19/2014] aspirin  324 mg Per Tube Daily  . [START ON 07/19/2014] bisacodyl  10 mg Oral Daily   Or  . [START ON 07/19/2014] bisacodyl  10 mg Rectal Daily  . cefUROXime (ZINACEF)  IV  1.5 g Intravenous Q12H  . chlorhexidine  15 mL Mouth Rinse BID  . [START ON 07/19/2014] docusate sodium  200 mg Oral Daily  . famotidine (PEPCID) IV  20 mg Intravenous Q12H  . [START ON 07/19/2014] gabapentin  100 mg Oral Daily  . insulin regular  0-10 Units Intravenous TID WC  . magnesium sulfate  4 g Intravenous Once  . metoprolol tartrate  12.5 mg Oral BID   Or  . metoprolol tartrate  12.5 mg Per Tube BID  . [START ON 07/20/2014] pantoprazole  40 mg Oral Daily  . [START ON 07/19/2014] simvastatin  5 mg Oral QHS  . [START ON 07/19/2014] sodium chloride  3 mL Intravenous Q12H  . vancomycin  1,000 mg Intravenous Once   . sodium chloride 10 mL/hr at 07/18/14 1800  . [START ON 07/19/2014] sodium chloride    . sodium chloride 20 mL/hr at 07/18/14 1800  . dexmedetomidine 0.3 mcg/kg/hr (07/18/14 1815)  . insulin (NOVOLIN-R) infusion 1 Units/hr (07/18/14 1800)  . lactated ringers Stopped (07/18/14 1518)  . lactated ringers Stopped (07/18/14 1518)   . nitroGLYCERIN 20 mcg/min (07/18/14 1840)  . phenylephrine (NEO-SYNEPHRINE) Adult infusion Stopped (07/18/14 1500)   Review of Systems Unobtainable as pt intubated  Physical Exam: BP 111/70 mmHg  Pulse 87  Temp(Src) 97.3 F (36.3 C) (Oral)  Resp 12  Wt 53.071 kg (117 lb)  SpO2 100%  LMP 07/16/2014  PAS/PAD 17/13 CI 1.8-2 Pre-op Weight 53 kg  EDW 50.5 kg Intubated, sedated, bare hugger R IJ SWAN, OT tube, chest tube, left abd PD catheter Sternal dressing in place BS clear anteriorly S1S2 No S3 Abd - PD catheter w/dressing in place.  Unable to discern if tenderness. Scars from prior KP transplant. Quiet. RLE wrapped to groin LLE with SCD in place, trace edema   Labs: Basic Metabolic Panel:  Recent Labs Lab 07/14/14 1457  07/18/14 0800 07/18/14 0947 07/18/14 1006 07/18/14 1112 07/18/14 1159 07/18/14 1210 07/18/14 1326 07/18/14 1440  NA 137  < > 137 137 136 136  --  137 136 139  K 3.1*  < > 3.4* 2.9* 2.5* 3.6  --  5.0 4.1 3.7  CL 96  --  99 97 95* 99  --  101 102  --   CO2 21  --   --   --   --   --   --   --   --   --   GLUCOSE 135*  < > 79 98 78 55* 99 119* 158* 149*  BUN 45*  --  40* 42* 37* 40*  --  40* 39*  --   CREATININE 13.81*  --  12.70* 12.60* 11.10* 11.40*  --  11.00* 10.70*  --   CALCIUM 7.0*  --   --   --   --   --   --   --   --   --   < > = values in this interval not displayed.   Recent Labs Lab 07/14/14 1457  AST 16  ALT 15  ALKPHOS 99  BILITOT 1.1  PROT 6.2  ALBUMIN 2.6*    Recent Labs Lab 07/14/14 1457  07/18/14 1205 07/18/14 1210 07/18/14 1326 07/18/14 1440 07/18/14 1447  WBC 7.5  --   --   --   --   --  11.3*  HGB 13.5  < > 8.4* 8.8* 9.2* 13.9 13.0  HCT 41.8  < > 25.4* 26.0* 27.0* 41.0 39.9  MCV 76.3*  --   --   --   --   --  77.5*  PLT 215  --  98*  --   --   --  81*  < > = values in this interval not displayed.  Recent Labs Lab 07/18/14 0558  GLUCAP 103*   ABG    Component Value Date/Time   PHART 7.351  07/18/2014 1445   PCO2ART 28.6* 07/18/2014 1445   PO2ART 76.0* 07/18/2014 1445   HCO3 16.0* 07/18/2014 1445   TCO2 17 07/18/2014 1445   ACIDBASEDEF 9.0* 07/18/2014 1445   O2SAT 95.0 07/18/2014 1445    Xrays/Other Studies: Dg Chest Port 1 View  07/18/2014   CLINICAL DATA:  Status post cardiac surgery.  EXAM: PORTABLE CHEST - 1 VIEW  COMPARISON:  07/14/2014  FINDINGS: The endotracheal tube is 2.2 cm above the carina. The right IJ Swan-Ganz catheter tip is in the proximal right pulmonary artery. The NG tube is in the stomach. External pacer wires are noted. Left-sided chest tube is in good position without pneumothorax.  The cardiac silhouette, mediastinal and hilar contours are stable. Mild vascular congestion and streaky areas of atelectasis. No pleural effusion or pneumothorax.  IMPRESSION: Postoperative support apparatus in good position without complicating features.  Mild vascular congestion and streaky areas of atelectasis.   Electronically Signed   By: Marijo Sanes M.D.   On: 07/18/2014 15:37     Pt's usual PD prescription: CCPD 5 exchanges/24 hours, 2 liters per exchange, dwell time 1.5 hours, no last fill (dry during the day), pause of 2 liters. Dry weight 50.5 kg. She averages 1.5-2.5 liters of UF using mostly 2.5% dialysate. Tends to run low potassiums,  07/05/14 labs showed BUN/creatinine of 45/14.1, K 3.3, Alb 2.7 Prescribed binders: phoslo 3ac, fosrenol 1 ac, and is supposed to be on calcitriol 0.25 mcg daily.   Background 44 y.o. 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 Joelyn Oms) with ischemic cardiomyopathy d/t multivessel CAD and severe MR.  She is s/p 2 vessel CABG (LIMA to LAD, SVG to PDA) and ring annuloplasty of the mitral valve/closure of PFO by Dr. Roxan Hockey (07/18/14).   Impression/Plan 1. S/p 2 vessel  CABG/MV repair/closure of PFO -  Still intubated. Nitro weaning. CI as low as 1.3 that responded to albumin. Still intubated.  2. ESRD d/t Type 1 DM with failed KP transplants, on CCPD - usually does nightly CCPD. No acute volume issues (over EDW but requiring some volume for low CI), last K was 3.7. Required 1 amp bicarb for metabolic acidosis. No emergent reason to resume her CCPD tonight. Will reassess in the AM, and probably resume her PD tomorrow.   3. Secondary hyperparathyroidism - resume binders and calcitriol when taking po 4. Anemia -  Hb on pre-op labs 3/17 was 13.5. Has had no recent requirement for ESA. Will continue to monitor inpt 5. Hypertension - po meds when able   Jamal Maes,  MD [facility]: [place] W [phone] pager 07/18/2014, 5:55 PM

## 2014-07-18 NOTE — Transfer of Care (Signed)
Immediate Anesthesia Transfer of Care Note  Patient: Priscilla [name]  Procedure(s) Performed: Procedure(s): CORONARY ARTERY BYPASS GRAFTING (CABG)X2 LIMA-LAD; SVG-PD (N/A) MITRAL VALVE REPAIR (MVR) (N/A) TRANSESOPHAGEAL ECHOCARDIOGRAM (TEE) (N/A)  Patient Location: ICU 2S03  Anesthesia Type:General  Level of Consciousness: sedated and Patient remains intubated per anesthesia plan  Airway & Oxygen Therapy: Patient remains intubated per anesthesia plan and Patient placed on Ventilator (see vital sign flow sheet for setting)  Post-op Assessment: Report given to RN and Post -op Vital signs reviewed and stable  Post vital signs: Reviewed and stable  Last Vitals:  Filed Vitals:   07/18/14 0549  BP: 96/71  Pulse: 74  Temp: 36.5 C  Resp: 18    Complications: No apparent anesthesia complications

## 2014-07-18 NOTE — Progress Notes (Signed)
1 amp bicarb given per Dr. Roxan Hockey due to blood gas. Priscilla [name]

## 2014-07-18 NOTE — Progress Notes (Signed)
  Echocardiogram Echocardiogram Transesophageal has been performed.  Joelene Millin 07/18/2014, 9:03 AM

## 2014-07-18 NOTE — Anesthesia Postprocedure Evaluation (Signed)
  Anesthesia Post-op Note  Patient: Priscilla [name]  Procedure(s) Performed: Procedure(s): CORONARY ARTERY BYPASS GRAFTING (CABG)X2 LIMA-LAD; SVG-PD (N/A) MITRAL VALVE REPAIR (MVR) (N/A) TRANSESOPHAGEAL ECHOCARDIOGRAM (TEE) (N/A)  Patient Location: ICU  Anesthesia Type:General  Level of Consciousness: sedated  Airway and Oxygen Therapy: Patient remains intubated per anesthesia plan  Post-op Pain: none  Post-op Assessment: Post-op Vital signs reviewed, Patient's Cardiovascular Status Stable and Respiratory Function Stable  Post-op Vital Signs: Reviewed and stable  Last Vitals:  Filed Vitals:   07/18/14 1425  BP: 139/76  Pulse: 80  Temp:   Resp: 27    Complications: No apparent anesthesia complications

## 2014-07-18 NOTE — Progress Notes (Signed)
Dr. Roxan Hockey came by and said it's okay to give another albumin if cardiac index gets down to 1.6. Priscilla [name]

## 2014-07-18 NOTE — OR Nursing (Signed)
2nd call made to SICU

## 2014-07-18 NOTE — Interval H&P Note (Signed)
History and Physical Interval Note:  07/18/2014 7:17 AM  Priscilla [name]  has presented today for surgery, with the diagnosis of CAD  MR  The various methods of treatment have been discussed with the patient and family. After consideration of risks, benefits and other options for treatment, the patient has consented to  Procedure(s): CORONARY ARTERY BYPASS GRAFTING (CABG) (N/A) MITRAL VALVE REPAIR (MVR) (N/A) TRANSESOPHAGEAL ECHOCARDIOGRAM (TEE) (N/A) as a surgical intervention .  The patient's history has been reviewed, patient examined, no change in status, stable for surgery.  I have reviewed the patient's chart and labs.  Questions were answered to the patient's satisfaction.     Melrose Nakayama

## 2014-07-18 NOTE — OR Nursing (Signed)
1st call made to SICU

## 2014-07-18 NOTE — Anesthesia Procedure Notes (Addendum)
Procedure Name: Intubation Date/Time: 07/18/2014 7:52 AM Performed by: [place] Pink Pre-anesthesia Checklist: Patient identified, Emergency Drugs available, Suction available, Patient being monitored and Timeout performed Patient Re-evaluated:Patient Re-evaluated prior to inductionOxygen Delivery Method: Circle system utilized Preoxygenation: Pre-oxygenation with 100% oxygen Intubation Type: IV induction Ventilation: Mask ventilation without difficulty Laryngoscope Size: Glidescope (small adult) Grade View: Grade II Tube type: Oral Tube size: 8.0 mm Number of attempts: 1 Airway Equipment and Method: Stylet and Video-laryngoscopy Placement Confirmation: ETT inserted through vocal cords under direct vision,  positive ETCO2 and breath sounds checked- equal and bilateral Secured at: 21 cm Tube secured with: Tape Dental Injury: Teeth and Oropharynx as per pre-operative assessment  Difficulty Due To: Difficulty was anticipated Comments: Glidescope utilized due to previous difficulty documented on intubation note from previous surgery    Procedures: Right IJ Gordy Councilman Catheter Insertion: The patient was identified and consent obtained.  TO was performed, and full barrier precautions were used.  The skin was anesthetized with lidocaine-4cc plain with 25g needle.  Once the vein was located with the 22 ga. needle using ultrasound guidance ,catheter at  45cm.  The patient tolerated the procedure well.   CE

## 2014-07-18 NOTE — Anesthesia Preprocedure Evaluation (Signed)
Anesthesia Evaluation  Patient identified by MRN, date of birth, ID band Patient awake    Reviewed: Allergy & Precautions, NPO status , Patient's Chart, lab work & pertinent test results  Airway Mallampati: II   Neck ROM: full    Dental   Pulmonary          Cardiovascular hypertension, + CAD + Valvular Problems/Murmurs MR  EF 30% on TTE. Moderate to severe MR found on cath.   Neuro/Psych    GI/Hepatic GERD-  ,S/p pancreas transplant   Endo/Other  diabetes, Type 2  Renal/GU ESRFRenal disease     Musculoskeletal   Abdominal   Peds  Hematology   Anesthesia Other Findings   Reproductive/Obstetrics                             Anesthesia Physical Anesthesia Plan  ASA: III  Anesthesia Plan: General   Post-op Pain Management:    Induction: Intravenous  Airway Management Planned: Oral ETT  Additional Equipment: Arterial line, CVP, PA Cath, TEE and Ultrasound Guidance Line Placement  Intra-op Plan:   Post-operative Plan: Post-operative intubation/ventilation  Informed Consent: I have reviewed the patients History and Physical, chart, labs and discussed the procedure including the risks, benefits and alternatives for the proposed anesthesia with the patient or authorized representative who has indicated his/her understanding and acceptance.     Plan Discussed with: CRNA, Anesthesiologist and Surgeon  Anesthesia Plan Comments:         Anesthesia Quick Evaluation

## 2014-07-18 NOTE — Brief Op Note (Addendum)
07/18/2014      [address]       [place] [address]             [id number]     07/18/2014  12:21 PM  PATIENT:  Priscilla [name]  44 y.o. female  PRE-OPERATIVE DIAGNOSIS:  3 vessel CAD, severe MR  POST-OPERATIVE DIAGNOSIS:  3 vessel CAD, moderate MR, moderate TR   PROCEDURE:  Procedure(s): CORONARY ARTERY BYPASS GRAFTING (CABG)X2   LIMA to LAD  SVG to PDA MITRAL VALVE REPAIR (MVR)#28 MEMO 3D RING ANNULOPLASTY CLOSURE PFO EVH RIGHT THIGH TRANSESOPHAGEAL ECHOCARDIOGRAM (TEE)   SURGEON:  Surgeon(s): Melrose Nakayama, MD  PHYSICIAN ASSISTANT: WAYNE GOLD PA-C  ANESTHESIA:   general  PATIENT CONDITION:  ICU - intubated and hemodynamically stable.  PRE-OPERATIVE WEIGHT: 53kg  EBL SEE ANEST/PERFUSION RECORDS  COMPLICATIONS: NO KNOWN  Mitral/Tricuspid/Pulmonary Valve Procedure  Mitral Valve Procedure Performed:  Repair: Annuloplasty.  Implant: Annuloplasty Device: Implant model number [id number], Size 28, Unique Device Identifier [id number] MEMO 3 D.       Mitral Valve Etiology  MV Insufficiency: Mild  MV Disease: Yes.  MV Stenosis: No mitral valve stenosis.  MV Disease Functional Class: MV Disease Functional Class: Type II.  Etiology (Choose at least one and up to five): Degenerative.  MV Lesions (Choose at least one): Mitral annular calcification.   Diffusely diseased targets. Good conduits Posterior annular dilatation, central MR, moderate to moderately severe, moderate TR, EF ~ 30% Post TEE- no residual MR Off pump with dopamine and milrinone  XC= 131 min CPB= 193 min  [name], MD [facility] [phone]

## 2014-07-18 NOTE — H&P (View-Only) (Signed)
[address]       [place] [address]             [phone]      PCP is [name], MD Referring Provider is [name], MD [name], MD  Chief Complaint  Patient presents with  . Coronary Artery Disease    Surgical eval for possible CABG, cardiac cath 06/28/14, 2D Echo 05/16/14    HPI: 44 year old woman sent for consultation regarding coronary artery disease and mitral insufficiency.  [name] is a 44 year old woman with a complex medical history. She's had type 1 diabetes since age 87. This was complicated by renal failure for which she is on peritoneal dialysis. 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 She did not take her antirejection medications regularly due to cost. Both the kidney and pancreas grafts rejected and failed. She's been back on dialysis since June 2014. She had an AV graft placed at one time, but was never able to use it. She currently is on peritoneal dialysis.  She had a stress test as part of a workup for possible retransplantation at [facility]. It showed evidence of an inferior scar without ischemia. Her ejection fraction was 50%.  She was ill around the holidays this past December. She was having problems with chest discomfort and shortness of breath. She was diagnosed with pneumonia. She says that for "a couple of days" she had severe chest pressure that felt like an elephant standing on her chest. She thought this was due to the pneumonia. She was having trouble with peritoneal dialysis around that time as well. She eventually went to the doctor and was found to have a friction rub consistent with pericarditis. An echocardiogram was done which showed an ejection fraction of 35-40% with hypokinesis of the septal and inferior walls. There was mild mitral insufficiency and moderate to severe tricuspid insufficiency. She was referred to Dr. Sallyanne Kuster.  An electrocardiogram showed Q waves in the septal  and inferior leads and prominent anterior T-wave inversion. Those findings were new from May 2015.  She had cardiac catheterization yesterday. It revealed severe three-vessel disease including a tight proximal LAD lesion. There was moderate to severe MR on the left ventriculogram.  She has continued to work with the exception of a couple days around the holidays. She has not had any chest pain, pressure, or tightness since the event that Christmas. She says that she does get short of breath with exertion. She primarily notices this when she is walking up stairs or an incline. She has occasional leg swelling, but it is mild. She does complain of decreased energy.   Past Medical History  Diagnosis Date  . H/O kidney transplant   . Pancreas transplanted   . Diabetes mellitus without complication   . Hypertension   . Renal disorder   . Anemia   . GERD (gastroesophageal reflux disease)   . End stage renal disease   . Hyperlipidemia   . Cardiomyopathy, ischemic 06/24/2014    Past Surgical History  Procedure Laterality Date  . 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  Pancreas left mid abdomen  . Cesarean section    . Laparoscopic insertion peritoneal catheter  07/18/2008    Dr Excell Seltzer  . Laparoscopic repositioning capd catheter  2020-08-1308    Dr Excell Seltzer  . Laparoscopic repositioning capd catheter  11/08/2008    Dr Excell Seltzer  . Omentectomy  11/08/2008  Dr Excell Seltzer  . Laparoscopic repositioning capd catheter  10/03/2009    Unplugging CAPD catheter Dr Excell Seltzer  . Insertion of dialysis catheter  September 28, 2012    Right upper chest  . Capd insertion N/A 09/08/2013    Procedure: Laparoscopic CAPD peritoneal dialysis catheter placement, possible lysis of adhesions   ;  Surgeon: Adin Hector, MD;  Location: [place];  Service: General;  Laterality: N/A;  . Left heart catheterization with coronary angiogram N/A 06/28/2014    Procedure: LEFT HEART CATHETERIZATION WITH  CORONARY ANGIOGRAM;  Surgeon: [name], MD;  Location: [place] CATH [facility];  Service: Cardiovascular;  Laterality: N/A;    Family History  Problem Relation Age of Onset  . CAD Mother   . Stroke Mother   . COPD Father     Social History History  Substance Use Topics  . Smoking status: Never Smoker   . Smokeless tobacco: Never Used  . Alcohol Use: No    Current Outpatient Prescriptions  Medication Sig Dispense Refill  . acetaminophen (TYLENOL) 325 MG tablet Take 650 mg by mouth every 4 (four) hours as needed for mild pain.    Marland Kitchen aspirin 325 MG tablet Take 325 mg by mouth daily.     . calcium acetate (PHOSLO) 667 MG capsule Take 2,001 mg by mouth 3 (three) times daily with meals.    . carvedilol (COREG) 12.5 MG tablet Take 1 tablet (12.5 mg total) by mouth 2 (two) times daily. 180 tablet 3  . gabapentin (NEURONTIN) 100 MG capsule Take 100 mg by mouth daily.    . insulin glargine (LANTUS) 100 UNIT/ML injection Inject 10 Units into the skin at bedtime.     . insulin lispro (HUMALOG) 100 UNIT/ML injection Inject 10-15 Units into the skin 3 (three) times daily before meals. Per sliding scale    . losartan (COZAAR) 100 MG tablet Take 100 mg by mouth daily.    . mirtazapine (REMERON) 15 MG tablet Take 15 mg by mouth at bedtime.    Marland Kitchen omeprazole (PRILOSEC) 20 MG capsule Take 20 mg by mouth daily as needed.     . simvastatin (ZOCOR) 5 MG tablet Take 5 mg by mouth at bedtime.      No current facility-administered medications for this visit.    Allergies  Allergen Reactions  . Pork-Derived Products Other (See Comments)    Pt is [place]    Review of Systems  Constitutional: Positive for fatigue. Negative for activity change and unexpected weight change.  HENT: Negative for dental problem.   Respiratory: Positive for shortness of breath (with exertion).   Cardiovascular: Positive for chest pain (episode in late December) and leg swelling (occassional mild).       No orthopnea or PND    Endocrine:       DM- difficult to control  Neurological: Negative.   Hematological: Negative for adenopathy. Does not bruise/bleed easily.  All other systems reviewed and are negative.   BP 124/82 mmHg  Pulse 74  Resp 20  Ht 5' (1.524 m)  Wt 123 lb (55.792 kg)  BMI 24.02 kg/m2  SpO2 95% Physical Exam  Constitutional: She is oriented to person, place, and time. She appears well-developed and well-nourished. No distress.  HENT:  Head: Normocephalic and atraumatic.  Eyes: EOM are normal. Pupils are equal, round, and reactive to light.  Neck: Neck supple. No JVD present. No thyromegaly present.  Cardiovascular: Normal rate and regular rhythm.  Exam reveals gallop (+S4). Exam reveals no friction  rub.   Murmur (diastolic at RUSB, systolic LLSB) heard. Pulmonary/Chest: Effort normal and breath sounds normal. She has no wheezes. She has no rales.  Abdominal: Soft. There is no tenderness.  PD catheter  Musculoskeletal: She exhibits no edema.  Lymphadenopathy:    She has no cervical adenopathy.  Neurological: She is alert and oriented to person, place, and time.  Vitals reviewed.    Diagnostic Tests: Echocardiogram 05/06/2014 Study Conclusions  - Left ventricle: Septal and mid and basal inferior wall hypokinesis. The cavity size was moderately dilated. Wall thickness was normal. Systolic function was moderately reduced. The estimated ejection fraction was in the range of 35% to 40%. - Aortic valve: There was trivial regurgitation. - Mitral valve: Calcified annulus. There was mild regurgitation. - Atrial septum: No defect or patent foramen ovale was identified. - Tricuspid valve: There was moderate-severe regurgitation.  Cardiac catheterization Angiographic Findings:  1. The left main coronary artery appears calcified and has very mild ostial disease, but is free of significant stenoses and bifurcates in the usual fashion into the left anterior descending artery and left  circumflex coronary artery.  2. The left anterior descending artery is a large-territory, but small caliber vessel that reaches the apex and generates three major diagonal branches. There is evidence of extensive and diffuse luminal irregularities and moderate calcification. The most important stenosis is a 95% proximal lesion. There is a 50-60% mid vessel stenosis and a 70-80% distal stebnosis. 3. The left circumflex coronary artery is a medium-size vessel non dominant vessel that generates two major oblique marginal arteries. There is evidence of extensive luminal irregularities and moderate calcification. Beyond the first OM (which is the larger branch) there is a 90% stenosis. The second OM is diffusely diseased. 4. The right coronary artery is a small-size dominant vessel that is diffusely diseased, calcified and subtotally occluded before it generates a posterior lateral ventricular system as well as the PDA. These can be seen filling via bridging collaterals and RV marginal collaterals. 5. The left ventricle is normal in size. The left ventricle systolic function is moderately-to-severely decreased with an estimated ejection fraction of 30-35%. Regional wall motion abnormalities are seen. The Mid-basal inferior wall is akinetic. The mid anterior wall is hypokinetic. The apex appears mildly dyskinetic. No left ventricular thrombus is seen. There is moderate to severe mitral insufficiency. The ascending aorta appears normal. There is no aortic valve stenosis by pullback. The left ventricular end-diastolic pressure is 23 mm Hg.    IMPRESSIONS:   Severe multivessel CAD with severe ischemic cardiomyopathy and moderate to severe secondary mitral insufficiency. Small caliber vessels with diffuse "diabetic" disease. Questionable surgical targets  RECOMMENDATION:   Consider surgical revascularization with LIMA to LAD and mitral valve repair. Not sure if the other stenotic vessels are graftable. May  need to consider viability study (24 hour Thallium rest redistribution) to assess the anterior wall. She is already on near maximal medical therapy. CHF is well compensated on peritoneal dialysis. If surgery is not an option, consider PCI of proximal LAD artery.         Impression: 44 year old woman with a complex medical history including long-standing brittle type 1 diabetes with end-stage renal disease on peritoneal dialysis. She also has a history of previous kidney and pancreas transplants, both of which have rejected and failed. She was ill around Christmas with pneumonia, and from her history it sounds like she likely had an out of hospital MI. She developed pericarditis. She has been found to have ischemic  cardiomyopathy.  I personally reviewed the echocardiogram and catheterization images base my opinion on my interpretation.  By catheterization she has three-vessel coronary disease, including a tight proximal LAD stenosis. Her circumflex disease is rather minor and comes after the takeoff of a large obtuse marginal that is not affected. Her right coronary disease is severe. Her targets do not appear appropriate for bypass on catheterization, but we will check those intraoperatively to see if there is an acceptable target vessel for grafting.  In regards to her valvular disease. She had moderate to severe tricuspid regurgitation on echo, but does not have any signs or symptoms of right heart failure on exam currently. At catheterization she had moderate to severe mitral insufficiency. On her echo in January there was only mild mitral insufficiency. It is unclear if this worsened in the interim or if there could be a component of catheter induced MR at catheterization. She will need an intraoperative transesophageal echocardiogram to further clarify these issues, and may need one or both valves repaired at the time of surgery.   My recommendation to [name] was that we proceed  with coronary artery bypass grafting and possible mitral repair. We will definitely plan to do a left mammary to LAD graft. We will assess her right coronary runoff to see if there is any suitable target vessel for grafting, although this does not appear to be the case on catheterization. We will plan to do an intraoperative transesophageal echocardiogram to assess the mitral and tricuspid valves to see if repair is indicated.  I discussed the general nature of the procedure, the use of cardiopulmonary bypass, the need for general anesthesia, and the incisions to be used with the patient and her family. We discussed the expected hospital stay, overall recovery and short and long term outcomes. I reviewed the indications, risks, benefits, and alternatives. She understands the risks include, but are not limited to death, stroke, MI, DVT/PE, bleeding, possible need for transfusion, infections, cardiac arrhythmias, heart block requiring pacemaker placement, other organ system dysfunction including respiratory or GI complications, as well as the possibility of unforeseeable complications. She does understand that she is at elevated risk due to her brittle diabetes and renal failure. I do think both of those issues are manageable.   She accepts the risks and wishes to proceed. She wishes to wait until March 21 to allow her to take care of some issues at work.  Plan:  Coronary artery bypass grafting, possible mitral repair on Monday 07/18/2014  Revonda Standard. Roxan Hockey, MD [facility] [phone]

## 2014-07-19 ENCOUNTER — Encounter (HOSPITAL_COMMUNITY): Payer: Self-pay | Admitting: Thoracic Surgery (Cardiothoracic Vascular Surgery)

## 2014-07-19 ENCOUNTER — Inpatient Hospital Stay (HOSPITAL_COMMUNITY): Payer: Medicare Other

## 2014-07-19 LAB — GLUCOSE, CAPILLARY
GLUCOSE-CAPILLARY: 180 mg/dL — AB (ref 70–99)
GLUCOSE-CAPILLARY: 155 mg/dL — AB (ref 70–99)
GLUCOSE-CAPILLARY: 155 mg/dL — AB (ref 70–99)
GLUCOSE-CAPILLARY: 138 mg/dL — AB (ref 70–99)
GLUCOSE-CAPILLARY: 69 mg/dL — AB (ref 70–99)
GLUCOSE-CAPILLARY: 126 mg/dL — AB (ref 70–99)
GLUCOSE-CAPILLARY: 119 mg/dL — AB (ref 70–99)
Glucose-Capillary: 106 mg/dL — ABNORMAL HIGH (ref 70–99)
Glucose-Capillary: 106 mg/dL — ABNORMAL HIGH (ref 70–99)
Glucose-Capillary: 126 mg/dL — ABNORMAL HIGH (ref 70–99)
Glucose-Capillary: 140 mg/dL — ABNORMAL HIGH (ref 70–99)
Glucose-Capillary: 142 mg/dL — ABNORMAL HIGH (ref 70–99)
Glucose-Capillary: 149 mg/dL — ABNORMAL HIGH (ref 70–99)
Glucose-Capillary: 155 mg/dL — ABNORMAL HIGH (ref 70–99)
Glucose-Capillary: 157 mg/dL — ABNORMAL HIGH (ref 70–99)
Glucose-Capillary: 166 mg/dL — ABNORMAL HIGH (ref 70–99)
Glucose-Capillary: 49 mg/dL — ABNORMAL LOW (ref 70–99)
Glucose-Capillary: 79 mg/dL (ref 70–99)
Glucose-Capillary: 84 mg/dL (ref 70–99)
Glucose-Capillary: 89 mg/dL (ref 70–99)

## 2014-07-19 LAB — POCT I-STAT 3, ART BLOOD GAS (G3+)
Acid-base deficit: 7 mmol/L — ABNORMAL HIGH (ref 0.0–2.0)
Acid-base deficit: 8 mmol/L — ABNORMAL HIGH (ref 0.0–2.0)
BICARBONATE: 18.9 meq/L — AB (ref 20.0–24.0)
Bicarbonate: 17.3 mEq/L — ABNORMAL LOW (ref 20.0–24.0)
O2 Saturation: 99 %
O2 Saturation: 95 %
PCO2 ART: 29.1 mmHg — AB (ref 35.0–45.0)
PO2 ART: 83 mmHg (ref 80.0–100.0)
Patient temperature: 36.6
TCO2: 18 mmol/L (ref 0–100)
TCO2: 20 mmol/L (ref 0–100)
pCO2 arterial: 41 mmHg (ref 35.0–45.0)
pH, Arterial: 7.38 (ref 7.350–7.450)
pH, Arterial: 7.269 — ABNORMAL LOW (ref 7.350–7.450)
pO2, Arterial: 147 mmHg — ABNORMAL HIGH (ref 80.0–100.0)

## 2014-07-19 LAB — CBC
HCT: 31.3 % — ABNORMAL LOW (ref 36.0–46.0)
HEMATOCRIT: 31.5 % — AB (ref 36.0–46.0)
HEMOGLOBIN: 10.6 g/dL — AB (ref 12.0–15.0)
Hemoglobin: 10.2 g/dL — ABNORMAL LOW (ref 12.0–15.0)
MCH: 24.8 pg — AB (ref 26.0–34.0)
MCH: 25.7 pg — ABNORMAL LOW (ref 26.0–34.0)
MCHC: 32.6 g/dL (ref 30.0–36.0)
MCHC: 33.7 g/dL (ref 30.0–36.0)
MCV: 76.2 fL — ABNORMAL LOW (ref 78.0–100.0)
MCV: 76.5 fL — ABNORMAL LOW (ref 78.0–100.0)
Platelets: 120 10*3/uL — ABNORMAL LOW (ref 150–400)
Platelets: 114 10*3/uL — ABNORMAL LOW (ref 150–400)
RBC: 4.11 MIL/uL (ref 3.87–5.11)
RBC: 4.12 MIL/uL (ref 3.87–5.11)
RDW: 16.2 % — ABNORMAL HIGH (ref 11.5–15.5)
RDW: 16.6 % — AB (ref 11.5–15.5)
WBC: 9.1 10*3/uL (ref 4.0–10.5)
WBC: 10.7 10*3/uL — AB (ref 4.0–10.5)

## 2014-07-19 LAB — POCT I-STAT, CHEM 8
BUN: 38 mg/dL — AB (ref 6–23)
BUN: 42 mg/dL — ABNORMAL HIGH (ref 6–23)
CHLORIDE: 100 mmol/L (ref 96–112)
CREATININE: 11.2 mg/dL — AB (ref 0.50–1.10)
Calcium, Ion: 0.87 mmol/L — ABNORMAL LOW (ref 1.12–1.23)
Calcium, Ion: 0.98 mmol/L — ABNORMAL LOW (ref 1.12–1.23)
Chloride: 106 mmol/L (ref 96–112)
Creatinine, Ser: 11.3 mg/dL — ABNORMAL HIGH (ref 0.50–1.10)
GLUCOSE: 168 mg/dL — AB (ref 70–99)
Glucose, Bld: 118 mg/dL — ABNORMAL HIGH (ref 70–99)
HCT: 33 % — ABNORMAL LOW (ref 36.0–46.0)
HCT: 34 % — ABNORMAL LOW (ref 36.0–46.0)
HEMOGLOBIN: 11.6 g/dL — AB (ref 12.0–15.0)
Hemoglobin: 11.2 g/dL — ABNORMAL LOW (ref 12.0–15.0)
Potassium: 3.6 mmol/L (ref 3.5–5.1)
Potassium: 4.1 mmol/L (ref 3.5–5.1)
Sodium: 138 mmol/L (ref 135–145)
Sodium: 135 mmol/L (ref 135–145)
TCO2: 16 mmol/L (ref 0–100)
TCO2: 17 mmol/L (ref 0–100)

## 2014-07-19 LAB — RENAL FUNCTION PANEL
Albumin: 2.9 g/dL — ABNORMAL LOW (ref 3.5–5.2)
Anion gap: 13 (ref 5–15)
BUN: 45 mg/dL — ABNORMAL HIGH (ref 6–23)
CALCIUM: 6.7 mg/dL — AB (ref 8.4–10.5)
CHLORIDE: 106 mmol/L (ref 96–112)
CO2: 22 mmol/L (ref 19–32)
Creatinine, Ser: 11.43 mg/dL — ABNORMAL HIGH (ref 0.50–1.10)
GFR calc non Af Amer: 4 mL/min — ABNORMAL LOW (ref >90)
GFR, EST AFRICAN AMERICAN: 4 mL/min — AB (ref >90)
Glucose, Bld: 79 mg/dL (ref 70–99)
Phosphorus: 6 mg/dL — ABNORMAL HIGH (ref 2.3–4.6)
Potassium: 3.6 mmol/L (ref 3.5–5.1)
SODIUM: 141 mmol/L (ref 135–145)

## 2014-07-19 LAB — CREATININE, SERUM
Creatinine, Ser: 11.56 mg/dL — ABNORMAL HIGH (ref 0.50–1.10)
GFR calc non Af Amer: 4 mL/min — ABNORMAL LOW (ref >90)
GFR, EST AFRICAN AMERICAN: 4 mL/min — AB (ref >90)

## 2014-07-19 LAB — MAGNESIUM
Magnesium: 1.6 mg/dL (ref 1.5–2.5)
Magnesium: 1.7 mg/dL (ref 1.5–2.5)

## 2014-07-19 MED ORDER — INSULIN ASPART 100 UNIT/ML ~~LOC~~ SOLN: 0.0000 [IU] | SUBCUTANEOUS | Status: DC

## 2014-07-19 MED ORDER — MIRTAZAPINE 15 MG PO TABS
15.0000 mg | ORAL_TABLET | Freq: Every day | ORAL | Status: DC
  Administered 2014-07-19 – 2014-07-24 (×4): 15 mg via ORAL
  Filled 2014-07-19 (×7): qty 1

## 2014-07-19 MED ORDER — INSULIN ASPART 100 UNIT/ML ~~LOC~~ SOLN
0.0000 [IU] | SUBCUTANEOUS | Status: DC
  Administered 2014-07-19 – 2014-07-20 (×3): 2 [IU] via SUBCUTANEOUS

## 2014-07-19 MED ORDER — METOCLOPRAMIDE HCL 5 MG/ML IJ SOLN
10.0000 mg | Freq: Three times a day (TID) | INTRAMUSCULAR | Status: AC
  Administered 2014-07-19 – 2014-07-20 (×2): 10 mg via INTRAVENOUS
  Filled 2014-07-19 (×3): qty 2

## 2014-07-19 MED ORDER — PANTOPRAZOLE SODIUM 40 MG PO TBEC
40.0000 mg | DELAYED_RELEASE_TABLET | Freq: Every day | ORAL | Status: DC
  Administered 2014-07-19 – 2014-07-25 (×7): 40 mg via ORAL
  Filled 2014-07-19 (×7): qty 1

## 2014-07-19 MED ORDER — CALCIUM ACETATE (PHOS BINDER) 667 MG PO CAPS
2001.0000 mg | ORAL_CAPSULE | Freq: Three times a day (TID) | ORAL | Status: DC
  Administered 2014-07-19 – 2014-07-22 (×8): 2001 mg via ORAL
  Administered 2014-07-23: 667 mg via ORAL
  Administered 2014-07-23: 1334 mg via ORAL
  Administered 2014-07-24 – 2014-07-25 (×4): 2001 mg via ORAL
  Filled 2014-07-19 (×23): qty 3

## 2014-07-19 MED ORDER — CALCIUM ACETATE 667 MG PO CAPS
2001.0000 mg | ORAL_CAPSULE | Freq: Three times a day (TID) | ORAL | Status: DC
  Administered 2014-07-19: 2001 mg via ORAL

## 2014-07-19 MED ORDER — HEPARIN SODIUM (PORCINE) 1000 UNIT/ML IJ SOLN: 500.0000 [IU] | INTRAMUSCULAR | Status: DC | PRN

## 2014-07-19 MED ORDER — INSULIN DETEMIR 100 UNIT/ML ~~LOC~~ SOLN
10.0000 [IU] | Freq: Once | SUBCUTANEOUS | Status: AC
  Administered 2014-07-19: 10 [IU] via SUBCUTANEOUS
  Filled 2014-07-19: qty 0.1

## 2014-07-19 MED ORDER — CETYLPYRIDINIUM CHLORIDE 0.05 % MT LIQD
7.0000 mL | Freq: Two times a day (BID) | OROMUCOSAL | Status: DC
  Administered 2014-07-19 – 2014-07-22 (×7): 7 mL via OROMUCOSAL

## 2014-07-19 MED ORDER — DEXTROSE 5 % IV SOLN
0.0000 ug/min | INTRAVENOUS | Status: DC
  Administered 2014-07-20: 40 ug/min via INTRAVENOUS
  Administered 2014-07-20: 25 ug/min via INTRAVENOUS
  Administered 2014-07-20: 20 ug/min via INTRAVENOUS
  Filled 2014-07-19 (×4): qty 2

## 2014-07-19 MED ORDER — DELFLEX-LC/1.5% DEXTROSE 346 MOSM/L IP SOLN: INTRAPERITONEAL | Status: DC

## 2014-07-19 MED ORDER — SODIUM BICARBONATE 8.4 % IV SOLN
100.0000 meq | Freq: Once | INTRAVENOUS | Status: AC
  Administered 2014-07-19: 100 meq via INTRAVENOUS
  Filled 2014-07-19: qty 100

## 2014-07-19 MED ORDER — CARVEDILOL 12.5 MG PO TABS
12.5000 mg | ORAL_TABLET | Freq: Two times a day (BID) | ORAL | Status: DC
  Filled 2014-07-19 (×5): qty 1

## 2014-07-19 MED ORDER — DELFLEX-LC/2.5% DEXTROSE 394 MOSM/L IP SOLN: INTRAPERITONEAL | Status: DC

## 2014-07-19 MED ORDER — DEXTROSE 50 % IV SOLN
INTRAVENOUS | Status: AC
  Administered 2014-07-19: 25 mL
  Filled 2014-07-19: qty 50

## 2014-07-19 MED ORDER — INSULIN ASPART 100 UNIT/ML ~~LOC~~ SOLN
3.0000 [IU] | Freq: Three times a day (TID) | SUBCUTANEOUS | Status: DC
  Administered 2014-07-19: 3 [IU] via SUBCUTANEOUS

## 2014-07-19 MED ORDER — INSULIN DETEMIR 100 UNIT/ML ~~LOC~~ SOLN
10.0000 [IU] | Freq: Every day | SUBCUTANEOUS | Status: DC
  Administered 2014-07-20: 10 [IU] via SUBCUTANEOUS
  Filled 2014-07-19 (×2): qty 0.1

## 2014-07-19 MED FILL — Magnesium Sulfate Inj 50%: INTRAMUSCULAR | Qty: 10 | Status: AC

## 2014-07-19 MED FILL — Heparin Sodium (Porcine) Inj 1000 Unit/ML: INTRAMUSCULAR | Qty: 30 | Status: AC

## 2014-07-19 MED FILL — Potassium Chloride Inj 2 mEq/ML: INTRAVENOUS | Qty: 40 | Status: AC

## 2014-07-19 NOTE — Op Note (Signed)
NAMEROHA, POND    ACCOUNT NO.:  [id number]  MEDICAL RECORD NO.:  [id number]  LOCATION:  2S03C                        FACILITY:  [facility]  PHYSICIAN:  Revonda Standard. Roxan Hockey, M.D.DATE OF BIRTH:  [birth year]  DATE OF PROCEDURE:  07/18/2014 DATE OF DISCHARGE:                              OPERATIVE REPORT   PREOPERATIVE DIAGNOSIS:  Three-vessel coronary disease with severe mitral regurgitation.  POSTOPERATIVE DIAGNOSIS:  Severe coronary artery disease with moderate to severe mitral regurgitation and moderate tricuspid regurgitation.  PROCEDURE:  Median sternotomy, extracorporeal circulation,  Coronary artery bypass grafting x 2  Left internal mammary artery to LAD,  Saphenous vein graft to posterior descending Mitral valve repair with 28 mm Sorin Memo 3D annuloplasty ring(ref # [id number] serial # [id number]) Endoscopic vein harvest right thigh.  SURGEON:  Melrose Nakayama, M.D.  ASSISTANT:  John Giovanni, PA-C.  ANESTHESIA:  General.  FINDINGS:  Good quality conduits. Diffusely diseased target vessels. Transesophageal echocardiography revealed central mitral regurgitation, moderate to moderately severe (2 to 3+) posterior annular dilatation, moderate tricuspid regurgitation, ejection fraction 30% with septal hypokinesis.  Postbypass transesophageal echocardiography showed no residual mitral regurgitation.  CLINICAL NOTE:  Ms. Hubley is a 44 year old woman who has a complex medical history including type 1 diabetes complicated by renal failure requiring peritoneal dialysis.  She also has a history of failed cadaveric renal and pancreas transplant.  She recently had a stress test as a part of her workup for possible re-transplantation. It showed inferior scar without ischemia.  She was ill with pericarditis earlier this year.  An echocardiogram showed mild mitral insufficiency and moderate to severe tricuspid regurgitation.  She underwent cardiac catheterization  on June 28, 2014, which showed severe three-vessel disease including a tight proximal LAD lesion.  There was moderate to severe mitral regurgitation on the left ventriculogram.  She was advised to undergo coronary bypass grafting and possible mitral valve repair and also possible tricuspid valve repair.  The indications, risks, benefits, alternatives, and intraoperative decision making were discussed in detail with the patient.  She understood and accepted the risks of surgery and agreed to proceed.  OPERATIVE NOTE:  Ms. Beem was brought to the preoperative holding area on July 18, 2014. Anesthesia placed a Swan-Ganz catheter and arterial blood pressure monitoring line.  She was taken to the operating room, anesthetized, and intubated.  Intravenous antibiotics were administered.  Transesophageal echocardiography was performed.  Please refer to Dr. Trude Mcburney separately dictated note for details.  There was septal hypokinesis and mild global hypokinesis with ejection fraction of around 30%-35%.  There was 2 to 3+ mitral regurgitation and 2+ tricuspid regurgitation.    The chest, abdomen, and legs were prepped and draped in the usual sterile fashion.  Median sternotomy was performed.  Initial hemostasis was achieved.  The sternal retractor was placed and the pericardium was opened.  The inferior wall was inspected and there did appear to be a graftable posterior descending target vessel.  The sternal retractor was removed. The Rultract retractor was placed, and the left internal mammary artery was harvested using standard technique.  Simultaneously, an incision was made in the medial aspect of the right thigh and a small segment of the greater saphenous vein was harvested from the upper thigh.  Both the mammary and the saphenous vein were of good quality.  2000 units of heparin was administered during the vessel harvest.  The remainder of the full heparin dose was given prior  to cannulating the aorta.  After harvesting the conduits, the sternal retractor was replaced. The ascending aorta was inspected.  There was no evidence of atherosclerotic disease.  After confirming adequate anticoagulation with ACT measurement, the aorta was cannulated via concentric 2-0 Ethibond pledgeted pursestring sutures.  A 28-French metal tip right-angle venous cannula was placed via pursestring suture in the superior vena cava. Cardiopulmonary bypass was initiated, and the patient was cooled to 28 degrees Celsius.  A 32-French malleable venous cannula was placed via a pursestring suture in the inferior aspect of the right atrium and directed into the vena cava.  Caval tapes were placed, but were only tightened during the open portion of the procedure.  Carbon dioxide was insufflated into the operative field.  The coronary arteries were inspected and anastomotic sites were chosen.  A retrograde cardioplegic cannula was placed via pursestring suture in the right atrium and directed in the coronary sinus.  An antegrade cardioplegic cannula was placed in the ascending aorta.  A temperature probe was placed in myocardial septum and a foam pad was placed in the pericardium to insulate the heart.  The aorta was crossclamped.  The left ventricle was emptied via the aortic root vent.  Cardiac arrest then was achieved with a combination of cold antegrade blood cardioplegia, and topical iced saline.  An initial 500 mL of cardioplegia was administered antegrade.  There was a rapid diastolic arrest.  Additional 500 mL was administered retrograde. There was septal cooling to 10 degrees Celsius.   A reversed saphenous vein graft was placed end-to-side to the posterior descending branch of the right coronary.  This was a 1.5 mm diffusely diseased vessel.  Only a 1 mm probe would pass distally in the vessel.  The vein was of good quality.  It was anastomosed end-to-side with a running 7-0  Prolene suture.  A probe passed easily through the anastomosis distally.  At the completion of the anastomosis, cardioplegia was administered down the graft.  There was good flow and good hemostasis.  Additional cardioplegia also was administered via the retrograde cannula.  Next, the heart was elevated.  The left mammary was brought through a window in the pericardium.  The distal end was beveled.  It was anastomosed end-to-side to the distal LAD.  The LAD was also a diffusely diseased vessel.  It did accept a 1.5 mm probe proximally and distally for a short distance.  A 1 mm probe did go all the way to the apex.  The end- to-side anastomosis was performed with a running 8-0 Prolene suture.  At the completion of the anastomosis, the bulldog clamp was briefly removed to inspect for hemostasis.  Septal rewarming was noted.  The bulldog clamp was replaced and the mammary pedicle was tacked to the epicardial surface of the heart with 6-0 Prolene sutures.  Additional cardioplegia was administered via the retrograde cannula and during this, the retrograde cannula became dislodged. Attempts to get the retrograde cannula back in place were not successful.  A small right atriotomy was made, and the catheter was eventually able to be placed back under direct vision and then advanced in the coronary sinus.  While the right atrium was opened, a small patent foramen ovale was closed with a 4-0 Prolene suture.  The right atriotomy then was  closed. Additional cardioplegia was administered retrograde. After achieving adequate septal cooling again, a left atriotomy was performed. Mitral retractor was placed.  The mitral valve was inspected.  The leaflets were both intact with no prolapsing segments.  There was posterior annular dilatation.  The anterior leaflet sized for a 28 mm Memo 3D annuloplasty ring.  2-0 Ethibond horizontal mattress sutures were placed in the anulus circumferentially.  These were  then placed through the sewing cuff of the annuloplasty ring, which was lowered into place.  The sutures were sequentially tied.  The left ventricle was distended with iced saline. There was a small fenestration in the posterior leaflet that allowed a trivial leak.  Overall, the valve was competent holding a good pressure.  Additional cardioplegia was administered at 20-minute intervals during the open portion of the procedure.  Systemic rewarming was begun.  The left atriotomy was closed in 2 layers with running 4-0 Prolene suture.  The 1st was a running horizontal mattress suture followed by a running simple suture.  Tennis Must- airing was performed before tying the first layer.  After closing the left atriotomy, the vein graft was cut to length.  The cardioplegic cannula was removed from the ascending aorta.  The proximal vein graft anastomosis was performed to a 4.5 mm punch aortotomy with a running 6-0 Prolene suture.  At the completion of this anastomosis, the patient was placed in Trendelenburg position.  A warm dose of cardioplegia was administered.  Lidocaine was administered.  The bulldog clamp was removed from the left mammary artery.  Additional de-airing was performed.  The aortic crossclamp was removed.  Total crossclamp time was 131 minutes.  The patient did not fibrillate, but was in heart block.  The superior vena cava cannula was redirected in the right atrium.  The inferior vena cava cannula was removed, as was the retrograde cardioplegic cannula.  The proximal and distal anastomoses and atriotomies were inspected for hemostasis.  Epicardial pacing wires were placed on the right ventricle and right atrium and DDD pacing was initiated at 80 beats per minute.  When the patient reached 37 degrees Celsius, a trial wean was performed to assess the valve.  The valve was competent, but the heart was hypokinetic.  The patient was placed back on full bypass.  A dopamine infusion was  initiated at 3 mcg/kg per minute, and the patient was loaded with milrinone and then an infusion was begun at  0.3 mcg/kg/minute.  The patient then weaned from cardiopulmonary bypass without difficulty.  Total bypass time was 193 minutes.  The initial cardiac index was 2 liters/minute/m2.  The patient remained hemodynamically stable throughout the post bypass period.  A test dose of protamine was administered.  There was some transient hypotension, which responded to volume administration.  The remainder of the protamine was administered without incident.  Superior vena caval and aortic cannulae were removed without incident.  The chest was irrigated with warm saline.  Hemostasis was achieved.  The pericardium was reapproximated with interrupted 3-0 silk sutures.  It came together easily without tension or kinking of the underlying grafts.  The left pleural and mediastinal chest tubes were placed via separate subcostal incisions.  The sternum was closed with a combination of single and double heavy gauge stainless steel wires.  The pectoralis fascia, subcutaneous tissue, and skin were closed in standard fashion. All sponge, needle, and instrument counts were correct at end of the procedure. The patient was transported from the operating room to the surgical intensive  care unit in good condition.     Revonda Standard Roxan Hockey, M.D.     SCH/MEDQ  D:  07/18/2014  T:  07/19/2014  Job:  [id number]

## 2014-07-19 NOTE — Progress Notes (Signed)
Hypoglycemic Event  CBG: 69  Treatment: 15 GM carbohydrate snack  Symptoms: None  Follow-up CBG: Time:1230 CBG Result:49  Possible Reasons for Event: inadequate meal intake  Comments/MD notified:protocol followed    [name], Donovan Gatchel A  Remember to initiate Hypoglycemia Order Set & complete

## 2014-07-19 NOTE — Progress Notes (Signed)
Hypoglycemic Event  CBG: 49  Treatment: D50 IV 25 mL  Symptoms: None  Follow-up CBG: Time:1405 CBG Result:126  Possible Reasons for Event: Inadequate meal intake  Comments/MD notified: protocol followed    [name], Priscilla Solorio A  Remember to initiate Hypoglycemia Order Set & complete

## 2014-07-19 NOTE — Procedures (Signed)
Extubation Procedure Note  Patient Details:   Name: Priscilla [name] DOB: 12/06/1970 MRN: [id number]   Airway Documentation:  Airway 8 mm (Active)  Secured at (cm) 21 cm 07/19/2014 12:41 AM  Measured From Lips 07/19/2014 12:41 AM  Secured Location Right 07/19/2014 12:41 AM  Secured By Pink Tape 07/19/2014 12:41 AM  Site Condition Dry 07/19/2014 12:41 AM    Evaluation  O2 sats: stable throughout Complications: No apparent complications Patient did tolerate procedure well. Bilateral Breath Sounds: Rhonchi Suctioning: Oral, Airway Yes  Chriss Driver [facility] 07/19/2014, 1:56 AM

## 2014-07-19 NOTE — Progress Notes (Addendum)
TCTS DAILY ICU PROGRESS NOTE                   [address]            [place] [address]          [phone]   1 Day Post-Op Procedure(s) (LRB): CORONARY ARTERY BYPASS GRAFTING (CABG)X2 LIMA-LAD; SVG-PD (N/A) MITRAL VALVE REPAIR (MVR) (N/A) TRANSESOPHAGEAL ECHOCARDIOGRAM (TEE) (N/A)  Total Length of Stay:  LOS: 1 day   Subjective: Extubated without problem, some soreness in chest, mild nausea. Cardiac Index good on current support  Objective: Vital signs in last 24 hours: Temp:  [96.4 F (35.8 C)-98.1 F (36.7 C)] 97.2 F (36.2 C) (03/22 0700) Pulse Rate:  [70-87] 87 (03/22 0700) Cardiac Rhythm:  [-] Atrial paced (03/22 0700) Resp:  [0-37] 14 (03/22 0700) BP: (83-148)/(48-90) 118/67 mmHg (03/22 0700) SpO2:  [97 %-100 %] 100 % (03/22 0700) Arterial Line BP: (76-183)/(40-104) 109/56 mmHg (03/22 0700) FiO2 (%):  [40 %-50 %] 40 % (03/22 0041)  Filed Weights   07/18/14 0549  Weight: 117 lb (53.071 kg)    Weight change:    Hemodynamic parameters for last 24 hours: PAP: (17-40)/(11-24) 30/12 mmHg CO:  [2 L/min-5.5 L/min] 4.3 L/min CI:  [1.4 L/min/m2-3.7 L/min/m2] 2.9 L/min/m2  Intake/Output from previous day: 03/21 0701 - 03/22 0700 In: 5231.5 [I.V.:3396.5; Blood:675; NG/GT:60; IV Piggyback:1100] Out: 1350 [Emesis/NG output:150; Blood:900; Chest Tube:300]  Intake/Output this shift:    Current Meds: Scheduled Meds: . acetaminophen  1,000 mg Oral 4 times per day   Or  . acetaminophen (TYLENOL) oral liquid 160 mg/5 mL  1,000 mg Per Tube 4 times per day  . antiseptic oral rinse  7 mL Mouth Rinse QID  . aspirin EC  325 mg Oral Daily   Or  . aspirin  324 mg Per Tube Daily  . bisacodyl  10 mg Oral Daily   Or  . bisacodyl  10 mg Rectal Daily  . cefUROXime (ZINACEF)  IV  1.5 g Intravenous Q12H  . chlorhexidine  15 mL Mouth Rinse BID  . docusate sodium  200 mg Oral Daily  . gabapentin  100 mg Oral Daily  . insulin aspart  0-24 Units Subcutaneous 6  times per day  . magnesium sulfate  4 g Intravenous Once  . metoprolol tartrate  12.5 mg Oral BID   Or  . metoprolol tartrate  12.5 mg Per Tube BID  . [START ON 07/20/2014] pantoprazole  40 mg Oral Daily  . simvastatin  5 mg Oral QHS  . sodium chloride  3 mL Intravenous Q12H   Continuous Infusions: . sodium chloride 10 mL/hr at 07/19/14 0700  . sodium chloride 250 mL (07/18/14 2326)  . sodium chloride 20 mL/hr at 07/19/14 0700  . dexmedetomidine Stopped (07/18/14 1830)  . DOPamine 3 mcg/kg/min (07/19/14 0700)  . lactated ringers Stopped (07/18/14 1518)  . lactated ringers Stopped (07/18/14 1518)  . milrinone 0.375 mcg/kg/min (07/19/14 0700)  . nitroGLYCERIN Stopped (07/18/14 1900)  . phenylephrine (NEO-SYNEPHRINE) Adult infusion 25 mcg/min (07/19/14 0700)   PRN Meds:.sodium chloride, metoprolol, morphine injection, ondansetron (ZOFRAN) IV, oxyCODONE, sodium chloride, traMADol  General appearance: alert, cooperative and no distress Neurologic: intact Heart: regular rate and rhythm and soft systolic murmur and + Rub with CT's in place Lungs: mildly dim in bases Abdomen: soft, nontender Extremities: trace edema Wound: dressings CDI  Lab Results: CBC: Recent Labs  07/18/14 2041 07/18/14 2121 07/19/14 0426  WBC 8.3  --  9.1  HGB 10.8* 11.2* 10.2*  HCT 31.9* 33.0* 31.3*  PLT 99*  --  120*   BMET:  Recent Labs  07/18/14 2121 07/19/14 0500  NA 138 141  K 3.6 3.6  CL 106 106  CO2  --  22  GLUCOSE 168* 79  BUN 38* 45*  CREATININE 11.30* 11.43*  CALCIUM  --  6.7*    PT/INR:  Recent Labs  07/18/14 1447  LABPROT 19.7*  INR 1.65*    ABG    Component Value Date/Time   PHART 7.269* 07/19/2014 0247   PCO2ART 41.0 07/19/2014 0247   PO2ART 83.0 07/19/2014 0247   HCO3 18.9* 07/19/2014 0247   TCO2 20 07/19/2014 0247   ACIDBASEDEF 8.0* 07/19/2014 0247   O2SAT 95.0 07/19/2014 0247    Radiology: Dg Chest Port 1 View  07/18/2014   CLINICAL DATA:  Status post  cardiac surgery.  EXAM: PORTABLE CHEST - 1 VIEW  COMPARISON:  07/14/2014  FINDINGS: The endotracheal tube is 2.2 cm above the carina. The right IJ Swan-Ganz catheter tip is in the proximal right pulmonary artery. The NG tube is in the stomach. External pacer wires are noted. Left-sided chest tube is in good position without pneumothorax.  The cardiac silhouette, mediastinal and hilar contours are stable. Mild vascular congestion and streaky areas of atelectasis. No pleural effusion or pneumothorax.  IMPRESSION: Postoperative support apparatus in good position without complicating features.  Mild vascular congestion and streaky areas of atelectasis.   Electronically Signed   By: Marijo Sanes M.D.   On: 07/18/2014 15:37     Assessment/Plan: S/P Procedure(s) (LRB): CORONARY ARTERY BYPASS GRAFTING (CABG)X2 LIMA-LAD; SVG-PD (N/A) MITRAL VALVE REPAIR (MVR) (N/A) TRANSESOPHAGEAL ECHOCARDIOGRAM (TEE) (N/A)  1 doing well on current support- milrinone, neo and dopamine, also apaced currently 2 nephrology assisting with dialysis management, some metabolic acidosis at last ABG 3 not much CT drainage, poss d/c tubes this am 4 no leukocytosis, H/H pretty stable , monitor thrombocytopenia- platelets currently 120K 5 routine pulm toilet for expected atelectasis 6 advance activity as able   GOLD,[name] 07/19/2014 7:34 AM  Patient seen and examined, agree with above  Cardiac index is stable will wean dopamine and milrinone  Dc chest tubes  Will avoid enoxaparin- will have heparin in PD fluid. SCD + mobilization  Levemir + SSI  Remo Lipps C. Roxan Hockey, MD [facility] [phone]

## 2014-07-19 NOTE — Progress Notes (Signed)
Utilization Review Completed.  

## 2014-07-19 NOTE — Progress Notes (Signed)
VC .80 NIF -30. Pt extubated to a 4 lt [place]. Sats remained 100% throughout .

## 2014-07-19 NOTE — Progress Notes (Signed)
TCTS BRIEF SICU PROGRESS NOTE  1 Day Post-Op  S/P Procedure(s) (LRB): CORONARY ARTERY BYPASS GRAFTING (CABG)X2 LIMA-LAD; SVG-PD (N/A) MITRAL VALVE REPAIR (MVR) (N/A) TRANSESOPHAGEAL ECHOCARDIOGRAM (TEE) (N/A)   Stable day AAI paced w/ stable BP O2 sats 99% on RA Labs okay w/ K+ 4.1  Plan: Continue current plan  Rexene Alberts 07/19/2014 7:59 PM

## 2014-07-19 NOTE — Progress Notes (Signed)
[facility] Progress Note   Subjective: UP in chair, no SOB. Wt's per pt this am was 63kg, awaiting reweigh.   Filed Vitals:   07/19/14 0815 07/19/14 0830 07/19/14 0900 07/19/14 1000  BP:  103/60 108/63 105/64  Pulse: 90 90 90 89  Temp: 96.8 F (36 C)     TempSrc:      Resp: 13 14 15 16   Weight:      SpO2: 99% 98% 100% 100%   Exam: Up in chair, extubated, alert +JVD Chest dec'd L base, R clear RRR no MRG Abd soft, NTND, LLQ PD cath RLE wrapped Trace bilat LE edema Neuro is alert, ox 3, nf  PD: CCPD  w a dinnertime 2L pause and four 2-liter exchanges overnight, no last fill.  Dwell time 1.5 hours.  Dry wt 50.5kg per clinic but pt says she is up to about 53 kg now. Uses mostly 2.5 % at home, K usually low.  Last alb 2.7, K 3.3, creat 14, BUN 45       Assessment: 1. S/p 2 vessel CABG/MV repair/closure of PFO 3/21 -off the vent, up walking this am 2. 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 Volume - is up ~10kg by wts, 3rd spaced , no resp compromise 4. Hypotension - on two pressors and milrinone for low CO 5. Secondary hyperparathyroidism - resume binders and calcitriol when taking po 6. Anemia - Hb on pre-op labs 3/17 was 13.5. Has had no recent requirement for ESA. Will continue to monitor inpt 7. Hypertension - not on issue right now   Plan- resume PD later tonight around dinnertime. Use cycler, mix of 1.5%/2.5% for mild-mod UF    Kelly Splinter MD  pager [phone]    cell [phone]  07/19/2014, 11:56 AM     Recent Labs Lab 07/14/14 1457  07/18/14 1326 07/18/14 1440 07/18/14 2041 07/18/14 2121 07/19/14 0500  NA 137  < > 136 139  --  138 141  K 3.1*  < > 4.1 3.7  --  3.6 3.6  CL 96  < > 102  --   --  106 106  CO2 21  --   --   --   --   --  22  GLUCOSE 135*  < > 158* 149*  --  168* 79  BUN 45*  < > 39*  --   --  38* 45*  CREATININE 13.81*  < > 10.70*  --  11.41* 11.30* 11.43*  CALCIUM 7.0*  --   --    --   --   --  6.7*  PHOS  --   --   --   --   --   --  6.0*  < > = values in this interval not displayed.  Recent Labs Lab 07/14/14 1457 07/19/14 0500  AST 16  --   ALT 15  --   ALKPHOS 99  --   BILITOT 1.1  --   PROT 6.2  --   ALBUMIN 2.6* 2.9*    Recent Labs Lab 07/18/14 1447 07/18/14 2041 07/18/14 2121 07/19/14 0426  WBC 11.3* 8.3  --  9.1  HGB 13.0 10.8* 11.2* 10.2*  HCT 39.9 31.9* 33.0* 31.3*  MCV 77.5* 75.8*  --  76.2*  PLT 81* 99*  --  120*   . acetaminophen  1,000 mg Oral 4 times per day   Or  . acetaminophen (TYLENOL) oral  liquid 160 mg/5 mL  1,000 mg Per Tube 4 times per day  . antiseptic oral rinse  7 mL Mouth Rinse BID  . aspirin EC  325 mg Oral Daily   Or  . aspirin  324 mg Per Tube Daily  . bisacodyl  10 mg Oral Daily   Or  . bisacodyl  10 mg Rectal Daily  . calcium acetate  2,001 mg Oral TID WC  . carvedilol  12.5 mg Oral BID WC  . cefUROXime (ZINACEF)  IV  1.5 g Intravenous Q12H  . chlorhexidine  15 mL Mouth Rinse BID  . docusate sodium  200 mg Oral Daily  . gabapentin  100 mg Oral Daily  . insulin aspart  0-24 Units Subcutaneous 6 times per day  . insulin aspart  0-24 Units Subcutaneous 6 times per day  . insulin aspart  3 Units Subcutaneous TID WC  . insulin detemir  10 Units Subcutaneous Once  . [START ON 07/20/2014] insulin detemir  10 Units Subcutaneous Daily  . magnesium sulfate  4 g Intravenous Once  . mirtazapine  15 mg Oral QHS  . [START ON 07/20/2014] pantoprazole  40 mg Oral Daily  . simvastatin  5 mg Oral QHS  . sodium chloride  3 mL Intravenous Q12H   . sodium chloride Stopped (07/19/14 0900)  . sodium chloride 250 mL (07/18/14 2326)  . sodium chloride 20 mL/hr at 07/19/14 0700  . dexmedetomidine Stopped (07/18/14 1830)  . DOPamine 3 mcg/kg/min (07/19/14 0700)  . lactated ringers Stopped (07/18/14 1518)  . lactated ringers Stopped (07/18/14 1518)  . milrinone 0.0175 mcg/kg/min (07/19/14 0800)  . nitroGLYCERIN Stopped (07/18/14  1900)  . phenylephrine (NEO-SYNEPHRINE) Adult infusion 15 mcg/min (07/19/14 0800)   sodium chloride, metoprolol, morphine injection, ondansetron (ZOFRAN) IV, oxyCODONE, sodium chloride, traMADol

## 2014-07-20 ENCOUNTER — Inpatient Hospital Stay (HOSPITAL_COMMUNITY): Payer: Medicare Other

## 2014-07-20 LAB — GLUCOSE, CAPILLARY
GLUCOSE-CAPILLARY: 68 mg/dL — AB (ref 70–99)
GLUCOSE-CAPILLARY: 150 mg/dL — AB (ref 70–99)
Glucose-Capillary: 141 mg/dL — ABNORMAL HIGH (ref 70–99)
Glucose-Capillary: 71 mg/dL (ref 70–99)
Glucose-Capillary: 32 mg/dL — CL (ref 70–99)
Glucose-Capillary: 33 mg/dL — CL (ref 70–99)
Glucose-Capillary: 158 mg/dL — ABNORMAL HIGH (ref 70–99)
Glucose-Capillary: 163 mg/dL — ABNORMAL HIGH (ref 70–99)

## 2014-07-20 LAB — CBC
HCT: 29 % — ABNORMAL LOW (ref 36.0–46.0)
HEMOGLOBIN: 9.7 g/dL — AB (ref 12.0–15.0)
MCH: 25.5 pg — AB (ref 26.0–34.0)
MCHC: 33.4 g/dL (ref 30.0–36.0)
MCV: 76.1 fL — ABNORMAL LOW (ref 78.0–100.0)
PLATELETS: 104 10*3/uL — AB (ref 150–400)
RBC: 3.81 MIL/uL — ABNORMAL LOW (ref 3.87–5.11)
RDW: 16.7 % — ABNORMAL HIGH (ref 11.5–15.5)
WBC: 8.8 10*3/uL (ref 4.0–10.5)

## 2014-07-20 LAB — BASIC METABOLIC PANEL
Anion gap: 12 (ref 5–15)
BUN: 43 mg/dL — ABNORMAL HIGH (ref 6–23)
CO2: 23 mmol/L (ref 19–32)
Calcium: 6.9 mg/dL — ABNORMAL LOW (ref 8.4–10.5)
Chloride: 100 mmol/L (ref 96–112)
Creatinine, Ser: 10.46 mg/dL — ABNORMAL HIGH (ref 0.50–1.10)
GFR calc Af Amer: 5 mL/min — ABNORMAL LOW (ref >90)
GFR calc non Af Amer: 4 mL/min — ABNORMAL LOW (ref >90)
Glucose, Bld: 133 mg/dL — ABNORMAL HIGH (ref 70–99)
Potassium: 3.5 mmol/L (ref 3.5–5.1)
Sodium: 135 mmol/L (ref 135–145)

## 2014-07-20 MED ORDER — DELFLEX-LC/2.5% DEXTROSE 394 MOSM/L IP SOLN
INTRAPERITONEAL | Status: DC
  Administered 2014-07-20: 10000 mL via INTRAPERITONEAL

## 2014-07-20 MED ORDER — CALCITRIOL 0.25 MCG PO CAPS
0.2500 ug | ORAL_CAPSULE | Freq: Every day | ORAL | Status: DC
  Administered 2014-07-20 – 2014-07-25 (×6): 0.25 ug via ORAL
  Filled 2014-07-20 (×6): qty 1

## 2014-07-20 MED ORDER — DEXTROSE 50 % IV SOLN
INTRAVENOUS | Status: AC
  Administered 2014-07-20: 50 mL
  Filled 2014-07-20: qty 50

## 2014-07-20 MED ORDER — INSULIN ASPART 100 UNIT/ML ~~LOC~~ SOLN
0.0000 [IU] | Freq: Three times a day (TID) | SUBCUTANEOUS | Status: DC
  Administered 2014-07-21 – 2014-07-22 (×3): 1 [IU] via SUBCUTANEOUS
  Administered 2014-07-23: 7 [IU] via SUBCUTANEOUS
  Administered 2014-07-23: 3 [IU] via SUBCUTANEOUS
  Administered 2014-07-23: 9 [IU] via SUBCUTANEOUS
  Administered 2014-07-24: 1 [IU] via SUBCUTANEOUS
  Administered 2014-07-25: 3 [IU] via SUBCUTANEOUS

## 2014-07-20 MED ORDER — CARVEDILOL 3.125 MG PO TABS
3.1250 mg | ORAL_TABLET | Freq: Two times a day (BID) | ORAL | Status: DC
  Filled 2014-07-20 (×4): qty 1

## 2014-07-20 NOTE — Progress Notes (Signed)
Dialysis nurse called to say that the nephrologist did not want to stop the peritoneal dialysis treatment because the patient was 10 kg over her dry weight. BP has improved a little at this time. 94/54 (57). Will continue to monitor and notify MD as needed.

## 2014-07-20 NOTE — Progress Notes (Signed)
[facility] Progress Note   Subjective: doing well, no SOB or swelling. Off dopamine and milrinone, still on neo gtt  Filed Vitals:   07/20/14 0830 07/20/14 0900 07/20/14 0930 07/20/14 1000  BP: 95/56 87/50 101/59 109/61  Pulse: 79 79 80 79  Temp:      TempSrc:      Resp: 22 16 18 16   Height:      Weight:      SpO2: 97% 96% 98% 96%   Exam: Alert, no distress +JVD Chest dec'd L base, R clear RRR no MRG Abd soft, NTND, LLQ PD cath RLE wrapped Trace bilat LE edema Neuro is alert, ox 3, nf  PD: CCPD  w a dinnertime 2L pause and four 2-liter exchanges overnight, no last fill.  Dwell time 1.5 hours.  Dry wt 50.5kg per clinic but pt says she is up to about 53 kg. Uses mostly 2.5 % at home, K usually low.  Last alb 2.7, K 3.3, creat 14, BUN 45       Assessment: 1. S/p 2 vessel CABG/MV repair/closure of PFO 3/21 - stable postop 2. 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 Volume - up 8kg by wt's today 4. Hypotension , improving on neo gtt 5. Secondary hyperparathyroidism - resume binders and calcitriol when taking po 6. Anemia - Hb on pre-op labs 3/17 was 13.5. Has had no recent requirement for ESA. Will continue to monitor inpt 7. HTN - coreg / cozaar at home, just getting low dose coreg here   Plan- cont PD per cycler nightly.  Gradually increasing UF, all 2.5% Eldridge Abrahams MD  pager [phone]    cell [phone]  07/20/2014, 10:18 AM     Recent Labs Lab 07/14/14 1457  07/19/14 0500 07/19/14 1600 07/19/14 1623 07/20/14 0400  NA 137  < > 141  --  135 135  K 3.1*  < > 3.6  --  4.1 3.5  CL 96  < > 106  --  100 100  CO2 21  --  22  --   --  23  GLUCOSE 135*  < > 79  --  118* 133*  BUN 45*  < > 45*  --  42* 43*  CREATININE 13.81*  < > 11.43* 11.56* 11.20* 10.46*  CALCIUM 7.0*  --  6.7*  --   --  6.9*  PHOS  --   --  6.0*  --   --   --   < > = values in this interval not displayed.  Recent Labs Lab  07/14/14 1457 07/19/14 0500  AST 16  --   ALT 15  --   ALKPHOS 99  --   BILITOT 1.1  --   PROT 6.2  --   ALBUMIN 2.6* 2.9*    Recent Labs Lab 07/19/14 0426 07/19/14 1600 07/19/14 1623 07/20/14 0400  WBC 9.1 10.7*  --  8.8  HGB 10.2* 10.6* 11.6* 9.7*  HCT 31.3* 31.5* 34.0* 29.0*  MCV 76.2* 76.5*  --  76.1*  PLT 120* 114*  --  104*   . acetaminophen  1,000 mg Oral 4 times per day   Or  . acetaminophen (TYLENOL) oral liquid 160 mg/5 mL  1,000 mg Per Tube 4 times per day  . antiseptic oral rinse  7 mL Mouth Rinse BID  . aspirin EC  325 mg Oral Daily   Or  . aspirin  324 mg Per Tube Daily  . bisacodyl  10 mg Oral Daily   Or  . bisacodyl  10 mg Rectal Daily  . calcium acetate  2,001 mg Oral TID WC  . carvedilol  3.125 mg Oral BID WC  . chlorhexidine  15 mL Mouth Rinse BID  . docusate sodium  200 mg Oral Daily  . gabapentin  100 mg Oral Daily  . insulin aspart  0-9 Units Subcutaneous TID WC  . insulin aspart  3 Units Subcutaneous TID WC  . insulin detemir  10 Units Subcutaneous Daily  . magnesium sulfate  4 g Intravenous Once  . metoCLOPramide (REGLAN) injection  10 mg Intravenous 3 times per day  . mirtazapine  15 mg Oral QHS  . pantoprazole  40 mg Oral Daily  . simvastatin  5 mg Oral QHS  . sodium chloride  3 mL Intravenous Q12H   . sodium chloride Stopped (07/19/14 0900)  . sodium chloride 250 mL (07/18/14 2326)  . sodium chloride 20 mL/hr at 07/19/14 0700  . dexmedetomidine Stopped (07/18/14 1830)  . dialysis solution 1.5% low-MG/low-CA    . dialysis solution 2.5% low-MG/low-CA    . lactated ringers Stopped (07/18/14 1518)  . lactated ringers Stopped (07/18/14 1518)  . milrinone Stopped (07/19/14 1100)  . nitroGLYCERIN Stopped (07/18/14 1900)  . phenylephrine (NEO-SYNEPHRINE) Adult infusion 40 mcg/min (07/20/14 1000)   sodium chloride, metoprolol, morphine injection, ondansetron (ZOFRAN) IV, oxyCODONE, sodium chloride, traMADol

## 2014-07-20 NOTE — Progress Notes (Signed)
Patient BP trending down and while on peritoneal dialysis. Patient is alert, oriented, and asymptomatic. Dr. Roxy Manns notified of vital signs and recommended speaking to nephrology about stopping dialysis. Spoke with dialysis nurses and she was going to call the nephrologist on-call.

## 2014-07-20 NOTE — Progress Notes (Signed)
Notified by patient's nurse about low trending BPs. Attending MD requesting input from Nephrology MD concerning current peritoneal dialysis treatment. I spoke with Dr. Joelyn Oms who expressed concern about patient being app. 10 kg over her dry weight.  He did not want to stop or make changes to her current regimen and suggested her BP be managed another way.  [place] made aware.

## 2014-07-20 NOTE — Progress Notes (Signed)
Hypoglycemic Event  CBG: 33  Treatment: 15 GM carbohydrate snack  Symptoms: None  Follow-up CBG: Time:1224 CBG Result:32  Possible Reasons for Event: Inadequate meal intake  Comments/MD notified: Lenna Gilford, Lemon Sternberg L  Remember to initiate Hypoglycemia Order Set & complete

## 2014-07-20 NOTE — Progress Notes (Addendum)
T[facility] DAILY ICU PROGRESS NOTE                   [address]            [place], [address]          [phone]   2 Days Post-Op Procedure(s) (LRB): CORONARY ARTERY BYPASS GRAFTING (CABG)X2 LIMA-LAD; SVG-PD (N/A) MITRAL VALVE REPAIR (MVR) (N/A) TRANSESOPHAGEAL ECHOCARDIOGRAM (TEE) (N/A)  Total Length of Stay:  LOS: 2 days   Subjective: Feels ok, improving  Objective: Vital signs in last 24 hours: Temp:  [97.5 F (36.4 C)-98.9 F (37.2 C)] 98.9 F (37.2 C) (03/23 0802) Pulse Rate:  [79-90] 79 (03/23 0800) Cardiac Rhythm:  [-] Atrial paced (03/23 0400) Resp:  [6-32] 18 (03/23 0800) BP: (73-116)/(40-73) 81/62 mmHg (03/23 0800) SpO2:  [95 %-100 %] 95 % (03/23 0800) Arterial Line BP: (86-129)/(44-69) 93/53 mmHg (03/22 1600) Weight:  [141 lb 1.5 oz (64 kg)] 141 lb 1.5 oz (64 kg) (03/22 1200)  Filed Weights   07/18/14 0549 07/19/14 1200  Weight: 117 lb (53.071 kg) 141 lb 1.5 oz (64 kg)    Weight change:    Hemodynamic parameters for last 24 hours:    Intake/Output from previous day: 03/22 0701 - 03/23 0700 In: 1398.3 [P.O.:540; I.V.:758.3; IV Piggyback:100] Out: 60 [Chest Tube:60]  Intake/Output this shift:    Current Meds: Scheduled Meds: . acetaminophen  1,000 mg Oral 4 times per day   Or  . acetaminophen (TYLENOL) oral liquid 160 mg/5 mL  1,000 mg Per Tube 4 times per day  . antiseptic oral rinse  7 mL Mouth Rinse BID  . aspirin EC  325 mg Oral Daily   Or  . aspirin  324 mg Per Tube Daily  . bisacodyl  10 mg Oral Daily   Or  . bisacodyl  10 mg Rectal Daily  . calcium acetate  2,001 mg Oral TID WC  . carvedilol  12.5 mg Oral BID WC  . chlorhexidine  15 mL Mouth Rinse BID  . docusate sodium  200 mg Oral Daily  . gabapentin  100 mg Oral Daily  . insulin aspart  0-24 Units Subcutaneous 6 times per day  . insulin aspart  3 Units Subcutaneous TID WC  . insulin detemir  10 Units Subcutaneous Daily  . magnesium sulfate  4 g Intravenous Once    . metoCLOPramide (REGLAN) injection  10 mg Intravenous 3 times per day  . mirtazapine  15 mg Oral QHS  . pantoprazole  40 mg Oral Daily  . simvastatin  5 mg Oral QHS  . sodium chloride  3 mL Intravenous Q12H   Continuous Infusions: . sodium chloride Stopped (07/19/14 0900)  . sodium chloride 250 mL (07/18/14 2326)  . sodium chloride 20 mL/hr at 07/19/14 0700  . dexmedetomidine Stopped (07/18/14 1830)  . dialysis solution 1.5% low-MG/low-CA    . dialysis solution 2.5% low-MG/low-CA    . DOPamine Stopped (07/19/14 1200)  . lactated ringers Stopped (07/18/14 1518)  . lactated ringers Stopped (07/18/14 1518)  . milrinone Stopped (07/19/14 1100)  . nitroGLYCERIN Stopped (07/18/14 1900)  . phenylephrine (NEO-SYNEPHRINE) Adult infusion 40 mcg/min (07/20/14 0430)   PRN Meds:.sodium chloride, metoprolol, morphine injection, ondansetron (ZOFRAN) IV, oxyCODONE, sodium chloride, traMADol  General appearance: alert, cooperative and no distress Heart: regular rate and rhythm and no murmur heard Lungs: dim in the bases Abdomen: soft, nontender or distended Extremities: no sig peripheral edema Wound: incis healing well  Lab  Results: CBC: Recent Labs  07/19/14 1600 07/19/14 1623 07/20/14 0400  WBC 10.7*  --  8.8  HGB 10.6* 11.6* 9.7*  HCT 31.5* 34.0* 29.0*  PLT 114*  --  104*   BMET:  Recent Labs  07/19/14 0500  07/19/14 1623 07/20/14 0400  NA 141  --  135 135  K 3.6  --  4.1 3.5  CL 106  --  100 100  CO2 22  --   --  23  GLUCOSE 79  --  118* 133*  BUN 45*  --  42* 43*  CREATININE 11.43*  < > 11.20* 10.46*  CALCIUM 6.7*  --   --  6.9*  < > = values in this interval not displayed.  PT/INR:  Recent Labs  07/18/14 1447  LABPROT 19.7*  INR 1.65*   ABG    Component Value Date/Time   PHART 7.269* 07/19/2014 0247   PCO2ART 41.0 07/19/2014 0247   PO2ART 83.0 07/19/2014 0247   HCO3 18.9* 07/19/2014 0247   TCO2 17 07/19/2014 1623   ACIDBASEDEF 8.0* 07/19/2014 0247    O2SAT 95.0 07/19/2014 0247    Radiology: Dg Chest Port 1 View  07/19/2014   CLINICAL DATA:  Coronary artery disease.  Status post CABG.  EXAM: PORTABLE CHEST - 1 VIEW  COMPARISON:  07/18/2014  FINDINGS: Endotracheal tube and NG tube have been removed. Swan-Ganz catheter is in the main pulmonary artery. 2 chest tubes are in place. No pneumothorax.  Minimal atelectasis at the left lung base.  Right lung is clear.  IMPRESSION: Minimal atelectasis at the left lung base.   Electronically Signed   By: Lorriane Shire M.D.   On: 07/19/2014 07:31     Assessment/Plan: S/P Procedure(s) (LRB): CORONARY ARTERY BYPASS GRAFTING (CABG)X2 LIMA-LAD; SVG-PD (N/A) MITRAL VALVE REPAIR (MVR) (N/A) TRANSESOPHAGEAL ECHOCARDIOGRAM (TEE) (N/A)  1 doing well 2 tolerating PD- nephrology managing 3 pulm toilet and advance activ as tol.  4 BP is requiring cont neo- wean as able 5 Monitor H/H and platelets 6 cont a pace for sinus brady   [name],Priscilla E 07/20/2014 8:20 AM  Patient seen and examined, agree with above Looks great Only issue keeping her in ICU is hypotension, still on neo drip- wean as tolerated Will decrease coreg- although it has been held due to BP Change CBG to AC/HS  Remo Lipps C. Roxan Hockey, MD [facility] [phone]

## 2014-07-20 NOTE — Progress Notes (Signed)
CT surgery p.m. Rounds  Status post CABG with mitral valve repair Chronic renal failure on peritoneal dialysis-still on Neo-Synephrine 35 mcg/m CBG is well-controlled

## 2014-07-20 NOTE — Progress Notes (Signed)
Hypoglycemic Event  CBG: 68  Treatment: D50 IV 50 mL  Symptoms: None  Follow-up CBG: Time:1800 CBG Result:168  Possible Reasons for Event: Inadequate meal intake  Comments/MD notified: Lenna Gilford, Fahima Cifelli L  Remember to initiate Hypoglycemia Order Set & complete

## 2014-07-21 ENCOUNTER — Inpatient Hospital Stay (HOSPITAL_COMMUNITY): Payer: Medicare Other

## 2014-07-21 LAB — GLUCOSE, CAPILLARY
Glucose-Capillary: 42 mg/dL — CL (ref 70–99)
Glucose-Capillary: 131 mg/dL — ABNORMAL HIGH (ref 70–99)
Glucose-Capillary: 105 mg/dL — ABNORMAL HIGH (ref 70–99)
Glucose-Capillary: 95 mg/dL (ref 70–99)
Glucose-Capillary: 119 mg/dL — ABNORMAL HIGH (ref 70–99)

## 2014-07-21 LAB — TYPE AND SCREEN
ABO/RH(D): O POS
ANTIBODY SCREEN: NEGATIVE
Unit division: 0
Unit division: 0
Unit division: 0
Unit division: 0
Unit division: 0
Unit division: 0

## 2014-07-21 LAB — CBC
HEMATOCRIT: 31.2 % — AB (ref 36.0–46.0)
Hemoglobin: 10 g/dL — ABNORMAL LOW (ref 12.0–15.0)
MCH: 24.4 pg — AB (ref 26.0–34.0)
MCHC: 32.1 g/dL (ref 30.0–36.0)
MCV: 76.3 fL — AB (ref 78.0–100.0)
Platelets: 134 10*3/uL — ABNORMAL LOW (ref 150–400)
RBC: 4.09 MIL/uL (ref 3.87–5.11)
RDW: 17 % — AB (ref 11.5–15.5)
WBC: 5.2 10*3/uL (ref 4.0–10.5)

## 2014-07-21 LAB — RENAL FUNCTION PANEL
Albumin: 2 g/dL — ABNORMAL LOW (ref 3.5–5.2)
Anion gap: 13 (ref 5–15)
BUN: 40 mg/dL — ABNORMAL HIGH (ref 6–23)
CALCIUM: 6.7 mg/dL — AB (ref 8.4–10.5)
CO2: 22 mmol/L (ref 19–32)
Chloride: 98 mmol/L (ref 96–112)
Creatinine, Ser: 9.58 mg/dL — ABNORMAL HIGH (ref 0.50–1.10)
GFR calc Af Amer: 5 mL/min — ABNORMAL LOW (ref >90)
GFR, EST NON AFRICAN AMERICAN: 4 mL/min — AB (ref >90)
GLUCOSE: 178 mg/dL — AB (ref 70–99)
PHOSPHORUS: 6.3 mg/dL — AB (ref 2.3–4.6)
POTASSIUM: 3.4 mmol/L — AB (ref 3.5–5.1)
SODIUM: 133 mmol/L — AB (ref 135–145)

## 2014-07-21 LAB — CARBOXYHEMOGLOBIN
Carboxyhemoglobin: 1.5 % (ref 0.5–1.5)
Methemoglobin: 1.6 % — ABNORMAL HIGH (ref 0.0–1.5)
O2 Saturation: 76.3 %
TOTAL HEMOGLOBIN: 11.1 g/dL — AB (ref 12.0–16.0)

## 2014-07-21 MED ORDER — POTASSIUM CHLORIDE 10 MEQ/50ML IV SOLN
10.0000 meq | INTRAVENOUS | Status: AC
  Administered 2014-07-21 – 2014-07-22 (×3): 10 meq via INTRAVENOUS
  Filled 2014-07-21 (×3): qty 50

## 2014-07-21 MED ORDER — INSULIN DETEMIR 100 UNIT/ML ~~LOC~~ SOLN
7.0000 [IU] | Freq: Every day | SUBCUTANEOUS | Status: DC
  Filled 2014-07-21: qty 0.07

## 2014-07-21 MED ORDER — POTASSIUM CHLORIDE CRYS ER 10 MEQ PO TBCR: 30.0000 meq | EXTENDED_RELEASE_TABLET | Freq: Once | ORAL | Status: DC

## 2014-07-21 MED ORDER — CALCIUM CARBONATE ANTACID 500 MG PO CHEW
400.0000 mg | CHEWABLE_TABLET | Freq: Two times a day (BID) | ORAL | Status: DC
  Administered 2014-07-21 – 2014-07-22 (×4): 400 mg via ORAL
  Filled 2014-07-21 (×6): qty 2

## 2014-07-21 MED ORDER — DELFLEX-LC/1.5% DEXTROSE 346 MOSM/L IP SOLN: INTRAPERITONEAL | Status: DC

## 2014-07-21 MED ORDER — INSULIN DETEMIR 100 UNIT/ML ~~LOC~~ SOLN
5.0000 [IU] | Freq: Every day | SUBCUTANEOUS | Status: DC
  Administered 2014-07-21 – 2014-07-22 (×2): 5 [IU] via SUBCUTANEOUS
  Filled 2014-07-21 (×3): qty 0.05

## 2014-07-21 MED ORDER — POTASSIUM CHLORIDE CRYS ER 10 MEQ PO TBCR
EXTENDED_RELEASE_TABLET | ORAL | Status: AC
  Administered 2014-07-21: 30 meq
  Filled 2014-07-21: qty 3

## 2014-07-21 MED ORDER — POTASSIUM CHLORIDE CRYS ER 20 MEQ PO TBCR
30.0000 meq | EXTENDED_RELEASE_TABLET | Freq: Three times a day (TID) | ORAL | Status: AC
  Administered 2014-07-21: 30 meq via ORAL

## 2014-07-21 MED ORDER — POTASSIUM CHLORIDE CRYS ER 10 MEQ PO TBCR
EXTENDED_RELEASE_TABLET | ORAL | Status: AC
  Filled 2014-07-21: qty 1

## 2014-07-21 MED ORDER — INSULIN DETEMIR 100 UNIT/ML ~~LOC~~ SOLN
10.0000 [IU] | Freq: Every day | SUBCUTANEOUS | Status: DC
  Filled 2014-07-21: qty 0.1

## 2014-07-21 NOTE — Progress Notes (Signed)
CT surgery p.m. Rounds  Patient was up in chair today Remains with intermittent nausea and poor appetite Plans for peritoneal dialysis tonight Continue current care

## 2014-07-21 NOTE — Progress Notes (Addendum)
[facility] Progress Note   Subjective: doing well, up in chair, on low dose of neo now.  1300 cc off w overnight PD  Filed Vitals:   07/21/14 0600 07/21/14 0700 07/21/14 0728 07/21/14 0800  BP: 90/53 101/58  101/58  Pulse: 79 79  79  Temp:   99.2 F (37.3 C)   TempSrc:   Oral   Resp: 19 15  15   Height:      Weight:      SpO2: 95% 96%  96%   Exam: Alert, no distress +JVD Chest dec'd L base, R clear RRR no MRG Abd soft, NTND, LLQ PD cath RLE wrapped Trace bilat LE edema Neuro is alert, ox 3, nf  PD: CCPD  w a dinnertime 2L pause and four 2-liter exchanges overnight, no last fill.  Dwell time 1.5 hours.  Dry wt 50.5kg per clinic but pt says she is up to about 53 kg. Uses mostly 2.5 % at home, K usually low.  Last alb 2.7, K 3.3, creat 14, BUN 45       Assessment: 1. S/p 2 vessel CABG/MV repair/closure of PFO 3/21 - stable postop 2. 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 Volume - remains up by wts, no resp symptoms 4. Hypotension - weaning neo gtt 5. MBD - resumed vit D, phoslo 6. Anemia - Hb 10, no esa for now 7. HTN - holding home meds ( coreg/ cozaar) 8. HypoK - supplement po 9. HypoCa - supplement po   Plan- cont PD, lowest UF possible for now, KCl, Ca supplements    Kelly Splinter MD  pager [phone]    cell [phone]  07/21/2014, 9:42 AM     Recent Labs Lab 07/19/14 0500  07/19/14 1623 07/20/14 0400 07/21/14 0400  NA 141  --  135 135 133*  K 3.6  --  4.1 3.5 3.4*  CL 106  --  100 100 98  CO2 22  --   --  23 22  GLUCOSE 79  --  118* 133* 178*  BUN 45*  --  42* 43* 40*  CREATININE 11.43*  < > 11.20* 10.46* 9.58*  CALCIUM 6.7*  --   --  6.9* 6.7*  PHOS 6.0*  --   --   --  6.3*  < > = values in this interval not displayed.  Recent Labs Lab 07/14/14 1457 07/19/14 0500 07/21/14 0400  AST 16  --   --   ALT 15  --   --   ALKPHOS 99  --   --   BILITOT 1.1  --   --   PROT 6.2  --   --   ALBUMIN  2.6* 2.9* 2.0*    Recent Labs Lab 07/19/14 1600 07/19/14 1623 07/20/14 0400 07/21/14 0400  WBC 10.7*  --  8.8 5.2  HGB 10.6* 11.6* 9.7* 10.0*  HCT 31.5* 34.0* 29.0* 31.2*  MCV 76.5*  --  76.1* 76.3*  PLT 114*  --  104* 134*   . acetaminophen  1,000 mg Oral 4 times per day   Or  . acetaminophen (TYLENOL) oral liquid 160 mg/5 mL  1,000 mg Per Tube 4 times per day  . antiseptic oral rinse  7 mL Mouth Rinse BID  . aspirin EC  325 mg Oral Daily   Or  . aspirin  324 mg Per Tube Daily  . bisacodyl  10 mg Oral Daily   Or  .  bisacodyl  10 mg Rectal Daily  . calcitRIOL  0.25 mcg Oral Daily  . calcium acetate  2,001 mg Oral TID WC  . docusate sodium  200 mg Oral Daily  . gabapentin  100 mg Oral Daily  . insulin aspart  0-9 Units Subcutaneous TID WC  . insulin detemir  10 Units Subcutaneous Daily  . magnesium sulfate  4 g Intravenous Once  . mirtazapine  15 mg Oral QHS  . pantoprazole  40 mg Oral Daily  . simvastatin  5 mg Oral QHS  . sodium chloride  3 mL Intravenous Q12H   . sodium chloride Stopped (07/19/14 0900)  . sodium chloride 250 mL (07/18/14 2326)  . sodium chloride 10 mL/hr at 07/21/14 0550  . dexmedetomidine Stopped (07/18/14 1830)  . dialysis solution 2.5% low-MG/low-CA    . lactated ringers Stopped (07/18/14 1518)  . lactated ringers Stopped (07/18/14 1518)  . nitroGLYCERIN Stopped (07/18/14 1900)  . phenylephrine (NEO-SYNEPHRINE) Adult infusion 10 mcg/min (07/21/14 0800)   sodium chloride, morphine injection, ondansetron (ZOFRAN) IV, oxyCODONE, sodium chloride, traMADol

## 2014-07-21 NOTE — Progress Notes (Addendum)
T[facility] DAILY ICU PROGRESS NOTE                   [address]            [place] [address]          [phone]   3 Days Post-Op Procedure(s) (LRB): CORONARY ARTERY BYPASS GRAFTING (CABG)X2 LIMA-LAD; SVG-PD (N/A) MITRAL VALVE REPAIR (MVR) (N/A) TRANSESOPHAGEAL ECHOCARDIOGRAM (TEE) (N/A)  Total Length of Stay:  LOS: 3 days   Subjective: Feels ok, BP still runs low  Objective: Vital signs in last 24 hours: Temp:  [97.7 F (36.5 C)-99.2 F (37.3 C)] 99.2 F (37.3 C) (03/24 0728) Pulse Rate:  [79-81] 79 (03/24 0700) Cardiac Rhythm:  [-] Atrial paced (03/24 0400) Resp:  [13-24] 15 (03/24 0700) BP: (86-122)/(50-76) 101/58 mmHg (03/24 0700) SpO2:  [94 %-100 %] 96 % (03/24 0700) Weight:  [135 lb 2.3 oz (61.3 kg)] 135 lb 2.3 oz (61.3 kg) (03/24 0500)  Filed Weights   07/19/14 1200 07/20/14 1000 07/21/14 0500  Weight: 141 lb 1.5 oz (64 kg) 135 lb 2.3 oz (61.3 kg) 135 lb 2.3 oz (61.3 kg)    Weight change: -5 lb 15.2 oz (-2.7 kg)   Hemodynamic parameters for last 24 hours:    Intake/Output from previous day: 03/23 0701 - 03/24 0700 In: 986 [P.O.:240; I.V.:746] Out: 200 [Stool:200]  Intake/Output this shift:    Current Meds: Scheduled Meds: . acetaminophen  1,000 mg Oral 4 times per day   Or  . acetaminophen (TYLENOL) oral liquid 160 mg/5 mL  1,000 mg Per Tube 4 times per day  . antiseptic oral rinse  7 mL Mouth Rinse BID  . aspirin EC  325 mg Oral Daily   Or  . aspirin  324 mg Per Tube Daily  . bisacodyl  10 mg Oral Daily   Or  . bisacodyl  10 mg Rectal Daily  . calcitRIOL  0.25 mcg Oral Daily  . calcium acetate  2,001 mg Oral TID WC  . carvedilol  3.125 mg Oral BID WC  . chlorhexidine  15 mL Mouth Rinse BID  . docusate sodium  200 mg Oral Daily  . gabapentin  100 mg Oral Daily  . insulin aspart  0-9 Units Subcutaneous TID WC  . insulin detemir  10 Units Subcutaneous Daily  . magnesium sulfate  4 g Intravenous Once  . mirtazapine  15 mg Oral  QHS  . pantoprazole  40 mg Oral Daily  . simvastatin  5 mg Oral QHS  . sodium chloride  3 mL Intravenous Q12H   Continuous Infusions: . sodium chloride Stopped (07/19/14 0900)  . sodium chloride 250 mL (07/18/14 2326)  . sodium chloride 10 mL/hr at 07/21/14 0550  . dexmedetomidine Stopped (07/18/14 1830)  . dialysis solution 2.5% low-MG/low-CA    . lactated ringers Stopped (07/18/14 1518)  . lactated ringers Stopped (07/18/14 1518)  . milrinone Stopped (07/19/14 1100)  . nitroGLYCERIN Stopped (07/18/14 1900)  . phenylephrine (NEO-SYNEPHRINE) Adult infusion 15 mcg/min (07/21/14 0600)   PRN Meds:.sodium chloride, metoprolol, morphine injection, ondansetron (ZOFRAN) IV, oxyCODONE, sodium chloride, traMADol  General appearance: alert, cooperative and no distress Heart: regular rate and rhythm Lungs: dim in bases Abdomen: soft Extremities: no edema Wound: incis healing well  Lab Results: CBC: Recent Labs  07/20/14 0400 07/21/14 0400  WBC 8.8 5.2  HGB 9.7* 10.0*  HCT 29.0* 31.2*  PLT 104* 134*   BMET:  Recent Labs  07/20/14 0400 07/21/14  0400  NA 135 133*  K 3.5 3.4*  CL 100 98  CO2 23 22  GLUCOSE 133* 178*  BUN 43* 40*  CREATININE 10.46* 9.58*  CALCIUM 6.9* 6.7*    PT/INR:  Recent Labs  07/18/14 1447  LABPROT 19.7*  INR 1.65*   Radiology: Dg Chest Port 1 View  07/20/2014   CLINICAL DATA:  Status post CABG and mitral valve repair on July 18, 2014  EXAM: PORTABLE CHEST - 1 VIEW  COMPARISON:  Portable chest x-ray of July 19, 2014  FINDINGS: The lungs are mildly hypoinflated. There is increased atelectasis in the right lower lobe. There is a small right pleural effusion more apparent today. On the left there is persistent increased retrocardiac density and peripheral subsegmental atelectasis. A small pleural effusion on the left is suspected. The cardiac silhouette remains enlarged. The pulmonary vascularity is more engorged today.  There has been interval removal  of the Swan-Ganz catheter. The right internal jugular Cordis sheath projects over the junction of the proximal and middle thirds of the SVC. The chest tubes have been removed.  IMPRESSION: 1. Interval deterioration in the appearance of the lungs especially on the right where there is new subsegmental atelectasis and small right pleural effusion. On the left increased retrocardiac density is consistent with atelectasis. 2. Persistent enlargement of the cardiac silhouette with increased pulmonary vascular congestion today.   Electronically Signed   By: David  Martinique   On: 07/20/2014 07:56     Assessment/Plan: S/P Procedure(s) (LRB): CORONARY ARTERY BYPASS GRAFTING (CABG)X2 LIMA-LAD; SVG-PD (N/A) MITRAL VALVE REPAIR (MVR) (N/A) TRANSESOPHAGEAL ECHOCARDIOGRAM (TEE) (N/A)  1 stable on NEO for low BP- cont to try to wean. conts apacing 2 had some low CBG's- decrease levimir a little 3 nephrology assisting with PD/renal issues 4 H/H stable, platelets improving 5 pulm toilet/rehab     GOLD,[name] 07/21/2014 8:04 AM Patient seen and examined, agree with above Will dc atrial pacing- in junctional at 65 under pacer Wean neo drip  Mobilize!  Revonda Standard Roxan Hockey, MD [facility] [phone]

## 2014-07-21 NOTE — Progress Notes (Signed)
Inpatient Diabetes Program Recommendations  AACE/ADA: New Consensus Statement on Inpatient Glycemic Control (2013)  Target Ranges:  Prepandial:   less than 140 mg/dL      Peak postprandial:   less than 180 mg/dL (1-2 hours)      Critically ill patients:  140 - 180 mg/dL   Results for Priscilla [name], Priscilla [name] (MRN [id number]) as of 07/21/2014 09:39  Ref. Range 07/20/2014 03:43 07/20/2014 08:00 07/20/2014 11:55 07/20/2014 12:24 07/20/2014 12:59 07/20/2014 16:52 07/20/2014 18:05 07/20/2014 21:44 07/21/2014 07:27  Glucose-Capillary Latest Range: 70-99 mg/dL 141 (H) 71 33 (LL) 32 (LL) 158 (H) 68 (L) 163 (H) 150 (H) 131 (H)   Outpatient Diabetes medications: Lantus 10 units daily, Humalog 10-15 units TID with meals Current orders for Inpatient glycemic control: Levemir 10 units daily, Novolog 0-9 units TID with meals  Inpatient Diabetes Program Recommendations Insulin - Basal: Patient received Levemir 10 units yesterday morning and currenly ordered Levemir 10 units daily. Please consider decreasing Levemir to 5 units daily.  Thanks, Barnie Alderman, RN, MSN, CCRN, CDE Diabetes Coordinator Inpatient Diabetes Program [phone] (Team Pager from [place] to [place]) [phone] ([facility] office) [phone] [facility] office)

## 2014-07-22 ENCOUNTER — Inpatient Hospital Stay (HOSPITAL_COMMUNITY): Payer: Medicare Other

## 2014-07-22 DIAGNOSIS — Z951 Presence of aortocoronary bypass graft: Secondary | ICD-10-CM

## 2014-07-22 DIAGNOSIS — I255 Ischemic cardiomyopathy: Secondary | ICD-10-CM

## 2014-07-22 DIAGNOSIS — I495 Sick sinus syndrome: Secondary | ICD-10-CM

## 2014-07-22 LAB — GLUCOSE, CAPILLARY
GLUCOSE-CAPILLARY: 145 mg/dL — AB (ref 70–99)
GLUCOSE-CAPILLARY: 73 mg/dL (ref 70–99)
GLUCOSE-CAPILLARY: 184 mg/dL — AB (ref 70–99)

## 2014-07-22 LAB — RENAL FUNCTION PANEL
ALBUMIN: 1.8 g/dL — AB (ref 3.5–5.2)
Anion gap: 12 (ref 5–15)
BUN: 43 mg/dL — ABNORMAL HIGH (ref 6–23)
CALCIUM: 7.3 mg/dL — AB (ref 8.4–10.5)
CO2: 22 mmol/L (ref 19–32)
Chloride: 97 mmol/L (ref 96–112)
Creatinine, Ser: 9.76 mg/dL — ABNORMAL HIGH (ref 0.50–1.10)
GFR, EST AFRICAN AMERICAN: 5 mL/min — AB (ref >90)
GFR, EST NON AFRICAN AMERICAN: 4 mL/min — AB (ref >90)
GLUCOSE: 151 mg/dL — AB (ref 70–99)
PHOSPHORUS: 6.4 mg/dL — AB (ref 2.3–4.6)
POTASSIUM: 5 mmol/L (ref 3.5–5.1)
Sodium: 131 mmol/L — ABNORMAL LOW (ref 135–145)

## 2014-07-22 LAB — CBC
HCT: 31.4 % — ABNORMAL LOW (ref 36.0–46.0)
HEMOGLOBIN: 10.2 g/dL — AB (ref 12.0–15.0)
MCH: 25 pg — ABNORMAL LOW (ref 26.0–34.0)
MCHC: 32.5 g/dL (ref 30.0–36.0)
MCV: 77 fL — AB (ref 78.0–100.0)
Platelets: 143 10*3/uL — ABNORMAL LOW (ref 150–400)
RBC: 4.08 MIL/uL (ref 3.87–5.11)
RDW: 17.3 % — AB (ref 11.5–15.5)
WBC: 7.5 10*3/uL (ref 4.0–10.5)

## 2014-07-22 NOTE — Progress Notes (Signed)
      [address]       [place] [address]             [phone]      Feels much better this afternoon  Ambulated twice  BP 127/73 mmHg  Pulse 74  Temp(Src) 98.9 F (37.2 C) (Oral)  Resp 20  Ht 5' (1.524 m)  Wt 134 lb 0.6 oz (60.8 kg)  BMI 26.18 kg/m2  SpO2 99%  LMP 07/16/2014   Intake/Output Summary (Last 24 hours) at 07/22/14 1811 Last data filed at 07/22/14 1700  Gross per 24 hour  Intake 520.85 ml  Output      0 ml  Net 520.85 ml    Appreciate Dr. Jackalyn Lombard input  CBG up this afternoon- may need to resume meal coverage  Remo Lipps C. Roxan Hockey, MD [facility] [phone]

## 2014-07-22 NOTE — Progress Notes (Signed)
4 Days Post-Op Procedure(s) (LRB): CORONARY ARTERY BYPASS GRAFTING (CABG)X2 LIMA-LAD; SVG-PD (N/A) MITRAL VALVE REPAIR (MVR) (N/A) TRANSESOPHAGEAL ECHOCARDIOGRAM (TEE) (N/A) Subjective: Feels better today Some indigestion persists  Objective: Vital signs in last 24 hours: Temp:  [98.7 F (37.1 C)-99.9 F (37.7 C)] 99 F (37.2 C) (03/25 0731) Pulse Rate:  [56-70] 70 (03/25 0700) Cardiac Rhythm:  [-] Atrial paced (03/25 0400) Resp:  [17-42] 20 (03/24 1800) BP: (85-134)/(49-74) 106/65 mmHg (03/25 0700) SpO2:  [94 %-100 %] 96 % (03/25 0700)  Hemodynamic parameters for last 24 hours:    Intake/Output from previous day: 03/24 0701 - 03/25 0700 In: 892.5 [P.O.:360; I.V.:382.5; IV Piggyback:150] Out: -  Intake/Output this shift:    General appearance: alert, cooperative and no distress Neurologic: intact Heart: regular rate and rhythm Lungs: diminished breath sounds bibasilar Abdomen: PD in progress, + BS Wound: clean and dry  Lab Results:  Recent Labs  07/21/14 0400 07/22/14 0450  WBC 5.2 7.5  HGB 10.0* 10.2*  HCT 31.2* 31.4*  PLT 134* 143*   BMET:  Recent Labs  07/21/14 0400 07/22/14 0450  NA 133* 131*  K 3.4* 5.0  CL 98 97  CO2 22 22  GLUCOSE 178* 151*  BUN 40* 43*  CREATININE 9.58* 9.76*  CALCIUM 6.7* 7.3*    PT/INR: No results for input(s): LABPROT, INR in the last 72 hours. ABG    Component Value Date/Time   PHART 7.269* 07/19/2014 0247   HCO3 18.9* 07/19/2014 0247   TCO2 17 07/19/2014 1623   ACIDBASEDEF 8.0* 07/19/2014 0247   O2SAT 76.3 07/21/2014 1310   CBG (last 3)   Recent Labs  07/21/14 1147 07/21/14 1713 07/21/14 2150  GLUCAP 105* 95 119*    Assessment/Plan: S/P Procedure(s) (LRB): CORONARY ARTERY BYPASS GRAFTING (CABG)X2 LIMA-LAD; SVG-PD (N/A) MITRAL VALVE REPAIR (MVR) (N/A) TRANSESOPHAGEAL ECHOCARDIOGRAM (TEE) (N/A) -  CV- she is off neo now but is bradycardic into the 40s requiring pacing. Was junctional in the 68s  yesterday. Will ask Cardiology to weigh in. Beta blocker dc'ed  RESP- small effusions and some atelectasis- IS, continue PD  RENAL- PD per Nephrology  ENDO- CBG control improved  Anemia- acute secondary to ABL on top of chronic due to ESRD- Hgb stable  Mobilize- needs to increase ambulation  DVT prophylaxis- SCD + heparin in PD   LOS: 4 days    Melrose Nakayama 07/22/2014

## 2014-07-22 NOTE — Progress Notes (Signed)
07/22/2014 12:44 PM  Noted new orders to transition patient from [facility] to [facility].  Spoke with Dr. Jonnie Finner via telephone and confirmed orders.  Order is for 5 exchanges per 24 hours, discussed with Dr. Jonnie Finner that patient will not be able to get five in today due to change of orders at this time of day.  Will initiate treatment as soon as possible and try to get about three exchanges in today manually if possible.    Princella Pellegrini

## 2014-07-22 NOTE — Progress Notes (Signed)
Spoke with patient about diabetes and home regimen for diabetes control. Patient reports that she has Type 1 DM and is followed by Dr. Chalmers Cater for diabetes management.  Currently she takes Lantus 10 units QHS and Humalog 0-15 units TID with meals (meal coverage (1 unit for every 15 grams) plus sliding scale) as an outpatient for diabetes control. Patient reports that she "has been doing what I am suppose to do for my diabetes for about 1 month". Patient reports that prior to about 1 month ago she was not checking glucose as requested and was not taking her insulin consistently like she should. Patient reports that she checks her glucose 4 times per day and over the past 3-4 weeks her glucose has improved significantly and is mostly in the 150-170 mg/dl range. Inquired about knowledge about A1C and patient reports that she knows what an A1C is and reports that her last A1C was in the 10% range. Discussed A1C results (10.0% on 07/14/14) and discussed A1C reflects glucose value average over the past 2-3 months and reviewed how A1C values correlate to actual glucose values. Stressed importance of checking CBGs and taking insulin as prescribed. Discussed importance of maintaining good CBG control to prevent long-term and short-term complications especially given recent surgery. Discussed impact of nutrition, exercise, stress, sickness, and medications on diabetes control.  Patient states that she has made positive changes over the past month and she intends to continue to work toward getting diabetes controlled.  Encouraged patient to continue to check CBGs at least 4 times per day and work with Dr. Chalmers Cater to make adjustments to her insulin regimen as needed based on glucose trends.  Patient verbalized understanding of information discussed and she states that she has no further questions at this time related to diabetes.   Thanks, Barnie Alderman, RN, MSN, CCRN Diabetes Coordinator [facility]patient [facility] [phone]  (Team Pager) [phone] ([facility] office) [phone] [facility] office)

## 2014-07-22 NOTE — Consult Note (Signed)
ELECTROPHYSIOLOGY CONSULT NOTE    Patient ID: Priscilla [name] MRN: [id number], DOB/AGE: [birth year] 44 y.o.  Admit date: 07/18/2014 Date of Consult: 07/22/2014  Primary Physician: [name], MD Primary Cardiologist: [place] Referring Physician: Roxan Hockey  Reason for Consultation: sinus node dysfunction s/p CABG and MVR  HPI:  Priscilla [name] is a 44 y.o. female with a past medical history significant for type 1 diabetes complicated by renal failure for which she is on peritoneal dialysis.  She underwent stress testing as part of workup for transplant at [facility] which demonstrated inferior scar without ischemia.  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 She was referred to [facility] who recommended CABG and MV repair with annuloplasty ring which was done on 07-18-14.  Her post op course has been complicated by junctional rhythm and sinus bradycardia.  EP has been asked to evaluate for treatment options.   She currently has incisional pain but no shortness of breath.  She is having some problems with indigestion.  She denies previous dizziness, syncope or pre-syncope.    Echocardiogram 04/2014 demonstrated EF 35-40%, mid and basal inferior wall hypokinesis, moderate to severe TR.   Past Medical History  Diagnosis Date  . H/O kidney transplant   . Pancreas transplanted   . Diabetes mellitus without complication   . Hypertension   . Renal disorder   . Anemia   . GERD (gastroesophageal reflux disease)   . End stage renal disease   . Hyperlipidemia   . Cardiomyopathy, ischemic 06/24/2014  . 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 S/P mitral valve repair 07/18/14     Surgical History:  Past Surgical History  Procedure Laterality Date  . 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  Pancreas left mid abdomen  . Cesarean section    . Laparoscopic insertion peritoneal catheter  07/18/2008    Dr Excell Seltzer  . Laparoscopic repositioning  capd catheter  May 31, 202010    Dr Excell Seltzer  . Laparoscopic repositioning capd catheter  11/08/2008    Dr Excell Seltzer  . Omentectomy  11/08/2008    Dr Excell Seltzer  . Laparoscopic repositioning capd catheter  10/03/2009    Unplugging CAPD catheter Dr Excell Seltzer  . Insertion of dialysis catheter  September 28, 2012    Right upper chest  . Capd insertion N/A 09/08/2013    Procedure: Laparoscopic CAPD peritoneal dialysis catheter placement, possible lysis of adhesions   ;  Surgeon: Adin Hector, MD;  Location: [place];  Service: General;  Laterality: N/A;  . Left heart catheterization with coronary angiogram N/A 06/28/2014    Procedure: LEFT HEART CATHETERIZATION WITH CORONARY ANGIOGRAM;  Surgeon: [name], MD;  Location: [place] CATH [facility];  Service: Cardiovascular;  Laterality: N/A;  . Cardiac catheterization      06/2014  . Coronary artery bypass graft N/A 07/18/2014    Procedure: CORONARY ARTERY BYPASS GRAFTING (CABG)X2 LIMA-LAD; SVG-PD;  Surgeon: Melrose Nakayama, MD;  Location: [place];  Service: Open Heart Surgery;  Laterality: N/A;  . Mitral valve repair N/A 07/18/2014    Procedure: MITRAL VALVE REPAIR (MVR);  Surgeon: Melrose Nakayama, MD;  Location: [place];  Service: Open Heart Surgery;  Laterality: N/A;  . Tee without cardioversion N/A 07/18/2014    Procedure: TRANSESOPHAGEAL ECHOCARDIOGRAM (TEE);  Surgeon: Melrose Nakayama, MD;  Location: [place];  Service: Open Heart Surgery;  Laterality: N/A;     Prescriptions prior to admission  Medication Sig Dispense Refill Last Dose  . acetaminophen (TYLENOL) 325 MG  tablet Take 650 mg by mouth every 4 (four) hours as needed for mild pain.   Past Week at Unknown time  . aspirin 325 MG tablet Take 325 mg by mouth daily.    07/17/2014 at Unknown time  . calcium acetate (PHOSLO) 667 MG capsule Take 2,001 mg by mouth 3 (three) times daily with meals.   07/17/2014 at Unknown time  . carvedilol (COREG) 12.5 MG tablet Take 1 tablet (12.5 mg total) by mouth 2 (two) times  daily. 180 tablet 3 07/18/2014 at 0430  . gabapentin (NEURONTIN) 100 MG capsule Take 100 mg by mouth daily.   Past Week at Unknown time  . insulin glargine (LANTUS) 100 UNIT/ML injection Inject 10 Units into the skin at bedtime.    Past Week at Unknown time  . insulin lispro (HUMALOG) 100 UNIT/ML injection Inject 10-15 Units into the skin 3 (three) times daily before meals. Per sliding scale   07/17/2014 at Unknown time  . losartan (COZAAR) 100 MG tablet Take 100 mg by mouth daily.   07/17/2014 at Unknown time  . mirtazapine (REMERON) 15 MG tablet Take 15 mg by mouth at bedtime.   Past Month at Unknown time  . simvastatin (ZOCOR) 5 MG tablet Take 5 mg by mouth at bedtime.    07/17/2014 at Unknown time  . omeprazole (PRILOSEC) 20 MG capsule Take 20 mg by mouth daily as needed.    Taking    Inpatient Medications:  . acetaminophen  1,000 mg Oral 4 times per day   Or  . acetaminophen (TYLENOL) oral liquid 160 mg/5 mL  1,000 mg Per Tube 4 times per day  . antiseptic oral rinse  7 mL Mouth Rinse BID  . aspirin EC  325 mg Oral Daily   Or  . aspirin  324 mg Per Tube Daily  . bisacodyl  10 mg Oral Daily   Or  . bisacodyl  10 mg Rectal Daily  . calcitRIOL  0.25 mcg Oral Daily  . calcium acetate  2,001 mg Oral TID WC  . calcium carbonate  400 mg of elemental calcium Oral BID BM  . docusate sodium  200 mg Oral Daily  . gabapentin  100 mg Oral Daily  . insulin aspart  0-9 Units Subcutaneous TID WC  . insulin detemir  5 Units Subcutaneous Daily  . magnesium sulfate  4 g Intravenous Once  . mirtazapine  15 mg Oral QHS  . pantoprazole  40 mg Oral Daily  . potassium chloride  30 mEq Oral Once  . simvastatin  5 mg Oral QHS  . sodium chloride  3 mL Intravenous Q12H    Allergies:  Allergies  Allergen Reactions  . Pork-Derived Products Other (See Comments)    Pt is [place]    History   Social History  . Marital Status: Married    Spouse Name: N/A  . Number of Children: N/A  . Years of  Education: N/A   Occupational History  . Not on file.   Social History Main Topics  . Smoking status: Never Smoker   . Smokeless tobacco: Never Used  . Alcohol Use: No  . Drug Use: No  . Sexual Activity: Not Currently    Birth Control/ Protection: None   Other Topics Concern  . Not on file   Social History Narrative     Family History  Problem Relation Age of Onset  . CAD Mother   . Stroke Mother   . COPD Father  Review of Systems: All other systems reviewed and are otherwise negative except as noted above.  Physical Exam: Filed Vitals:   07/22/14 0800 07/22/14 0900 07/22/14 1000 07/22/14 1128  BP: 113/71 123/72 122/64   Pulse: 75 73 73   Temp:    98.6 F (37 C)  TempSrc:    Oral  Resp:      Height:      Weight:      SpO2: 94% 95% 96%     GEN- The patient is well appearing, alert and oriented x 3 today.   HEENT: normocephalic, atraumatic; sclera clear, conjunctiva pink; hearing intact; oropharynx clear; neck supple  Lymph- no cervical lymphadenopathy Lungs- Clear to ausculation bilaterally, normal work of breathing.  No wheezes, rales, rhonchi Heart- Regular rate and rhythm  GI- soft, non-tender, non-distended, bowel sounds present, currently undergoing PD Extremities- no clubbing, cyanosis, or edema; DP/PT/radial pulses 2+ bilaterally MS- no significant deformity or atrophy Skin- warm and dry, no rash or lesion, healing sternotomy  Psych- euthymic mood, full affect Neuro- strength and sensation are intact  Labs:   Lab Results  Component Value Date   WBC 7.5 07/22/2014   HGB 10.2* 07/22/2014   HCT 31.4* 07/22/2014   MCV 77.0* 07/22/2014   PLT 143* 07/22/2014    Recent Labs Lab 07/22/14 0450  NA 131*  K 5.0  CL 97  CO2 22  BUN 43*  CREATININE 9.76*  CALCIUM 7.3*  GLUCOSE 151*      Radiology/Studies: Dg Chest 2 View 07/14/2014   CLINICAL DATA:  Scheduled for coronary artery bypass surgery next week. History of hypertension.  EXAM: CHEST   2 VIEW  COMPARISON:  04/21/2014.  FINDINGS: Stable mildly enlarged cardiac silhouette and mildly prominent pulmonary vasculature. The lungs are clear. No pleural fluid. Unremarkable bones.  IMPRESSION: Stable mild cardiomegaly and mild pulmonary vascular congestion with resolved interstitial pulmonary edema.   Electronically Signed   By: Claudie Revering M.D.   On: 07/14/2014 15:36   Dg Chest Port 1 View 07/21/2014   CLINICAL DATA:  Mitral regurgitation, status post mitral valve replacement  EXAM: PORTABLE CHEST - 1 VIEW  COMPARISON:  July 20, 2014  FINDINGS: Cordis tip is in the superior vena cava. No pneumothorax. There is persistent atelectatic change in the left base. There is airspace consolidation in the left lower lobe with air bronchograms in this area. There are small pleural effusions bilaterally. Heart is enlarged with pulmonary vascularity within normal limits. Patient is status post mitral valve replacement as well as coronary artery bypass grafting. Temporary pacemaker wires are attached to the right heart.  IMPRESSION: Left lower lobe consolidation. Small bilateral effusions. Stable atelectatic change left mid lung. Stable cardiomegaly. No pneumothorax.   Electronically Signed   By: Lowella Grip III M.D.   On: 07/21/2014 07:24   EKG: sinus rhythm, RBBB  TELEMETRY: atrial pacing with intrinsic ventricular conduction, underlying SB 30's  Assessment/Plan: 1. Sinus node dysfunction s/p CABG/MVR Pt with sinus node dysfunction requiring atrial pacing post surgery.  Carvedilol discontinued yesterday.  She has underlying sinus rhythm in the 30's.  Will monitor over the weekend.  If persistent bradycardia on Monday, will likely need permanent pacing.    2.  ICM EF 30% at post-op TEE Beta blocker on hold due to bradycardia ARB on hold due to hypotension  3.  ESRD PD per nephrology  4.  Type 1 diabetes Per endocrine   Signed, Chanetta Marshall, NP 07/22/2014 12:51 PM  I have seen,  examined the patient, and reviewed the above assessment and plan.  Changes to above are made where necessary.   GIven young age, I would like to avoid pacing if possible.  Dr Caryl Comes to follow over the weekend.  If sinus rate does not improve, may need to consider pacing on Monday.  I am optimistic that her EF will recover.  Co Sign: Thompson Grayer, MD 07/22/2014 4:01 PM

## 2014-07-23 LAB — RENAL FUNCTION PANEL
Albumin: 1.8 g/dL — ABNORMAL LOW (ref 3.5–5.2)
Anion gap: 11 (ref 5–15)
BUN: 49 mg/dL — ABNORMAL HIGH (ref 6–23)
CALCIUM: 7.7 mg/dL — AB (ref 8.4–10.5)
CO2: 25 mmol/L (ref 19–32)
Chloride: 97 mmol/L (ref 96–112)
Creatinine, Ser: 9.69 mg/dL — ABNORMAL HIGH (ref 0.50–1.10)
GFR calc Af Amer: 5 mL/min — ABNORMAL LOW (ref >90)
GFR calc non Af Amer: 4 mL/min — ABNORMAL LOW (ref >90)
Glucose, Bld: 257 mg/dL — ABNORMAL HIGH (ref 70–99)
PHOSPHORUS: 5.2 mg/dL — AB (ref 2.3–4.6)
POTASSIUM: 3.9 mmol/L (ref 3.5–5.1)
Sodium: 133 mmol/L — ABNORMAL LOW (ref 135–145)

## 2014-07-23 LAB — CBC
HEMATOCRIT: 31.6 % — AB (ref 36.0–46.0)
HEMOGLOBIN: 10.2 g/dL — AB (ref 12.0–15.0)
MCH: 24.8 pg — AB (ref 26.0–34.0)
MCHC: 32.3 g/dL (ref 30.0–36.0)
MCV: 76.7 fL — ABNORMAL LOW (ref 78.0–100.0)
PLATELETS: 174 10*3/uL (ref 150–400)
RBC: 4.12 MIL/uL (ref 3.87–5.11)
RDW: 17.3 % — AB (ref 11.5–15.5)
WBC: 9 10*3/uL (ref 4.0–10.5)

## 2014-07-23 LAB — GLUCOSE, CAPILLARY
GLUCOSE-CAPILLARY: 311 mg/dL — AB (ref 70–99)
GLUCOSE-CAPILLARY: 250 mg/dL — AB (ref 70–99)
Glucose-Capillary: 190 mg/dL — ABNORMAL HIGH (ref 70–99)
Glucose-Capillary: 362 mg/dL — ABNORMAL HIGH (ref 70–99)
Glucose-Capillary: 88 mg/dL (ref 70–99)

## 2014-07-23 MED ORDER — POTASSIUM CHLORIDE CRYS ER 20 MEQ PO TBCR
20.0000 meq | EXTENDED_RELEASE_TABLET | Freq: Every day | ORAL | Status: DC
  Administered 2014-07-24 – 2014-07-25 (×2): 20 meq via ORAL
  Filled 2014-07-23 (×3): qty 1

## 2014-07-23 MED ORDER — POTASSIUM CHLORIDE CRYS ER 20 MEQ PO TBCR
40.0000 meq | EXTENDED_RELEASE_TABLET | Freq: Once | ORAL | Status: AC
  Administered 2014-07-23: 40 meq via ORAL
  Filled 2014-07-23: qty 2

## 2014-07-23 MED ORDER — INSULIN GLARGINE 100 UNIT/ML ~~LOC~~ SOLN
10.0000 [IU] | Freq: Every day | SUBCUTANEOUS | Status: DC
  Administered 2014-07-23: 10 [IU] via SUBCUTANEOUS
  Filled 2014-07-23 (×3): qty 0.1

## 2014-07-23 MED ORDER — INSULIN ASPART 100 UNIT/ML ~~LOC~~ SOLN
5.0000 [IU] | Freq: Three times a day (TID) | SUBCUTANEOUS | Status: DC
  Administered 2014-07-23 – 2014-07-24 (×5): 5 [IU] via SUBCUTANEOUS

## 2014-07-23 MED ORDER — CALCIUM CARBONATE ANTACID 500 MG PO CHEW
200.0000 mg | CHEWABLE_TABLET | Freq: Two times a day (BID) | ORAL | Status: AC
  Administered 2014-07-23 – 2014-07-24 (×4): 200 mg via ORAL
  Filled 2014-07-23 (×5): qty 1

## 2014-07-23 NOTE — Progress Notes (Signed)
[name] Progress Note   Subjective: feeling better, no CP, no SOB , occ cough.  800cc, 1100 cc off last 2 PD exchanges  Filed Vitals:   07/23/14 0200 07/23/14 0300 07/23/14 0400 07/23/14 0500  BP: 130/66 119/67 137/75 120/71  Pulse: 73 73 73 73  Temp:   98.3 F (36.8 C)   TempSrc:   Oral   Resp:      Height:      Weight:    58.6 kg (129 lb 3 oz)  SpO2: 96% 97% 97% 97%   Exam: Alert, no distress +JVD Chest rales 1/4 up L>R RRR no MRG Abd soft, NTND, LLQ PD cath RLE wrapped Trace bilat LE edema Neuro is alert, ox 3, nf  PD: CCPD  w a dinnertime 2L pause and four 2-liter exchanges overnight, no last fill.  Dwell time 1.5 hours.  Dry wt 50.5kg per clinic but pt says she is up to about 53 kg. Uses mostly 2.5 % at home, K usually low.  Last alb 2.7, K 3.3, creat 14, BUN 45       Assessment: 1. S/p 2 vessel CABG/MV repair/closure of PFO 3/21 2. ESRD on PD 3. Volume excess - improving, still up 5-6 kg 4. HTN - holding all BP meds, BP's are low to normal 5. Bradycardia - paced rhythm at this time, BB stopped 6. MBD - resumed vit D, phoslo 7. Anemia - Hb 10, no esa for now 8. HypoK - better, ordered daily 20 after 40 today 9. HypoCa - better, dec Ca supp   Plan - cont PD w max volume removal until at dry wt    Kelly Splinter MD  pager [phone]    cell [phone]  07/23/2014, 8:06 AM     Recent Labs Lab 07/21/14 0400 07/22/14 0450 07/23/14 0414  NA 133* 131* 133*  K 3.4* 5.0 3.9  CL 98 97 97  CO2 22 22 25   GLUCOSE 178* 151* 257*  BUN 40* 43* 49*  CREATININE 9.58* 9.76* 9.69*  CALCIUM 6.7* 7.3* 7.7*  PHOS 6.3* 6.4* 5.2*    Recent Labs Lab 07/21/14 0400 07/22/14 0450 07/23/14 0414  ALBUMIN 2.0* 1.8* 1.8*    Recent Labs Lab 07/21/14 0400 07/22/14 0450 07/23/14 0413  WBC 5.2 7.5 9.0  HGB 10.0* 10.2* 10.2*  HCT 31.2* 31.4* 31.6*  MCV 76.3* 77.0* 76.7*  PLT 134* 143* 174   . acetaminophen  1,000 mg Oral 4 times per day   Or  .  acetaminophen (TYLENOL) oral liquid 160 mg/5 mL  1,000 mg Per Tube 4 times per day  . aspirin EC  325 mg Oral Daily   Or  . aspirin  324 mg Per Tube Daily  . bisacodyl  10 mg Oral Daily   Or  . bisacodyl  10 mg Rectal Daily  . calcitRIOL  0.25 mcg Oral Daily  . calcium acetate  2,001 mg Oral TID WC  . calcium carbonate  400 mg of elemental calcium Oral BID BM  . docusate sodium  200 mg Oral Daily  . gabapentin  100 mg Oral Daily  . insulin aspart  0-9 Units Subcutaneous TID WC  . insulin detemir  5 Units Subcutaneous Daily  . magnesium sulfate  4 g Intravenous Once  . mirtazapine  15 mg Oral QHS  . pantoprazole  40 mg Oral Daily  . potassium chloride  30 mEq Oral Once  . simvastatin  5 mg Oral QHS  . sodium chloride  3 mL Intravenous Q12H   . sodium chloride Stopped (07/19/14 0900)  . sodium chloride 250 mL (07/18/14 2326)  . phenylephrine (NEO-SYNEPHRINE) Adult infusion Stopped (07/22/14 0445)   sodium chloride, morphine injection, ondansetron (ZOFRAN) IV, oxyCODONE, sodium chloride, traMADol

## 2014-07-23 NOTE — Progress Notes (Signed)
Am weight 58.6kg  Dry weight standing ,drain bag 4.0kg/8.8lbs overnight dwell.tol procedure well.

## 2014-07-23 NOTE — Progress Notes (Signed)
5 Days Post-Op Procedure(s) (LRB): CORONARY ARTERY BYPASS GRAFTING (CABG)X2 LIMA-LAD; SVG-PD (N/A) MITRAL VALVE REPAIR (MVR) (N/A) TRANSESOPHAGEAL ECHOCARDIOGRAM (TEE) (N/A) Subjective: Didn't sleep well last night  Objective: Vital signs in last 24 hours: Temp:  [98.3 F (36.8 C)-98.9 F (37.2 C)] 98.3 F (36.8 C) (03/26 0400) Pulse Rate:  [72-75] 73 (03/26 0500) Cardiac Rhythm:  [-] Atrial paced (03/26 0800) BP: (110-150)/(64-85) 118/71 mmHg (03/26 0800) SpO2:  [94 %-99 %] 97 % (03/26 0500) Weight:  [129 lb 3 oz (58.6 kg)-134 lb 0.6 oz (60.8 kg)] 129 lb 3 oz (58.6 kg) (03/26 0500)  Hemodynamic parameters for last 24 hours:    Intake/Output from previous day: 03/25 0701 - 03/26 0700 In: 230 [I.V.:230] Out: -  Intake/Output this shift: Total I/O In: 10 [I.V.:10] Out: -   General appearance: alert and no distress Neurologic: intact Heart: regular rate and rhythm Abdomen: normal findings: soft, non-tender Wound: clean and dry  Lab Results:  Recent Labs  07/22/14 0450 07/23/14 0413  WBC 7.5 9.0  HGB 10.2* 10.2*  HCT 31.4* 31.6*  PLT 143* 174   BMET:  Recent Labs  07/22/14 0450 07/23/14 0414  NA 131* 133*  K 5.0 3.9  CL 97 97  CO2 22 25  GLUCOSE 151* 257*  BUN 43* 49*  CREATININE 9.76* 9.69*  CALCIUM 7.3* 7.7*    PT/INR: No results for input(s): LABPROT, INR in the last 72 hours. ABG    Component Value Date/Time   PHART 7.269* 07/19/2014 0247   HCO3 18.9* 07/19/2014 0247   TCO2 17 07/19/2014 1623   ACIDBASEDEF 8.0* 07/19/2014 0247   O2SAT 76.3 07/21/2014 1310   CBG (last 3)   Recent Labs  07/22/14 1125 07/22/14 1702 07/22/14 2210  GLUCAP 73 184* 190*    Assessment/Plan: S/P Procedure(s) (LRB): CORONARY ARTERY BYPASS GRAFTING (CABG)X2 LIMA-LAD; SVG-PD (N/A) MITRAL VALVE REPAIR (MVR) (N/A) TRANSESOPHAGEAL ECHOCARDIOGRAM (TEE) (N/A) Plan for transfer to step-down: see transfer orders   CV- atrial paced- in SB at 55 under pacer- appears  to be recovering  RESP- continue IS  RENAL- doing well with PD  ENDO- CBG up overnight- resume meal coverage and home dose of insulin  Continue ambulation  Transfer to 2W   LOS: 5 days    Melrose Nakayama 07/23/2014

## 2014-07-23 NOTE — Progress Notes (Signed)
[facility] Progress Note   Subjective: had CP episode last night, BP's up to normal , off neo gtt.   Filed Vitals:   07/23/14 0200 07/23/14 0300 07/23/14 0400 07/23/14 0500  BP: 130/66 119/67 137/75 120/71  Pulse: 73 73 73 73  Temp:   98.3 F (36.8 C)   TempSrc:   Oral   Resp:      Height:      Weight:    58.6 kg (129 lb 3 oz)  SpO2: 96% 97% 97% 97%   Exam: Alert, no distress +JVD Chest insp bilat rales 2/3 up which is new RRR no MRG Abd soft, NTND, LLQ PD cath RLE wrapped Trace bilat LE edema Neuro is alert, ox 3, nf  PD: CCPD  w a dinnertime 2L pause and four 2-liter exchanges overnight, no last fill.  Dwell time 1.5 hours.  Dry wt 50.5kg per clinic but pt says she is up to about 53 kg. Uses mostly 2.5 % at home, K usually low.  Last alb 2.7, K 3.3, creat 14, BUN 45       Assessment: 1. S/p 2 vessel CABG/MV repair/closure of PFO 3/21 2. ESRD on PD 3. Volume excess - fluid excess re-distributing, new rales/ CP, suspect vol is issue 4. Hypotension - resolved, BP's higher today 5. MBD - resumed vit D, phoslo 6. Anemia - Hb 10, no esa for now 7. HypoK - supplement po 8. HypoCa - supplement po   Plan - will change to manual exchanges, max UF for control of volume    Kelly Splinter MD  pager [phone]    cell [phone]  07/22/2014, 9:44 AM     Recent Labs Lab 07/21/14 0400 07/22/14 0450 07/23/14 0414  NA 133* 131* 133*  K 3.4* 5.0 3.9  CL 98 97 97  CO2 22 22 25   GLUCOSE 178* 151* 257*  BUN 40* 43* 49*  CREATININE 9.58* 9.76* 9.69*  CALCIUM 6.7* 7.3* 7.7*  PHOS 6.3* 6.4* 5.2*    Recent Labs Lab 07/21/14 0400 07/22/14 0450 07/23/14 0414  ALBUMIN 2.0* 1.8* 1.8*    Recent Labs Lab 07/21/14 0400 07/22/14 0450 07/23/14 0413  WBC 5.2 7.5 9.0  HGB 10.0* 10.2* 10.2*  HCT 31.2* 31.4* 31.6*  MCV 76.3* 77.0* 76.7*  PLT 134* 143* 174   . acetaminophen  1,000 mg Oral 4 times per day   Or  . acetaminophen (TYLENOL) oral liquid 160  mg/5 mL  1,000 mg Per Tube 4 times per day  . aspirin EC  325 mg Oral Daily   Or  . aspirin  324 mg Per Tube Daily  . bisacodyl  10 mg Oral Daily   Or  . bisacodyl  10 mg Rectal Daily  . calcitRIOL  0.25 mcg Oral Daily  . calcium acetate  2,001 mg Oral TID WC  . calcium carbonate  200 mg of elemental calcium Oral BID BM  . docusate sodium  200 mg Oral Daily  . gabapentin  100 mg Oral Daily  . insulin aspart  0-9 Units Subcutaneous TID WC  . insulin detemir  5 Units Subcutaneous Daily  . magnesium sulfate  4 g Intravenous Once  . mirtazapine  15 mg Oral QHS  . pantoprazole  40 mg Oral Daily  . potassium chloride  30 mEq Oral Once  . [START ON 07/24/2014] potassium chloride  20 mEq Oral Daily  . potassium chloride  40 mEq Oral Once  . simvastatin  5 mg Oral  QHS  . sodium chloride  3 mL Intravenous Q12H   . sodium chloride Stopped (07/19/14 0900)  . sodium chloride 250 mL (07/18/14 2326)   sodium chloride, morphine injection, ondansetron (ZOFRAN) IV, oxyCODONE, sodium chloride, traMADol

## 2014-07-24 DIAGNOSIS — I340 Nonrheumatic mitral (valve) insufficiency: Secondary | ICD-10-CM

## 2014-07-24 DIAGNOSIS — R001 Bradycardia, unspecified: Secondary | ICD-10-CM

## 2014-07-24 DIAGNOSIS — I25118 Atherosclerotic heart disease of native coronary artery with other forms of angina pectoris: Secondary | ICD-10-CM

## 2014-07-24 DIAGNOSIS — Z9889 Other specified postprocedural states: Secondary | ICD-10-CM

## 2014-07-24 DIAGNOSIS — I5042 Chronic combined systolic (congestive) and diastolic (congestive) heart failure: Secondary | ICD-10-CM

## 2014-07-24 LAB — GLUCOSE, CAPILLARY
GLUCOSE-CAPILLARY: 73 mg/dL (ref 70–99)
GLUCOSE-CAPILLARY: 138 mg/dL — AB (ref 70–99)
GLUCOSE-CAPILLARY: 71 mg/dL (ref 70–99)
GLUCOSE-CAPILLARY: 110 mg/dL — AB (ref 70–99)
Glucose-Capillary: 92 mg/dL (ref 70–99)

## 2014-07-24 MED ORDER — DELFLEX-LC/2.5% DEXTROSE 394 MOSM/L IP SOLN: INTRAPERITONEAL | Status: DC

## 2014-07-24 MED ORDER — CARVEDILOL 3.125 MG PO TABS
3.1250 mg | ORAL_TABLET | Freq: Two times a day (BID) | ORAL | Status: DC
  Administered 2014-07-24 – 2014-07-25 (×2): 3.125 mg via ORAL
  Filled 2014-07-24 (×5): qty 1

## 2014-07-24 MED ORDER — LOSARTAN POTASSIUM 50 MG PO TABS
100.0000 mg | ORAL_TABLET | Freq: Every day | ORAL | Status: DC
  Administered 2014-07-24 – 2014-07-25 (×2): 100 mg via ORAL
  Filled 2014-07-24 (×2): qty 2

## 2014-07-24 MED ORDER — DELFLEX-LC/4.25% DEXTROSE 483 MOSM/L IP SOLN: INTRAPERITONEAL | Status: DC

## 2014-07-24 NOTE — Progress Notes (Signed)
[address]       [employer] [id number]             [phone]      6 Days Post-Op Procedure(s) (LRB): CORONARY ARTERY BYPASS GRAFTING (CABG)X2 LIMA-LAD; SVG-PD (N/A) MITRAL VALVE REPAIR (MVR) (N/A) TRANSESOPHAGEAL ECHOCARDIOGRAM (TEE) (N/A) Subjective: HR is stable off pacer, she conts to feel better  Objective: Vital signs in last 24 hours: Temp:  [97.7 F (36.5 C)-98.7 F (37.1 C)] 98.7 F (37.1 C) (03/26 2105) Pulse Rate:  [66-75] 69 (03/26 2105) Cardiac Rhythm:  [-] Atrial paced (03/26 2300) Resp:  [16-18] 18 (03/26 2105) BP: (105-181)/(61-79) 105/61 mmHg (03/26 2105) SpO2:  [94 %-99 %] 94 % (03/26 2105) Weight:  [124 lb 1.9 oz (56.3 kg)] 124 lb 1.9 oz (56.3 kg) (03/27 0645)  Hemodynamic parameters for last 24 hours:    Intake/Output from previous day: 03/26 0701 - 03/27 0700 In: 40 [I.V.:40] Out: -  Intake/Output this shift:    General appearance: alert, cooperative and no distress Heart: regular rate and rhythm Lungs: clear to auscultation bilaterally Abdomen: benign Extremities: no signif edema Wound: incis healing well  Lab Results:  Recent Labs  07/22/14 0450 07/23/14 0413  WBC 7.5 9.0  HGB 10.2* 10.2*  HCT 31.4* 31.6*  PLT 143* 174   BMET:  Recent Labs  07/22/14 0450 07/23/14 0414  NA 131* 133*  K 5.0 3.9  CL 97 97  CO2 22 25  GLUCOSE 151* 257*  BUN 43* 49*  CREATININE 9.76* 9.69*  CALCIUM 7.3* 7.7*    PT/INR: No results for input(s): LABPROT, INR in the last 72 hours. ABG    Component Value Date/Time   PHART 7.269* 07/19/2014 0247   HCO3 18.9* 07/19/2014 0247   TCO2 17 07/19/2014 1623   ACIDBASEDEF 8.0* 07/19/2014 0247   O2SAT 76.3 07/21/2014 1310   CBG (last 3)   Recent Labs  07/23/14 1632 07/23/14 2123 07/24/14 0602  GLUCAP 250* 88 73    Meds Scheduled Meds: . aspirin EC  325 mg Oral Daily   Or  . aspirin  324 mg Per Tube Daily  . bisacodyl  10 mg Oral Daily   Or  . bisacodyl  10 mg  Rectal Daily  . calcitRIOL  0.25 mcg Oral Daily  . calcium acetate  2,001 mg Oral TID WC  . calcium carbonate  200 mg of elemental calcium Oral BID BM  . docusate sodium  200 mg Oral Daily  . gabapentin  100 mg Oral Daily  . insulin aspart  0-9 Units Subcutaneous TID WC  . insulin aspart  5 Units Subcutaneous TID WC  . insulin glargine  10 Units Subcutaneous QHS  . magnesium sulfate  4 g Intravenous Once  . mirtazapine  15 mg Oral QHS  . pantoprazole  40 mg Oral Daily  . potassium chloride  30 mEq Oral Once  . potassium chloride  20 mEq Oral Daily  . simvastatin  5 mg Oral QHS  . sodium chloride  3 mL Intravenous Q12H   Continuous Infusions: . sodium chloride Stopped (07/19/14 0900)  . sodium chloride 250 mL (07/18/14 2326)   PRN Meds:.sodium chloride, morphine injection, ondansetron (ZOFRAN) IV, oxyCODONE, sodium chloride, traMADol  Xrays Dg Chest Port 1v Same Day  07/22/2014   CLINICAL DATA:  Shortness of breath.  EXAM: PORTABLE CHEST - 1 VIEW SAME DAY  COMPARISON:  07/21/2014.  FINDINGS: Right IJ sheath is noted in stable position.  Cardiomegaly. Prior cardiac valve replacement and CABG. Cardiomegaly with bilateral pulmonary alveolar infiltrates and pleural effusions. Pulmonary infiltrates have increased from prior exam. These findings are consistent with progressive congestive heart failure. Overlying pneumonia cannot be excluded  IMPRESSION: 1. Right IJ sheath in stable position.  2. Cardiomegaly. Prior CABG and cardiac valve replacement. Findings consistent with worsening congestive heart failure .   Electronically Signed   By: Marcello Moores  Register   On: 07/22/2014 13:27    Assessment/Plan: S/P Procedure(s) (LRB): CORONARY ARTERY BYPASS GRAFTING (CABG)X2 LIMA-LAD; SVG-PD (N/A) MITRAL VALVE REPAIR (MVR) (N/A) TRANSESOPHAGEAL ECHOCARDIOGRAM (TEE) (N/A)  1 sinus node conts to recover, HR in 70's, cont to monitor but prob wont need pacer, No beta blocker currently 2 Conts PD per  nephrology 3 CBG's a little better- monitor  Results for BOWIE, LUSH (MRN [id number]) as of 07/24/2014 08:43  Ref. Range 07/23/2014 09:13 07/23/2014 13:00 07/23/2014 16:32 07/23/2014 21:23 07/24/2014 06:02  Glucose-Capillary Latest Range: 70-99 mg/dL 311 (H) 362 (H) 250 (H) 88 73   4 push rehab as able 5     LOS: 6 days    GOLD,[name] 07/24/2014

## 2014-07-24 NOTE — Progress Notes (Signed)
Hypoglycemic Event  CBG: 37  Treatment: 15 GM carbohydrate snack  Symptoms: Shaky  Follow-up CBG: Time:1935 CBG Result:92  Possible Reasons for Event: Other: dialysis  Comments/MD notified: patient states she feels much better after juice.     Juanetta Snow E  Remember to initiate Hypoglycemia Order Set & complete

## 2014-07-24 NOTE — Progress Notes (Signed)
Patient Name: Priscilla [name] Date of Encounter: 07/24/2014  Active Problems:   S/P CABG x 2   Length of Stay: 6  SUBJECTIVE  Sleepless night, otherwise feels well. Walked halls without O2.. No further pacing necessary - NSR in 70s Approximately 4.1L fluid removed via 4 PD exchanges in last 48 h.  CURRENT MEDS . aspirin EC  325 mg Oral Daily   Or  . aspirin  324 mg Per Tube Daily  . bisacodyl  10 mg Oral Daily   Or  . bisacodyl  10 mg Rectal Daily  . calcitRIOL  0.25 mcg Oral Daily  . calcium acetate  2,001 mg Oral TID WC  . calcium carbonate  200 mg of elemental calcium Oral BID BM  . docusate sodium  200 mg Oral Daily  . gabapentin  100 mg Oral Daily  . insulin aspart  0-9 Units Subcutaneous TID WC  . insulin aspart  5 Units Subcutaneous TID WC  . insulin glargine  10 Units Subcutaneous QHS  . magnesium sulfate  4 g Intravenous Once  . mirtazapine  15 mg Oral QHS  . pantoprazole  40 mg Oral Daily  . potassium chloride  30 mEq Oral Once  . potassium chloride  20 mEq Oral Daily  . simvastatin  5 mg Oral QHS  . sodium chloride  3 mL Intravenous Q12H    OBJECTIVE   Intake/Output Summary (Last 24 hours) at 07/24/14 0812 Last data filed at 07/23/14 1100  Gross per 24 hour  Intake     30 ml  Output      0 ml  Net     30 ml   Filed Weights   07/22/14 1200 07/23/14 0500 07/24/14 0645  Weight: 134 lb 0.6 oz (60.8 kg) 129 lb 3 oz (58.6 kg) 124 lb 1.9 oz (56.3 kg)    PHYSICAL EXAM Filed Vitals:   07/23/14 1133 07/23/14 1500 07/23/14 2105 07/24/14 0645  BP: 181/79 157/77 105/61   Pulse: 73 66 69   Temp: 97.8 F (36.6 C) 97.9 F (36.6 C) 98.7 F (37.1 C)   TempSrc: Oral Oral Oral   Resp: 18 16 18    Height:      Weight:    124 lb 1.9 oz (56.3 kg)  SpO2: 99% 98% 94%    General: Alert, oriented x3, no distress Head: no evidence of trauma, PERRL, EOMI, no exophtalmos or lid lag, no myxedema, no xanthelasma; normal ears, nose and oropharynx Neck: normal  jugular venous pulsations and no hepatojugular reflux; brisk carotid pulses without delay and no carotid bruits Chest: clear to auscultation, no signs of consolidation by percussion or palpation, normal fremitus, symmetrical and full respiratory excursions Cardiovascular: normal position and quality of the apical impulse, regular rhythm, normal first and second heart sounds, probable  pericardial rub, no gallops, 2/6 holosystolic murmur Abdomen: no tenderness or distention, no masses by palpation, no abnormal pulsatility or arterial bruits, normal bowel sounds, no hepatosplenomegaly Extremities: no clubbing, cyanosis or edema; 2+ radial, ulnar and brachial pulses bilaterally; 2+ right femoral, posterior tibial and dorsalis pedis pulses; 2+ left femoral, posterior tibial and dorsalis pedis pulses; no subclavian or femoral bruits Neurological: grossly nonfocal  LABS  CBC  Recent Labs  07/22/14 0450 07/23/14 0413  WBC 7.5 9.0  HGB 10.2* 10.2*  HCT 31.4* 31.6*  MCV 77.0* 76.7*  PLT 143* [id number]   Basic Metabolic Panel  Recent Labs  07/22/14 0450 07/23/14 0414  NA 131* 133*  K 5.0 3.9  CL 97 97  CO2 22 25  GLUCOSE 151* 257*  BUN 43* 49*  CREATININE 9.76* 9.69*  CALCIUM 7.3* 7.7*  PHOS 6.4* 5.2*   Liver Function Tests  Recent Labs  07/22/14 0450 07/23/14 0414  ALBUMIN 1.8* 1.8*    Radiology Studies Imaging results have been reviewed and Dg Chest Port 1v Same Day  07/22/2014   CLINICAL DATA:  Shortness of breath.  EXAM: PORTABLE CHEST - 1 VIEW SAME DAY  COMPARISON:  07/21/2014.  FINDINGS: Right IJ sheath is noted in stable position. Cardiomegaly. Prior cardiac valve replacement and CABG. Cardiomegaly with bilateral pulmonary alveolar infiltrates and pleural effusions. Pulmonary infiltrates have increased from prior exam. These findings are consistent with progressive congestive heart failure. Overlying pneumonia cannot be excluded  IMPRESSION: 1. Right IJ sheath in stable  position.  2. Cardiomegaly. Prior CABG and cardiac valve replacement. Findings consistent with worsening congestive heart failure .   Electronically Signed   By: Marcello Moores  Register   On: 07/22/2014 13:27    TELE NSR, no further pacing   ASSESSMENT AND PLAN  Recovering well from CABG. Transient sinus bradycardia postop, resolved holding beta blocker. Will try to gradually reintroduce lower dose carvedilol. She does not need a pacemaker. She has lost most of the extra fluid she gained periop (office visit 02/25 weight 123 lb) via PD exchanges Anticipate DC in next 24-48h. Note severe hypoalbuminemia, but she seems to be healing her surgical wounds very quickly. Plan to reevaluate LVEF in 3 months and decide on ICD therapy (currently not indicated).  [name], MD, [facility] [phone] office [phone] pager 07/24/2014 8:12 AM

## 2014-07-24 NOTE — Progress Notes (Signed)
[facility] Progress Note   Subjective: feeling good, transferred to floor.  Wts today 56kg (dry wt 53). Heart rate better, no need for pacer  Filed Vitals:   07/23/14 1133 07/23/14 1500 07/23/14 2105 07/24/14 0645  BP: 181/79 157/77 105/61   Pulse: 73 66 69   Temp: 97.8 F (36.6 C) 97.9 F (36.6 C) 98.7 F (37.1 C)   TempSrc: Oral Oral Oral   Resp: 18 16 18    Height:      Weight:    56.3 kg (124 lb 1.9 oz)  SpO2: 99% 98% 94%    Exam: Alert, no distress Chest fine rales bilat bases RRR no MRG Abd soft, NTND, LLQ PD cath RLE wrapped Trace bilat LE edema Neuro is alert, ox 3, nf  PD: CCPD  w a dinnertime 2L pause and four 2-liter exchanges overnight, no last fill.  Dwell time 1.5 hours.  Dry wt 50.5kg per clinic but pt says she is up to about 53 kg. Uses mostly 2.5 % at home, K usually low.  Last alb 2.7, K 3.3, creat 14, BUN 45       Assessment: 1. S/p 2 vessel CABG/MV repair/closure of PFO 3/21 2. ESRD on PD 3. Volume excess - improved, up 3kg 4. HTN - BP's up, put back on low dose coreg 5. Bradycardia - resolved, pacer wires removed 6. MBD - resumed vit D, phoslo 7. Anemia - Hb 10, no esa for now 8. HypoK - better, cont 20 /d 9. HypoCa - improved, dc supplements   Plan - cont CAPD this afternoon then plan CCPD tonight. Should be close to dry wt by tomorrow. Resumed Cozaar.     Kelly Splinter MD  pager [phone]    cell [phone]  07/24/2014, 9:58 AM     Recent Labs Lab 07/21/14 0400 07/22/14 0450 07/23/14 0414  NA 133* 131* 133*  K 3.4* 5.0 3.9  CL 98 97 97  CO2 22 22 25   GLUCOSE 178* 151* 257*  BUN 40* 43* 49*  CREATININE 9.58* 9.76* 9.69*  CALCIUM 6.7* 7.3* 7.7*  PHOS 6.3* 6.4* 5.2*    Recent Labs Lab 07/21/14 0400 07/22/14 0450 07/23/14 0414  ALBUMIN 2.0* 1.8* 1.8*    Recent Labs Lab 07/21/14 0400 07/22/14 0450 07/23/14 0413  WBC 5.2 7.5 9.0  HGB 10.0* 10.2* 10.2*  HCT 31.2* 31.4* 31.6*  MCV 76.3* 77.0* 76.7*  PLT  134* 143* 174   . aspirin EC  325 mg Oral Daily   Or  . aspirin  324 mg Per Tube Daily  . bisacodyl  10 mg Oral Daily   Or  . bisacodyl  10 mg Rectal Daily  . calcitRIOL  0.25 mcg Oral Daily  . calcium acetate  2,001 mg Oral TID WC  . calcium carbonate  200 mg of elemental calcium Oral BID BM  . carvedilol  3.125 mg Oral BID WC  . docusate sodium  200 mg Oral Daily  . gabapentin  100 mg Oral Daily  . insulin aspart  0-9 Units Subcutaneous TID WC  . insulin aspart  5 Units Subcutaneous TID WC  . insulin glargine  10 Units Subcutaneous QHS  . magnesium sulfate  4 g Intravenous Once  . mirtazapine  15 mg Oral QHS  . pantoprazole  40 mg Oral Daily  . potassium chloride  30 mEq Oral Once  . potassium chloride  20 mEq Oral Daily  . simvastatin  5 mg Oral QHS  . sodium  chloride  3 mL Intravenous Q12H   . sodium chloride Stopped (07/19/14 0900)  . sodium chloride 250 mL (07/18/14 2326)   sodium chloride, morphine injection, ondansetron (ZOFRAN) IV, oxyCODONE, sodium chloride, traMADol

## 2014-07-25 ENCOUNTER — Encounter (HOSPITAL_COMMUNITY)
Admission: RE | Disposition: A | Discharge status: Home / Self Care | Payer: Self-pay | Source: Ambulatory Visit | Attending: Thoracic Surgery (Cardiothoracic Vascular Surgery)

## 2014-07-25 LAB — GLUCOSE, CAPILLARY
Glucose-Capillary: 39 mg/dL — CL (ref 70–99)
Glucose-Capillary: 277 mg/dL — ABNORMAL HIGH (ref 70–99)
Glucose-Capillary: 232 mg/dL — ABNORMAL HIGH (ref 70–99)

## 2014-07-25 SURGERY — PERMANENT PACEMAKER INSERTION
Anesthesia: LOCAL

## 2014-07-25 MED ORDER — OMEPRAZOLE 20 MG PO CPDR: 20.0000 mg | DELAYED_RELEASE_CAPSULE | Freq: Every day | ORAL | Status: DC | PRN

## 2014-07-25 MED ORDER — CARVEDILOL 6.25 MG PO TABS
6.2500 mg | ORAL_TABLET | Freq: Two times a day (BID) | ORAL | Status: DC
  Filled 2014-07-25 (×2): qty 1

## 2014-07-25 MED ORDER — CARVEDILOL 3.125 MG PO TABS
3.1250 mg | ORAL_TABLET | Freq: Two times a day (BID) | ORAL | Status: DC
  Filled 2014-07-25 (×2): qty 1

## 2014-07-25 MED ORDER — CARVEDILOL 3.125 MG PO TABS: 3.1250 mg | ORAL_TABLET | Freq: Two times a day (BID) | ORAL | Status: DC

## 2014-07-25 MED ORDER — OXYCODONE HCL 5 MG PO TABS: 5.0000 mg | ORAL_TABLET | ORAL | Status: DC | PRN

## 2014-07-25 MED ORDER — CALCITRIOL 0.25 MCG PO CAPS: 0.2500 ug | ORAL_CAPSULE | Freq: Every day | ORAL | Status: DC

## 2014-07-25 NOTE — Progress Notes (Signed)
07/25/2014 7:25 AM  Patient stated that she is suppose to be dry during the day. Sterile procedure initiated. Manual drain bag was applied and patient drained with no complications. Drain bag weighted. Dry weight was completed on patient as well.   [employer], RN-BC, RN3 [facility] [employer] [phone]

## 2014-07-25 NOTE — Progress Notes (Signed)
[facility] Progress Note   Subjective: feeling good, transferred to floor.  Wts today 54kg (dry wt 53). Heart rate better, no need for pacer- is hoping that she will get to go home today- no issues with exchanges overnight  Filed Vitals:   07/24/14 0645 07/24/14 1527 07/25/14 0440 07/25/14 0650  BP:  143/72 121/73   Pulse:  73 77   Temp:  98 F (36.7 C) 99.2 F (37.3 C)   TempSrc:  Oral Oral   Resp:  17 17   Height:      Weight: 56.3 kg (124 lb 1.9 oz)  58.1 kg (128 lb 1.4 oz) 54.4 kg (119 lb 14.9 oz)  SpO2:  100% 95%    Exam: Alert, no distress Chest fine rales bilat bases RRR no MRG Abd soft, NTND, LLQ PD cath RLE wrapped Trace bilat LE edema Neuro is alert, ox 3, nf  PD: CCPD  w a dinnertime 2L pause and four 2-liter exchanges overnight, no last fill.  Dwell time 1.5 hours.  Dry wt 50.5kg per clinic but pt says she is up to about 53 kg. Uses mostly 2.5 % at home, K usually low.  Last alb 2.7, K 3.3, creat 14, BUN 45       Assessment: 1. S/p 2 vessel CABG/MV repair/closure of PFO 3/21 2. ESRD on PD- going well 3. Volume excess - improved, now only up 1 kg 4. HTN - BP's up, put back on low dose coreg 5. Bradycardia - resolved, pacer wires removed 6. MBD - resumed vit D, phoslo 7. Anemia - Hb 10, no esa for now 8. HypoK - better, cont 20 /d 9. HypoCa - improved, dc supplements   Plan - plan CCPD tonight if not discharged.  Is OK for discharge today from our standpoint.  Hopefully albumin will increase as OP    Chatara Lucente A   07/25/2014, 8:52 AM     Recent Labs Lab 07/21/14 0400 07/22/14 0450 07/23/14 0414  NA 133* 131* 133*  K 3.4* 5.0 3.9  CL 98 97 97  CO2 22 22 25   GLUCOSE 178* 151* 257*  BUN 40* 43* 49*  CREATININE 9.58* 9.76* 9.69*  CALCIUM 6.7* 7.3* 7.7*  PHOS 6.3* 6.4* 5.2*    Recent Labs Lab 07/21/14 0400 07/22/14 0450 07/23/14 0414  ALBUMIN 2.0* 1.8* 1.8*    Recent Labs Lab 07/21/14 0400 07/22/14 0450  07/23/14 0413  WBC 5.2 7.5 9.0  HGB 10.0* 10.2* 10.2*  HCT 31.2* 31.4* 31.6*  MCV 76.3* 77.0* 76.7*  PLT 134* 143* 174   . aspirin EC  325 mg Oral Daily   Or  . aspirin  324 mg Per Tube Daily  . bisacodyl  10 mg Oral Daily   Or  . bisacodyl  10 mg Rectal Daily  . calcitRIOL  0.25 mcg Oral Daily  . calcium acetate  2,001 mg Oral TID WC  . carvedilol  3.125 mg Oral BID WC  . docusate sodium  200 mg Oral Daily  . gabapentin  100 mg Oral Daily  . insulin aspart  0-9 Units Subcutaneous TID WC  . insulin aspart  5 Units Subcutaneous TID WC  . insulin glargine  10 Units Subcutaneous QHS  . losartan  100 mg Oral Daily  . mirtazapine  15 mg Oral QHS  . pantoprazole  40 mg Oral Daily  . potassium chloride  20 mEq Oral Daily  . simvastatin  5 mg Oral QHS  . sodium chloride  3 mL Intravenous Q12H   . sodium chloride Stopped (07/19/14 0900)  . sodium chloride 250 mL (07/18/14 2326)  . dialysis solution 2.5% low-MG/low-CA    . dialysis solution 4.25% low-MG/low-CA     sodium chloride, morphine injection, ondansetron (ZOFRAN) IV, oxyCODONE, sodium chloride, traMADol

## 2014-07-25 NOTE — Discharge Summary (Signed)
Physician Discharge Summary       [address]       [place] [address]             [id number]    Patient ID: Priscilla [name] MRN: [id number] DOB/AGE: 43-Jan-1972 43 y.o.  Admit date: 07/18/2014 Discharge date: 07/25/2014  Admission Diagnoses: 1. Coronary artery disease 2. Severe MR 3. History of DM 4. History of ESRD (on PD) 5. History of hyperlipidemia 6. History of pancreas and kidney transplant 7. History of hypertension 8. History of GERD 9. History of ischemic cardiomyopathy 10. History of anemia  Discharge Diagnoses:  1. Coronary artery disease 2. Severe MR 3. History of DM 4. History of ESRD (on PD) 5. History of hyperlipidemia 6. History of pancreas and kidney transplant 7. History of hypertension 8. History of GERD 9. History of ischemic cardiomyopathy 10. History of anemia  Consults: EPS  Procedure (s):  Median sternotomy, extracorporeal circulation, coronary artery bypass grafting x2 (left internal mammary artery to LAD, saphenous vein graft to posterior descending), mitral valve repair with 28 mm Sorin Memo 3D annuloplasty ring (reference number [id number] serial [id number]) endoscopic vein harvest, right thigh by Dr. Roxan Hockey on 07/18/2014.  History of Presenting Illness: This is a 44 year old woman with a complex medical history. She's had type 1 diabetes since age 70. This was complicated by renal failure for which she is on peritoneal dialysis. 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 She did not take her antirejection medications regularly due to cost. Both the kidney and pancreas grafts rejected and failed. She's been back on dialysis since June 2014. She had an AV graft placed at one time, but was never able to use it. She currently is on peritoneal dialysis.  She had a stress test as part of a workup for possible retransplantation at [facility]. It showed evidence of an inferior scar without ischemia. Her  ejection fraction was 50%.  She was ill around the holidays this past December. She was having problems with chest discomfort and shortness of breath. She was diagnosed with pneumonia. She says that for "a couple of days" she had severe chest pressure that felt like an elephant standing on her chest. She thought this was due to the pneumonia. She was having trouble with peritoneal dialysis around that time as well. She eventually went to the doctor and was found to have a friction rub consistent with pericarditis. An echocardiogram was done which showed an ejection fraction of 35-40% with hypokinesis of the septal and inferior walls. There was mild mitral insufficiency and moderate to severe tricuspid insufficiency. She was referred to Dr. Sallyanne Kuster.  An electrocardiogram showed Q waves in the septal and inferior leads and prominent anterior T-wave inversion. Those findings were new from May 2015.  She had cardiac catheterization yesterday. It revealed severe three-vessel disease including a tight proximal LAD lesion. There was moderate to severe MR on the left ventriculogram.  She has continued to work with the exception of a couple days around the holidays. She has not had any chest pain, pressure, or tightness since the event that Christmas. She says that she does get short of breath with exertion. She primarily notices this when she is walking up stairs or an incline. She has occasional leg swelling, but it is mild. She does complain of decreased energy.   Dr. Roxan Hockey recommended to [name] was that we proceed with coronary artery bypass grafting and possible mitral repair. I discussed  the general nature of the procedure, the use of cardiopulmonary bypass, the need for general anesthesia, and the incisions to be used with the patient and her family. Dr. Roxan Hockey discussed the expected hospital stay, overall recovery and short and long term outcomes.  He also discussed potential  complications, risks, and benefits of the surgery. She agreed to proceed with surgery. Pre operative duplex US showed no significant internal carotid artery stenosis. She underwent a CABG x 2 and MVR on 07/18/2014.  Brief Hospital Course:  The patient was extubated early the morning of post operative day one without difficulty. She remained afebrile and hemodynamically stable. She was weaned off New synephrine and Milrinone drips. She was initially AAI paced. Gordy Councilman, a line, chest tubes, and foley were removed early in the post operative course. Nephrology followed post op. She was started on a beta blocker. She was bradycardic then junctional.  BB was stopped. EPS consult was obtained with Dr. Rayann Heman. It was felt she had sinus node dysfunction. Bradycardia did resolve and she was started on low dose Coreg. However, as discussed with Dr. Rayann Heman, he recommends no Coreg at this time. We will re evaluate in the office as sinus dysfunction just recovering.She had anemia. Her last H and H was 10.2 and 31.6 She did not require a post op transfusion. She was weaned off the insulin drip. The patient's glucose remained well controlled. The patient's HGA1C pre op was 10. Once she was tolerating a diet,Insulin was restarted.The patient was felt surgically stable for transfer from the ICU to PCTU for further convalescence on 07/23/2014. She continues to progress with cardiac rehab. She was ambulating on room air. She has been tolerating a diet and has had a bowel movement. Epicardial pacing wires and chest tube sutures will be removed prior to discharge. The patient is felt surgically stable for discharge today.   Latest Vital Signs: Blood pressure 149/74, pulse 78, temperature 99.2 F (37.3 C), temperature source Oral, resp. rate 18, height 5' (1.524 m), weight 119 lb 14.9 oz (54.4 kg), last menstrual period 07/16/2014, SpO2 99 %.  Physical Exam: Cardiovascular: RRR Pulmonary: Clear to auscultation bilaterally;  no rales, wheezes, or rhonchi. Abdomen: Soft, non tender, bowel sounds present. Extremities: Trace lower extremity edema. Wounds: Clean and dry. No erythema or signs of infection.  Discharge Condition:Stable and discharged to home  Recent laboratory studies:  Lab Results  Component Value Date   WBC 9.0 07/23/2014   HGB 10.2* 07/23/2014   HCT 31.6* 07/23/2014   MCV 76.7* 07/23/2014   PLT 174 07/23/2014   Lab Results  Component Value Date   NA 133* 07/23/2014   K 3.9 07/23/2014   CL 97 07/23/2014   CO2 25 07/23/2014   CREATININE 9.69* 07/23/2014   GLUCOSE 257* 07/23/2014     Diagnostic Studies: Dg Chest Port 1v Same Day  07/22/2014   CLINICAL DATA:  Shortness of breath.  EXAM: PORTABLE CHEST - 1 VIEW SAME DAY  COMPARISON:  07/21/2014.  FINDINGS: Right IJ sheath is noted in stable position. Cardiomegaly. Prior cardiac valve replacement and CABG. Cardiomegaly with bilateral pulmonary alveolar infiltrates and pleural effusions. Pulmonary infiltrates have increased from prior exam. These findings are consistent with progressive congestive heart failure. Overlying pneumonia cannot be excluded  IMPRESSION: 1. Right IJ sheath in stable position.  2. Cardiomegaly. Prior CABG and cardiac valve replacement. Findings consistent with worsening congestive heart failure .   Electronically Signed   By: Marcello Moores  Register   On: 07/22/2014  13:27    Discharge Medications:   Medication List    STOP taking these medications        carvedilol 12.5 MG tablet  Commonly known as:  COREG      TAKE these medications        acetaminophen 325 MG tablet  Commonly known as:  TYLENOL  Take 650 mg by mouth every 4 (four) hours as needed for mild pain.     aspirin 325 MG tablet  Take 325 mg by mouth daily.     calcitRIOL 0.25 MCG capsule  Commonly known as:  ROCALTROL  Take 1 capsule (0.25 mcg total) by mouth daily.     calcium acetate 667 MG capsule  Commonly known as:  PHOSLO  Take 2,001 mg by  mouth 3 (three) times daily with meals.     gabapentin 100 MG capsule  Commonly known as:  NEURONTIN  Take 100 mg by mouth daily.     insulin glargine 100 UNIT/ML injection  Commonly known as:  LANTUS  Inject 10 Units into the skin at bedtime.     insulin lispro 100 UNIT/ML injection  Commonly known as:  HUMALOG  Inject 10-15 Units into the skin 3 (three) times daily before meals. Per sliding scale     losartan 100 MG tablet  Commonly known as:  COZAAR  Take 100 mg by mouth daily.     mirtazapine 15 MG tablet  Commonly known as:  REMERON  Take 15 mg by mouth at bedtime.     omeprazole 20 MG capsule  Commonly known as:  PRILOSEC  Take 1 capsule (20 mg total) by mouth daily as needed.     oxyCODONE 5 MG immediate release tablet  Commonly known as:  Oxy IR/ROXICODONE  Take 1-2 tablets (5-10 mg total) by mouth every 4 (four) hours as needed for severe pain.     simvastatin 5 MG tablet  Commonly known as:  ZOCOR  Take 5 mg by mouth at bedtime.       The patient has been discharged on:   1.Beta Blocker:  Yes [  ]                              No   [   ]                              If No, reason: Sinus node dysfunction, bradycardia  2.Ace Inhibitor/ARB: Yes [  x ]                                     No  [    ]                                     If No, reason:  3.Statin:   Yes [ x  ]                  No  [   ]                  If No, reason:  4.Ecasa:  Yes  [ x  ]  No   [   ]                  If No, reason:  Follow Up Appointments: Follow-up Information    Follow up with [name], MD.   Specialty:  Cardiology   Why:  Call for an appointment for 2 weeks   Contact information:   [address] [place] [address] [phone]       Follow up with Melrose Nakayama, MD On 08/30/2014.   Specialty:  Cardiothoracic Surgery   Why:  PA/LAT CXR (to be taken at [facility] which is in the same building as Dr.  Leonarda Salon office) on 08/30/2014 at 11:30 am;Appointment time is at 12:30 pm   Contact information:   [place] [address] [phone]       Follow up with [name], MD.   Specialty:  Internal Medicine   Why:  Call for a follow up appointment regarding further surveillance of HGA1C 10 and further diabetes management   Contact information:   [address], [place] [address] [phone]       Signed: Lars Pinks MPA-C 07/25/2014, 2:33 PM

## 2014-07-25 NOTE — Progress Notes (Addendum)
Patient had hypoglycemia episode last pm at 1908, Glucose 39 mg/dl, per RN note due to dialysis and Novolog 5 units meal coverage given 1 hour earlier for glucose in 70's. Lantus insulin held last pm due to the hypoglycemia, glucose this am 277 mg/dl. Basal still needs to be given in the future. The effects start hours later and mainly effect fasting glucose. Watch patient closely today. May see some high glucose readings. No recommendations today.   Thanks,  Tama Headings RN, MSN, [facility] Inpatient Diabetes Coordinator Team Pager [phone]

## 2014-07-25 NOTE — Progress Notes (Signed)
Removed epicardial pacing wires per md order.  Sutures were clipped and wires were removed without difficulty.  Pt had no c/o pain during procedure.  VS remained stable.  No drainage was visible from wire sites.  Chest tube sutures were left in to be removed in office.  Will continue to monitor.

## 2014-07-25 NOTE — Progress Notes (Signed)
I spoke with Dr. Rayann Heman. . Her sinus node has is just recovering. He recommends no beta blocker until re evaluated in office by cardiology. Therefore, will stop Coreg.

## 2014-07-25 NOTE — Discharge Instructions (Signed)
Activity: 1.May walk up steps                2.No lifting more than ten pounds for four weeks.                 3.No driving for four weeks.                4.Stop any activity that causes chest pain, shortness of breath, dizziness, sweating or excessive weakness.                5.Avoid straining.                6.Continue with your breathing exercises daily.  Diet: Diabetic diet and Renal diet  Wound Care: May shower.  Clean wounds with mild soap and water daily. Contact the office at [phone] if any problems arise.  Coronary Artery Bypass Grafting, Care After Refer to this sheet in the next few weeks. These instructions provide you with information on caring for yourself after your procedure. Your health care provider may also give you more specific instructions. Your treatment has been planned according to current medical practices, but problems sometimes occur. Call your health care provider if you have any problems or questions after your procedure. WHAT TO EXPECT AFTER THE PROCEDURE Recovery from surgery will be different for everyone. Some people feel well after 3 or 4 weeks, while for others it takes longer. After your procedure, it is typical to have the following:  Nausea and a lack of appetite.   Constipation.  Weakness and fatigue.   Depression or irritability.   Pain or discomfort at your incision site. HOME CARE INSTRUCTIONS  Take medicines only as directed by your health care provider. Do not stop taking medicines or start any new medicines without first checking with your health care provider.  Take your pulse as directed by your health care provider.  Perform deep breathing as directed by your health care provider. If you were given a device called an incentive spirometer, use it to practice deep breathing several times a day. Support your chest with a pillow or your arms when you take deep breaths or cough.  Keep incision areas clean, dry, and protected. Remove  or change any bandages (dressings) only as directed by your health care provider. You may have skin adhesive strips over the incision areas. Do not take the strips off. They will fall off on their own.  Check incision areas daily for any swelling, redness, or drainage.  If incisions were made in your legs, do the following:  Avoid crossing your legs.   Avoid sitting for long periods of time. Change positions every 30 minutes.   Elevate your legs when you are sitting.  Wear compression stockings as directed by your health care provider. These stockings help keep blood clots from forming in your legs.  Take showers once your health care provider approves. Until then, only take sponge baths. Pat incisions dry. Do not rub incisions with a washcloth or towel. Do not take baths, swim, or use a hot tub until your health care provider approves.  Eat foods that are high in fiber, such as raw fruits and vegetables, whole grains, beans, and nuts. Meats should be lean cut. Avoid canned, processed, and fried foods.  Drink enough fluid to keep your urine clear or pale yellow.  Weigh yourself every day. This helps identify if you are retaining fluid that may make your heart and lungs work harder.  Rest and limit activity as directed by your health care provider. You may be instructed to:  Stop any activity at once if you have chest pain, shortness of breath, irregular heartbeats, or dizziness. Get help right away if you have any of these symptoms.  Move around frequently for short periods or take short walks as directed by your health care provider. Increase your activities gradually. You may need physical therapy or cardiac rehabilitation to help strengthen your muscles and build your endurance.  Avoid lifting, pushing, or pulling anything heavier than 10 lb (4.5 kg) for at least 6 weeks after surgery.  Do not drive until your health care provider approves.  Ask your health care provider when  you may return to work.  Ask your health care provider when you may resume sexual activity.  Keep all follow-up visits as directed by your health care provider. This is important. SEEK MEDICAL CARE IF:  You have swelling, redness, increasing pain, or drainage at the site of an incision.  You have a fever.  You have swelling in your ankles or legs.  You have pain in your legs.   You gain 2 or more pounds (0.9 kg) a day.  You are nauseous or vomit.  You have diarrhea. SEEK IMMEDIATE MEDICAL CARE IF:  You have chest pain that goes to your jaw or arms.  You have shortness of breath.   You have a fast or irregular heartbeat.   You notice a "clicking" in your breastbone (sternum) when you move.   You have numbness or weakness in your arms or legs.  You feel dizzy or light-headed.  MAKE SURE YOU:  Understand these instructions.  Will watch your condition.  Will get help right away if you are not doing well or get worse. Document Released: 11/02/2004 Document Revised: 08/30/2013 Document Reviewed: 09/22/2012 [facility] Patient Information 2015 [place], [place]. This information is not intended to replace advice given to you by your health care provider. Make sure you discuss any questions you have with your health care provider.

## 2014-07-25 NOTE — Progress Notes (Addendum)
      [address]       [place] [address]             [phone]        7 Days Post-Op Procedure(s) (LRB): CORONARY ARTERY BYPASS GRAFTING (CABG)X2 LIMA-LAD; SVG-PD (N/A) MITRAL VALVE REPAIR (MVR) (N/A) TRANSESOPHAGEAL ECHOCARDIOGRAM (TEE) (N/A)  Subjective: Patient hopes to go home today  Objective: Vital signs in last 24 hours: Temp:  [98 F (36.7 C)-99.2 F (37.3 C)] 99.2 F (37.3 C) (03/28 0440) Pulse Rate:  [73-77] 77 (03/28 0440) Cardiac Rhythm:  [-] Normal sinus rhythm (03/27 2005) Resp:  [17] 17 (03/28 0440) BP: (121-143)/(72-73) 121/73 mmHg (03/28 0440) SpO2:  [95 %-100 %] 95 % (03/28 0440) Weight:  [119 lb 14.9 oz (54.4 kg)-128 lb 1.4 oz (58.1 kg)] 119 lb 14.9 oz (54.4 kg) (03/28 0650)  Pre op weight 53 kg Current Weight  07/25/14 119 lb 14.9 oz (54.4 kg)      Intake/Output from previous day: 03/27 0701 - 03/28 0700 In: 360 [P.O.:360] Out: -    Physical Exam:  Cardiovascular: RRR Pulmonary: Clear to auscultation bilaterally; no rales, wheezes, or rhonchi. Abdomen: Soft, non tender, bowel sounds present. Extremities: Trace lower extremity edema. Wounds: Clean and dry.  No erythema or signs of infection.  Lab Results: CBC: Recent Labs  07/23/14 0413  WBC 9.0  HGB 10.2*  HCT 31.6*  PLT 174   BMET:  Recent Labs  07/23/14 0414  NA 133*  K 3.9  CL 97  CO2 25  GLUCOSE 257*  BUN 49*  CREATININE 9.69*  CALCIUM 7.7*    PT/INR:  Lab Results  Component Value Date   INR 1.65* 07/18/2014   INR 1.09 07/14/2014   INR 1.10 06/23/2014   ABG:  INR: Will add last result for INR, ABG once components are confirmed Will add last 4 CBG results once components are confirmed  Assessment/Plan:  1. CV - Had sinus node dysfunction (previous rate into the 30's). Bradycardia resolved.SR in the 70's this am. Will not need PPM. Given Coreg 3.125 mg bid yesterday. Also, on Cozaar 100 daily.  2.  Pulmonary - On room air. Encourage  incentive spirometer 3. DM-CBGs 92/110/277. Had hypoglycemia early last evening.On Insulin 4.  ESRD-on PD 5. On backup external pacer at 45. Will disconnect and remove wires. 6. Remove central line 7. Will discuss discharge disposition with surgeon  ZIMMERMAN,DONIELLE MPA-C 07/25/2014,8:10 AM  Patient seen and examined, agree with above Home later today if OK with Cardiology  Remo Lipps C. Roxan Hockey, MD [facility] [phone]

## 2014-07-25 NOTE — Progress Notes (Addendum)
Patient Name: Priscilla [name] Date of Encounter: 07/25/2014  Active Problems:   S/P CABG x 2   Length of Stay: 7  SUBJECTIVE  Feels well. Walked halls without O2.. No further pacing necessary - NSR in 70s   CURRENT MEDS . aspirin EC  325 mg Oral Daily   Or  . aspirin  324 mg Per Tube Daily  . bisacodyl  10 mg Oral Daily   Or  . bisacodyl  10 mg Rectal Daily  . calcitRIOL  0.25 mcg Oral Daily  . calcium acetate  2,001 mg Oral TID WC  . carvedilol  3.125 mg Oral BID WC  . docusate sodium  200 mg Oral Daily  . gabapentin  100 mg Oral Daily  . insulin aspart  0-9 Units Subcutaneous TID WC  . insulin aspart  5 Units Subcutaneous TID WC  . insulin glargine  10 Units Subcutaneous QHS  . losartan  100 mg Oral Daily  . mirtazapine  15 mg Oral QHS  . pantoprazole  40 mg Oral Daily  . potassium chloride  20 mEq Oral Daily  . simvastatin  5 mg Oral QHS  . sodium chloride  3 mL Intravenous Q12H    OBJECTIVE   Intake/Output Summary (Last 24 hours) at 07/25/14 1314 Last data filed at 07/25/14 0730  Gross per 24 hour  Intake    240 ml  Output      0 ml  Net    240 ml   Filed Weights   07/24/14 0645 07/25/14 0440 07/25/14 0650  Weight: 124 lb 1.9 oz (56.3 kg) 128 lb 1.4 oz (58.1 kg) 119 lb 14.9 oz (54.4 kg)    PHYSICAL EXAM Filed Vitals:   07/25/14 0650 07/25/14 1050 07/25/14 1103 07/25/14 1145  BP:  157/66 156/83 149/74  Pulse:  75 77 78  Temp:      TempSrc:      Resp:  16 16 18   Height:      Weight: 119 lb 14.9 oz (54.4 kg)     SpO2:  99% 98% 99%   General: Alert, oriented x3, no distress Head: no evidence of trauma, PERRL, EOMI, no exophtalmos or lid lag, no myxedema, no xanthelasma; normal ears, nose and oropharynx Neck: normal jugular venous pulsations and no hepatojugular reflux; brisk carotid pulses without delay and no carotid bruits Chest: clear to auscultation, no signs of consolidation by percussion or palpation, normal fremitus, symmetrical and  full respiratory excursions Cardiovascular: normal position and quality of the apical impulse, regular rhythm, normal first and second heart sounds, probable  pericardial rub, no gallops, 2/6 holosystolic murmur Abdomen: no tenderness or distention, no masses by palpation, no abnormal pulsatility or arterial bruits, normal bowel sounds, no hepatosplenomegaly Extremities: no clubbing, cyanosis or edema; 2+ radial, ulnar and brachial pulses bilaterally; 2+ right femoral, posterior tibial and dorsalis pedis pulses; 2+ left femoral, posterior tibial and dorsalis pedis pulses; no subclavian or femoral bruits Neurological: grossly nonfocal  LABS  CBC  Recent Labs  07/23/14 0413  WBC 9.0  HGB 10.2*  HCT 31.6*  MCV 76.7*  PLT [id number]   Basic Metabolic Panel  Recent Labs  07/23/14 0414  NA 133*  K 3.9  CL 97  CO2 25  GLUCOSE 257*  BUN 49*  CREATININE 9.69*  CALCIUM 7.7*  PHOS 5.2*   Liver Function Tests  Recent Labs  07/23/14 0414  ALBUMIN 1.8*    Radiology Studies Imaging results have been reviewed and No results found.  TELE NSR, no further pacing   ASSESSMENT AND PLAN  Recovering well from CABG. Transient sinus bradycardia postop, resolved holding beta blocker. Will try to gradually reintroduce  Carvedilol - since she is hypertensive will increase to 6.25 mg bid. She does not need a pacemaker. She has lost most of the extra fluid she gained periop (office visit 02/25 weight 123 lb) via PD exchanges Anticipate DC today- she is OK from cardiac standpoint Note severe hypoalbuminemia, but she seems to be healing her surgical wounds very quickly. Plan to reevaluate LVEF in 3 months and decide on ICD therapy (currently not indicated).  Hence Derrick Martinique MD, [facility]   07/25/2014 1:14 PM

## 2014-07-25 NOTE — Progress Notes (Signed)
Pt d/c home.  Alert and oriented x4.  No c/o pain.  Pacing wires were removed.  CT sutures in place.  Pt was given education regarding wound care, medications, activity, and follow-up care and appointments.  Pt was given prescriptions.  Pt verbalized understanding.  IV D/Cd.  Tele D/Cd.

## 2014-08-02 ENCOUNTER — Ambulatory Visit: Payer: PRIVATE HEALTH INSURANCE

## 2014-08-02 DIAGNOSIS — Z951 Presence of aortocoronary bypass graft: Secondary | ICD-10-CM

## 2014-08-02 DIAGNOSIS — G8918 Other acute postprocedural pain: Secondary | ICD-10-CM

## 2014-08-02 DIAGNOSIS — Z4802 Encounter for removal of sutures: Secondary | ICD-10-CM

## 2014-08-02 MED ORDER — OXYCODONE HCL 5 MG PO TABS: 5.0000 mg | ORAL_TABLET | Freq: Four times a day (QID) | ORAL | Status: DC | PRN

## 2014-08-02 NOTE — Progress Notes (Signed)
Removed 2 sutures from chest tube sites. No signs of infection and patient tolerated well. Refilled pain medication.

## 2014-08-29 ENCOUNTER — Other Ambulatory Visit: Payer: Self-pay | Admitting: Thoracic Surgery (Cardiothoracic Vascular Surgery)

## 2014-08-29 DIAGNOSIS — Z951 Presence of aortocoronary bypass graft: Secondary | ICD-10-CM

## 2014-08-30 ENCOUNTER — Ambulatory Visit
Admission: RE | Admit: 2014-08-30 | Discharge: 2014-08-30 | Disposition: A | Discharge status: Home / Self Care | Payer: PRIVATE HEALTH INSURANCE | Source: Ambulatory Visit | Attending: Thoracic Surgery (Cardiothoracic Vascular Surgery) | Admitting: Thoracic Surgery (Cardiothoracic Vascular Surgery)

## 2014-08-30 ENCOUNTER — Ambulatory Visit (INDEPENDENT_AMBULATORY_CARE_PROVIDER_SITE_OTHER): Payer: Self-pay | Admitting: Thoracic Surgery (Cardiothoracic Vascular Surgery)

## 2014-08-30 ENCOUNTER — Encounter: Payer: Self-pay | Admitting: Thoracic Surgery (Cardiothoracic Vascular Surgery)

## 2014-08-30 VITALS — BP 156/90 | HR 91 | Resp 20 | Ht 60.0 in | Wt 116.0 lb

## 2014-08-30 DIAGNOSIS — Z9889 Other specified postprocedural states: Secondary | ICD-10-CM

## 2014-08-30 DIAGNOSIS — Z951 Presence of aortocoronary bypass graft: Secondary | ICD-10-CM

## 2014-08-30 DIAGNOSIS — I429 Cardiomyopathy, unspecified: Secondary | ICD-10-CM

## 2014-08-30 DIAGNOSIS — I2510 Atherosclerotic heart disease of native coronary artery without angina pectoris: Secondary | ICD-10-CM

## 2014-08-30 DIAGNOSIS — I340 Nonrheumatic mitral (valve) insufficiency: Secondary | ICD-10-CM

## 2014-08-30 NOTE — Progress Notes (Signed)
[address]       [place] [address]             [phone]       HPI:  Priscilla [name] returns today for scheduled postoperative follow-up visit.  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 She had a positive stress test. Catheterization revealed severe three-vessel disease and moderate to severe mitral regurgitation. There also was moderate to severe tricuspid regurgitation by echo.  She underwent coronary bypass grafting 2 and mitral valve repair on 07/18/2014. She had only moderate tricuspid regurgitation on her pre-bypass TEE and that was improved on the post-bypass study. She did well postoperatively and was discharged home on postoperative day #7.  She says that she was very fatigued and short of breath for about the first month postoperatively. She has noticed a significant improvement over the past 2 weeks. She now can go up and down stairs easily. She's not having any problems with swelling. She is no longer having to take Pain medication. She does complain of some irritation when her shirt rubs on the chest. She says she lost about 17 pounds due to a poor appetite. Her appetite has improved over the past couple of weeks. She is tolerating dialysis well. She says her blood sugars been well-controlled.  Past Medical History  Diagnosis Date  . H/O kidney transplant   . Pancreas transplanted   . Diabetes mellitus without complication   . Hypertension   . Renal disorder   . Anemia   . GERD (gastroesophageal reflux disease)   . End stage renal disease   . Hyperlipidemia   . Cardiomyopathy, ischemic 06/24/2014  . 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 S/P mitral valve repair 07/18/14      Current Outpatient Prescriptions  Medication Sig Dispense Refill  . acetaminophen (TYLENOL) 325 MG tablet Take 650 mg by mouth every 4 (four)  hours as needed for mild pain.    Marland Kitchen aspirin 325 MG tablet Take 325 mg by mouth daily.     . calcitRIOL (ROCALTROL) 0.25 MCG capsule Take 1 capsule (0.25 mcg total) by mouth daily. 30 capsule 1  . calcium acetate (PHOSLO) 667 MG capsule Take 2,001 mg by mouth 3 (three) times daily with meals.    . gabapentin (NEURONTIN) 100 MG capsule Take 100 mg by mouth daily.    . insulin glargine (LANTUS) 100 UNIT/ML injection Inject 10 Units into the skin at bedtime.     . insulin lispro (HUMALOG) 100 UNIT/ML injection Inject 10-15 Units into the skin 3 (three) times daily before meals. Per sliding scale    . losartan (COZAAR) 100 MG tablet Take 100 mg by mouth daily.    . mirtazapine (REMERON) 15 MG tablet Take 15 mg by mouth at bedtime.    Marland Kitchen omeprazole (PRILOSEC) 20 MG capsule Take 1 capsule (20 mg total) by mouth daily as needed. 30 capsule   . oxyCODONE (OXY IR/ROXICODONE) 5 MG immediate release tablet Take 1-2 tablets (5-10 mg total) by mouth every 6 (six) hours as needed for severe pain. 40 tablet 0  . simvastatin (ZOCOR) 5 MG tablet Take 5 mg by mouth at bedtime.      No current facility-administered medications for this visit.    Physical Exam BP 156/90 mmHg  Pulse 91  Resp 20  Ht 5' (1.524 m)  Wt  116 lb (52.617 kg)  BMI 22.65 kg/m2  SpO67 41% 44 year old woman in no acute distress Well-developed well-nourished Alert and oriented 3 with no focal neurologic deficit Cardiac regular rate and rhythm normal S1 and S2, no murmur Lungs slightly diminished at left base, otherwise clear Sternum stable, mild tenderness, no erythema  Diagnostic Tests: CHEST 2 VIEW  COMPARISON: Portable chest x-ray of July 22, 2014  FINDINGS: The lungs are adequately inflated. There remains a small left pleural effusion. The right pleural effusion has resolved. The pulmonary interstitial markings remain mildly increased but have significantly improved since the previous study. The cardiac silhouette is  normal in size. There are 6 intact sternal wires.  IMPRESSION: Overall improvement in the appearance of the thorax with resolution of the right pleural effusion, decreased volume of left pleural effusion, and decreased pulmonary interstitial edema.   Electronically Signed  By: David Martinique M.D.  On: 08/30/2014 12:38  Impression: 44 year old woman who is now about 6 weeks out from coronary bypass grafting 2 and mitral valve repair. She is doing well at this point in time. She has no signs or symptoms of congestive failure and is not having any angina. She has only mild incisional discomfort is not requiring narcotics at this time.  Her exercise tolerance is improving. At this point there are no restrictions on her activities, but she was cautioned that she will need to increase her activities gradually over time.  She is anxious to return to work and asked if she can return to work on May 16. I think she'll be fine to do that, but I advised her to do only half days for the first 2 weeks to ease into it.  I encouraged her to do cardiac rehabilitation to get into a regular exercise routine. She will talk to Dr. Sallyanne Kuster about that. She has not yet seen him since surgery. We will help her get an appointment scheduled to follow-up with him.  Plan:  Follow-up with Dr. Sallyanne Kuster  I will be happy to see her back any time if I can be of any further assistance with her care.  Priscilla Nakayama, MD [facility] [phone]

## 2014-09-19 ENCOUNTER — Encounter: Payer: Self-pay | Admitting: Cardiovascular Disease

## 2014-10-14 ENCOUNTER — Ambulatory Visit: Payer: PRIVATE HEALTH INSURANCE | Admitting: Cardiovascular Disease

## 2014-10-20 ENCOUNTER — Ambulatory Visit (INDEPENDENT_AMBULATORY_CARE_PROVIDER_SITE_OTHER): Payer: PRIVATE HEALTH INSURANCE | Admitting: Cardiovascular Disease

## 2014-10-20 VITALS — BP 136/74 | HR 91 | Ht 60.0 in | Wt 121.2 lb

## 2014-10-20 DIAGNOSIS — I255 Ischemic cardiomyopathy: Secondary | ICD-10-CM | POA: Diagnosis not present

## 2014-10-20 DIAGNOSIS — I2510 Atherosclerotic heart disease of native coronary artery without angina pectoris: Secondary | ICD-10-CM | POA: Diagnosis not present

## 2014-10-20 DIAGNOSIS — I5040 Unspecified combined systolic (congestive) and diastolic (congestive) heart failure: Secondary | ICD-10-CM

## 2014-10-20 DIAGNOSIS — Z951 Presence of aortocoronary bypass graft: Secondary | ICD-10-CM

## 2014-10-20 MED ORDER — CARVEDILOL 6.25 MG PO TABS: ORAL_TABLET | ORAL | Status: DC

## 2014-10-20 NOTE — Progress Notes (Signed)
Patient ID: Priscilla [name], female   DOB: November 30, 1970, 44 y.o.   MRN: [id number]      Cardiology Office Note   Date:  10/22/2014   ID:  Priscilla [name], DOB April 16, 1971, MRN [id number]  PCP:  [name], MD  Cardiologist:   [name], MD   Chief Complaint  Patient presents with  . Follow-up    no chest discomfort, has swelling and some pain. no corncern      History of Present Illness: Priscilla [name] is a 44 y.o. female who presents for  Follow-up after 2 vessel coronary bypass surgery (LIMA-LAD and SVG-PDA) and mitral valve repair (#28 Memo ring) and PFO closure performed by Dr. Roxan Hockey on March 21. She also has Type 1 diabetes mellitus and end-stage renal disease with a history of failed kidney and pancreas transplant, currently on peritoneal dialysis.  Coronary disease was discovered during evaluation for another transplant She has made a remarkably swift recovery and is back at work.  She had prolonged need for temporary pacing after bypass surgery but did not require permanent pacemaker as her heart rate recovered.   as always she is very positive and today has no complaints at all.  She felt poorly for the first couple weeks after bypass, but after that she has noticed a remarkable increase in exercise tolerance and she feels better than she did before surgery.  He wants to start exercising.   before surgery LVEF was 35-40 percent with inferior wall hypokinesis moderate left ventricular dilatation and  Her to severe mitral insufficiency. There was also moderate to severe TR but the time of surgery this is only moderate and it improved on the post bypass study. The tricuspid valve was not treated surgically.  Past Medical History  Diagnosis Date  . H/O kidney transplant   . Pancreas transplanted   . Diabetes mellitus without complication   . Hypertension   . Renal disorder   . Anemia   . GERD (gastroesophageal reflux disease)   . End stage  renal disease   . Hyperlipidemia   . Cardiomyopathy, ischemic 06/24/2014  . 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 S/P mitral valve repair 07/18/14    Past Surgical History  Procedure Laterality Date  . 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  Pancreas left mid abdomen  . Cesarean section    . Laparoscopic insertion peritoneal catheter  07/18/2008    Dr Excell Seltzer  . Laparoscopic repositioning capd catheter  09/20/202010    Dr Excell Seltzer  . Laparoscopic repositioning capd catheter  11/08/2008    Dr Excell Seltzer  . Omentectomy  11/08/2008    Dr Excell Seltzer  . Laparoscopic repositioning capd catheter  10/03/2009    Unplugging CAPD catheter Dr Excell Seltzer  . Insertion of dialysis catheter  September 28, 2012    Right upper chest  . Capd insertion N/A 09/08/2013    Procedure: Laparoscopic CAPD peritoneal dialysis catheter placement, possible lysis of adhesions   ;  Surgeon: Adin Hector, MD;  Location: [facility];  Service: General;  Laterality: N/A;  . Left heart catheterization with coronary angiogram N/A 06/28/2014    Procedure: LEFT HEART CATHETERIZATION WITH CORONARY ANGIOGRAM;  Surgeon: [name], MD;  Location: [facility];  Service: Cardiovascular;  Laterality: N/A;  . Cardiac catheterization      06/2014  . Coronary artery bypass graft N/A 07/18/2014    Procedure: CORONARY ARTERY BYPASS GRAFTING (CABG)X2 LIMA-LAD; SVG-PD;  Surgeon: Melrose Nakayama, MD;  Location: [facility];  Service: Open Heart Surgery;  Laterality: N/A;  . Mitral valve repair N/A 07/18/2014    Procedure: MITRAL VALVE REPAIR (MVR);  Surgeon: Melrose Nakayama, MD;  Location: [facility];  Service: Open Heart Surgery;  Laterality: N/A;  . Tee without cardioversion N/A 07/18/2014    Procedure: TRANSESOPHAGEAL ECHOCARDIOGRAM (TEE);  Surgeon: Melrose Nakayama, MD;  Location: [facility];  Service: Open Heart Surgery;  Laterality: N/A;     Current Outpatient Prescriptions  Medication Sig Dispense Refill  . acetaminophen (TYLENOL)  325 MG tablet Take 650 mg by mouth every 4 (four) hours as needed for mild pain.    Marland Kitchen aspirin 325 MG tablet Take 325 mg by mouth daily.     . calcitRIOL (ROCALTROL) 0.25 MCG capsule Take 1 capsule (0.25 mcg total) by mouth daily. 30 capsule 1  . calcium acetate (PHOSLO) 667 MG capsule Take 2,001 mg by mouth 3 (three) times daily with meals.    . gabapentin (NEURONTIN) 100 MG capsule Take 100 mg by mouth daily.    . hydrALAZINE (APRESOLINE) 10 MG tablet Take 10 mg by mouth 3 (three) times daily.    . insulin glargine (LANTUS) 100 UNIT/ML injection Inject 10 Units into the skin at bedtime.     . insulin lispro (HUMALOG) 100 UNIT/ML injection Inject 10-15 Units into the skin 3 (three) times daily before meals. Per sliding scale    . losartan (COZAAR) 100 MG tablet Take 100 mg by mouth daily.    . mirtazapine (REMERON) 15 MG tablet Take 15 mg by mouth at bedtime.    Marland Kitchen omeprazole (PRILOSEC) 20 MG capsule Take 1 capsule (20 mg total) by mouth daily as needed. 30 capsule   . oxyCODONE (OXY IR/ROXICODONE) 5 MG immediate release tablet Take 1-2 tablets (5-10 mg total) by mouth every 6 (six) hours as needed for severe pain. 40 tablet 0  . simvastatin (ZOCOR) 5 MG tablet Take 5 mg by mouth at bedtime.     . carvedilol (COREG) 6.25 MG tablet TAKE 1/2 TABLET TWICE A DAY X 3 WEEKS THEN INCREASE TO 1 TABLET TWICE A DAY 60 tablet 6   No current facility-administered medications for this visit.    Allergies:   Pork-derived products    Social History:  The patient  reports that she has never smoked. She has never used smokeless tobacco. She reports that she does not drink alcohol or use illicit drugs.   Family History:  The patient's family history includes CAD in her mother; COPD in her father; Stroke in her mother.    ROS:  Please see the history of present illness.    Otherwise, review of systems positive for none.   All other systems are reviewed and negative.    PHYSICAL EXAM: VS:  BP 136/74 mmHg   Pulse 91  Ht 5' (1.524 m)  Wt 121 lb 3.2 oz (54.976 kg)  BMI 23.67 kg/m2 , BMI Body mass index is 23.67 kg/(m^2).  General: Alert, oriented x3, no distress Head: no evidence of trauma, PERRL, EOMI, no exophtalmos or lid lag, no myxedema, no xanthelasma; normal ears, nose and oropharynx Neck: Normal jugular venous pulsations without prominent V waves; brisk carotid pulses without delay and no carotid bruits Chest: clear to auscultation, no signs of consolidation by percussion or palpation, normal fremitus, symmetrical and full respiratory excursions,  Well-healed sternotomy Cardiovascular: normal position and quality of the apical impulse, regular rhythm, normal first and widely split second heart sounds. Multiple additional abnormal sounds are  heard. I think there is a fourth heart sound. 2/6 holosystolic murmur at the left lower sternal border consistent with tricuspid insufficiency.  No diastolic murmur/rumbles.  No gallop or rub Abdomen: no tenderness or distention, no masses by palpation, no abnormal pulsatility or arterial bruits, normal bowel sounds, no hepatosplenomegaly Extremities: no clubbing, cyanosis or edema; 2+ radial, ulnar and brachial pulses bilaterally; 2+ right femoral, posterior tibial and dorsalis pedis pulses; 2+ left femoral, posterior tibial and dorsalis pedis pulses; no subclavian or femoral bruits Neurological: grossly nonfocal nonfocal Psych: euthymic mood, full affect   EKG:  EKG is not ordered today.   Recent Labs: 07/14/2014: ALT 15 07/19/2014: Magnesium 1.7 07/23/2014: BUN 49*; Creatinine, Ser 9.69*; Hemoglobin 10.2*; Platelets 174; Potassium 3.9; Sodium 133*    Lipid Panel No results found for: CHOL, TRIG, HDL, CHOLHDL, VLDL, LDLCALC, LDLDIRECT    Wt Readings from Last 3 Encounters:  10/20/14 121 lb 3.2 oz (54.976 kg)  08/30/14 116 lb (52.617 kg)  07/25/14 119 lb 14.9 oz (54.4 kg)      Other studies Reviewed: Additional studies/ records that were  reviewed today include:  Records regarding postop course and recent office visit with Dr. Roxan Hockey  ASSESSMENT AND PLAN:  1.  CAD s/p CABG (LIMA-LAD, SVG-PDA)-  She had evidence of inferior wall infarction and probably anteroapical ischemia , repeat echo to see how much improvement there has been a left ventricular systolic function. Suspect we will see residual inferior wall hypokinesis but hope to see marked improvement in anterior wall motion and overall EF  2.  Mitral regurgitation status post annuloplasty repair, reevaluate by echo  3.  Tricuspid regurgitation, substantially diminished by physical exam  4.  Congestive heart failure, systolic. Clinically euvolemic. On ARB.  Beta blockers were withheld due to postop bradycardia, but this is no longer an issue. Plan to gradually increase the dose of carvedilol.  Advised her that she might deteriorate temporarily from symptom point of view as the dose of carvedilol is increased. She should call us if she develops more than mild dyspnea, mild fatigue, mild dizziness. Otherwise she should continue taking the medication, anticipating that his symptoms will improve after about a week.  5.  End-stage renal disease on peritoneal dialysis which probably assists treatment of heart failure. Anuric.  6.  Type 1 diabetes mellitus   if there is substantial improvement in cardiac performance, I think this will make her more appealing candidate for repeat consideration for kidney transplant.  And if left ventricular systolic function remains low, as long as her heart failure is well compensated she may still be an appropriate candidate for transplantation.   Current medicines are reviewed at length with the patient today.  The patient does not have concerns regarding medicines.  The following changes have been made:  no change  Labs/ tests ordered today include:  Orders Placed This Encounter  Procedures  . EKG 12-Lead  . ECHOCARDIOGRAM COMPLETE     Patient Instructions  Medication Instructions:   START CARVEDILOL 6.25MG  TABLETS - TAKE 1/2 TABLET TWICE A DAY X 3 WEEKS THEN INCREASE TO 1 TABLET TWICE A DAY.  Labwork:  NONE  Testing/Procedures:  Your physician has requested that you have an echocardiogram. Echocardiography is a painless test that uses sound waves to create images of your heart. It provides your doctor with information about the size and shape of your heart and how well your heart's chambers and valves are working. This procedure takes approximately one hour. There are no  restrictions for this procedure.    Follow-Up:  6 WEEKS WITH A PA/NP  Dr. Sallyanne Kuster recommends that you schedule a follow-up appointment in: 1ST AVAILABLE          Signed, [name], MD  10/22/2014 7:07 PM    [name], MD, [facility] [phone] office [phone] pager

## 2014-10-20 NOTE — Patient Instructions (Signed)
Medication Instructions:   START CARVEDILOL 6.25MG  TABLETS - TAKE 1/2 TABLET TWICE A DAY X 3 WEEKS THEN INCREASE TO 1 TABLET TWICE A DAY.  Labwork:  NONE  Testing/Procedures:  Your physician has requested that you have an echocardiogram. Echocardiography is a painless test that uses sound waves to create images of your heart. It provides your doctor with information about the size and shape of your heart and how well your heart's chambers and valves are working. This procedure takes approximately one hour. There are no restrictions for this procedure.    Follow-Up:  6 WEEKS WITH A PA/NP  Dr. Sallyanne Kuster recommends that you schedule a follow-up appointment in: 1ST AVAILABLE

## 2014-10-22 ENCOUNTER — Encounter: Payer: Self-pay | Admitting: Cardiovascular Disease

## 2014-10-25 ENCOUNTER — Ambulatory Visit (HOSPITAL_COMMUNITY)
Admission: RE | Admit: 2014-10-25 | Discharge: 2014-10-25 | Disposition: A | Discharge status: Home / Self Care | Payer: PRIVATE HEALTH INSURANCE | Source: Ambulatory Visit | Attending: Cardiovascular Disease | Admitting: Cardiovascular Disease

## 2014-10-25 DIAGNOSIS — I5040 Unspecified combined systolic (congestive) and diastolic (congestive) heart failure: Secondary | ICD-10-CM

## 2014-10-25 DIAGNOSIS — I509 Heart failure, unspecified: Secondary | ICD-10-CM | POA: Insufficient documentation

## 2014-11-22 ENCOUNTER — Telehealth: Payer: Self-pay | Admitting: Physician Assistant

## 2014-11-23 NOTE — Telephone Encounter (Signed)
Close encounter 

## 2014-11-28 ENCOUNTER — Ambulatory Visit (INDEPENDENT_AMBULATORY_CARE_PROVIDER_SITE_OTHER): Payer: PRIVATE HEALTH INSURANCE | Admitting: Physician Assistant

## 2014-11-28 ENCOUNTER — Encounter: Payer: Self-pay | Admitting: Physician Assistant

## 2014-11-28 VITALS — BP 134/90 | HR 80 | Ht 60.0 in | Wt 117.2 lb

## 2014-11-28 DIAGNOSIS — I340 Nonrheumatic mitral (valve) insufficiency: Secondary | ICD-10-CM | POA: Diagnosis not present

## 2014-11-28 DIAGNOSIS — I255 Ischemic cardiomyopathy: Secondary | ICD-10-CM | POA: Diagnosis not present

## 2014-11-28 DIAGNOSIS — I10 Essential (primary) hypertension: Secondary | ICD-10-CM | POA: Insufficient documentation

## 2014-11-28 DIAGNOSIS — I2510 Atherosclerotic heart disease of native coronary artery without angina pectoris: Secondary | ICD-10-CM

## 2014-11-28 NOTE — Progress Notes (Signed)
Patient ID: Priscilla [name], female   DOB: 03/26/1971, 44 y.o.   MRN: [id number]    Date:  11/28/2014   ID:  Priscilla [name], DOB [birth year], MRN [id number]  PCP:  [name], MD  Primary Cardiologist:  [facility]   Chief Complaint  Patient presents with  . 6 week rov    patienr reports no problems since last visit     History of Present Illness: Priscilla [name] is a 44 y.o. female who presents forfollow-up after 2 vessel coronary bypass surgery (LIMA-LAD and SVG-PDA) and mitral valve repair (#28 Memo ring) and PFO closure performed by Dr. Roxan Hockey on March 21. She also has Type 1 diabetes mellitus and end-stage renal disease with a history of failed kidney and pancreas transplant, currently on peritoneal dialysis. Coronary disease was discovered during evaluation for another transplant She has made a remarkably swift recovery and is back at work. She had prolonged need for temporary pacing after bypass surgery but did not require permanent pacemaker as her heart rate recovered.  Before surgery LVEF was 35-40 percent with inferior wall hypokinesis moderate left ventricular dilatation and Her to severe mitral insufficiency. There was also moderate to severe TR but the time of surgery this is only moderate and it improved on the post bypass study. The tricuspid valve was not treated surgically.  Echocardiogram 10/25/2014 revealed an ejection fraction of 55-60%, septal hypokinesis, mitral valve with borderline mild stenosis, mild tricuspid regurgitation and mild pulmonic regurgitation. PA pressure 34 mmHg.    Patient presented for another follow-up evaluation reports doing quite well. Her family took her out to dinner yesterday to celebrate her birthday which was [birth year]. She currently denies nausea, vomiting, fever, chest pain, shortness of breath, orthopnea, dizziness, PND, cough, congestion, abdominal pain, hematochezia, melena, lower extremity edema,  claudication.   Wt Readings from Last 3 Encounters:  11/28/14 117 lb 3.2 oz (53.162 kg)  10/20/14 121 lb 3.2 oz (54.976 kg)  08/30/14 116 lb (52.617 kg)     Past Medical History  Diagnosis Date  . H/O kidney transplant   . Pancreas transplanted   . Diabetes mellitus without complication   . Hypertension   . Renal disorder   . Anemia   . GERD (gastroesophageal reflux disease)   . End stage renal disease   . Hyperlipidemia   . Cardiomyopathy, ischemic 06/24/2014  . 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 S/P mitral valve repair 07/18/14    Current Outpatient Prescriptions  Medication Sig Dispense Refill  . acetaminophen (TYLENOL) 325 MG tablet Take 650 mg by mouth every 4 (four) hours as needed for mild pain.    Marland Kitchen aspirin 325 MG tablet Take 325 mg by mouth daily.     . calcitRIOL (ROCALTROL) 0.25 MCG capsule Take 1 capsule (0.25 mcg total) by mouth daily. 30 capsule 1  . calcium acetate (PHOSLO) 667 MG capsule Take 2,001 mg by mouth 3 (three) times daily with meals.    . carvedilol (COREG) 6.25 MG tablet TAKE 1/2 TABLET TWICE A DAY X 3 WEEKS THEN INCREASE TO 1 TABLET TWICE A DAY 60 tablet 6  . gabapentin (NEURONTIN) 100 MG capsule Take 100 mg by mouth daily.    . hydrALAZINE (APRESOLINE) 10 MG tablet Take 10 mg by mouth 3 (three) times daily.    . insulin glargine (LANTUS) 100 UNIT/ML injection Inject 10 Units into the skin at bedtime.     . insulin lispro (HUMALOG) 100 UNIT/ML injection Inject 10-15 Units into the skin 3 (three)  times daily before meals. Per sliding scale    . losartan (COZAAR) 100 MG tablet Take 100 mg by mouth daily.    . mirtazapine (REMERON) 15 MG tablet Take 15 mg by mouth at bedtime.    Marland Kitchen omeprazole (PRILOSEC) 20 MG capsule Take 1 capsule (20 mg total) by mouth daily as needed. 30 capsule   . oxyCODONE (OXY IR/ROXICODONE) 5 MG immediate release tablet Take 1-2 tablets (5-10 mg total) by mouth every 6 (six) hours as needed for severe pain. 40 tablet 0  . simvastatin (ZOCOR)  5 MG tablet Take 5 mg by mouth at bedtime.      No current facility-administered medications for this visit.    Allergies:    Allergies  Allergen Reactions  . Pork-Derived Products Other (See Comments)    Pt is [place]    Social History:  The patient  reports that she has never smoked. She has never used smokeless tobacco. She reports that she does not drink alcohol or use illicit drugs.   Family history:   Family History  Problem Relation Age of Onset  . CAD Mother   . Stroke Mother   . COPD Father     ROS:  Please see the history of present illness.  All other systems reviewed and negative.   PHYSICAL EXAM: VS:  BP 134/90 mmHg  Pulse 80  Ht 5' (1.524 m)  Wt 117 lb 3.2 oz (53.162 kg)  BMI 22.89 kg/m2 Well nourished, well developed, in no acute distress HEENT: Pupils are equal round react to light accommodation extraocular movements are intact.  Neck: no JVDNo cervical lymphadenopathy. Cardiac: Regular rate and rhythm,   S3No murmur Lungs:  clear to auscultation bilaterally, no wheezing, rhonchi or rales Abd: soft, nontender, positive bowel sounds all quadrants, no hepatosplenomegaly Ext: no lower extremity edema.  2+ radial nonpalpable distal pulses however, feet are warm  Skin: warm and dry Neuro:  Grossly normal    ASSESSMENT AND PLAN:  Problem List Items Addressed This Visit    Mitral regurgitation    Recent echo shows no further regurgitation since mitral valve repair      Essential hypertension    Mild elevation of blood pressure today. No changes in medications.      Coronary artery disease involving native coronary artery of native heart without angina pectoris    No complaints of angina continue aspirin, Coreg and statin.      Cardiomyopathy, ischemic - Primary    She is a normalization of ejection fraction. She appears euvolemic.

## 2014-11-28 NOTE — Assessment & Plan Note (Signed)
Mild elevation of blood pressure today. No changes in medications.

## 2014-11-28 NOTE — Patient Instructions (Signed)
Your physician recommends that you keep your schedule a follow-up appointment in September. Continue all medications as prescribed.

## 2014-11-28 NOTE — Assessment & Plan Note (Signed)
She is a normalization of ejection fraction. She appears euvolemic.

## 2014-11-28 NOTE — Assessment & Plan Note (Signed)
Recent echo shows no further regurgitation since mitral valve repair

## 2014-11-28 NOTE — Assessment & Plan Note (Signed)
No complaints of angina continue aspirin, Coreg and statin.

## 2014-11-29 ENCOUNTER — Ambulatory Visit: Payer: PRIVATE HEALTH INSURANCE | Admitting: Physician Assistant

## 2014-12-06 ENCOUNTER — Encounter: Payer: Self-pay | Admitting: *Deleted

## 2014-12-09 ENCOUNTER — Ambulatory Visit: Payer: PRIVATE HEALTH INSURANCE | Admitting: Physician Assistant

## 2014-12-09 ENCOUNTER — Telehealth: Payer: Self-pay | Admitting: Physician Assistant

## 2014-12-09 NOTE — Telephone Encounter (Signed)
Cancel last minute

## 2014-12-09 NOTE — Telephone Encounter (Signed)
Did she cancel last minute or no show?

## 2014-12-09 NOTE — Telephone Encounter (Signed)
Cecille Rubin, Do you want to charge for last minute cancellation?

## 2014-12-13 NOTE — Telephone Encounter (Signed)
I spoke to Amy--no charge.

## 2014-12-29 ENCOUNTER — Ambulatory Visit: Payer: PRIVATE HEALTH INSURANCE | Admitting: Physician Assistant

## 2015-01-13 ENCOUNTER — Ambulatory Visit (INDEPENDENT_AMBULATORY_CARE_PROVIDER_SITE_OTHER): Payer: PRIVATE HEALTH INSURANCE | Admitting: Physician Assistant

## 2015-01-13 ENCOUNTER — Other Ambulatory Visit (INDEPENDENT_AMBULATORY_CARE_PROVIDER_SITE_OTHER): Payer: PRIVATE HEALTH INSURANCE

## 2015-01-13 ENCOUNTER — Encounter: Payer: Self-pay | Admitting: Physician Assistant

## 2015-01-13 ENCOUNTER — Telehealth: Payer: Self-pay | Admitting: *Deleted

## 2015-01-13 VITALS — BP 120/70 | HR 72 | Ht 60.0 in | Wt 113.8 lb

## 2015-01-13 DIAGNOSIS — R197 Diarrhea, unspecified: Secondary | ICD-10-CM

## 2015-01-13 DIAGNOSIS — N184 Chronic kidney disease, stage 4 (severe): Secondary | ICD-10-CM | POA: Diagnosis not present

## 2015-01-13 LAB — CBC WITH DIFFERENTIAL/PLATELET
BASOS PCT: 0.4 % (ref 0.0–3.0)
Basophils Absolute: 0 10*3/uL (ref 0.0–0.1)
EOS PCT: 2.3 % (ref 0.0–5.0)
Eosinophils Absolute: 0.2 10*3/uL (ref 0.0–0.7)
HEMATOCRIT: 36.3 % (ref 36.0–46.0)
Hemoglobin: 11.7 g/dL — ABNORMAL LOW (ref 12.0–15.0)
LYMPHS PCT: 21.3 % (ref 12.0–46.0)
Lymphs Abs: 1.6 10*3/uL (ref 0.7–4.0)
MCHC: 32.1 g/dL (ref 30.0–36.0)
MCV: 81.3 fl (ref 78.0–100.0)
MONOS PCT: 7.5 % (ref 3.0–12.0)
Monocytes Absolute: 0.6 10*3/uL (ref 0.1–1.0)
NEUTROS ABS: 5.2 10*3/uL (ref 1.4–7.7)
Neutrophils Relative %: 68.5 % (ref 43.0–77.0)
PLATELETS: 261 10*3/uL (ref 150.0–400.0)
RBC: 4.47 Mil/uL (ref 3.87–5.11)
RDW: 16.5 % — AB (ref 11.5–15.5)
WBC: 7.6 10*3/uL (ref 4.0–10.5)

## 2015-01-13 LAB — COMPREHENSIVE METABOLIC PANEL
ALT: 9 U/L (ref 0–35)
AST: 11 U/L (ref 0–37)
Albumin: 2.9 g/dL — ABNORMAL LOW (ref 3.5–5.2)
Alkaline Phosphatase: 124 U/L — ABNORMAL HIGH (ref 39–117)
BILIRUBIN TOTAL: 0.5 mg/dL (ref 0.2–1.2)
BUN: 30 mg/dL — ABNORMAL HIGH (ref 6–23)
CALCIUM: 7 mg/dL — AB (ref 8.4–10.5)
CHLORIDE: 91 meq/L — AB (ref 96–112)
CO2: 28 meq/L (ref 19–32)
Creatinine, Ser: 12.82 mg/dL (ref 0.40–1.20)
GFR: 4.09 mL/min — AB (ref >60.00)
Glucose, Bld: 641 mg/dL (ref 70–99)
Potassium: 3 mEq/L — ABNORMAL LOW (ref 3.5–5.1)
Sodium: 132 mEq/L — ABNORMAL LOW (ref 135–145)
Total Protein: 6.3 g/dL (ref 6.0–8.3)

## 2015-01-13 LAB — TSH: TSH: 2.61 u[IU]/mL (ref 0.35–4.50)

## 2015-01-13 LAB — IGA: IGA: 271 mg/dL (ref 68–378)

## 2015-01-13 MED ORDER — MOVIPREP 100 G PO SOLR: 1.0000 | Freq: Once | ORAL | Status: DC

## 2015-01-13 NOTE — Telephone Encounter (Signed)
Patient called and she is at work. She will go to [facility] ED.

## 2015-01-13 NOTE — Patient Instructions (Signed)
You have been scheduled for a colonoscopy. Please follow written instructions given to you at your visit today.  Please pick up your prep supplies at the pharmacy within the next 1-3 days. If you use inhalers (even only as needed), please bring them with you on the day of your procedure.  Your physician has requested that you go to the basement for lab work before leaving today

## 2015-01-13 NOTE — Progress Notes (Addendum)
Patient ID: Priscilla [name], female   DOB: 02-21-1971, 44 y.o.   MRN: [id number]    HPI:  Priscilla [name] is a 44 y.o.   female is referred by Pearson Grippe, MD, for evaluation of diarrhea. She has a rather complex medical history. In March she had a two-vessel coronary bypass surgery and mitral valve repair and PF O closure performed by Dr. Roxan Hockey. She also has type 1 diabetes mellitus and end-stage renal disease with a history of failed kidney and pancreas transplant. She is currently on peritoneal dialysis. Her coronary disease was discovered during evaluation for a another transplant. She had a prolonged need for temporary pacing after bypass surgery but did not require permanent pacemaker as her heart rate has recovered. 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 Before her surgery, LVEF was 35-40% with inferior wall hypokinesis, moderate left ventricular dye dilatation and severe mitral insufficiency. There was also moderate to severe tricuspid regurgitation. Tricuspid valve was not treated surgically. Echocardiogram on 10/25/2014 revealed an ejection fraction of 55-60%, septal hypokinesis, mitral valve with borderline mild stenosis, mild tricuspid regurgitation, and mild pulmonic regurgitation.  She states that for the past 4 months she has been troubled with diarrhea. Her diarrhea is watery to mushy in consistency, but more often watery. Her diarrhea is moderate in volume, and she does have nocturnal stooling. She has had no bright red blood per rectum or melena she notes that her bowel movements typically occur right after she eats and so she hasn't been eating as much. She has lost 7 or 8 pounds in the past 3 months. She has tried using Imodium with varying degrees of relief. She reports that her stools have been very oily and foul smelling. She denies a family history of celiac disease. There is no known family history of colon cancer, colon polyps, or  inflammatory bowel disease. She has some waves of nausea and not affected by food but she states her nausea is not new and has been present since she started having kidney problems. She denies vomiting, early satiety, or dysphagia. Prior to the onset of her diarrhea she had not traveled out of the country nor had she acquired any new pets. She has not had aunt biotics recently.   Past Medical History  Diagnosis Date  . H/O kidney transplant   . Pancreas transplanted   . Diabetes mellitus without complication   . Hypertension   . Renal disorder   . Anemia   . GERD (gastroesophageal reflux disease)   . End stage renal disease   . Hyperlipidemia   . Cardiomyopathy, ischemic 06/24/2014  . 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 S/P mitral valve repair 07/18/14    Past Surgical History  Procedure Laterality Date  . 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  Pancreas left mid abdomen  . Cesarean section    . Laparoscopic insertion peritoneal catheter  07/18/2008    Dr Excell Seltzer  . Laparoscopic repositioning capd catheter  12-03-202010    Dr Excell Seltzer  . Laparoscopic repositioning capd catheter  11/08/2008    Dr Excell Seltzer  . Omentectomy  11/08/2008    Dr Excell Seltzer  . Laparoscopic repositioning capd catheter  10/03/2009    Unplugging CAPD catheter Dr Excell Seltzer  . Insertion of dialysis catheter  September 28, 2012    Right upper chest  . Capd insertion N/A 09/08/2013    Procedure: Laparoscopic CAPD peritoneal dialysis catheter placement, possible lysis  of adhesions   ;  Surgeon: Adin Hector, MD;  Location: [place];  Service: General;  Laterality: N/A;  . Left heart catheterization with coronary angiogram N/A 06/28/2014    Procedure: LEFT HEART CATHETERIZATION WITH CORONARY ANGIOGRAM;  Surgeon: [name], MD;  Location: [place] CATH LAB;  Service: Cardiovascular;  Laterality: N/A;  . Cardiac catheterization      06/2014  . Coronary artery bypass graft N/A 07/18/2014    Procedure: CORONARY  ARTERY BYPASS GRAFTING (CABG)X2 LIMA-LAD; SVG-PD;  Surgeon: Melrose Nakayama, MD;  Location: [place];  Service: Open Heart Surgery;  Laterality: N/A;  . Mitral valve repair N/A 07/18/2014    Procedure: MITRAL VALVE REPAIR (MVR);  Surgeon: Melrose Nakayama, MD;  Location: [place];  Service: Open Heart Surgery;  Laterality: N/A;  . Tee without cardioversion N/A 07/18/2014    Procedure: TRANSESOPHAGEAL ECHOCARDIOGRAM (TEE);  Surgeon: Melrose Nakayama, MD;  Location: [place];  Service: Open Heart Surgery;  Laterality: N/A;   Family History  Problem Relation Age of Onset  . CAD Mother   . Stroke Mother   . COPD Father   . Colon cancer Neg Hx    Social History  Substance Use Topics  . Smoking status: Never Smoker   . Smokeless tobacco: Never Used  . Alcohol Use: No   Current Outpatient Prescriptions  Medication Sig Dispense Refill  . acetaminophen (TYLENOL) 325 MG tablet Take 650 mg by mouth every 4 (four) hours as needed for mild pain.    Marland Kitchen aspirin 325 MG tablet Take 325 mg by mouth daily.     . calcitRIOL (ROCALTROL) 0.25 MCG capsule Take 1 capsule (0.25 mcg total) by mouth daily. 30 capsule 1  . calcium acetate (PHOSLO) 667 MG capsule Take 2,001 mg by mouth 3 (three) times daily with meals.    . carvedilol (COREG) 6.25 MG tablet TAKE 1/2 TABLET TWICE A DAY X 3 WEEKS THEN INCREASE TO 1 TABLET TWICE A DAY 60 tablet 6  . gabapentin (NEURONTIN) 100 MG capsule Take 100 mg by mouth daily.    . hydrALAZINE (APRESOLINE) 10 MG tablet Take 10 mg by mouth 3 (three) times daily.    . insulin glargine (LANTUS) 100 UNIT/ML injection Inject 10 Units into the skin at bedtime.     . insulin lispro (HUMALOG) 100 UNIT/ML injection Inject 10-15 Units into the skin 3 (three) times daily before meals. Per sliding scale    . losartan (COZAAR) 100 MG tablet Take 100 mg by mouth daily.    . mirtazapine (REMERON) 15 MG tablet Take 15 mg by mouth at bedtime.    Marland Kitchen omeprazole (PRILOSEC) 20 MG capsule Take 1  capsule (20 mg total) by mouth daily as needed. 30 capsule   . oxyCODONE (OXY IR/ROXICODONE) 5 MG immediate release tablet Take 1-2 tablets (5-10 mg total) by mouth every 6 (six) hours as needed for severe pain. 40 tablet 0  . simvastatin (ZOCOR) 5 MG tablet Take 5 mg by mouth at bedtime.     Marland Kitchen MOVIPREP 100 G SOLR Take 1 kit (200 g total) by mouth once. 1 kit 0   No current facility-administered medications for this visit.   Allergies  Allergen Reactions  . Pork-Derived Products Other (See Comments)    Pt is [place]     Review of Systems: Per history of present illness otherwise negative.    LAB RESULTS: Blood work on 01/11/2015 showed a CBC with white blood count 5.14, hemoglobin 10.8, hip  hematocrit 33.3, platelets 255,000. Chem profile had BUN 35, creatinine 14.4, sodium 132, potassium 2.7, chloride 94, bicarbonate 23, calcium 6.4, AST 11, ALT 8, total bili 0.5    Physical Exam: BP 120/70 mmHg  Pulse 72  Ht 5' (1.524 m)  Wt 113 lb 12.8 oz (51.619 kg)  BMI 22.22 kg/m2 Constitutional: Pleasant,well-developed, African-American female in no acute distress. HEENT: Normocephalic and atraumatic. Conjunctivae are normal. No scleral icterus. Neck supple. No JVD Cardiovascular: Normal rate, regular rhythm. S3 Pulmonary/chest: Effort normal and breath sounds normal. No wheezing, rales or rhonchi. Abdominal: Soft, nondistended, nontender. Bowel sounds active throughout. There are no masses palpable. No hepatomegaly. Extremities: 2+ lower extremity edema bilaterally Lymphadenopathy: No cervical adenopathy noted. Neurological: Alert and oriented to person place and time. Skin: Skin is warm and dry. No rashes noted. Psychiatric: Normal mood and affect. Behavior is normal.  ASSESSMENT AND PLAN: 44 year old female with a history of mitral regurgitation hypertension, coronary artery disease, cardiomyopathy, and chronic kidney disease on peritoneal dialysis referred for evaluation of  diarrhea. TSH, IgA, TTG, stool for C. difficile, and pancreatic fecal elastase will be obtained. She will be scheduled for colonoscopy to evaluate for polyps, neoplasia, or possible microscopic colitis.The risks, benefits, and alternatives to colonoscopy with possible biopsy and possible polypectomy were discussed with the patient and they consent to proceed. Further recommendations will be made pending the findings of the above. The procedure will be scheduled with Dr. Hilarie Fredrickson.    Hvozdovic, Deloris Ping 01/13/2015, 11:22 AM  CC: Pearson Grippe, MD         [name], MD  Addendum: Reviewed and agree with initial management. Jerene Bears, MD

## 2015-01-13 NOTE — Telephone Encounter (Signed)
Called patient and left messages again at her cell and work to call me. Left a message for her sister also. [place] and requested an officer check on patient. They will send some one out.

## 2015-01-13 NOTE — Telephone Encounter (Signed)
Left a voice mail for patient to call me to confirm receipt that she needs to go to ED for Glucose over 600. Left a message at work number and for her sister to call ASAP also.

## 2015-01-16 ENCOUNTER — Ambulatory Visit (INDEPENDENT_AMBULATORY_CARE_PROVIDER_SITE_OTHER): Payer: PRIVATE HEALTH INSURANCE | Admitting: Cardiovascular Disease

## 2015-01-16 ENCOUNTER — Encounter: Payer: Self-pay | Admitting: Cardiovascular Disease

## 2015-01-16 VITALS — BP 88/44 | HR 75 | Ht 60.0 in | Wt 109.8 lb

## 2015-01-16 DIAGNOSIS — N186 End stage renal disease: Secondary | ICD-10-CM

## 2015-01-16 DIAGNOSIS — I340 Nonrheumatic mitral (valve) insufficiency: Secondary | ICD-10-CM | POA: Diagnosis not present

## 2015-01-16 DIAGNOSIS — E785 Hyperlipidemia, unspecified: Secondary | ICD-10-CM

## 2015-01-16 DIAGNOSIS — I2510 Atherosclerotic heart disease of native coronary artery without angina pectoris: Secondary | ICD-10-CM

## 2015-01-16 DIAGNOSIS — I10 Essential (primary) hypertension: Secondary | ICD-10-CM

## 2015-01-16 DIAGNOSIS — I255 Ischemic cardiomyopathy: Secondary | ICD-10-CM

## 2015-01-16 DIAGNOSIS — Z79899 Other long term (current) drug therapy: Secondary | ICD-10-CM

## 2015-01-16 DIAGNOSIS — Z951 Presence of aortocoronary bypass graft: Secondary | ICD-10-CM

## 2015-01-16 LAB — TISSUE TRANSGLUTAMINASE, IGA: TISSUE TRANSGLUTAMINASE AB, IGA: 1 U/mL (ref <4)

## 2015-01-16 NOTE — Progress Notes (Signed)
Patient ID: Priscilla [name], female   DOB: 1970/09/10, 44 y.o.   MRN: [id number]     Cardiology Office Note   Date:  01/16/2015   ID:  Priscilla [name], DOB 05-01-1970, MRN [id number]  PCP:  [name], MD  Cardiologist:   [name], MD   Chief Complaint  Patient presents with  . Follow-up    No complaints of chest pain, SOB or dizziness.  Swelling due to kidney issues.      History of Present Illness: Priscilla [name] is a 44 y.o. female who presents for follow-up 6 months after 2 vessel coronary bypass surgery (LIMA-LAD and SVG-PDA) and mitral valve repair (#28 Memo ring) and PFO closure performed by Dr. Roxan Hockey on March 21. She also has Type 1 diabetes mellitus and end-stage renal disease with a history of failed kidney and pancreas transplant, currently on peritoneal dialysis. Coronary disease was discovered during evaluation for another transplant. Preop left ventricular ejection fraction was only 35-40% and there was moderate-severe tricuspid regurgitation. Repeat echocardiography in June shows complete normalization of left ventricular ejection fraction at 55-60% and excellent mitral valve repair, now mild mitral regurgitation and mild tricuspid regurgitation.  She has no cardiovascular complaints and is working a full-time job as well as raising her children. She denies exertional angina or dyspnea. Unfortunate she is having problems with persistent diarrhea and this has led to substantial weight loss and some degree of hypoalbuminemia. She has also had problems with persistent and at times severe hypokalemia and is now taking potassium supplements despite being on dialysis. She complains of fatigue and occasional dizziness    Past Medical History  Diagnosis Date  . H/O kidney transplant   . Pancreas transplanted   . Diabetes mellitus without complication   . Hypertension   . Renal disorder   . Anemia   . GERD (gastroesophageal reflux  disease)   . End stage renal disease   . Hyperlipidemia   . Cardiomyopathy, ischemic 06/24/2014  . 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 S/P mitral valve repair 07/18/14    Past Surgical History  Procedure Laterality Date  . 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  Pancreas left mid abdomen  . Cesarean section    . Laparoscopic insertion peritoneal catheter  07/18/2008    Dr Excell Seltzer  . Laparoscopic repositioning capd catheter  2020/06/1008    Dr Excell Seltzer  . Laparoscopic repositioning capd catheter  11/08/2008    Dr Excell Seltzer  . Omentectomy  11/08/2008    Dr Excell Seltzer  . Laparoscopic repositioning capd catheter  10/03/2009    Unplugging CAPD catheter Dr Excell Seltzer  . Insertion of dialysis catheter  September 28, 2012    Right upper chest  . Capd insertion N/A 09/08/2013    Procedure: Laparoscopic CAPD peritoneal dialysis catheter placement, possible lysis of adhesions   ;  Surgeon: Adin Hector, MD;  Location: [place];  Service: General;  Laterality: N/A;  . Left heart catheterization with coronary angiogram N/A 06/28/2014    Procedure: LEFT HEART CATHETERIZATION WITH CORONARY ANGIOGRAM;  Surgeon: [name], MD;  Location: [place] CATH LAB;  Service: Cardiovascular;  Laterality: N/A;  . Cardiac catheterization      06/2014  . Coronary artery bypass graft N/A 07/18/2014    Procedure: CORONARY ARTERY BYPASS GRAFTING (CABG)X2 LIMA-LAD; SVG-PD;  Surgeon: Melrose Nakayama, MD;  Location: [place];  Service: Open Heart Surgery;  Laterality: N/A;  . Mitral valve repair N/A 07/18/2014    Procedure: MITRAL VALVE  REPAIR (MVR);  Surgeon: Melrose Nakayama, MD;  Location: [place];  Service: Open Heart Surgery;  Laterality: N/A;  . Tee without cardioversion N/A 07/18/2014    Procedure: TRANSESOPHAGEAL ECHOCARDIOGRAM (TEE);  Surgeon: Melrose Nakayama, MD;  Location: [place];  Service: Open Heart Surgery;  Laterality: N/A;     Current Outpatient Prescriptions  Medication Sig Dispense Refill  .  acetaminophen (TYLENOL) 325 MG tablet Take 650 mg by mouth every 4 (four) hours as needed for mild pain.    Marland Kitchen aspirin 325 MG tablet Take 325 mg by mouth daily.     . calcitRIOL (ROCALTROL) 0.25 MCG capsule Take 1 capsule (0.25 mcg total) by mouth daily. 30 capsule 1  . calcium acetate (PHOSLO) 667 MG capsule Take 2,001 mg by mouth 3 (three) times daily with meals.    . carvedilol (COREG) 6.25 MG tablet TAKE 1/2 TABLET TWICE A DAY X 3 WEEKS THEN INCREASE TO 1 TABLET TWICE A DAY 60 tablet 6  . gabapentin (NEURONTIN) 100 MG capsule Take 100 mg by mouth daily.    . hydrALAZINE (APRESOLINE) 10 MG tablet Take 10 mg by mouth 3 (three) times daily.    . insulin glargine (LANTUS) 100 UNIT/ML injection Inject 10 Units into the skin at bedtime.     . insulin lispro (HUMALOG) 100 UNIT/ML injection Inject 10-15 Units into the skin 3 (three) times daily before meals. Per sliding scale    . losartan (COZAAR) 100 MG tablet Take 100 mg by mouth daily.    . mirtazapine (REMERON) 15 MG tablet Take 15 mg by mouth at bedtime.    Marland Kitchen MOVIPREP 100 G SOLR Take 1 kit (200 g total) by mouth once. 1 kit 0  . omeprazole (PRILOSEC) 20 MG capsule Take 1 capsule (20 mg total) by mouth daily as needed. 30 capsule   . oxyCODONE (OXY IR/ROXICODONE) 5 MG immediate release tablet Take 1-2 tablets (5-10 mg total) by mouth every 6 (six) hours as needed for severe pain. 40 tablet 0  . potassium chloride (KLOR-CON) 20 MEQ packet Take 40 mEq by mouth 2 (two) times daily.    . simvastatin (ZOCOR) 5 MG tablet Take 5 mg by mouth at bedtime.      No current facility-administered medications for this visit.    Allergies:   Pork-derived products    Social History:  The patient  reports that she has never smoked. She has never used smokeless tobacco. She reports that she does not drink alcohol or use illicit drugs.   Family History:  The patient's family history includes CAD in her mother; COPD in her father; Stroke in her mother. There is  no history of Colon cancer.    ROS:  Please see the history of present illness.    Otherwise, review of systems positive for none.   All other systems are reviewed and negative.    PHYSICAL EXAM: VS:  BP 88/44 mmHg  Pulse 75  Ht 5' (1.524 m)  Wt 109 lb 12.8 oz (49.805 kg)  BMI 21.44 kg/m2 , BMI Body mass index is 21.44 kg/(m^2).  General: Alert, oriented x3, no distress, slightly pale Head: no evidence of trauma, PERRL, EOMI, no exophtalmos or lid lag, no myxedema, no xanthelasma; normal ears, nose and oropharynx Neck: normal jugular venous pulsations and no hepatojugular reflux; brisk carotid pulses without delay and no carotid bruits Chest: clear to auscultation, no signs of consolidation by percussion or palpation, normal fremitus, symmetrical and full respiratory excursions Cardiovascular:  normal position and quality of the apical impulse, regular rhythm, normal first and widely split second heart sounds, no murmurs, rubs or gallops Abdomen: no tenderness or distention, no masses by palpation, no abnormal pulsatility or arterial bruits, normal bowel sounds, no hepatosplenomegaly. Tenckhoff dialysis catheter. Extremities: no clubbing, cyanosis; 1-2 plus left foot edema, trivial right foot edema; 2+ radial, ulnar and brachial pulses bilaterally; 2+ right femoral, posterior tibial and dorsalis pedis pulses; 2+ left femoral, posterior tibial and dorsalis pedis pulses; no subclavian or femoral bruits Neurological: grossly nonfocal Psych: euthymic mood, full affect   EKG:  EKG is not ordered today.   Recent Labs: 07/19/2014: Magnesium 1.7 01/13/2015: ALT 9; BUN 30*; Creatinine, Ser 12.82*; Hemoglobin 11.7*; Platelets 261.0; Potassium 3.0*; Sodium 132*; TSH 2.61    Wt Readings from Last 3 Encounters:  01/16/15 109 lb 12.8 oz (49.805 kg)  01/13/15 113 lb 12.8 oz (51.619 kg)  11/28/14 117 lb 3.2 oz (53.162 kg)      ASSESSMENT AND PLAN:  1. Hypotension, will stop her  hydralazine  2. Coronary artery disease status post two-vessel bypass surgery with return to normal of left ventricular systolic function, asymptomatic.  If necessary may have to decrease the dose of losartan and/or carvedilol if she continues to have symptomatic hypotension. Plan to recheck a lipid profile.  3. Mitral sufficiency status post successful mitral valve repair  4. End-stage renal disease on peritoneal dialysis  5. Persistent diarrhea of uncertain cause, undergoing workup. She is to have a colonoscopy in the near future. Clearly, this is contributing to her weight loss. She is becoming malnourished. This is likely the cause of reduced doses of blood pressure medications.  6. Insulin requiring diabetes mellitus, type I. Glycemic control has been erratic recently, also possibly a reflection of the problems related to her diarrhea    Current medicines are reviewed at length with the patient today.  The patient does not have concerns regarding medicines.  The following changes have been made:  Stop hydralazine  Labs/ tests ordered today include:  Orders Placed This Encounter  Procedures  . Basic metabolic panel  . Lipid panel    Patient Instructions  Your physician recommends that you return for lab work in: FASTING AT [facility]  Dr. Sallyanne Kuster recommends that you schedule a follow-up appointment in: [place], CROITORU,MIHAI, MD  01/16/2015 7:18 PM    [name], MD, [facility] [phone] office [phone] pager

## 2015-01-16 NOTE — Patient Instructions (Signed)
Your physician recommends that you return for lab work in: FASTING AT [facility]  Dr. Sallyanne Kuster recommends that you schedule a follow-up appointment in: 6 MONTHS

## 2015-01-17 ENCOUNTER — Telehealth: Payer: Self-pay

## 2015-01-17 ENCOUNTER — Other Ambulatory Visit: Payer: Self-pay

## 2015-01-17 DIAGNOSIS — R197 Diarrhea, unspecified: Secondary | ICD-10-CM

## 2015-01-17 LAB — BASIC METABOLIC PANEL
BUN: 25 mg/dL (ref 7–25)
CALCIUM: 7.1 mg/dL — AB (ref 8.6–10.2)
CO2: 26 mmol/L (ref 20–31)
CREATININE: 10.48 mg/dL — AB (ref 0.50–1.10)
Chloride: 92 mmol/L — ABNORMAL LOW (ref 98–110)
GLUCOSE: 565 mg/dL — AB (ref 65–99)
Potassium: 3.3 mmol/L — ABNORMAL LOW (ref 3.5–5.3)
SODIUM: 132 mmol/L — AB (ref 135–146)

## 2015-01-17 LAB — LIPID PANEL
CHOL/HDL RATIO: 3.4 ratio (ref <=5.0)
CHOLESTEROL: 196 mg/dL (ref 125–200)
HDL: 57 mg/dL (ref >=46)
LDL Cholesterol: 124 mg/dL (ref <130)
Triglycerides: 76 mg/dL (ref <150)
VLDL: 15 mg/dL (ref <30)

## 2015-01-17 NOTE — Telephone Encounter (Signed)
Orders entered and pt aware. 

## 2015-01-18 ENCOUNTER — Telehealth: Payer: Self-pay | Admitting: Cardiovascular Disease

## 2015-01-18 NOTE — Telephone Encounter (Signed)
Received call from [facility] with [facility] lab calling to report critical glucose 565,creat 10.48.Message sent to Dr.Croitoru.

## 2015-01-18 NOTE — Telephone Encounter (Signed)
Priscilla [name] is wanting to report some critical labs for the pt.   Thanks

## 2015-01-18 NOTE — Telephone Encounter (Signed)
Returning your call. °

## 2015-01-19 ENCOUNTER — Telehealth: Payer: Self-pay | Admitting: *Deleted

## 2015-01-19 MED ORDER — SIMVASTATIN 20 MG PO TABS: 20.0000 mg | ORAL_TABLET | Freq: Every day | ORAL | Status: DC

## 2015-01-19 NOTE — Telephone Encounter (Signed)
-----   Message from [name], MD sent at 01/18/2015  8:48 AM EDT ----- Potassium still low, but a little better; please let her know and send report ASAP to Dr. Joelyn Oms as well. Glucose very high (even for nonfasting state) LDL is high, please increase simvastatin to 20 mg at bedtime daily

## 2015-01-19 NOTE — Telephone Encounter (Signed)
Patient notified of lab results and to increase simvastatin to 20mg  hs.  Patient voiced understanding.  New Rx sent to pharmacy.  Lab results sent to all her doctors.

## 2015-01-23 ENCOUNTER — Other Ambulatory Visit: Payer: PRIVATE HEALTH INSURANCE

## 2015-01-23 DIAGNOSIS — R197 Diarrhea, unspecified: Secondary | ICD-10-CM

## 2015-01-24 LAB — OVA AND PARASITE EXAMINATION: OP: NONE SEEN

## 2015-01-24 NOTE — Telephone Encounter (Signed)
Can this encounter be closed?

## 2015-02-01 ENCOUNTER — Other Ambulatory Visit: Payer: PRIVATE HEALTH INSURANCE

## 2015-02-01 ENCOUNTER — Telehealth: Payer: Self-pay | Admitting: Internal Medicine

## 2015-02-01 NOTE — Telephone Encounter (Signed)
Let pt know that the results are not back yet.

## 2015-02-02 LAB — PANCREATIC ELASTASE, FECAL: PANCREATIC ELASTASE-1, STL: 241 ug/g

## 2015-02-17 ENCOUNTER — Telehealth: Payer: Self-pay | Admitting: Internal Medicine

## 2015-02-17 NOTE — Telephone Encounter (Signed)
Left message for pt to call back  °

## 2015-02-22 NOTE — Telephone Encounter (Signed)
Left message for pt to call back. Spoke with lab and [facility] and was told specimen not received, test not done. If pt still having diarrhea will need to repeat test.

## 2015-02-23 NOTE — Telephone Encounter (Signed)
Left message for pt stating that the cdiff lab was not done and that if she is still having diarrhea then she needs to come and give another specimen.

## 2015-03-03 ENCOUNTER — Encounter: Payer: Self-pay | Admitting: Internal Medicine

## 2015-03-10 ENCOUNTER — Encounter: Payer: PRIVATE HEALTH INSURANCE | Admitting: Internal Medicine

## 2015-03-10 ENCOUNTER — Telehealth: Payer: Self-pay | Admitting: Internal Medicine

## 2015-03-10 NOTE — Telephone Encounter (Signed)
no

## 2015-05-16 ENCOUNTER — Encounter: Payer: PRIVATE HEALTH INSURANCE | Admitting: Internal Medicine

## 2015-09-14 ENCOUNTER — Encounter (INDEPENDENT_AMBULATORY_CARE_PROVIDER_SITE_OTHER): Payer: Self-pay

## 2016-03-14 ENCOUNTER — Other Ambulatory Visit: Payer: Self-pay

## 2016-03-14 MED ORDER — SIMVASTATIN 20 MG PO TABS: 20.0000 mg | ORAL_TABLET | Freq: Every day | ORAL | 0 refills | Status: DC

## 2016-05-10 ENCOUNTER — Encounter (INDEPENDENT_AMBULATORY_CARE_PROVIDER_SITE_OTHER): Payer: Self-pay | Admitting: Family

## 2016-05-10 ENCOUNTER — Encounter (INDEPENDENT_AMBULATORY_CARE_PROVIDER_SITE_OTHER): Payer: Self-pay

## 2016-05-14 ENCOUNTER — Inpatient Hospital Stay (HOSPITAL_COMMUNITY)
Admission: EM | Admit: 2016-05-14 | Discharge: 2016-05-21 | DRG: 637 | Disposition: A | Discharge status: Home / Self Care | Payer: Medicare Other | Attending: Family Medicine | Admitting: Family Medicine

## 2016-05-14 ENCOUNTER — Emergency Department (HOSPITAL_COMMUNITY): Payer: Medicare Other

## 2016-05-14 ENCOUNTER — Encounter (HOSPITAL_COMMUNITY): Payer: Self-pay

## 2016-05-14 DIAGNOSIS — Y830 Surgical operation with transplant of whole organ as the cause of abnormal reaction of the patient, or of later complication, without mention of misadventure at the time of the procedure: Secondary | ICD-10-CM | POA: Diagnosis present

## 2016-05-14 DIAGNOSIS — I509 Heart failure, unspecified: Secondary | ICD-10-CM | POA: Insufficient documentation

## 2016-05-14 DIAGNOSIS — J069 Acute upper respiratory infection, unspecified: Secondary | ICD-10-CM | POA: Diagnosis present

## 2016-05-14 DIAGNOSIS — Z951 Presence of aortocoronary bypass graft: Secondary | ICD-10-CM

## 2016-05-14 DIAGNOSIS — B974 Respiratory syncytial virus as the cause of diseases classified elsewhere: Secondary | ICD-10-CM | POA: Diagnosis present

## 2016-05-14 DIAGNOSIS — F329 Major depressive disorder, single episode, unspecified: Secondary | ICD-10-CM | POA: Diagnosis present

## 2016-05-14 DIAGNOSIS — E1110 Type 2 diabetes mellitus with ketoacidosis without coma: Secondary | ICD-10-CM

## 2016-05-14 DIAGNOSIS — G629 Polyneuropathy, unspecified: Secondary | ICD-10-CM | POA: Diagnosis present

## 2016-05-14 DIAGNOSIS — R7401 Elevation of levels of liver transaminase levels: Secondary | ICD-10-CM

## 2016-05-14 DIAGNOSIS — G92 Toxic encephalopathy: Secondary | ICD-10-CM | POA: Diagnosis present

## 2016-05-14 DIAGNOSIS — I255 Ischemic cardiomyopathy: Secondary | ICD-10-CM | POA: Diagnosis present

## 2016-05-14 DIAGNOSIS — Z7982 Long term (current) use of aspirin: Secondary | ICD-10-CM

## 2016-05-14 DIAGNOSIS — I2510 Atherosclerotic heart disease of native coronary artery without angina pectoris: Secondary | ICD-10-CM | POA: Diagnosis present

## 2016-05-14 DIAGNOSIS — T402X5A Adverse effect of other opioids, initial encounter: Secondary | ICD-10-CM | POA: Diagnosis present

## 2016-05-14 DIAGNOSIS — R740 Nonspecific elevation of levels of transaminase and lactic acid dehydrogenase [LDH]: Secondary | ICD-10-CM

## 2016-05-14 DIAGNOSIS — J189 Pneumonia, unspecified organism: Secondary | ICD-10-CM

## 2016-05-14 DIAGNOSIS — D573 Sickle-cell trait: Secondary | ICD-10-CM | POA: Diagnosis present

## 2016-05-14 DIAGNOSIS — D649 Anemia, unspecified: Secondary | ICD-10-CM

## 2016-05-14 DIAGNOSIS — I5032 Chronic diastolic (congestive) heart failure: Secondary | ICD-10-CM | POA: Diagnosis not present

## 2016-05-14 DIAGNOSIS — Z992 Dependence on renal dialysis: Secondary | ICD-10-CM | POA: Diagnosis not present

## 2016-05-14 DIAGNOSIS — K219 Gastro-esophageal reflux disease without esophagitis: Secondary | ICD-10-CM | POA: Diagnosis present

## 2016-05-14 DIAGNOSIS — T426X5A Adverse effect of other antiepileptic and sedative-hypnotic drugs, initial encounter: Secondary | ICD-10-CM | POA: Diagnosis present

## 2016-05-14 DIAGNOSIS — T8611 Kidney transplant rejection: Secondary | ICD-10-CM | POA: Diagnosis not present

## 2016-05-14 DIAGNOSIS — T43025A Adverse effect of tetracyclic antidepressants, initial encounter: Secondary | ICD-10-CM | POA: Diagnosis present

## 2016-05-14 DIAGNOSIS — E0810 Diabetes mellitus due to underlying condition with ketoacidosis without coma: Secondary | ICD-10-CM | POA: Diagnosis not present

## 2016-05-14 DIAGNOSIS — D631 Anemia in chronic kidney disease: Secondary | ICD-10-CM | POA: Diagnosis present

## 2016-05-14 DIAGNOSIS — T86891 Other transplanted tissue failure: Secondary | ICD-10-CM | POA: Diagnosis present

## 2016-05-14 DIAGNOSIS — Y92009 Unspecified place in unspecified non-institutional (private) residence as the place of occurrence of the external cause: Secondary | ICD-10-CM | POA: Diagnosis not present

## 2016-05-14 DIAGNOSIS — E109 Type 1 diabetes mellitus without complications: Secondary | ICD-10-CM | POA: Diagnosis present

## 2016-05-14 DIAGNOSIS — I5033 Acute on chronic diastolic (congestive) heart failure: Secondary | ICD-10-CM | POA: Diagnosis present

## 2016-05-14 DIAGNOSIS — N186 End stage renal disease: Secondary | ICD-10-CM | POA: Diagnosis present

## 2016-05-14 DIAGNOSIS — T8612 Kidney transplant failure: Secondary | ICD-10-CM | POA: Diagnosis present

## 2016-05-14 DIAGNOSIS — E785 Hyperlipidemia, unspecified: Secondary | ICD-10-CM | POA: Diagnosis present

## 2016-05-14 DIAGNOSIS — I132 Hypertensive heart and chronic kidney disease with heart failure and with stage 5 chronic kidney disease, or end stage renal disease: Secondary | ICD-10-CM | POA: Diagnosis present

## 2016-05-14 DIAGNOSIS — D509 Iron deficiency anemia, unspecified: Secondary | ICD-10-CM | POA: Diagnosis present

## 2016-05-14 DIAGNOSIS — R748 Abnormal levels of other serum enzymes: Secondary | ICD-10-CM | POA: Diagnosis not present

## 2016-05-14 DIAGNOSIS — Z781 Physical restraint status: Secondary | ICD-10-CM

## 2016-05-14 DIAGNOSIS — Z794 Long term (current) use of insulin: Secondary | ICD-10-CM

## 2016-05-14 DIAGNOSIS — E1122 Type 2 diabetes mellitus with diabetic chronic kidney disease: Secondary | ICD-10-CM | POA: Diagnosis present

## 2016-05-14 DIAGNOSIS — Z8249 Family history of ischemic heart disease and other diseases of the circulatory system: Secondary | ICD-10-CM

## 2016-05-14 DIAGNOSIS — Z9115 Patient's noncompliance with renal dialysis: Secondary | ICD-10-CM

## 2016-05-14 DIAGNOSIS — E1010 Type 1 diabetes mellitus with ketoacidosis without coma: Secondary | ICD-10-CM | POA: Diagnosis not present

## 2016-05-14 DIAGNOSIS — E871 Hypo-osmolality and hyponatremia: Secondary | ICD-10-CM | POA: Diagnosis present

## 2016-05-14 DIAGNOSIS — I452 Bifascicular block: Secondary | ICD-10-CM | POA: Diagnosis present

## 2016-05-14 DIAGNOSIS — G8929 Other chronic pain: Secondary | ICD-10-CM | POA: Diagnosis present

## 2016-05-14 DIAGNOSIS — R0902 Hypoxemia: Secondary | ICD-10-CM | POA: Diagnosis not present

## 2016-05-14 DIAGNOSIS — Z789 Other specified health status: Secondary | ICD-10-CM | POA: Diagnosis not present

## 2016-05-14 HISTORY — DX: Type 2 diabetes mellitus with ketoacidosis without coma: E11.10

## 2016-05-14 LAB — CBC WITH DIFFERENTIAL/PLATELET
BASOS ABS: 0 10*3/uL (ref 0.0–0.1)
Basophils Relative: 0 %
Eosinophils Absolute: 0 10*3/uL (ref 0.0–0.7)
Eosinophils Relative: 0 %
HEMATOCRIT: 31.8 % — AB (ref 36.0–46.0)
HEMOGLOBIN: 10.6 g/dL — AB (ref 12.0–15.0)
LYMPHS ABS: 1 10*3/uL (ref 0.7–4.0)
LYMPHS PCT: 10 %
MCH: 25.9 pg — AB (ref 26.0–34.0)
MCHC: 33.3 g/dL (ref 30.0–36.0)
MCV: 77.8 fL — AB (ref 78.0–100.0)
Monocytes Absolute: 0.5 10*3/uL (ref 0.1–1.0)
Monocytes Relative: 5 %
NEUTROS ABS: 8.2 10*3/uL — AB (ref 1.7–7.7)
NEUTROS PCT: 84 %
Platelets: 170 10*3/uL (ref 150–400)
RBC: 4.09 MIL/uL (ref 3.87–5.11)
RDW: 15.9 % — ABNORMAL HIGH (ref 11.5–15.5)
WBC: 9.7 10*3/uL (ref 4.0–10.5)

## 2016-05-14 LAB — I-STAT VENOUS BLOOD GAS, ED
Acid-base deficit: 7 mmol/L — ABNORMAL HIGH (ref 0.0–2.0)
Acid-base deficit: 7 mmol/L — ABNORMAL HIGH (ref 0.0–2.0)
BICARBONATE: 20.1 mmol/L (ref 20.0–28.0)
Bicarbonate: 20.1 mmol/L (ref 20.0–28.0)
O2 SAT: 36 %
O2 Saturation: 36 %
PCO2 VEN: 43.4 mmHg — AB (ref 44.0–60.0)
PCO2 VEN: 43.4 mmHg — AB (ref 44.0–60.0)
PH VEN: 7.275 (ref 7.250–7.430)
TCO2: 21 mmol/L (ref 0–100)
TCO2: 21 mmol/L (ref 0–100)
pH, Ven: 7.275 (ref 7.250–7.430)
pO2, Ven: 24 mmHg — CL (ref 32.0–45.0)
pO2, Ven: 24 mmHg — CL (ref 32.0–45.0)

## 2016-05-14 LAB — COMPREHENSIVE METABOLIC PANEL
ALBUMIN: 2.9 g/dL — AB (ref 3.5–5.0)
ALK PHOS: 97 U/L (ref 38–126)
ALT: 109 U/L — AB (ref 14–54)
ANION GAP: 22 — AB (ref 5–15)
AST: 110 U/L — ABNORMAL HIGH (ref 15–41)
BUN: 61 mg/dL — ABNORMAL HIGH (ref 6–20)
CHLORIDE: 81 mmol/L — AB (ref 101–111)
CO2: 15 mmol/L — AB (ref 22–32)
Calcium: 8.2 mg/dL — ABNORMAL LOW (ref 8.9–10.3)
Creatinine, Ser: 14.72 mg/dL — ABNORMAL HIGH (ref 0.44–1.00)
GFR calc non Af Amer: 3 mL/min — ABNORMAL LOW (ref >60)
GFR, EST AFRICAN AMERICAN: 3 mL/min — AB (ref >60)
Glucose, Bld: 878 mg/dL (ref 65–99)
Potassium: 5.1 mmol/L (ref 3.5–5.1)
SODIUM: 118 mmol/L — AB (ref 135–145)
Total Bilirubin: 0.8 mg/dL (ref 0.3–1.2)
Total Protein: 6.7 g/dL (ref 6.5–8.1)

## 2016-05-14 LAB — I-STAT CHEM 8, ED
BUN: 58 mg/dL — AB (ref 6–20)
BUN: 55 mg/dL — AB (ref 6–20)
CALCIUM ION: 0.97 mmol/L — AB (ref 1.15–1.40)
CHLORIDE: 85 mmol/L — AB (ref 101–111)
CREATININE: 15.7 mg/dL — AB (ref 0.44–1.00)
Calcium, Ion: 0.93 mmol/L — ABNORMAL LOW (ref 1.15–1.40)
Chloride: 84 mmol/L — ABNORMAL LOW (ref 101–111)
Creatinine, Ser: 15.2 mg/dL — ABNORMAL HIGH (ref 0.44–1.00)
GLUCOSE: 668 mg/dL — AB (ref 65–99)
Glucose, Bld: >700 mg/dL (ref 65–99)
HEMATOCRIT: 36 % (ref 36.0–46.0)
HEMATOCRIT: 33 % — AB (ref 36.0–46.0)
HEMOGLOBIN: 12.2 g/dL (ref 12.0–15.0)
Hemoglobin: 11.2 g/dL — ABNORMAL LOW (ref 12.0–15.0)
POTASSIUM: 5.2 mmol/L — AB (ref 3.5–5.1)
Potassium: 4.8 mmol/L (ref 3.5–5.1)
Sodium: 118 mmol/L — CL (ref 135–145)
Sodium: 121 mmol/L — ABNORMAL LOW (ref 135–145)
TCO2: 22 mmol/L (ref 0–100)
TCO2: 20 mmol/L (ref 0–100)

## 2016-05-14 LAB — BASIC METABOLIC PANEL
ANION GAP: 18 — AB (ref 5–15)
ANION GAP: 22 — AB (ref 5–15)
BUN: 62 mg/dL — AB (ref 6–20)
BUN: 64 mg/dL — AB (ref 6–20)
CALCIUM: 7.9 mg/dL — AB (ref 8.9–10.3)
CO2: 17 mmol/L — AB (ref 22–32)
CO2: 17 mmol/L — AB (ref 22–32)
Calcium: 8 mg/dL — ABNORMAL LOW (ref 8.9–10.3)
Chloride: 87 mmol/L — ABNORMAL LOW (ref 101–111)
Chloride: 87 mmol/L — ABNORMAL LOW (ref 101–111)
Creatinine, Ser: 14.53 mg/dL — ABNORMAL HIGH (ref 0.44–1.00)
Creatinine, Ser: 14.77 mg/dL — ABNORMAL HIGH (ref 0.44–1.00)
GFR calc Af Amer: 3 mL/min — ABNORMAL LOW (ref >60)
GFR calc Af Amer: 3 mL/min — ABNORMAL LOW (ref >60)
GFR calc non Af Amer: 3 mL/min — ABNORMAL LOW (ref >60)
GFR calc non Af Amer: 3 mL/min — ABNORMAL LOW (ref >60)
GLUCOSE: 567 mg/dL — AB (ref 65–99)
GLUCOSE: 279 mg/dL — AB (ref 65–99)
POTASSIUM: 4.6 mmol/L (ref 3.5–5.1)
POTASSIUM: 3.8 mmol/L (ref 3.5–5.1)
Sodium: 122 mmol/L — ABNORMAL LOW (ref 135–145)
Sodium: 126 mmol/L — ABNORMAL LOW (ref 135–145)

## 2016-05-14 LAB — TROPONIN I
Troponin I: 0.53 ng/mL (ref <0.03)
Troponin I: 0.69 ng/mL (ref <0.03)

## 2016-05-14 LAB — CBG MONITORING, ED: Glucose-Capillary: >600 mg/dL (ref 65–99)

## 2016-05-14 LAB — I-STAT CG4 LACTIC ACID, ED
LACTIC ACID, VENOUS: 4.05 mmol/L — AB (ref 0.5–1.9)
Lactic Acid, Venous: 4.56 mmol/L (ref 0.5–1.9)
Lactic Acid, Venous: 4.05 mmol/L (ref 0.5–1.9)
Lactic Acid, Venous: 3.51 mmol/L (ref 0.5–1.9)

## 2016-05-14 LAB — GLUCOSE, CAPILLARY
GLUCOSE-CAPILLARY: 417 mg/dL — AB (ref 65–99)
Glucose-Capillary: 134 mg/dL — ABNORMAL HIGH (ref 65–99)
Glucose-Capillary: 227 mg/dL — ABNORMAL HIGH (ref 65–99)
Glucose-Capillary: 271 mg/dL — ABNORMAL HIGH (ref 65–99)
Glucose-Capillary: 525 mg/dL (ref 65–99)

## 2016-05-14 LAB — PROTIME-INR
INR: 1.42
Prothrombin Time: 17.4 seconds — ABNORMAL HIGH (ref 11.4–15.2)

## 2016-05-14 MED ORDER — GABAPENTIN 100 MG PO CAPS: 100.0000 mg | ORAL_CAPSULE | Freq: Every day | ORAL | Status: DC

## 2016-05-14 MED ORDER — BENZONATATE 100 MG PO CAPS: 100.0000 mg | ORAL_CAPSULE | Freq: Three times a day (TID) | ORAL | Status: DC | PRN

## 2016-05-14 MED ORDER — DEXTROSE-NACL 5-0.45 % IV SOLN
INTRAVENOUS | Status: DC
  Administered 2016-05-14: 23:00:00 via INTRAVENOUS

## 2016-05-14 MED ORDER — DEXTROSE-NACL 5-0.45 % IV SOLN: INTRAVENOUS | Status: DC

## 2016-05-14 MED ORDER — BENZONATATE 100 MG PO CAPS: 200.0000 mg | ORAL_CAPSULE | Freq: Once | ORAL | Status: DC

## 2016-05-14 MED ORDER — SODIUM CHLORIDE 0.9 % IV SOLN
INTRAVENOUS | Status: DC
  Administered 2016-05-16: 0.5 [IU]/h via INTRAVENOUS
  Filled 2016-05-14 (×2): qty 2.5

## 2016-05-14 MED ORDER — CARVEDILOL 6.25 MG PO TABS
6.2500 mg | ORAL_TABLET | Freq: Two times a day (BID) | ORAL | Status: DC
  Administered 2016-05-16 – 2016-05-20 (×8): 6.25 mg via ORAL
  Filled 2016-05-14 (×9): qty 1

## 2016-05-14 MED ORDER — PROMETHAZINE HCL 25 MG PO TABS: 12.5000 mg | ORAL_TABLET | Freq: Four times a day (QID) | ORAL | Status: DC | PRN

## 2016-05-14 MED ORDER — HYDRALAZINE HCL 10 MG PO TABS
10.0000 mg | ORAL_TABLET | Freq: Three times a day (TID) | ORAL | Status: DC
  Administered 2016-05-14 – 2016-05-20 (×8): 10 mg via ORAL
  Filled 2016-05-14 (×12): qty 1

## 2016-05-14 MED ORDER — HEPARIN SODIUM (PORCINE) 5000 UNIT/ML IJ SOLN: 5000.0000 [IU] | Freq: Three times a day (TID) | INTRAMUSCULAR | Status: DC

## 2016-05-14 MED ORDER — CALCIUM ACETATE (PHOS BINDER) 667 MG PO CAPS
2001.0000 mg | ORAL_CAPSULE | Freq: Three times a day (TID) | ORAL | Status: DC
  Filled 2016-05-14 (×4): qty 3

## 2016-05-14 MED ORDER — SODIUM CHLORIDE 0.9 % IV SOLN
INTRAVENOUS | Status: DC
  Administered 2016-05-14: 125 mL/h via INTRAVENOUS

## 2016-05-14 MED ORDER — SODIUM CHLORIDE 0.9 % IV SOLN
INTRAVENOUS | Status: DC
  Administered 2016-05-14: 5.4 [IU]/h via INTRAVENOUS
  Filled 2016-05-14: qty 2.5

## 2016-05-14 MED ORDER — OXYCODONE HCL 5 MG PO TABS: 5.0000 mg | ORAL_TABLET | Freq: Four times a day (QID) | ORAL | Status: DC | PRN

## 2016-05-14 MED ORDER — ASPIRIN 325 MG PO TABS
325.0000 mg | ORAL_TABLET | Freq: Every day | ORAL | Status: DC
  Administered 2016-05-16 – 2016-05-21 (×6): 325 mg via ORAL
  Filled 2016-05-14 (×6): qty 1

## 2016-05-14 MED ORDER — CALCITRIOL 0.25 MCG PO CAPS
0.2500 ug | ORAL_CAPSULE | Freq: Two times a day (BID) | ORAL | Status: DC
  Administered 2016-05-14 – 2016-05-21 (×13): 0.25 ug via ORAL
  Filled 2016-05-14 (×13): qty 1

## 2016-05-14 MED ORDER — SODIUM CHLORIDE 0.9 % IV SOLN: INTRAVENOUS | Status: DC

## 2016-05-14 MED ORDER — PANTOPRAZOLE SODIUM 40 MG PO TBEC
40.0000 mg | DELAYED_RELEASE_TABLET | Freq: Every day | ORAL | Status: DC
  Administered 2016-05-16 – 2016-05-18 (×3): 40 mg via ORAL
  Filled 2016-05-14 (×3): qty 1

## 2016-05-14 MED ORDER — SODIUM CHLORIDE 0.9 % IV SOLN: INTRAVENOUS | Status: AC

## 2016-05-14 MED ORDER — SODIUM BICARBONATE 8.4 % IV SOLN: 100.0000 meq | Freq: Once | INTRAVENOUS | Status: DC

## 2016-05-14 MED ORDER — SODIUM CHLORIDE 0.9 % IV BOLUS (SEPSIS)
1000.0000 mL | Freq: Once | INTRAVENOUS | Status: AC
  Administered 2016-05-14: 1000 mL via INTRAVENOUS

## 2016-05-14 MED ORDER — MIRTAZAPINE 15 MG PO TABS
15.0000 mg | ORAL_TABLET | Freq: Every day | ORAL | Status: DC
  Administered 2016-05-14: 15 mg via ORAL
  Filled 2016-05-14: qty 1

## 2016-05-14 MED ORDER — GUAIFENESIN-DM 100-10 MG/5ML PO SYRP
5.0000 mL | ORAL_SOLUTION | ORAL | Status: DC | PRN
  Administered 2016-05-14: 5 mL via ORAL
  Filled 2016-05-14: qty 5

## 2016-05-14 NOTE — ED Provider Notes (Signed)
[facility] DEPT Provider Note   CSN: [id number] Arrival date & time: 05/14/16  1243     History   Chief Complaint Chief Complaint  Patient presents with  . Cough    HPI Priscilla [name] is a 46 y.o. female.  HPI Pt with hx of IDDM, ESRD on peritoneal dialysis, h/o renal transplant and pancreatic transplant, ICM comes in with cc of weakness. Pt reports that she has been feeling sick the last 7-10 days. Pt has cough. She has missed few doses of her insulin because she has been sick. She also missed yday's PD. Pt decided to come to the ER as she is getting worse. No sick contacts. Pt denies fevers, chills. Pt has no abd pain.   Past Medical History:  Diagnosis Date  . Anemia   . Cardiomyopathy, ischemic 06/24/2014  . Diabetes mellitus without complication ([place])   . End stage renal disease ([place])   . GERD (gastroesophageal reflux disease)   . H/O kidney transplant   . Hyperlipidemia   . Hypertension   . Pancreas transplanted ([place])   . Renal disorder   . 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 S/P mitral valve repair 07/18/14    Patient Active Problem List   Diagnosis Date Noted  . DKA (diabetic ketoacidoses) ([place]) 05/14/2016  . Essential hypertension 11/28/2014  . 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 Mitral regurgitation 06/29/2014  . Abnormal EKG   . Coronary artery disease involving native coronary artery of native heart without angina pectoris   . Cardiomyopathy, ischemic 06/24/2014  . Abnormal nuclear cardiac imaging test 06/24/2014  . 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 ESRD (end stage renal disease) ([place]) 09/24/2012  . Diabetes mellitus type I ([place]) 09/24/2012  . 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 Pancreatitis of pancreas transplant 09/24/2012    Past Surgical History:  Procedure Laterality Date  . CAPD INSERTION N/A 09/08/2013   Procedure: Laparoscopic CAPD peritoneal dialysis catheter placement, possible lysis of  adhesions   ;  Surgeon: Adin Hector, MD;  Location: [facility];  Service: General;  Laterality: N/A;  . CARDIAC CATHETERIZATION     06/2014  . CESAREAN SECTION    . COMBINED KIDNEY-PANCREAS TRANSPLANT  05/2010   [facility]  Pancreas left mid abdomen  . CORONARY ARTERY BYPASS GRAFT N/A 07/18/2014   Procedure: CORONARY ARTERY BYPASS GRAFTING (CABG)X2 LIMA-LAD; SVG-PD;  Surgeon: Melrose Nakayama, MD;  Location: [facility];  Service: Open Heart Surgery;  Laterality: N/A;  . INSERTION OF DIALYSIS CATHETER  September 28, 2012   Right upper chest  . LAPAROSCOPIC INSERTION PERITONEAL CATHETER  07/18/2008   Dr Excell Seltzer  . LAPAROSCOPIC REPOSITIONING CAPD CATHETER  03-06-202010   Dr Excell Seltzer  . LAPAROSCOPIC REPOSITIONING CAPD CATHETER  11/08/2008   Dr Excell Seltzer  . LAPAROSCOPIC REPOSITIONING CAPD CATHETER  10/03/2009   Unplugging CAPD catheter Dr Excell Seltzer  . LEFT HEART CATHETERIZATION WITH CORONARY ANGIOGRAM N/A 06/28/2014   Procedure: LEFT HEART CATHETERIZATION WITH CORONARY ANGIOGRAM;  Surgeon: [name], MD;  Location: [facility] CATH [facility];  Service: Cardiovascular;  Laterality: N/A;  . MITRAL VALVE REPAIR N/A 07/18/2014   Procedure: MITRAL VALVE REPAIR (MVR);  Surgeon: Melrose Nakayama, MD;  Location: [facility];  Service: Open Heart Surgery;  Laterality: N/A;  . OMENTECTOMY  11/08/2008   Dr Excell Seltzer  . TEE WITHOUT CARDIOVERSION N/A 07/18/2014   Procedure: TRANSESOPHAGEAL ECHOCARDIOGRAM (TEE);  Surgeon: Melrose Nakayama, MD;  Location: [facility];  Service: Open Heart Surgery;  Laterality: N/A;    OB History    No data available       Home Medications    Prior to Admission medications   Medication Sig Start Date End Date Taking? Authorizing Provider  acetaminophen (TYLENOL) 325 MG tablet Take 650 mg by mouth every 4 (four) hours as needed for mild pain.   Yes Historical Provider, MD  aspirin 325 MG tablet Take 325 mg by mouth daily.  05/31/10  Yes Historical Provider, MD  calcitRIOL (ROCALTROL) 0.25  MCG capsule Take 1 capsule (0.25 mcg total) by mouth daily. Patient taking differently: Take 0.25 mcg by mouth 2 (two) times daily.  07/25/14  Yes Donielle Liston Alba, PA-C  calcium acetate (PHOSLO) 667 MG capsule Take 2,001 mg by mouth 3 (three) times daily with meals.   Yes Historical Provider, MD  carvedilol (COREG) 6.25 MG tablet TAKE 1/2 TABLET TWICE A DAY X 3 WEEKS THEN INCREASE TO 1 TABLET TWICE A DAY Patient taking differently: Take 6.25 mg by mouth 2 (two) times daily with a meal.  10/20/14  Yes Mihai Croitoru, MD  gabapentin (NEURONTIN) 100 MG capsule Take 100 mg by mouth daily.   Yes Historical Provider, MD  hydrALAZINE (APRESOLINE) 10 MG tablet Take 10 mg by mouth 3 (three) times daily.   Yes Historical Provider, MD  ibuprofen (ADVIL,MOTRIN) 200 MG tablet Take 400 mg by mouth every 6 (six) hours as needed for headache (or pain).   Yes Historical Provider, MD  insulin glargine (LANTUS) 100 UNIT/ML injection Inject 10 Units into the skin at bedtime.    Yes Historical Provider, MD  insulin lispro (HUMALOG) 100 UNIT/ML injection Inject 10-15 Units into the skin 3 (three) times daily before meals. Per sliding scale   Yes Historical Provider, MD  IUD'S IU by Intrauterine route.   Yes Historical Provider, MD  loperamide (IMODIUM) 2 MG capsule Take 2 mg by mouth every 8 (eight) hours as needed for diarrhea or loose stools.   Yes Historical Provider, MD  losartan (COZAAR) 100 MG tablet Take 100 mg by mouth daily.   Yes Historical Provider, MD  mirtazapine (REMERON) 15 MG tablet Take 15 mg by mouth at bedtime.   Yes Historical Provider, MD  omeprazole (PRILOSEC) 20 MG capsule Take 1 capsule (20 mg total) by mouth daily as needed. Patient taking differently: Take 20 mg by mouth daily as needed (for heartburn/reflux).  07/25/14 05/14/16 Yes Donielle Liston Alba, PA-C  potassium chloride (KLOR-CON) 20 MEQ packet Take 40 mEq by mouth 2 (two) times daily.   Yes Historical Provider, MD  simvastatin (ZOCOR)  20 MG tablet Take 1 tablet (20 mg total) by mouth at bedtime. PLEASE CONTACT OFFICE FOR ADDITIONAL REFILLS FINAL WARNING Patient taking differently: Take 20 mg by mouth at bedtime.  03/14/16  Yes Mihai Croitoru, MD  MOVIPREP 100 G SOLR Take 1 kit (200 g total) by mouth once. Patient not taking: Reported on 05/14/2016 01/13/15   Lori P Hvozdovic, PA-C  oxyCODONE (OXY IR/ROXICODONE) 5 MG immediate release tablet Take 1-2 tablets (5-10 mg total) by mouth every 6 (six) hours as needed for severe pain. Patient not taking: Reported on 05/14/2016 08/02/14   Melrose Nakayama, MD    Family History Family History  Problem Relation Age of Onset  . CAD Mother   . Stroke Mother   . COPD Father   . Colon cancer Neg Hx     Social History Social History  Substance Use Topics  .  Smoking status: Never Smoker  . Smokeless tobacco: Never Used  . Alcohol use No     Allergies   Pork-derived products   Review of Systems Review of Systems  ROS 10 Systems reviewed and are negative for acute change except as noted in the HPI.     Physical Exam Updated Vital Signs BP 168/96   Pulse 65   Temp 97.9 F (36.6 C) (Oral)   Resp 25   Ht 5' (1.524 m)   Wt 117 lb (53.1 kg)   SpO2 99%   BMI 22.85 kg/m   Physical Exam  Constitutional: She is oriented to person, place, and time. She appears well-developed and well-nourished.  HENT:  Head: Normocephalic and atraumatic.  Eyes: EOM are normal. Pupils are equal, round, and reactive to light.  Neck: Neck supple.  Cardiovascular: Regular rhythm and normal heart sounds.   Pulmonary/Chest: Effort normal. No respiratory distress.  Abdominal: Soft. She exhibits no distension. There is no tenderness. There is no rebound and no guarding.  Neurological: She is alert and oriented to person, place, and time.  Skin: Skin is warm and dry.  Nursing note and vitals reviewed.    ED Treatments / Results  Labs (all labs ordered are listed, but only abnormal  results are displayed) Labs Reviewed  COMPREHENSIVE METABOLIC PANEL - Abnormal; Notable for the following:       Result Value   Sodium 118 (*)    Chloride 81 (*)    CO2 15 (*)    Glucose, Bld 878 (*)    BUN 61 (*)    Creatinine, Ser 14.72 (*)    Calcium 8.2 (*)    Albumin 2.9 (*)    AST 110 (*)    ALT 109 (*)    GFR calc non Af Amer 3 (*)    GFR calc Af Amer 3 (*)    Anion gap 22 (*)    All other components within normal limits  CBC WITH DIFFERENTIAL/PLATELET - Abnormal; Notable for the following:    Hemoglobin 10.6 (*)    HCT 31.8 (*)    MCV 77.8 (*)    MCH 25.9 (*)    RDW 15.9 (*)    Neutro Abs 8.2 (*)    All other components within normal limits  PROTIME-INR - Abnormal; Notable for the following:    Prothrombin Time 17.4 (*)    All other components within normal limits  I-STAT CG4 LACTIC ACID, ED - Abnormal; Notable for the following:    Lactic Acid, Venous 4.56 (*)    All other components within normal limits  I-STAT VENOUS BLOOD GAS, ED - Abnormal; Notable for the following:    pCO2, Ven 43.4 (*)    pO2, Ven 24.0 (*)    Acid-base deficit 7.0 (*)    All other components within normal limits  I-STAT CHEM 8, ED - Abnormal; Notable for the following:    Sodium 118 (*)    Potassium 5.2 (*)    Chloride 84 (*)    BUN 58 (*)    Creatinine, Ser 15.70 (*)    Glucose, Bld >700 (*)    Calcium, Ion 0.93 (*)    All other components within normal limits  I-STAT CG4 LACTIC ACID, ED - Abnormal; Notable for the following:    Lactic Acid, Venous 4.05 (*)    All other components within normal limits  I-STAT CG4 LACTIC ACID, ED - Abnormal; Notable for the following:    Lactic Acid, Venous  4.05 (*)    All other components within normal limits  I-STAT VENOUS BLOOD GAS, ED - Abnormal; Notable for the following:    pCO2, Ven 43.4 (*)    pO2, Ven 24.0 (*)    Acid-base deficit 7.0 (*)    All other components within normal limits  CBG MONITORING, ED - Abnormal; Notable for the  following:    Glucose-Capillary >600 (*)    All other components within normal limits  URINE CULTURE  URINALYSIS, ROUTINE W REFLEX MICROSCOPIC  CBG MONITORING, ED    EKG  EKG Interpretation None       Radiology Dg Chest 2 View  Result Date: 05/14/2016 CLINICAL DATA:  Cough. EXAM: CHEST  2 VIEW COMPARISON:  08/30/2014. FINDINGS: Mediastinum and hilar structures normal. Prior cardiac valve replacement and CABG. Cardiomegaly. Mild pulmonary vascular prominence and bilateral interstitial prominence with bilateral pleural effusions. Findings consistent with congestive heart failure. IMPRESSION: 1. Prior CABG.  Prior cardiac valve replacement. 2. Congestive heart failure with bilateral pulmonary interstitial edema and bilateral pleural effusions. Electronically Signed   By: Marcello Moores  Register   On: 05/14/2016 13:53    Procedures Procedures (including critical care time)  CRITICAL CARE Performed by: Varney Biles   Total critical care time: 50 minutes  Critical care time was exclusive of separately billable procedures and treating other patients.  Critical care was necessary to treat or prevent imminent or life-threatening deterioration.  Critical care was time spent personally by me on the following activities: development of treatment plan with patient and/or surrogate as well as nursing, discussions with consultants, evaluation of patient's response to treatment, examination of patient, obtaining history from patient or surrogate, ordering and performing treatments and interventions, ordering and review of laboratory studies, ordering and review of radiographic studies, pulse oximetry and re-evaluation of patient's condition.   Medications Ordered in ED Medications  insulin regular (NOVOLIN R,HUMULIN R) 250 Units in sodium chloride 0.9 % 250 mL (1 Units/mL) infusion (5.4 Units/hr Intravenous New Bag/Given 05/14/16 1621)  dextrose 5 %-0.45 % sodium chloride infusion (not  administered)  sodium chloride 0.9 % bolus 1,000 mL (1,000 mLs Intravenous New Bag/Given 05/14/16 1545)    And  0.9 %  sodium chloride infusion (not administered)  benzonatate (TESSALON) capsule 100 mg (not administered)     Initial Impression / Assessment and Plan / ED Course  I have reviewed the triage vital signs and the nursing notes.  Pertinent labs & imaging results that were available during my care of the patient were reviewed by me and considered in my medical decision making (see chart for details).  Clinical Course    Pt is in DKA. The underlying etiology in unclear. CXR shows pulm congestion, likely due to missed PD, more importantly, there is no infiltrate. WC is normal, and there is no fever - so we will hold of on  Antibiotics as there is no clear evidence of source of infection.  The patient is noted to have a lactate>4. With the current information available to me, I don't think the patient is in septic shock. The lactate>4, is related to OTHER SHOCK Diabetic Ketoacidosis.    Final Clinical Impressions(s) / ED Diagnoses   Final diagnoses:  Type 1 diabetes mellitus with ketoacidosis without coma [facility])    New Prescriptions New Prescriptions   No medications on file     Varney Biles, MD 05/14/16 1720

## 2016-05-14 NOTE — Progress Notes (Signed)
A critical troponin of 0.53 was called by labs at 2117. Hospitalist notified.

## 2016-05-14 NOTE — ED Triage Notes (Signed)
Per Pt, Pt is coming from home with complaints of cough for over a week with dry heaves and generalized body aches. Denies fever, but pt has Hx of Heart transplant and kidney failure.

## 2016-05-14 NOTE — H&P (Signed)
History and Physical    Priscilla [name] QGB:[id number] DOB: 17-Feb-1971 DOA: 05/14/2016  PCP: [name], MD Patient coming from: home  Chief Complaint: talking out of my head and cough  HPI: Priscilla [name] is a 46 y.o. 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 Patient presenting with 2-3 day of increasing confusion. Patient states"I have been talking out of my head." Denies visual or auditory hallucinations. Patient states she simply feels groggy. Getting worse. Denies chest pain, palpitations, shortness of breath, general malaise, fevers, abdominal pain, dysuria, frequency, diarrhea, nausea, vomiting, LOC. Patient also complaining of two-week history of dry cough. Alka-Seltzer cough and cold with some improvement. Patient feels that overall he symptoms are getting better. Patient has not had her typical peritoneal dialysis for the last 2 days due to her symptoms as outlined above. Patient states that at baseline her blood glucose readings are typically in the 200s but over the last 1-2 days they have been too high to read on the glucose meter. Patient has continued to take her maximum dose of insulin, 10 units, with meals without any improvement. Patient states that there have been times when she has been told to titrate her dose of insulin but has not done so.   ED Course: objective findings outlined below. Started on insulin drip   Review of Systems: As per HPI otherwise 10 point review of systems negative.   Ambulatory Status: no restrictions  Past Medical History:  Diagnosis Date  . Anemia   . Cardiomyopathy, ischemic 06/24/2014  . Diabetes mellitus without complication ([place])   . End stage renal disease ([place])   . GERD (gastroesophageal reflux disease)   . H/O kidney transplant   . Hyperlipidemia   . Hypertension   . Pancreas  transplanted ([place])   . Renal disorder   . 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 S/P mitral valve repair 07/18/14    Past Surgical History:  Procedure Laterality Date  . CAPD INSERTION N/A 09/08/2013   Procedure: Laparoscopic CAPD peritoneal dialysis catheter placement, possible lysis of adhesions   ;  Surgeon: Adin Hector, MD;  Location: [place];  Service: General;  Laterality: N/A;  . CARDIAC CATHETERIZATION     06/2014  . CESAREAN SECTION    . COMBINED KIDNEY-PANCREAS TRANSPLANT  05/2010   [facility]..  Pancreas left mid abdomen  . CORONARY ARTERY BYPASS GRAFT N/A 07/18/2014   Procedure: CORONARY ARTERY BYPASS GRAFTING (CABG)X2 LIMA-LAD; SVG-PD;  Surgeon: Melrose Nakayama, MD;  Location: [place];  Service: Open Heart Surgery;  Laterality: N/A;  . INSERTION OF DIALYSIS CATHETER  September 28, 2012   Right upper chest  . LAPAROSCOPIC INSERTION PERITONEAL CATHETER  07/18/2008   Dr Excell Seltzer  . LAPAROSCOPIC REPOSITIONING CAPD CATHETER  04/24/202010   Dr Excell Seltzer  . LAPAROSCOPIC REPOSITIONING CAPD CATHETER  11/08/2008   Dr Excell Seltzer  . LAPAROSCOPIC REPOSITIONING CAPD CATHETER  10/03/2009   Unplugging CAPD catheter Dr Excell Seltzer  . LEFT HEART CATHETERIZATION WITH CORONARY ANGIOGRAM N/A 06/28/2014   Procedure: LEFT HEART CATHETERIZATION WITH CORONARY ANGIOGRAM;  Surgeon: [name], MD;  Location: [place] CATH [facility];  Service: Cardiovascular;  Laterality: N/A;  . MITRAL VALVE REPAIR N/A 07/18/2014   Procedure: MITRAL VALVE REPAIR (MVR);  Surgeon: Melrose Nakayama, MD;  Location: [place];  Service: Open Heart Surgery;  Laterality: N/A;  . OMENTECTOMY  11/08/2008   Dr Excell Seltzer  .  TEE WITHOUT CARDIOVERSION N/A 07/18/2014   Procedure: TRANSESOPHAGEAL ECHOCARDIOGRAM (TEE);  Surgeon: Melrose Nakayama, MD;  Location: [place];  Service: Open Heart Surgery;  Laterality: N/A;    Social History   Social History  . Marital status: Divorced    Spouse name: N/A  . Number of children: 1  . Years of education: N/A    Occupational History  . Manager for social services    Social History Main Topics  . Smoking status: Never Smoker  . Smokeless tobacco: Never Used  . Alcohol use No  . Drug use: No  . Sexual activity: Not Currently    Birth control/ protection: None   Other Topics Concern  . Not on file   Social History Narrative  . No narrative on file    Allergies  Allergen Reactions  . Pork-Derived Products Other (See Comments)    Pt is [place]    Family History  Problem Relation Age of Onset  . CAD Mother   . Stroke Mother   . COPD Father   . Colon cancer Neg Hx     Prior to Admission medications   Medication Sig Start Date End Date Taking? Authorizing Provider  acetaminophen (TYLENOL) 325 MG tablet Take 650 mg by mouth every 4 (four) hours as needed for mild pain.   Yes Historical Provider, MD  aspirin 325 MG tablet Take 325 mg by mouth daily.  05/31/10  Yes Historical Provider, MD  calcitRIOL (ROCALTROL) 0.25 MCG capsule Take 1 capsule (0.25 mcg total) by mouth daily. Patient taking differently: Take 0.25 mcg by mouth 2 (two) times daily.  07/25/14  Yes Donielle Liston Alba, PA-C  calcium acetate (PHOSLO) 667 MG capsule Take 2,001 mg by mouth 3 (three) times daily with meals.   Yes Historical Provider, MD  carvedilol (COREG) 6.25 MG tablet TAKE 1/2 TABLET TWICE A DAY X 3 WEEKS THEN INCREASE TO 1 TABLET TWICE A DAY Patient taking differently: Take 6.25 mg by mouth 2 (two) times daily with a meal.  10/20/14  Yes Mihai Croitoru, MD  gabapentin (NEURONTIN) 100 MG capsule Take 100 mg by mouth daily.   Yes Historical Provider, MD  hydrALAZINE (APRESOLINE) 10 MG tablet Take 10 mg by mouth 3 (three) times daily.   Yes Historical Provider, MD  ibuprofen (ADVIL,MOTRIN) 200 MG tablet Take 400 mg by mouth every 6 (six) hours as needed for headache (or pain).   Yes Historical Provider, MD  insulin glargine (LANTUS) 100 UNIT/ML injection Inject 10 Units into the skin at bedtime.    Yes Historical  Provider, MD  insulin lispro (HUMALOG) 100 UNIT/ML injection Inject 10-15 Units into the skin 3 (three) times daily before meals. Per sliding scale   Yes Historical Provider, MD  IUD'S IU by Intrauterine route.   Yes Historical Provider, MD  loperamide (IMODIUM) 2 MG capsule Take 2 mg by mouth every 8 (eight) hours as needed for diarrhea or loose stools.   Yes Historical Provider, MD  losartan (COZAAR) 100 MG tablet Take 100 mg by mouth daily.   Yes Historical Provider, MD  mirtazapine (REMERON) 15 MG tablet Take 15 mg by mouth at bedtime.   Yes Historical Provider, MD  omeprazole (PRILOSEC) 20 MG capsule Take 1 capsule (20 mg total) by mouth daily as needed. Patient taking differently: Take 20 mg by mouth daily as needed (for heartburn/reflux).  07/25/14 05/14/16 Yes Donielle Liston Alba, PA-C  potassium chloride (KLOR-CON) 20 MEQ packet Take 40 mEq by mouth 2 (two)  times daily.   Yes Historical Provider, MD  simvastatin (ZOCOR) 20 MG tablet Take 1 tablet (20 mg total) by mouth at bedtime. PLEASE CONTACT OFFICE FOR ADDITIONAL REFILLS FINAL WARNING Patient taking differently: Take 20 mg by mouth at bedtime.  03/14/16  Yes Mihai Croitoru, MD  MOVIPREP 100 G SOLR Take 1 kit (200 g total) by mouth once. Patient not taking: Reported on 05/14/2016 01/13/15   Lori P Hvozdovic, PA-C  oxyCODONE (OXY IR/ROXICODONE) 5 MG immediate release tablet Take 1-2 tablets (5-10 mg total) by mouth every 6 (six) hours as needed for severe pain. Patient not taking: Reported on 05/14/2016 08/02/14   Melrose Nakayama, MD    Physical Exam: Vitals:   05/14/16 1457 05/14/16 1458 05/14/16 1500 05/14/16 1515  BP: 171/93  180/100 168/96  Pulse: 66 66 64 65  Resp:  _0 Temp:      TempSrc:      SpO2: 97% 99% 98% 99%  Weight:      Height:         General:  Appears calm and comfortable Eyes:  PERRL, EOMI, normal lids, iris ENT:  grossly normal hearing, lips & tongue, mmm Neck:  no LAD, masses or  thyromegaly Cardiovascular:  RRR, no m/r/g. No LE edema.  Respiratory:  CTA bilaterally, II/VI systolic murmur. Normal respiratory effort. Abdomen:  soft, ntnd, NABS Skin:  no rash or induration seen on limited exam Musculoskeletal:  grossly normal tone BUE/BLE, good ROM, no bony abnormality Psychiatric:  grossly normal mood and affect, speech fluent and appropriate, AOx3 Neurologic:  CN 2-12 grossly intact, moves all extremities in coordinated fashion, sensation intact  Labs on Admission: I have personally reviewed following labs and imaging studies  CBC:  Recent Labs Lab 05/14/16 1254 05/14/16 1542  WBC 9.7  --   NEUTROABS 8.2*  --   HGB 10.6* 12.2  HCT 31.8* 36.0  MCV 77.8*  --   PLT 170  --    Basic Metabolic Panel:  Recent Labs Lab 05/14/16 1254 05/14/16 1542  NA 118* 118*  K 5.1 5.2*  CL 81* 84*  CO2 15*  --   GLUCOSE 878* >700*  BUN 61* 58*  CREATININE 14.72* 15.70*  CALCIUM 8.2*  --    GFR: Estimated Creatinine Clearance: 3.3 mL/min (by C-G formula based on SCr of 15.7 mg/dL (H)). Liver Function Tests:  Recent Labs Lab 05/14/16 1254  AST 110*  ALT 109*  ALKPHOS 97  BILITOT 0.8  PROT 6.7  ALBUMIN 2.9*   No results for input(s): LIPASE, AMYLASE in the last 168 hours. No results for input(s): AMMONIA in the last 168 hours. Coagulation Profile:  Recent Labs Lab 05/14/16 1519  INR 1.42   Cardiac Enzymes: No results for input(s): CKTOTAL, CKMB, CKMBINDEX, TROPONINI in the last 168 hours. BNP (last 3 results) No results for input(s): PROBNP in the last 8760 hours. HbA1C: No results for input(s): HGBA1C in the last 72 hours. CBG:  Recent Labs Lab 05/14/16 1604 05/14/16 1739  GLUCAP >600* >600*   Lipid Profile: No results for input(s): CHOL, HDL, LDLCALC, TRIG, CHOLHDL, LDLDIRECT in the last 72 hours. Thyroid Function Tests: No results for input(s): TSH, T4TOTAL, FREET4, T3FREE, THYROIDAB in the last 72 hours. Anemia Panel: No results  for input(s): VITAMINB12, FOLATE, FERRITIN, TIBC, IRON, RETICCTPCT in the last 72 hours. Urine analysis:    Component Value Date/Time   COLORURINE YELLOW 09/24/2012 2007   APPEARANCEUR CLOUDY (A) 09/24/2012 2007   LABSPEC  1.008 09/24/2012 2007   PHURINE 7.0 09/24/2012 2007   GLUCOSEU NEGATIVE 09/24/2012 2007   HGBUR TRACE (A) 09/24/2012 2007   BILIRUBINUR NEGATIVE 09/24/2012 2007   KETONESUR NEGATIVE 09/24/2012 2007   PROTEINUR 100 (A) 09/24/2012 2007   UROBILINOGEN 0.2 09/24/2012 2007   NITRITE NEGATIVE 09/24/2012 2007   LEUKOCYTESUR SMALL (A) 09/24/2012 2007    Creatinine Clearance: Estimated Creatinine Clearance: 3.3 mL/min (by C-G formula based on SCr of 15.7 mg/dL (H)).  Sepsis Labs: _0 (procalcitonin:4,lacticidven:4) )No results found for this or any previous visit (from the past 240 hour(s)).   Radiological Exams on Admission: Dg Chest 2 View  Result Date: 05/14/2016 CLINICAL DATA:  Cough. EXAM: CHEST  2 VIEW COMPARISON:  08/30/2014. FINDINGS: Mediastinum and hilar structures normal. Prior cardiac valve replacement and CABG. Cardiomegaly. Mild pulmonary vascular prominence and bilateral interstitial prominence with bilateral pleural effusions. Findings consistent with congestive heart failure. IMPRESSION: 1. Prior CABG.  Prior cardiac valve replacement. 2. Congestive heart failure with bilateral pulmonary interstitial edema and bilateral pleural effusions. Electronically Signed   By: Marcello Moores  Register   On: 05/14/2016 13:53    EKG: Pending   Assessment/Plan Active Problems:   Kidney transplant ([facility]) failure and rejection   ESRD (end stage renal disease) ([place])   Diabetes mellitus type I ([place])   S/P CABG x 2   DKA (diabetic ketoacidoses) ([facility])   Microcytic anemia   Hyponatremia   Transaminitis   DKA: Patient with very poorly controlled diabetic and seems to have extremely poor insight into her condition and how to properly manage hyperglycemic states. VBG with  pH of 7.27, PCO2 43, PO2 24, glucose greater than 800, anion gap greater than 22, bicarbonate 15, lactic acid 4.56. Patient's condition will likely improve very slowly given her ESRD. This is likely the cause of pts intermittent confusional state - at time of my exam pts neuro exam is completely normal - Stepdown - Glucose stabilizer with gentle fluid resuscitation - Frequent BMP - Bicarbonate - Resume home regimen when stable.  - Diabetes educator - pt w/ very poor insight into her DM and how to titrate home insulin.   ESRD: On peritoneal dialysis. Has not had dialysis in the last 2-3 days. Discussed case with Dr. Justin Mend of nephrology who will evaluate the patient for ongoing dialysis. - Peritoneal dialysis per nephrology  Hyponatremia: 118 on initial blood work with a glucose of 878. Corrected glucose likely in the 133 range. - Treatment of hypoglycemia as above with frequent monitoring  Transaminitis: AST 110, ALT 109. Suspect secondary to acute metabolic dysfunction and acidosis. - Trend - Hold statin until returns to baseline - Treatment as above  CAD/CABG:  - Tropx1, EKG - continue statin when liver function normalizes - continue ASA  Anemia: 10.6. At baseline. Microcytic. - Anemia panel - CBC in a.m.  GERD: - continue ppi  Neuropathic/Chronic pain: - continue oxycodone, neurontin  HTN: - continue coreg, hydralazine  Depression: - continue remeron  Mild chronic diastolic CHF: Last echo showing EF of 55% and mild LVH. CXR concerning for congestive heart failure with bilateral pulmonary interstitial edema and bilateral pleural effusions. Mild. No O2 demand at this time. Patient has missed dialysis for the last 2-3 days which is likely contributing to current state. - Strict I/O, daily wts - Echo - Dialysis per nephrology - repeat Echo in am   DVT prophylaxis: hep  Code Status: full  Family Communication: mother and son  Disposition Plan: pending improvement    Consults  called: none  Admission status: inpt, sdu    Breanda Greenlaw J MD [facility]  If 7PM-7AM, please contact night-coverage [email or web address] Password [facility]  05/14/2016, 6:05 PM

## 2016-05-14 NOTE — Consult Note (Signed)
[facility] Renal Consultation Note  Indication for Consultation:  Management of ESRD/hemodialysis; anemia, hypertension/volume and secondary hyperparathyroidism  HPI: Priscilla [name] is a 46 y.o. female with ESRD sec DM on CCPD past 2 years (followed by Dr. Alfonso Ellis) admitted with Hyperglycemia/ DKA/ AMS..Came to ER with confusion and she reports High/High BS on monitor past 2 days. She reports no PD exchanges  Past 24 hrs.,denies sob, N/V/D. Fevers, chills,or pain with PD exchanges .She denies dc at PD site and reports clear PD fluid with last exchange. Noted last visit  With Dr. 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 In ER  Room are son and sister , she is confused to year, day, month and yesterday Alcus Dad king holiday. She is able to identify  Her family members in ER   And admits to confusion herself.  We are consulted for ESRD / PD issues..      Past Medical History:  Diagnosis Date  . Anemia   . Cardiomyopathy, ischemic 06/24/2014  . Diabetes mellitus without complication ([place])   . End stage renal disease ([place])   . GERD (gastroesophageal reflux disease)   . H/O kidney transplant   . Hyperlipidemia   . Hypertension   . Pancreas transplanted ([place])   . Renal disorder   . 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 S/P mitral valve repair 07/18/14    Past Surgical History:  Procedure Laterality Date  . CAPD INSERTION N/A 09/08/2013   Procedure: Laparoscopic CAPD peritoneal dialysis catheter placement, possible lysis of adhesions   ;  Surgeon: Adin Hector, MD;  Location: [facility];  Service: General;  Laterality: N/A;  . CARDIAC CATHETERIZATION     06/2014  . CESAREAN SECTION    . COMBINED KIDNEY-PANCREAS TRANSPLANT  05/2010   [facility]..  Pancreas left mid abdomen  . CORONARY ARTERY BYPASS GRAFT N/A 07/18/2014   Procedure: CORONARY ARTERY BYPASS GRAFTING (CABG)X2 LIMA-LAD; SVG-PD;  Surgeon: Melrose Nakayama, MD;  Location: [facility];   Service: Open Heart Surgery;  Laterality: N/A;  . INSERTION OF DIALYSIS CATHETER  September 28, 2012   Right upper chest  . LAPAROSCOPIC INSERTION PERITONEAL CATHETER  07/18/2008   Dr Excell Seltzer  . LAPAROSCOPIC REPOSITIONING CAPD CATHETER  2020/05/2308   Dr Excell Seltzer  . LAPAROSCOPIC REPOSITIONING CAPD CATHETER  11/08/2008   Dr Excell Seltzer  . LAPAROSCOPIC REPOSITIONING CAPD CATHETER  10/03/2009   Unplugging CAPD catheter Dr Excell Seltzer  . LEFT HEART CATHETERIZATION WITH CORONARY ANGIOGRAM N/A 06/28/2014   Procedure: LEFT HEART CATHETERIZATION WITH CORONARY ANGIOGRAM;  Surgeon: [name], MD;  Location: [facility];  Service: Cardiovascular;  Laterality: N/A;  . MITRAL VALVE REPAIR N/A 07/18/2014   Procedure: MITRAL VALVE REPAIR (MVR);  Surgeon: Melrose Nakayama, MD;  Location: [facility];  Service: Open Heart Surgery;  Laterality: N/A;  . OMENTECTOMY  11/08/2008   Dr Excell Seltzer  . TEE WITHOUT CARDIOVERSION N/A 07/18/2014   Procedure: TRANSESOPHAGEAL ECHOCARDIOGRAM (TEE);  Surgeon: Melrose Nakayama, MD;  Location: [place] [facility];  Service: Open Heart Surgery;  Laterality: N/A;      Family History  Problem Relation Age of Onset  . CAD Mother   . Stroke Mother   . COPD Father   . Colon cancer Neg Hx       reports that she has never smoked. She has never used smokeless tobacco. She reports that she does not drink alcohol or use drugs.   Allergies  Allergen Reactions  .  Pork-Derived Products Other (See Comments)    Pt is [place]    Prior to Admission medications   Medication Sig Start Date End Date Taking? Authorizing Provider  acetaminophen (TYLENOL) 325 MG tablet Take 650 mg by mouth every 4 (four) hours as needed for mild pain.   Yes Historical Provider, MD  aspirin 325 MG tablet Take 325 mg by mouth daily.  05/31/10  Yes Historical Provider, MD  calcitRIOL (ROCALTROL) 0.25 MCG capsule Take 1 capsule (0.25 mcg total) by mouth daily. Patient taking differently: Take 0.25 mcg by mouth 2 (two) times daily.   07/25/14  Yes Donielle Liston Alba, PA-C  calcium acetate (PHOSLO) 667 MG capsule Take 2,001 mg by mouth 3 (three) times daily with meals.   Yes Historical Provider, MD  carvedilol (COREG) 6.25 MG tablet TAKE 1/2 TABLET TWICE A DAY X 3 WEEKS THEN INCREASE TO 1 TABLET TWICE A DAY Patient taking differently: Take 6.25 mg by mouth 2 (two) times daily with a meal.  10/20/14  Yes Mihai Croitoru, MD  gabapentin (NEURONTIN) 100 MG capsule Take 100 mg by mouth daily.   Yes Historical Provider, MD  hydrALAZINE (APRESOLINE) 10 MG tablet Take 10 mg by mouth 3 (three) times daily.   Yes Historical Provider, MD  ibuprofen (ADVIL,MOTRIN) 200 MG tablet Take 400 mg by mouth every 6 (six) hours as needed for headache (or pain).   Yes Historical Provider, MD  insulin glargine (LANTUS) 100 UNIT/ML injection Inject 10 Units into the skin at bedtime.    Yes Historical Provider, MD  insulin lispro (HUMALOG) 100 UNIT/ML injection Inject 10-15 Units into the skin 3 (three) times daily before meals. Per sliding scale   Yes Historical Provider, MD  IUD'S IU by Intrauterine route.   Yes Historical Provider, MD  loperamide (IMODIUM) 2 MG capsule Take 2 mg by mouth every 8 (eight) hours as needed for diarrhea or loose stools.   Yes Historical Provider, MD  losartan (COZAAR) 100 MG tablet Take 100 mg by mouth daily.   Yes Historical Provider, MD  mirtazapine (REMERON) 15 MG tablet Take 15 mg by mouth at bedtime.   Yes Historical Provider, MD  omeprazole (PRILOSEC) 20 MG capsule Take 1 capsule (20 mg total) by mouth daily as needed. Patient taking differently: Take 20 mg by mouth daily as needed (for heartburn/reflux).  07/25/14 05/14/16 Yes Donielle Liston Alba, PA-C  potassium chloride (KLOR-CON) 20 MEQ packet Take 40 mEq by mouth 2 (two) times daily.   Yes Historical Provider, MD  simvastatin (ZOCOR) 20 MG tablet Take 1 tablet (20 mg total) by mouth at bedtime. PLEASE CONTACT OFFICE FOR ADDITIONAL REFILLS FINAL WARNING Patient  taking differently: Take 20 mg by mouth at bedtime.  03/14/16  Yes Mihai Croitoru, MD  MOVIPREP 100 G SOLR Take 1 kit (200 g total) by mouth once. Patient not taking: Reported on 05/14/2016 01/13/15   Lori P Hvozdovic, PA-C  oxyCODONE (OXY IR/ROXICODONE) 5 MG immediate release tablet Take 1-2 tablets (5-10 mg total) by mouth every 6 (six) hours as needed for severe pain. Patient not taking: Reported on 05/14/2016 08/02/14   Melrose Nakayama, MD     Anti-infectives    None      Results for orders placed or performed during the hospital encounter of 05/14/16 (from the past 48 hour(s))  Comprehensive metabolic panel     Status: Abnormal   Collection Time: 05/14/16 12:54 PM  Result Value Ref Range   Sodium 118 (LL) 135 - 145  mmol/L    Comment: CRITICAL RESULT CALLED TO, READ BACK BY AND VERIFIED WITH: A.WHEELER,RN 05/14/16 1431 BY BSLADE    Potassium 5.1 3.5 - 5.1 mmol/L   Chloride 81 (L) 101 - 111 mmol/L   CO2 15 (L) 22 - 32 mmol/L   Glucose, Bld 878 (HH) 65 - 99 mg/dL    Comment: CRITICAL RESULT CALLED TO, READ BACK BY AND VERIFIED WITH: A.WHEELER,RN 05/14/16 1431 BY BSLADE    BUN 61 (H) 6 - 20 mg/dL   Creatinine, Ser 14.72 (H) 0.44 - 1.00 mg/dL   Calcium 8.2 (L) 8.9 - 10.3 mg/dL   Total Protein 6.7 6.5 - 8.1 g/dL   Albumin 2.9 (L) 3.5 - 5.0 g/dL   AST 110 (H) 15 - 41 U/L   ALT 109 (H) 14 - 54 U/L   Alkaline Phosphatase 97 38 - 126 U/L   Total Bilirubin 0.8 0.3 - 1.2 mg/dL   GFR calc non Af Amer 3 (L) >60 mL/min   GFR calc Af Amer 3 (L) >60 mL/min    Comment: (NOTE) The eGFR has been calculated using the CKD EPI equation. This calculation has not been validated in all clinical situations. eGFR's persistently <60 mL/min signify possible Chronic Kidney Disease.    Anion gap 22 (H) 5 - 15  CBC with Differential     Status: Abnormal   Collection Time: 05/14/16 12:54 PM  Result Value Ref Range   WBC 9.7 4.0 - 10.5 K/uL   RBC 4.09 3.87 - 5.11 MIL/uL   Hemoglobin 10.6 (L) 12.0 -  15.0 g/dL   HCT 31.8 (L) 36.0 - 46.0 %   MCV 77.8 (L) 78.0 - 100.0 fL   MCH 25.9 (L) 26.0 - 34.0 pg   MCHC 33.3 30.0 - 36.0 g/dL   RDW 15.9 (H) 11.5 - 15.5 %   Platelets 170 150 - 400 K/uL   Neutrophils Relative % 84 %   Neutro Abs 8.2 (H) 1.7 - 7.7 K/uL   Lymphocytes Relative 10 %   Lymphs Abs 1.0 0.7 - 4.0 K/uL   Monocytes Relative 5 %   Monocytes Absolute 0.5 0.1 - 1.0 K/uL   Eosinophils Relative 0 %   Eosinophils Absolute 0.0 0.0 - 0.7 K/uL   Basophils Relative 0 %   Basophils Absolute 0.0 0.0 - 0.1 K/uL  I-Stat CG4 Lactic Acid, ED     Status: Abnormal   Collection Time: 05/14/16  1:14 PM  Result Value Ref Range   Lactic Acid, Venous 4.56 (HH) 0.5 - 1.9 mmol/L   Comment NOTIFIED PHYSICIAN   Protime-INR     Status: Abnormal   Collection Time: 05/14/16  3:19 PM  Result Value Ref Range   Prothrombin Time 17.4 (H) 11.4 - 15.2 seconds   INR 1.42   I-Stat CG4 Lactic Acid, ED     Status: Abnormal   Collection Time: 05/14/16  3:29 PM  Result Value Ref Range   Lactic Acid, Venous 4.05 (HH) 0.5 - 1.9 mmol/L   Comment NOTIFIED PHYSICIAN   I-Stat CG4 Lactic Acid, ED     Status: Abnormal   Collection Time: 05/14/16  3:29 PM  Result Value Ref Range   Lactic Acid, Venous 4.05 (HH) 0.5 - 1.9 mmol/L   Comment NOTIFIED PHYSICIAN   I-Stat Venous Blood Gas, ED  (MC, MHP)     Status: Abnormal   Collection Time: 05/14/16  3:37 PM  Result Value Ref Range   pH, Ven 7.275 7.250 - 7.430  pCO2, Ven 43.4 (L) 44.0 - 60.0 mmHg   pO2, Ven 24.0 (LL) 32.0 - 45.0 mmHg   Bicarbonate 20.1 20.0 - 28.0 mmol/L   TCO2 21 0 - 100 mmol/L   O2 Saturation 36.0 %   Acid-base deficit 7.0 (H) 0.0 - 2.0 mmol/L   Patient temperature HIDE    Sample type VENOUS    Comment NOTIFIED PHYSICIAN   I-Stat venous blood gas, ED     Status: Abnormal   Collection Time: 05/14/16  3:37 PM  Result Value Ref Range   pH, Ven 7.275 7.250 - 7.430   pCO2, Ven 43.4 (L) 44.0 - 60.0 mmHg   pO2, Ven 24.0 (LL) 32.0 - 45.0 mmHg    Bicarbonate 20.1 20.0 - 28.0 mmol/L   TCO2 21 0 - 100 mmol/L   O2 Saturation 36.0 %   Acid-base deficit 7.0 (H) 0.0 - 2.0 mmol/L   Patient temperature HIDE    Sample type VENOUS    Comment NOTIFIED PHYSICIAN   I-stat chem 8, ed     Status: Abnormal   Collection Time: 05/14/16  3:42 PM  Result Value Ref Range   Sodium 118 (LL) 135 - 145 mmol/L   Potassium 5.2 (H) 3.5 - 5.1 mmol/L   Chloride 84 (L) 101 - 111 mmol/L   BUN 58 (H) 6 - 20 mg/dL   Creatinine, Ser 15.70 (H) 0.44 - 1.00 mg/dL   Glucose, Bld >700 (HH) 65 - 99 mg/dL   Calcium, Ion 0.93 (L) 1.15 - 1.40 mmol/L   TCO2 22 0 - 100 mmol/L   Hemoglobin 12.2 12.0 - 15.0 g/dL   HCT 36.0 36.0 - 46.0 %   Comment NOTIFIED PHYSICIAN   CBG monitoring, ED     Status: Abnormal   Collection Time: 05/14/16  4:04 PM  Result Value Ref Range   Glucose-Capillary >600 (HH) 65 - 99 mg/dL    ROS: see hpi  ho  Physical Exam: Vitals:   05/14/16 1500 05/14/16 1515  BP: 180/100 168/96  Pulse: 64 65  Resp: 17 25  Temp:       General: alert thin [name]  Chronically ill NAD HEENT: [place] , MM dry, eomi , nonicteric   Neck: no jvd , supple Heart: RRR, no mur, rub or gallop  Lungs: CTA  non labored breathing   Abdomen: BS pos, soft NT, ND Extremities: bilat trace pedal edema Skin: no pedal ulcers or overt rash  Neuro: alert orientated to place and person  Only , follows request, moves all extrem to requests Dialysis Access: PD cath no dc at site   Dialysis Orders: Center: CCPD edw= ( op web site not available , check inam  fopr pd )  Assessment/Plan 1. Uncontrolled DM /( bs 878)DKA - per admit team  2. ESRD -  CCPD  With k= 5.1  Will follow up with bs correction , hold ccpd exchanges tonight as dw Dr. Justin Mend until bs stabilizing  3. Hypertension/volume  - bp 180/100>168/96   bp meds and Needs uf with pd in am ? bp med compliance  4. Anemia  - hgb 10.6 fu  hgb trend  And check op records for esa 5. Metabolic bone disease -  Po vit d / corec  Ca 9.1   Phos lo binder , fu ca Maren Reamer  Trend 6. HO CM / MVR/ CABG X2 = no chest pain   7. HO failed kid and Pancreas TX = not on meds   Ernest Haber, PA-C  [place] [phone] 05/14/2016, 4:34 PM

## 2016-05-14 NOTE — ED Notes (Signed)
Pt's sister's phone numbers  [phone]  [phone]  (main number)

## 2016-05-14 NOTE — ED Notes (Signed)
Pt taken to room, assisted into a gown, and placed on the monitor.  Pt confused, walking to a wall and then the end of the bed when asked to sit on the bed.  Pt then was unable to lay down on the stretched without assistance or get undressed on her own.  Alert to self.

## 2016-05-15 ENCOUNTER — Inpatient Hospital Stay (HOSPITAL_COMMUNITY): Payer: Medicare Other

## 2016-05-15 DIAGNOSIS — N186 End stage renal disease: Secondary | ICD-10-CM

## 2016-05-15 DIAGNOSIS — E1010 Type 1 diabetes mellitus with ketoacidosis without coma: Secondary | ICD-10-CM

## 2016-05-15 DIAGNOSIS — R748 Abnormal levels of other serum enzymes: Secondary | ICD-10-CM

## 2016-05-15 DIAGNOSIS — Z789 Other specified health status: Secondary | ICD-10-CM

## 2016-05-15 DIAGNOSIS — E871 Hypo-osmolality and hyponatremia: Secondary | ICD-10-CM

## 2016-05-15 DIAGNOSIS — T8612 Kidney transplant failure: Secondary | ICD-10-CM

## 2016-05-15 DIAGNOSIS — Z951 Presence of aortocoronary bypass graft: Secondary | ICD-10-CM

## 2016-05-15 DIAGNOSIS — T8611 Kidney transplant rejection: Secondary | ICD-10-CM

## 2016-05-15 DIAGNOSIS — E0810 Diabetes mellitus due to underlying condition with ketoacidosis without coma: Secondary | ICD-10-CM

## 2016-05-15 LAB — CBC
HEMATOCRIT: 32.4 % — AB (ref 36.0–46.0)
Hemoglobin: 11.4 g/dL — ABNORMAL LOW (ref 12.0–15.0)
MCH: 26.3 pg (ref 26.0–34.0)
MCHC: 35.2 g/dL (ref 30.0–36.0)
MCV: 74.8 fL — AB (ref 78.0–100.0)
Platelets: 173 10*3/uL (ref 150–400)
RBC: 4.33 MIL/uL (ref 3.87–5.11)
RDW: 15.6 % — ABNORMAL HIGH (ref 11.5–15.5)
WBC: 15.3 10*3/uL — ABNORMAL HIGH (ref 4.0–10.5)

## 2016-05-15 LAB — BASIC METABOLIC PANEL
ANION GAP: 16 — AB (ref 5–15)
ANION GAP: 18 — AB (ref 5–15)
Anion gap: 22 — ABNORMAL HIGH (ref 5–15)
Anion gap: 22 — ABNORMAL HIGH (ref 5–15)
Anion gap: 19 — ABNORMAL HIGH (ref 5–15)
BUN: 63 mg/dL — AB (ref 6–20)
BUN: 63 mg/dL — ABNORMAL HIGH (ref 6–20)
BUN: 66 mg/dL — ABNORMAL HIGH (ref 6–20)
BUN: 67 mg/dL — ABNORMAL HIGH (ref 6–20)
BUN: 39 mg/dL — ABNORMAL HIGH (ref 6–20)
CALCIUM: 8.5 mg/dL — AB (ref 8.9–10.3)
CALCIUM: 8.2 mg/dL — AB (ref 8.9–10.3)
CALCIUM: 7.8 mg/dL — AB (ref 8.9–10.3)
CALCIUM: 7.6 mg/dL — AB (ref 8.9–10.3)
CHLORIDE: 86 mmol/L — AB (ref 101–111)
CHLORIDE: 86 mmol/L — AB (ref 101–111)
CHLORIDE: 88 mmol/L — AB (ref 101–111)
CHLORIDE: 91 mmol/L — AB (ref 101–111)
CO2: 18 mmol/L — ABNORMAL LOW (ref 22–32)
CO2: 18 mmol/L — AB (ref 22–32)
CO2: 18 mmol/L — AB (ref 22–32)
CO2: 18 mmol/L — AB (ref 22–32)
CO2: 19 mmol/L — AB (ref 22–32)
CREATININE: 14.51 mg/dL — AB (ref 0.44–1.00)
CREATININE: 14.65 mg/dL — AB (ref 0.44–1.00)
CREATININE: 14.76 mg/dL — AB (ref 0.44–1.00)
Calcium: 8 mg/dL — ABNORMAL LOW (ref 8.9–10.3)
Chloride: 87 mmol/L — ABNORMAL LOW (ref 101–111)
Creatinine, Ser: 14.16 mg/dL — ABNORMAL HIGH (ref 0.44–1.00)
Creatinine, Ser: 9.34 mg/dL — ABNORMAL HIGH (ref 0.44–1.00)
GFR calc Af Amer: 3 mL/min — ABNORMAL LOW (ref >60)
GFR calc Af Amer: 3 mL/min — ABNORMAL LOW (ref >60)
GFR calc Af Amer: 3 mL/min — ABNORMAL LOW (ref >60)
GFR calc Af Amer: 3 mL/min — ABNORMAL LOW (ref >60)
GFR calc Af Amer: 5 mL/min — ABNORMAL LOW (ref >60)
GFR calc non Af Amer: 3 mL/min — ABNORMAL LOW (ref >60)
GFR calc non Af Amer: 3 mL/min — ABNORMAL LOW (ref >60)
GFR calc non Af Amer: 3 mL/min — ABNORMAL LOW (ref >60)
GFR calc non Af Amer: 5 mL/min — ABNORMAL LOW (ref >60)
GFR, EST NON AFRICAN AMERICAN: 3 mL/min — AB (ref >60)
GLUCOSE: 47 mg/dL — AB (ref 65–99)
GLUCOSE: 83 mg/dL (ref 65–99)
GLUCOSE: 162 mg/dL — AB (ref 65–99)
GLUCOSE: 180 mg/dL — AB (ref 65–99)
GLUCOSE: 134 mg/dL — AB (ref 65–99)
Potassium: 3.5 mmol/L (ref 3.5–5.1)
Potassium: 3.9 mmol/L (ref 3.5–5.1)
Potassium: 4.5 mmol/L (ref 3.5–5.1)
Potassium: 4.4 mmol/L (ref 3.5–5.1)
Potassium: 3.8 mmol/L (ref 3.5–5.1)
Sodium: 127 mmol/L — ABNORMAL LOW (ref 135–145)
Sodium: 126 mmol/L — ABNORMAL LOW (ref 135–145)
Sodium: 123 mmol/L — ABNORMAL LOW (ref 135–145)
Sodium: 122 mmol/L — ABNORMAL LOW (ref 135–145)
Sodium: 128 mmol/L — ABNORMAL LOW (ref 135–145)

## 2016-05-15 LAB — TROPONIN I
TROPONIN I: 0.91 ng/mL — AB (ref <0.03)
TROPONIN I: 1.18 ng/mL — AB (ref <0.03)
TROPONIN I: 1.28 ng/mL — AB (ref <0.03)
Troponin I: 1.16 ng/mL (ref <0.03)

## 2016-05-15 LAB — MAGNESIUM: Magnesium: 1.6 mg/dL — ABNORMAL LOW (ref 1.7–2.4)

## 2016-05-15 LAB — GLUCOSE, CAPILLARY
GLUCOSE-CAPILLARY: 542 mg/dL — AB (ref 65–99)
GLUCOSE-CAPILLARY: 75 mg/dL (ref 65–99)
GLUCOSE-CAPILLARY: 108 mg/dL — AB (ref 65–99)
GLUCOSE-CAPILLARY: 104 mg/dL — AB (ref 65–99)
GLUCOSE-CAPILLARY: 110 mg/dL — AB (ref 65–99)
GLUCOSE-CAPILLARY: 161 mg/dL — AB (ref 65–99)
GLUCOSE-CAPILLARY: 155 mg/dL — AB (ref 65–99)
GLUCOSE-CAPILLARY: 163 mg/dL — AB (ref 65–99)
GLUCOSE-CAPILLARY: 169 mg/dL — AB (ref 65–99)
GLUCOSE-CAPILLARY: 188 mg/dL — AB (ref 65–99)
GLUCOSE-CAPILLARY: 182 mg/dL — AB (ref 65–99)
GLUCOSE-CAPILLARY: 177 mg/dL — AB (ref 65–99)
Glucose-Capillary: 49 mg/dL — ABNORMAL LOW (ref 65–99)
Glucose-Capillary: 106 mg/dL — ABNORMAL HIGH (ref 65–99)
Glucose-Capillary: 67 mg/dL (ref 65–99)
Glucose-Capillary: 147 mg/dL — ABNORMAL HIGH (ref 65–99)
Glucose-Capillary: 163 mg/dL — ABNORMAL HIGH (ref 65–99)
Glucose-Capillary: 130 mg/dL — ABNORMAL HIGH (ref 65–99)
Glucose-Capillary: 133 mg/dL — ABNORMAL HIGH (ref 65–99)

## 2016-05-15 LAB — CREATININE, SERUM
Creatinine, Ser: 14.8 mg/dL — ABNORMAL HIGH (ref 0.44–1.00)
GFR calc Af Amer: 3 mL/min — ABNORMAL LOW (ref >60)
GFR calc non Af Amer: 3 mL/min — ABNORMAL LOW (ref >60)

## 2016-05-15 LAB — HEMOGLOBIN A1C
HEMOGLOBIN A1C: 15 % — AB (ref 4.8–5.6)
Mean Plasma Glucose: 384 mg/dL

## 2016-05-15 LAB — MRSA PCR SCREENING: MRSA by PCR: NEGATIVE

## 2016-05-15 MED ORDER — SODIUM CHLORIDE 0.9 % IV SOLN: 100.0000 mL | INTRAVENOUS | Status: DC | PRN

## 2016-05-15 MED ORDER — NALOXONE HCL 0.4 MG/ML IJ SOLN
0.4000 mg | INTRAMUSCULAR | Status: DC | PRN
  Administered 2016-05-15 (×2): 0.4 mg via INTRAVENOUS

## 2016-05-15 MED ORDER — DEXTROSE 10 % IV SOLN
INTRAVENOUS | Status: DC
  Administered 2016-05-15 – 2016-05-16 (×2): via INTRAVENOUS

## 2016-05-15 MED ORDER — BENZONATATE 100 MG PO CAPS
200.0000 mg | ORAL_CAPSULE | Freq: Three times a day (TID) | ORAL | Status: DC | PRN
  Administered 2016-05-15 – 2016-05-20 (×5): 200 mg via ORAL
  Filled 2016-05-15 (×5): qty 2

## 2016-05-15 MED ORDER — FENTANYL CITRATE (PF) 100 MCG/2ML IJ SOLN
100.0000 ug | Freq: Once | INTRAMUSCULAR | Status: AC
  Administered 2016-05-15: 100 ug via INTRAVENOUS

## 2016-05-15 MED ORDER — DEXTROSE 50 % IV SOLN
20.0000 mL | Freq: Once | INTRAVENOUS | Status: AC
  Administered 2016-05-15: 20 mL via INTRAVENOUS

## 2016-05-15 MED ORDER — MIDAZOLAM HCL 2 MG/2ML IJ SOLN: 5.0000 mg | Freq: Once | INTRAMUSCULAR | Status: AC

## 2016-05-15 MED ORDER — FONDAPARINUX SODIUM 2.5 MG/0.5ML ~~LOC~~ SOLN
2.5000 mg | Freq: Every day | SUBCUTANEOUS | Status: DC
  Filled 2016-05-15: qty 0.5

## 2016-05-15 MED ORDER — ENOXAPARIN SODIUM 30 MG/0.3ML ~~LOC~~ SOLN: 30.0000 mg | SUBCUTANEOUS | Status: DC

## 2016-05-15 MED ORDER — NALOXONE HCL 0.4 MG/ML IJ SOLN
INTRAMUSCULAR | Status: AC
  Filled 2016-05-15: qty 1

## 2016-05-15 MED ORDER — ALTEPLASE 2 MG IJ SOLR: 2.0000 mg | Freq: Once | INTRAMUSCULAR | Status: DC | PRN

## 2016-05-15 MED ORDER — DEXTROSE 50 % IV SOLN
13.0000 mL | Freq: Once | INTRAVENOUS | Status: AC
  Administered 2016-05-15: 13 mL via INTRAVENOUS

## 2016-05-15 MED ORDER — MIDAZOLAM HCL 2 MG/2ML IJ SOLN
INTRAMUSCULAR | Status: AC
  Administered 2016-05-15: 5 mg
  Filled 2016-05-15: qty 6

## 2016-05-15 MED ORDER — BENZONATATE 100 MG PO CAPS
100.0000 mg | ORAL_CAPSULE | Freq: Two times a day (BID) | ORAL | Status: DC
  Administered 2016-05-16 – 2016-05-21 (×11): 100 mg via ORAL
  Filled 2016-05-15 (×12): qty 1

## 2016-05-15 MED ORDER — PENTAFLUOROPROP-TETRAFLUOROETH EX AERO: 1.0000 "application " | INHALATION_SPRAY | CUTANEOUS | Status: DC | PRN

## 2016-05-15 MED ORDER — DEXTROSE 50 % IV SOLN
INTRAVENOUS | Status: AC
  Administered 2016-05-15: 20 mL
  Filled 2016-05-15: qty 50

## 2016-05-15 MED ORDER — LIDOCAINE-PRILOCAINE 2.5-2.5 % EX CREA: 1.0000 "application " | TOPICAL_CREAM | CUTANEOUS | Status: DC | PRN

## 2016-05-15 MED ORDER — LORAZEPAM 2 MG/ML IJ SOLN: 2.0000 mg | Freq: Once | INTRAMUSCULAR | Status: DC

## 2016-05-15 MED ORDER — HYDRALAZINE HCL 20 MG/ML IJ SOLN
10.0000 mg | INTRAMUSCULAR | Status: DC | PRN
  Administered 2016-05-15: 10 mg via INTRAVENOUS
  Filled 2016-05-15 (×2): qty 1

## 2016-05-15 MED ORDER — HEPARIN SODIUM (PORCINE) 1000 UNIT/ML DIALYSIS: 1000.0000 [IU] | INTRAMUSCULAR | Status: DC | PRN

## 2016-05-15 MED ORDER — LIDOCAINE HCL (PF) 1 % IJ SOLN: 5.0000 mL | INTRAMUSCULAR | Status: DC | PRN

## 2016-05-15 MED ORDER — FENTANYL CITRATE (PF) 100 MCG/2ML IJ SOLN
INTRAMUSCULAR | Status: AC
  Filled 2016-05-15: qty 2

## 2016-05-15 NOTE — CV Procedure (Signed)
2D echo attempted but patient non- compliant and has altered mental status. Try at later time.

## 2016-05-15 NOTE — Progress Notes (Signed)
PROGRESS NOTE    Priscilla [name]  DDU:[id number] DOB: 21-Jan-1971 DOA: 05/14/2016 PCP: [name], MD    Brief Narrative:   46 y/o ? 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   ESRD (end stage renal disease) ([facility])   Diabetes mellitus type I ([place])   S/P CABG x 2   DKA (diabetic ketoacidoses) ([facility])   Microcytic anemia   Hyponatremia   Transaminitis   Toxic metabolic encephalopathy-secondary to DKA, uremia Seems to be stable at present time she is able to move all 4 limbs but does not seem to want to cooperate with exam and is pretty sleepy  Continue gabapentin when available and alert enough Would hold OxyIR 5-10 every 6 when necessary number Remeron 15 at bedtime, gabapentin 100 daily, Phenergan 12.5 every 6 when necessary If her mentation does not improve after withdrawal of above medications I would look for source of infection and check dialysate  Keep on monitoring on step down overnight  DKA/hyponatremia from osmotic diuresis-anion gap is still 22--dialysis should help with renal component of this CBGs are in range of 200 but would not transition off of insulin GTT until the Has closed Will probably need D10 infusion until next lab draws and may need to switch to saline containing solution as well Patient was given bicarbonate on admission Recheck repeat labs  ESRD on CPPD-will need peritoneal dialysis--has not received in the past couple  of days. Nephrology is aware Metabolic bone disease as per nephrology  Anemia of renal disease Await iron studies--might need to replacement, might need IV iron or erythropoietin  Prior CABG + mitral valve repair 07/18/15 Rising troponin--no overt chest pain but poor exam Repeat EKG Repeat troponin If sharp increase in troponin, would consult cardiology  Hypertension-poorly controlled Be related to lack of dialysis.  Acute decompensated diastolic heart failure-last echo EF 55 Echocardiogram repeat is pending   DVT prophylaxis: Lovenox Code Status: Full Family Communication: none + Disposition Plan: sdu today--hopeful for some over the course of the next 24 hours   Consultants:   Nephrology  Procedures:   None yet  Antimicrobials:   None    Subjective: Awakens but poorly responsive Mainly mumbles and nods Moving all 4 limbs States she has a headache  Objective: Vitals:   05/14/16 1800 05/14/16 1858 05/14/16 2300 05/15/16 0300  BP: 151/85 165/92 (!) 161/92   Pulse: 65 63 64 64  Resp: (!) 27  20 18   Temp:  97.8 F (36.6 C) 98.1 F (36.7 C) 97.4 F (36.3 C)  TempSrc:  Oral Oral Oral  SpO2: 100% 99% 99% 100%  Weight:      Height:        Intake/Output Summary (Last 24 hours) at 05/15/16 0821 Last data filed at 05/15/16 0300  Gross per 24 hour  Intake          1674.72 ml  Output                0 ml  Net  1674.72 ml   Filed Weights   05/14/16 1250  Weight: 53.1 kg (117 lb)    Examination:  General exam: Sleepy and not very cooperative with exam Respiratory system: Clear to auscultation. Respiratory effort normal. Cardiovascular system: S1 & S2 heard, RRR. No JVD Gastrointestinal system: Abdomen is nondistended--states she has some pain in her belly when pressed however no overt rebound--patient gives a very poor exam and does not coperate with exam No organomegaly or masses felt.  Abdominal dialysis peritoneal catheter in place Central  nervous system: Alert and oriented. No focal neurological deficits. Extremities: Symmetric 5 x 5 power. Reactive to menace Skin: No rashes, lesions or ulcers Psychiatry: Judgement and insight are poor and patient is sleepy.     Data Reviewed: I have personally reviewed following labs and imaging studies  CBC:  Recent Labs Lab 05/14/16 1254 05/14/16 1542 05/14/16 1805  WBC 9.7  --   --   NEUTROABS 8.2*  --   --   HGB 10.6* 12.2 11.2*  HCT 31.8* 36.0 33.0*  MCV 77.8*  --   --   PLT 170  --   --    Basic Metabolic Panel:  Recent Labs Lab 05/14/16 1254  05/14/16 1805 05/14/16 1907 05/14/16 2138 05/15/16 0210 05/15/16 0515  NA 118*  < > 121* 122* 126* 127* 126*  K 5.1  < > 4.8 4.6 3.8 3.5 3.9  CL 81*  < > 85* 87* 87* 87* 86*  CO2 15*  --   --  17* 17* 18* 18*  GLUCOSE 878*  < > 668* 567* 279* 47* 83  BUN 61*  < > 55* 62* 64* 63* 63*  CREATININE 14.72*  < > 15.20* 14.53* 14.77* 14.16* 14.51*  CALCIUM 8.2*  --   --  7.9* 8.0* 8.5* 8.2*  MG  --   --   --   --   --   --  1.6*  < > = values in this interval not displayed. GFR: Estimated Creatinine Clearance: 3.6 mL/min (by C-G formula based on SCr of 14.51 mg/dL (H)). Liver Function Tests:  Recent Labs Lab 05/14/16 1254  AST 110*  ALT 109*  ALKPHOS 97  BILITOT 0.8  PROT 6.7  ALBUMIN 2.9*   No results for input(s): LIPASE, AMYLASE in the last 168 hours. No results for input(s): AMMONIA in the last 168 hours. Coagulation Profile:  Recent Labs Lab 05/14/16 1519  INR 1.42   Cardiac Enzymes:  Recent Labs Lab 05/14/16 1907 05/14/16 2251 05/15/16 0515  TROPONINI 0.53* 0.69* 0.91*   BNP (last 3 results) No results for input(s): PROBNP in the last 8760 hours. HbA1C:  Recent Labs  05/14/16 1907  HGBA1C 15.0*   CBG:  Recent Labs Lab 05/15/16 0235 05/15/16 0347 05/15/16 0410 05/15/16 0524 05/15/16 0630  GLUCAP 106* 67 108* 104* 110*   Lipid Profile: No results for input(s): CHOL, HDL, LDLCALC,  TRIG, CHOLHDL, LDLDIRECT in the last 72 hours. Thyroid Function Tests: No results for input(s): TSH, T4TOTAL, FREET4, T3FREE, THYROIDAB in the last 72 hours. Anemia Panel: No results for input(s): VITAMINB12, FOLATE, FERRITIN, TIBC, IRON, RETICCTPCT in the last 72 hours. Sepsis Labs:  Recent Labs Lab 05/14/16 1314 05/14/16 1529 05/14/16 1805  LATICACIDVEN 4.56* 4.05*  4.05* 3.51*    Recent Results (from the past 240 hour(s))  MRSA PCR Screening     Status: None   Collection Time: 05/14/16  9:24 PM  Result Value Ref Range Status   MRSA by PCR  NEGATIVE NEGATIVE Final    Comment:        The GeneXpert MRSA Assay (FDA approved for NASAL specimens only), is one component of a comprehensive MRSA colonization surveillance program. It is not intended to diagnose MRSA infection nor to guide or monitor treatment for MRSA infections.          Radiology Studies: Dg Chest 2 View  Result Date: 05/14/2016 CLINICAL DATA:  Cough. EXAM: CHEST  2 VIEW COMPARISON:  08/30/2014. FINDINGS: Mediastinum and hilar structures normal. Prior cardiac valve replacement and CABG. Cardiomegaly. Mild pulmonary vascular prominence and bilateral interstitial prominence with bilateral pleural effusions. Findings consistent with congestive heart failure. IMPRESSION: 1. Prior CABG.  Prior cardiac valve replacement. 2. Congestive heart failure with bilateral pulmonary interstitial edema and bilateral pleural effusions. Electronically Signed   By: Marcello Moores  Register   On: 05/14/2016 13:53   Portable Chest X-ray (1 View)  Result Date: 05/15/2016 CLINICAL DATA:  Congestive heart failure. History of renal and pancreas transplant. Underlying diabetes and hypertension. EXAM: PORTABLE CHEST 1 VIEW COMPARISON:  05/14/2016 and 08/30/2014. FINDINGS: 0441 hour. Stable mild cardiac enlargement post CABG and mitral valve replacement. There is stable vascular congestion without overt pulmonary edema. Left pleural effusion and  left basilar atelectasis/scarring appear unchanged from 2016. There is no pneumothorax. IMPRESSION: Stable cardiomegaly and chronic vascular congestion. Remaining left basilar pleuroparenchymal opacities appear chronic. Electronically Signed   By: Richardean Sale M.D.   On: 05/15/2016 07:47        Scheduled Meds: . aspirin  325 mg Oral Daily  . calcitRIOL  0.25 mcg Oral BID  . calcium acetate  2,001 mg Oral TID WC  . carvedilol  6.25 mg Oral BID WC  . gabapentin  100 mg Oral Daily  . hydrALAZINE  10 mg Oral TID  . mirtazapine  15 mg Oral QHS  . pantoprazole  40 mg Oral Daily  . sodium bicarbonate  100 mEq Intravenous Once   Continuous Infusions: . sodium chloride Stopped (05/14/16 2310)  . sodium chloride    . dextrose 50 mL/hr at 05/15/16 0243  . insulin (NOVOLIN-R) infusion Stopped (05/15/16 0348)     LOS: 1 day    Time spent: [place], [place], MD [facility] Pager [phone]  If 7PM-7AM, please contact night-coverage [email or web address] Password [email or web address] 05/15/2016, 8:21 AM

## 2016-05-15 NOTE — Progress Notes (Signed)
Inpatient Diabetes Program Recommendations  AACE/ADA: New Consensus Statement on Inpatient Glycemic Control (2015)  Target Ranges:  Prepandial:   less than 140 mg/dL      Peak postprandial:   less than 180 mg/dL (1-2 hours)      Critically ill patients:  140 - 180 mg/dL   Lab Results  Component Value Date   GLUCAP 163 (H) 05/15/2016   HGBA1C 15.0 (H) 05/14/2016    Review of Glycemic Control  Diabetes history: DM1 Outpatient Diabetes medications: Lantus 10 +Lispro 10-15 units tid meal coverage Current orders for Inpatient glycemic control: IV insulin  Inpatient Diabetes Program Recommendations:  Noted patient currently in echo lab and will plan to discuss insulin regimen with her this pm or early am. Noted current A1c is 15.0. Patient currently on IV insulin drip and will need Lantus 10 units 2 hrs prior to IV insulin discontinued.  Patient last seen by DM Coordinator 07/22/14 post CABG surgery. This is the note @ that time "Spoke with patient about diabetes and home regimen for diabetes control. Patient reports that she has Type 1 DM and is followed by Dr. Chalmers Cater for diabetes management.  Currently she takes Lantus 10 units QHS and Humalog 0-15 units TID with meals (meal coverage (1 unit for every 15 grams) plus sliding scale) as an outpatient for diabetes control. Patient reports that she "has been doing what I am suppose to do for my diabetes for about 1 month". Patient reports that prior to about 1 month ago she was not checking glucose as requested and was not taking her insulin consistently like she should. Patient reports that she checks her glucose 4 times per day and over the past 3-4 weeks her glucose has improved significantly and is mostly in the 150-170 mg/dl range. Inquired about knowledge about A1C and patient reports that she knows what an A1C is and reports that her last A1C was in the 10% range. Discussed A1C results (10.0% on 07/14/14) and discussed A1C reflects glucose value  average over the past 2-3 months and reviewed how A1C values correlate to actual glucose values. Stressed importance of checking CBGs and taking insulin as prescribed. Discussed importance of maintaining good CBG control to prevent long-term and short-term complications especially given recent surgery. Discussed impact of nutrition, exercise, stress, sickness, and medications on diabetes control.  Patient states that she has made positive changes over the past month and she intends to continue to work toward getting diabetes controlled.  Encouraged patient to continue to check CBGs at least 4 times per day and work with Dr. Chalmers Cater to make adjustments to her insulin regimen as needed based on glucose trends.  Patient verbalized understanding of information discussed and she states that she has no further questions at this time related to diabetes."   Thank you, Nani Gasser. Hildagard Sobecki, RN, MSN, CDE Inpatient Glycemic Control Team Team Pager [phone] (8am-5pm) 05/15/2016 10:11 AM

## 2016-05-15 NOTE — Progress Notes (Signed)
Shift summary:  Upon initial assessment, patient lethargic, unable to follow commands. Oriented x1, but did not respond to other orientation questions or simply answered "yes" to open ended questions. At times patient uncooperative, trying to get up out of bed, even after repeated attempts to keep patient in bed. Patient's BP initially on assessment was 200s/100s. MD notified d/t inability to give patient PO meds d/t altered mental status and high aspiration risk. PRN IV hydralazine order given.  Orders were given to obtain a temporary HD cath. Consent obtained via telephone with patient's sister and placed on chart. Patient initially uncooperative and would not stay still long enough to place an HD line. Patient then sedated (see MAR for details) and temporary HD cath was placed in right groin. Throughout this procedure, patient was placed on Bipap and patient was able to maintain airway, but required 0.8mg  of narcan. Patient received HD this afternoon, but required restraints throughout the procedure. Patient's mentation improved throughout dialysis.  Initially patient's insulin gtt was off due to hypoglycemia over night last night. Insulin gtt restarted with D10 per MD and glucommander. When HD started insulin gtt turned off d/t drop in BG down to 130s-- per glucommander. See flowsheets for more information on VS and trends.

## 2016-05-15 NOTE — Progress Notes (Signed)
Called d/t pt received sedation for HD cath and MD requesting bipap.  This RT was in a sterile procedure, so co-worker Neomia Glass, RRT came to place pt on bipap per MD.    Pt appears to be tol well currently and becoming more alert.  RN at bedside.  VSS currently.  Sat 100%

## 2016-05-15 NOTE — Progress Notes (Signed)
Arrived to patient room 4N-09 at 1600.  Reviewed treatment plan and this RN agrees with plan.  Report received from bedside RN, Thurmond Butts.  Consent obtained per sister via telephone.  Patient Lethargic, Bipap.   Lung sounds diminished to ausculation in all fields. Generalized non pitting edema. Cardiac:  NSR.  Removed caps and cleansed R femoral catheter with chlorhedxidine.  Aspirated ports of heparin and flushed them with saline per protocol.  Connected and secured lines, initiated treatment at 1611.  UF Goal of 2500 mL and net fluid removal 2 L.  Will continue to monitor.

## 2016-05-15 NOTE — Procedures (Signed)
Pre-operative diagnosis: acute delirium, renal failure Post-operative diagnosis: acute delirium, renal failure  Patient required hemodialysis catheter placement.  She was very agitated and unable to cooperate with procedure.  She required 5 mg versed, 100 mcg fentanyl to perform the procedure.  She developed hypoxia during procedure.  She was started on bag mask and then transitioned to Bipap.  Will give narcan.  Time spent 32 minutes for conscious sedation.  [name], MD [facility] 05/15/2016, 2:43 PM Pager:  [phone] After 3pm call: [phone]

## 2016-05-15 NOTE — Progress Notes (Signed)
Dialysis treatment completed.  1600 mL ultrafiltrated.  1100 mL net fluid removal.  Patient status improved, A & O X 3, off to situation. Lung sounds diminished to ausculation in all fields. No edema. Cardiac: NSR.  Cleansed R femoral catheter with chlorhexidine.  Disconnected lines and flushed ports with saline per protocol.  Ports locked with heparin and capped per protocol.    Report given to bedside, RN Ryan.  Treatment terminated early per Dr. Joelyn Oms due to concerns with correcting sodium (122 at start) too quickly.

## 2016-05-15 NOTE — Procedures (Signed)
Hemodialysis Catheter Insertion Procedure Note Priscilla [name] [id number] December 06, 1970  Procedure: Insertion of Hemodialysis Catheter Indications: Hemodialysis  Procedure Details Consent: Risks of procedure as well as the alternatives and risks of each were explained to the (patient/caregiver).  Consent for procedure obtained.  Time Out: Verified patient identification, verified procedure, site/side was marked, verified correct patient position, special equipment/implants available, medications/allergies/relevent history reviewed, required imaging and test results available.  Performed  Maximum sterile technique was used including antiseptics, cap, gloves, gown, hand hygiene, mask and sheet.  Skin prep: Chlorhexidine; local anesthetic administered  A Trialysis HD catheter was placed in the right femoral vein due to patient being a dialysis patient using the Seldinger technique.  Evaluation Blood flow good Complications: No apparent complications Patient tolerated procedure well.   Procedure performed under direct supervision of Dr. Halford Chessman and with ultrasound guidance for real time vessel cannulation.     Noe Gens, NP-C [facility] Pgr: [phone] or [phone] 05/15/2016, 2:39 PM

## 2016-05-15 NOTE — Progress Notes (Signed)
Per RN, pt is starting to wake up now & hold off on ABG currently, MD took pt off bipap.

## 2016-05-15 NOTE — Consult Note (Signed)
CARDIOLOGY CONSULT NOTE   Patient ID: Priscilla [name] MRN: [id number], DOB/AGE: 08-25-1970   Admit date: 05/14/2016 Date of Consult: 05/15/2016   Primary Physician: [name], MD Primary Cardiologist: Dr. Sallyanne Kuster  Consulted by Dr. Verlon Au Reason for consult: elevated trop, EKG changes  Pt. 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 Trop > 1. Cardiology consulted for elevated trop.   Problem List  Past Medical History:  Diagnosis Date  . Anemia   . Cardiomyopathy, ischemic 06/24/2014  . Diabetes mellitus without complication ([place])   . End stage renal disease ([place])   . GERD (gastroesophageal reflux disease)   . H/O kidney transplant   . Hyperlipidemia   . Hypertension   . Pancreas transplanted ([place])   . Renal disorder   . 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 S/P mitral valve repair 07/18/14    Past Surgical History:  Procedure Laterality Date  . CAPD INSERTION N/A 09/08/2013   Procedure: Laparoscopic CAPD peritoneal dialysis catheter placement, possible lysis of adhesions   ;  Surgeon: Adin Hector, MD;  Location: [place];  Service: General;  Laterality: N/A;  . CARDIAC CATHETERIZATION     06/2014  . CESAREAN SECTION    . COMBINED KIDNEY-PANCREAS TRANSPLANT  05/2010   [facility]..  Pancreas left mid abdomen  . CORONARY ARTERY BYPASS GRAFT N/A 07/18/2014   Procedure: CORONARY ARTERY BYPASS GRAFTING (CABG)X2 LIMA-LAD; SVG-PD;  Surgeon: Melrose Nakayama, MD;  Location: [place];  Service: Open Heart Surgery;  Laterality: N/A;  . INSERTION OF DIALYSIS CATHETER  September 28, 2012   Right upper chest  . LAPAROSCOPIC INSERTION PERITONEAL CATHETER  07/18/2008   Dr Excell Seltzer  . LAPAROSCOPIC REPOSITIONING CAPD CATHETER   [id number]   Dr Excell Seltzer  . LAPAROSCOPIC REPOSITIONING CAPD CATHETER  11/08/2008   Dr Excell Seltzer  . LAPAROSCOPIC REPOSITIONING CAPD CATHETER  10/03/2009   Unplugging CAPD catheter Dr Excell Seltzer  . LEFT HEART CATHETERIZATION WITH CORONARY ANGIOGRAM N/A 06/28/2014   Procedure: LEFT HEART CATHETERIZATION WITH CORONARY ANGIOGRAM;  Surgeon: [name], MD;  Location: [facility];  Service: Cardiovascular;  Laterality: N/A;  . MITRAL VALVE REPAIR N/A 07/18/2014   Procedure: MITRAL VALVE REPAIR (MVR);  Surgeon: Melrose Nakayama, MD;  Location: [facility];  Service: Open Heart Surgery;  Laterality: N/A;  . OMENTECTOMY  11/08/2008   Dr Excell Seltzer  . TEE WITHOUT CARDIOVERSION N/A 07/18/2014   Procedure: TRANSESOPHAGEAL ECHOCARDIOGRAM (TEE);  Surgeon: Melrose Nakayama, MD;  Location: [place];  Service: Open Heart Surgery;  Laterality: N/A;     Allergies  Allergies  Allergen Reactions  . 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 Her last cardiac catheterization performed on 06/28/2014 by Dr. Sallyanne Kuster revealed 95% proximal LAD stenosis, 50-60% mid LAD lesion, 70-80% distal LAD lesion, there was a 90% stenosis in mid left circumflex after the first OM, she had a small sized dominant RCA that was diffusely diseased, calcified and a  subtotally occluded before a degenerated posterior lateral ventricular system. Ejection fraction was 30-35%. She eventually underwent 2 vessel CABG by Dr. Roxan Hockey on 07/18/2014 with LIMA to LAD, SVG to posterior descending. She also had mitral valve repair with a 28 mm Sorin Memo 3D annuloplasty ring. Although prior to her bypass surgery and mitral valve repair, the echocardiogram showed a moderate severe TR, after her bypass and a  mitral valve repair, she had a repeat echocardiogram in June 2016 that showed her EF has improved to 55-60%, mild MR/TR, well-positioned annuloplasty repair, mild TR.   Her last follow-up with Dr. Sallyanne Kuster was in September 2016 at which time she was doing well. She has failed to follow-up since then. She presented to the hospital on 05/14/2016 with altered mental status. On initial arrival, her sodium was 118, her glucose was over 100. Hemoglobin 10.6, lactic acid 4.56. Chest x-ray showed mild congestive heart failure with bilateral pulmonary interstitial edema. Her creatinine was 15.7. Potassium 5.2. Nephrology was consulted and recommended initiate hemodialysis. She remained very much confused and could not give reasonable story. She keep trying to get off the arm restraints which keeps her from pulling the dialysis cable. Troponin was initially elevated at 0.7, later trended up to 1.28. EKG showed downsloping ST segment in the lateral leads. On further questioning, patient does not appears to have any chest discomfort or shortness breath. Furthermore, she has been coughing for the past 2 weeks. Today's EKG showed stable cardiomegaly, chronic vascular congestion, chronic appearing left basilar pleuroparenchymal opacities that's unchanged compared to 2016.    Inpatient Medications  . aspirin  325 mg Oral Daily  . benzonatate  100 mg Oral BID  . calcitRIOL  0.25 mcg Oral BID  . calcium acetate  2,001 mg Oral TID WC  . carvedilol  6.25 mg Oral BID WC  . hydrALAZINE  10 mg Oral TID  . pantoprazole  40 mg Oral Daily  . sodium bicarbonate  100 mEq Intravenous Once    Family History Family History  Problem Relation Age of Onset  . CAD Mother   . Stroke Mother   . COPD Father   . Colon cancer Neg Hx      Social History Social History   Social History  . Marital status: Divorced    Spouse name: N/A  . Number of children: 1  . Years of education: N/A   Occupational History  . Manager for  social services    Social History Main Topics  . Smoking status: Never Smoker  . Smokeless tobacco: Never Used  . Alcohol use No  . Drug use: No  . Sexual activity: Not Currently    Birth control/ protection: None   Other Topics Concern  . Not on file   Social History Narrative  . No narrative on file     Review of Systems  Denies any chest pain or shortness of breath, otherwise patient is unable to give reasonable history. She keeps moving around and try to get off the arm restraints.  Physical Exam  Blood pressure (!) 177/93, pulse 87, temperature 99.3 F (37.4 C), temperature source Oral, resp. rate (!) 31, height 5' (1.524 m), weight 128 lb 4.9 oz (58.2 kg), SpO2 100 %.  General: altered mental status Psych: altered mental status Neuro: Alert. Moves all extremities spontaneously. HEENT: Normal  Neck: Supple without bruits or JVD. Lungs:  Resp regular and unlabored, anterior exam CTA. Heart: RRR no s3, s4, or murmurs. Abdomen: Soft, non-tender, non-distended,  BS + x 4.  Extremities: No clubbing, cyanosis or edema. DP/PT/Radials 2+ and equal bilaterally.  Labs   Recent Labs  05/14/16 2251 05/15/16 0515 05/15/16 0948 05/15/16 1449  TROPONINI 0.69* 0.91* 1.18* 1.28*   Lab Results  Component Value Date   WBC 15.3 (H) 05/15/2016   HGB 11.4 (L) 05/15/2016   HCT 32.4 (L) 05/15/2016   MCV 74.8 (L) 05/15/2016   PLT 173 05/15/2016    Recent Labs Lab 05/14/16 1254  05/15/16 1449  NA 118*  < > 122*  K 5.1  < > 4.4  CL 81*  < > 88*  CO2 15*  < > 18*  BUN 61*  < > 67*  CREATININE 14.72*  < > 14.76*  CALCIUM 8.2*  < > 7.8*  PROT 6.7  --   --   BILITOT 0.8  --   --   ALKPHOS 97  --   --   ALT 109*  --   --   AST 110*  --   --   GLUCOSE 878*  < > 180*  < > = values in this interval not displayed. Lab Results  Component Value Date   CHOL 196 01/16/2015   HDL 57 01/16/2015   LDLCALC 124 01/16/2015   TRIG 76 01/16/2015   No results found for:  DDIMER  Radiology/Studies  Dg Chest 2 View  Result Date: 05/14/2016 CLINICAL DATA:  Cough. EXAM: CHEST  2 VIEW COMPARISON:  08/30/2014. FINDINGS: Mediastinum and hilar structures normal. Prior cardiac valve replacement and CABG. Cardiomegaly. Mild pulmonary vascular prominence and bilateral interstitial prominence with bilateral pleural effusions. Findings consistent with congestive heart failure. IMPRESSION: 1. Prior CABG.  Prior cardiac valve replacement. 2. Congestive heart failure with bilateral pulmonary interstitial edema and bilateral pleural effusions. Electronically Signed   By: Marcello Moores  Register   On: 05/14/2016 13:53   Portable Chest X-ray (1 View)  Result Date: 05/15/2016 CLINICAL DATA:  Congestive heart failure. History of renal and pancreas transplant. Underlying diabetes and hypertension. EXAM: PORTABLE CHEST 1 VIEW COMPARISON:  05/14/2016 and 08/30/2014. FINDINGS: 0441 hour. Stable mild cardiac enlargement post CABG and mitral valve replacement. There is stable vascular congestion without overt pulmonary edema. Left pleural effusion and left basilar atelectasis/scarring appear unchanged from 2016. There is no pneumothorax. IMPRESSION: Stable cardiomegaly and chronic vascular congestion. Remaining left basilar pleuroparenchymal opacities appear chronic. Electronically Signed   By: Richardean Sale M.D.   On: 05/15/2016 07:47    ECG  Normal sinus rhythm with minimal ST depression in the lateral leads  ASSESSMENT AND PLAN  1. Acute encephalopathy likely secondary to DKA and renal encephalopathy  - Blood glucose improved, she is getting dialysis. Nephrology on board.  2. Elevated troponin: She denies any chest pain, although her mental status make her response quite questionable. This degree of troponin elevation and EKG changes could be seen with her metabolic issues. She is not a candidate for any workup at this time. Once her mental status improved, we may consider additional  workup depending on her echocardiogram. She is unable to get an echocardiogram at this time as she is uncooperative.  3. DKA with type 1 diabetes mellitus: Initial blood glucose greater than 800, will defer to primary service  4. ESRD on peritoneal dialysis: On arrival her creatinine was greater than 15, nephrology consult, she is starting hemodialysis. History of failed kidney transplant.  5. CAD s/p CABG x 2 (LIMA to LAD and SVG to PDA) in 06/2014: On initial cath  prior to CABG, her EF was 30-35%, this has improved to 55% on repeat echo in June 2016.  6. S/p mitral valve annuloplasty  7. Cough: Although his initial chest x-ray was concerning for mild interstitial edema, today's chest x-ray only shows chronic changes without any acute issues. She does not appears to be coughing up significant amount of phlegm. Will see if improve with HD.   Signed, Almyra Deforest, PA-C 05/15/2016, 4:56 PM  Agree with note by Almyra Deforest PA-C  We are asked to see patient with known CAD, mild elevation of troponin in the setting of DKA and subtle ST segment changes on her EKG. She has premature atherosclerosis status post bypass grafting 2 06/2014 with improvement in her EF at that time from 30-35% of the 55% by echo in June 2016. She performs peritoneal dialysis at home. She is admitted with hyponatremia, hyperglycemia and metabolic acidosis with elevated lactic acid level. She was encephalopathic. She is currently undergoing hemodialysis. She is hemodynamically stable. She denies chest pain. Her EKG shows right bundle-branch block, left anterior fascicular block with poor pronounced lateral ST depression. Troponins are mildly elevated to 1.28 on the background of chronic mild elevation. At this point, I'm not concerned that she has acute coronary syndrome. I think it can explain her mild troponin elevation as demand ischemia as well as her EKG changes. We will follow her closely, obtain a 2-D echocardiogram for LV function  and follow her troponin curve.  Lorretta Harp, M.D., [place], [facility], [place] [address] [place], [place]  [address]  [phone] 05/15/2016 5:55 PM

## 2016-05-16 ENCOUNTER — Inpatient Hospital Stay (HOSPITAL_COMMUNITY): Payer: Medicare Other

## 2016-05-16 DIAGNOSIS — I509 Heart failure, unspecified: Secondary | ICD-10-CM

## 2016-05-16 LAB — GLUCOSE, CAPILLARY
GLUCOSE-CAPILLARY: 126 mg/dL — AB (ref 65–99)
GLUCOSE-CAPILLARY: 140 mg/dL — AB (ref 65–99)
GLUCOSE-CAPILLARY: 151 mg/dL — AB (ref 65–99)
GLUCOSE-CAPILLARY: 185 mg/dL — AB (ref 65–99)
GLUCOSE-CAPILLARY: 196 mg/dL — AB (ref 65–99)
GLUCOSE-CAPILLARY: 200 mg/dL — AB (ref 65–99)
GLUCOSE-CAPILLARY: 292 mg/dL — AB (ref 65–99)
Glucose-Capillary: 148 mg/dL — ABNORMAL HIGH (ref 65–99)
Glucose-Capillary: 184 mg/dL — ABNORMAL HIGH (ref 65–99)
Glucose-Capillary: 189 mg/dL — ABNORMAL HIGH (ref 65–99)
Glucose-Capillary: 170 mg/dL — ABNORMAL HIGH (ref 65–99)
Glucose-Capillary: 121 mg/dL — ABNORMAL HIGH (ref 65–99)
Glucose-Capillary: 109 mg/dL — ABNORMAL HIGH (ref 65–99)

## 2016-05-16 LAB — ECHOCARDIOGRAM COMPLETE
HEIGHTINCHES: 60 in
Weight: 2017.65 oz

## 2016-05-16 MED ORDER — HEPARIN 1000 UNIT/ML FOR PERITONEAL DIALYSIS: 500.0000 [IU] | INTRAMUSCULAR | Status: DC | PRN

## 2016-05-16 MED ORDER — DELFLEX-LC/1.5% DEXTROSE 344 MOSM/L IP SOLN
Freq: Four times a day (QID) | INTRAPERITONEAL | Status: DC
  Administered 2016-05-16: via INTRAPERITONEAL

## 2016-05-16 MED ORDER — INSULIN ASPART 100 UNIT/ML ~~LOC~~ SOLN
3.0000 [IU] | Freq: Three times a day (TID) | SUBCUTANEOUS | Status: DC
  Administered 2016-05-17 – 2016-05-20 (×9): 3 [IU] via SUBCUTANEOUS

## 2016-05-16 MED ORDER — INSULIN ASPART 100 UNIT/ML ~~LOC~~ SOLN
0.0000 [IU] | Freq: Three times a day (TID) | SUBCUTANEOUS | Status: DC
  Administered 2016-05-16: 3 [IU] via SUBCUTANEOUS
  Administered 2016-05-17: 11 [IU] via SUBCUTANEOUS
  Administered 2016-05-17 – 2016-05-19 (×4): 15 [IU] via SUBCUTANEOUS
  Administered 2016-05-19: 3 [IU] via SUBCUTANEOUS
  Administered 2016-05-20: 5 [IU] via SUBCUTANEOUS
  Administered 2016-05-20: 11 [IU] via SUBCUTANEOUS
  Administered 2016-05-20: 5 [IU] via SUBCUTANEOUS
  Administered 2016-05-21: 3 [IU] via SUBCUTANEOUS

## 2016-05-16 MED ORDER — DEXTROSE-NACL 5-0.9 % IV SOLN
INTRAVENOUS | Status: DC
  Administered 2016-05-16: 10:00:00 via INTRAVENOUS

## 2016-05-16 MED ORDER — WHITE PETROLATUM GEL
Status: AC
  Administered 2016-05-16
  Filled 2016-05-16: qty 1

## 2016-05-16 MED ORDER — GENTAMICIN SULFATE 0.1 % EX CREA
TOPICAL_CREAM | Freq: Every day | CUTANEOUS | Status: DC
  Administered 2016-05-17 (×2): via TOPICAL
  Administered 2016-05-18: 1 via TOPICAL
  Administered 2016-05-20: 08:00:00 via TOPICAL
  Filled 2016-05-16: qty 15

## 2016-05-16 MED ORDER — GUAIFENESIN-DM 100-10 MG/5ML PO SYRP
5.0000 mL | ORAL_SOLUTION | ORAL | Status: DC | PRN
  Administered 2016-05-16: 5 mL via ORAL
  Filled 2016-05-16: qty 5

## 2016-05-16 MED ORDER — ZOLPIDEM TARTRATE 5 MG PO TABS
5.0000 mg | ORAL_TABLET | Freq: Once | ORAL | Status: AC
  Administered 2016-05-16: 5 mg via ORAL
  Filled 2016-05-16: qty 1

## 2016-05-16 MED ORDER — HYDROCODONE-HOMATROPINE 5-1.5 MG/5ML PO SYRP
2.5000 mL | ORAL_SOLUTION | ORAL | Status: DC | PRN
  Administered 2016-05-16 – 2016-05-19 (×8): 2.5 mL via ORAL
  Filled 2016-05-16 (×8): qty 5

## 2016-05-16 MED ORDER — CALCIUM ACETATE (PHOS BINDER) 667 MG PO CAPS
2001.0000 mg | ORAL_CAPSULE | Freq: Three times a day (TID) | ORAL | Status: DC
  Administered 2016-05-16 – 2016-05-21 (×18): 2001 mg via ORAL
  Filled 2016-05-16 (×18): qty 3

## 2016-05-16 NOTE — Progress Notes (Signed)
Admitted patient from [address],Alert and oriented x 4 .Placed in medical-surgical bed comfortably,bed alarm set on.No skin issues except from the right inguinal area where hemodialysis catheter was recently pulled out,with a bulky pressure dressing on its site,bleeding stained marked on.PD catheter on right mid-abdomen.

## 2016-05-16 NOTE — Progress Notes (Signed)
Subjective:  Pt lethargic. Difficult to interview  Objective:  Temp:  [97.6 F (36.4 C)-99.7 F (37.6 C)] 99 F (37.2 C) (01/18 0300) Pulse Rate:  [67-87] 70 (01/18 0300) Resp:  [14-31] 26 (01/18 0300) BP: (124-188)/(72-115) 143/86 (01/18 0300) SpO2:  [95 %-100 %] 95 % (01/18 0300) FiO2 (%):  [40 %] 40 % (01/17 1500) Weight:  [126 lb 1.7 oz (57.2 kg)-128 lb 4.9 oz (58.2 kg)] 126 lb 1.7 oz (57.2 kg) (01/17 1825) Weight change: 11 lb 4.9 oz (5.129 kg)  Intake/Output from previous day: 01/17 0701 - 01/18 0700 In: 581.3 [I.V.:581.3] Out: 1100   Intake/Output from this shift: No intake/output data recorded.  Physical Exam: General appearance: slowed mentation Neck: no adenopathy, no carotid bruit, no JVD, supple, symmetrical, trachea midline and thyroid not enlarged, symmetric, no tenderness/mass/nodules Lungs: clear to auscultation bilaterally Heart: regular rate and rhythm, S1, S2 normal, no murmur, click, rub or gallop Extremities: extremities normal, atraumatic, no cyanosis or edema  Lab Results: Results for orders placed or performed during the hospital encounter of 05/14/16 (from the past 48 hour(s))  Comprehensive metabolic panel     Status: Abnormal   Collection Time: 05/14/16 12:54 PM  Result Value Ref Range   Sodium 118 (LL) 135 - 145 mmol/L    Comment: CRITICAL RESULT CALLED TO, READ BACK BY AND VERIFIED WITH: A.WHEELER,RN 05/14/16 1431 BY BSLADE    Potassium 5.1 3.5 - 5.1 mmol/L   Chloride 81 (L) 101 - 111 mmol/L   CO2 15 (L) 22 - 32 mmol/L   Glucose, Bld 878 (HH) 65 - 99 mg/dL    Comment: CRITICAL RESULT CALLED TO, READ BACK BY AND VERIFIED WITH: A.WHEELER,RN 05/14/16 1431 BY BSLADE    BUN 61 (H) 6 - 20 mg/dL   Creatinine, Ser 14.72 (H) 0.44 - 1.00 mg/dL   Calcium 8.2 (L) 8.9 - 10.3 mg/dL   Total Protein 6.7 6.5 - 8.1 g/dL   Albumin 2.9 (L) 3.5 - 5.0 g/dL   AST 110 (H) 15 - 41 U/L   ALT 109 (H) 14 - 54 U/L   Alkaline Phosphatase 97 38 - 126 U/L   Total Bilirubin 0.8 0.3 - 1.2 mg/dL   GFR calc non Af Amer 3 (L) >60 mL/min   GFR calc Af Amer 3 (L) >60 mL/min    Comment: (NOTE) The eGFR has been calculated using the CKD EPI equation. This calculation has not been validated in all clinical situations. eGFR's persistently <60 mL/min signify possible Chronic Kidney Disease.    Anion gap 22 (H) 5 - 15  CBC with Differential     Status: Abnormal   Collection Time: 05/14/16 12:54 PM  Result Value Ref Range   WBC 9.7 4.0 - 10.5 K/uL   RBC 4.09 3.87 - 5.11 MIL/uL   Hemoglobin 10.6 (L) 12.0 - 15.0 g/dL   HCT 31.8 (L) 36.0 - 46.0 %   MCV 77.8 (L) 78.0 - 100.0 fL   MCH 25.9 (L) 26.0 - 34.0 pg   MCHC 33.3 30.0 - 36.0 g/dL   RDW 15.9 (H) 11.5 - 15.5 %   Platelets 170 150 - 400 K/uL   Neutrophils Relative % 84 %   Neutro Abs 8.2 (H) 1.7 - 7.7 K/uL   Lymphocytes Relative 10 %   Lymphs Abs 1.0 0.7 - 4.0 K/uL   Monocytes Relative 5 %   Monocytes Absolute 0.5 0.1 - 1.0 K/uL   Eosinophils Relative 0 %   Eosinophils  Absolute 0.0 0.0 - 0.7 K/uL   Basophils Relative 0 %   Basophils Absolute 0.0 0.0 - 0.1 K/uL  I-Stat CG4 Lactic Acid, ED     Status: Abnormal   Collection Time: 05/14/16  1:14 PM  Result Value Ref Range   Lactic Acid, Venous 4.56 (HH) 0.5 - 1.9 mmol/L   Comment NOTIFIED PHYSICIAN   Protime-INR     Status: Abnormal   Collection Time: 05/14/16  3:19 PM  Result Value Ref Range   Prothrombin Time 17.4 (H) 11.4 - 15.2 seconds   INR 1.42   I-Stat CG4 Lactic Acid, ED     Status: Abnormal   Collection Time: 05/14/16  3:29 PM  Result Value Ref Range   Lactic Acid, Venous 4.05 (HH) 0.5 - 1.9 mmol/L   Comment NOTIFIED PHYSICIAN   I-Stat CG4 Lactic Acid, ED     Status: Abnormal   Collection Time: 05/14/16  3:29 PM  Result Value Ref Range   Lactic Acid, Venous 4.05 (HH) 0.5 - 1.9 mmol/L   Comment NOTIFIED PHYSICIAN   I-Stat Venous Blood Gas, ED  ([facility], [facility])     Status: Abnormal   Collection Time: 05/14/16  3:37 PM  Result Value  Ref Range   pH, Ven 7.275 7.250 - 7.430   pCO2, Ven 43.4 (L) 44.0 - 60.0 mmHg   pO2, Ven 24.0 (LL) 32.0 - 45.0 mmHg   Bicarbonate 20.1 20.0 - 28.0 mmol/L   TCO2 21 0 - 100 mmol/L   O2 Saturation 36.0 %   Acid-base deficit 7.0 (H) 0.0 - 2.0 mmol/L   Patient temperature HIDE    Sample type VENOUS    Comment NOTIFIED PHYSICIAN   I-Stat venous blood gas, ED     Status: Abnormal   Collection Time: 05/14/16  3:37 PM  Result Value Ref Range   pH, Ven 7.275 7.250 - 7.430   pCO2, Ven 43.4 (L) 44.0 - 60.0 mmHg   pO2, Ven 24.0 (LL) 32.0 - 45.0 mmHg   Bicarbonate 20.1 20.0 - 28.0 mmol/L   TCO2 21 0 - 100 mmol/L   O2 Saturation 36.0 %   Acid-base deficit 7.0 (H) 0.0 - 2.0 mmol/L   Patient temperature HIDE    Sample type VENOUS    Comment NOTIFIED PHYSICIAN   I-stat chem 8, ed     Status: Abnormal   Collection Time: 05/14/16  3:42 PM  Result Value Ref Range   Sodium 118 (LL) 135 - 145 mmol/L   Potassium 5.2 (H) 3.5 - 5.1 mmol/L   Chloride 84 (L) 101 - 111 mmol/L   BUN 58 (H) 6 - 20 mg/dL   Creatinine, Ser 15.70 (H) 0.44 - 1.00 mg/dL   Glucose, Bld >700 (HH) 65 - 99 mg/dL   Calcium, Ion 0.93 (L) 1.15 - 1.40 mmol/L   TCO2 22 0 - 100 mmol/L   Hemoglobin 12.2 12.0 - 15.0 g/dL   HCT 36.0 36.0 - 46.0 %   Comment NOTIFIED PHYSICIAN   CBG monitoring, ED     Status: Abnormal   Collection Time: 05/14/16  4:04 PM  Result Value Ref Range   Glucose-Capillary >600 (HH) 65 - 99 mg/dL  POC CBG, ED     Status: Abnormal   Collection Time: 05/14/16  5:39 PM  Result Value Ref Range   Glucose-Capillary >600 (HH) 65 - 99 mg/dL  I-stat chem 8, ed     Status: Abnormal   Collection Time: 05/14/16  6:05 PM  Result Value Ref  Range   Sodium 121 (L) 135 - 145 mmol/L   Potassium 4.8 3.5 - 5.1 mmol/L   Chloride 85 (L) 101 - 111 mmol/L   BUN 55 (H) 6 - 20 mg/dL   Creatinine, Ser 15.20 (H) 0.44 - 1.00 mg/dL   Glucose, Bld 668 (HH) 65 - 99 mg/dL   Calcium, Ion 0.97 (L) 1.15 - 1.40 mmol/L   TCO2 20 0 - 100  mmol/L   Hemoglobin 11.2 (L) 12.0 - 15.0 g/dL   HCT 33.0 (L) 36.0 - 46.0 %   Comment NOTIFIED PHYSICIAN   I-Stat CG4 Lactic Acid, ED     Status: Abnormal   Collection Time: 05/14/16  6:05 PM  Result Value Ref Range   Lactic Acid, Venous 3.51 (HH) 0.5 - 1.9 mmol/L   Comment NOTIFIED PHYSICIAN   Glucose, capillary     Status: Abnormal   Collection Time: 05/14/16  6:51 PM  Result Value Ref Range   Glucose-Capillary 542 (HH) 65 - 99 mg/dL  Basic metabolic panel     Status: Abnormal   Collection Time: 05/14/16  7:07 PM  Result Value Ref Range   Sodium 122 (L) 135 - 145 mmol/L   Potassium 4.6 3.5 - 5.1 mmol/L   Chloride 87 (L) 101 - 111 mmol/L   CO2 17 (L) 22 - 32 mmol/L   Glucose, Bld 567 (HH) 65 - 99 mg/dL    Comment: CRITICAL RESULT CALLED TO, READ BACK BY AND VERIFIED WITH: Hillery Aldo 2115 05/14/2016 WBOND    BUN 62 (H) 6 - 20 mg/dL   Creatinine, Ser 14.53 (H) 0.44 - 1.00 mg/dL   Calcium 7.9 (L) 8.9 - 10.3 mg/dL   GFR calc non Af Amer 3 (L) >60 mL/min   GFR calc Af Amer 3 (L) >60 mL/min    Comment: (NOTE) The eGFR has been calculated using the CKD EPI equation. This calculation has not been validated in all clinical situations. eGFR's persistently <60 mL/min signify possible Chronic Kidney Disease.    Anion gap 18 (H) 5 - 15  Troponin I     Status: Abnormal   Collection Time: 05/14/16  7:07 PM  Result Value Ref Range   Troponin I 0.53 (HH) <0.03 ng/mL    Comment: CRITICAL RESULT CALLED TO, READ BACK BY AND VERIFIED WITH: Hillery Aldo 2115 05/14/2016 WBOND   Hemoglobin A1c     Status: Abnormal   Collection Time: 05/14/16  7:07 PM  Result Value Ref Range   Hgb A1c MFr Bld 15.0 (H) 4.8 - 5.6 %    Comment: (NOTE)         Pre-diabetes: 5.7 - 6.4         Diabetes: >6.4         Glycemic control for adults with diabetes: <7.0    Mean Plasma Glucose 384 mg/dL    Comment: (NOTE) Performed At: [facility] [address] [place], [place] [id number] Lindon Romp MD  OV:[id number]   Glucose, capillary     Status: Abnormal   Collection Time: 05/14/16  7:54 PM  Result Value Ref Range   Glucose-Capillary 525 (HH) 65 - 99 mg/dL  Glucose, capillary     Status: Abnormal   Collection Time: 05/14/16  8:55 PM  Result Value Ref Range   Glucose-Capillary 417 (H) 65 - 99 mg/dL  MRSA PCR Screening     Status: None   Collection Time: 05/14/16  9:24 PM  Result Value Ref Range   MRSA by PCR  NEGATIVE NEGATIVE    Comment:        The GeneXpert MRSA Assay (FDA approved for NASAL specimens only), is one component of a comprehensive MRSA colonization surveillance program. It is not intended to diagnose MRSA infection nor to guide or monitor treatment for MRSA infections.   Basic metabolic panel     Status: Abnormal   Collection Time: 05/14/16  9:38 PM  Result Value Ref Range   Sodium 126 (L) 135 - 145 mmol/L   Potassium 3.8 3.5 - 5.1 mmol/L   Chloride 87 (L) 101 - 111 mmol/L   CO2 17 (L) 22 - 32 mmol/L   Glucose, Bld 279 (H) 65 - 99 mg/dL   BUN 64 (H) 6 - 20 mg/dL   Creatinine, Ser 14.77 (H) 0.44 - 1.00 mg/dL   Calcium 8.0 (L) 8.9 - 10.3 mg/dL   GFR calc non Af Amer 3 (L) >60 mL/min   GFR calc Af Amer 3 (L) >60 mL/min    Comment: (NOTE) The eGFR has been calculated using the CKD EPI equation. This calculation has not been validated in all clinical situations. eGFR's persistently <60 mL/min signify possible Chronic Kidney Disease.    Anion gap 22 (H) 5 - 15  Glucose, capillary     Status: Abnormal   Collection Time: 05/14/16 10:05 PM  Result Value Ref Range   Glucose-Capillary 271 (H) 65 - 99 mg/dL  Troponin I (q 6hr x 3)     Status: Abnormal   Collection Time: 05/14/16 10:51 PM  Result Value Ref Range   Troponin I 0.69 (HH) <0.03 ng/mL    Comment: CRITICAL VALUE NOTED.  VALUE IS CONSISTENT WITH PREVIOUSLY REPORTED AND CALLED VALUE.  Glucose, capillary     Status: Abnormal   Collection Time: 05/14/16 11:02 PM  Result Value Ref Range    Glucose-Capillary 227 (H) 65 - 99 mg/dL  Glucose, capillary     Status: Abnormal   Collection Time: 05/14/16 11:51 PM  Result Value Ref Range   Glucose-Capillary 134 (H) 65 - 99 mg/dL  Glucose, capillary     Status: None   Collection Time: 05/15/16  1:10 AM  Result Value Ref Range   Glucose-Capillary 75 65 - 99 mg/dL  Glucose, capillary     Status: Abnormal   Collection Time: 05/15/16  2:09 AM  Result Value Ref Range   Glucose-Capillary 49 (L) 65 - 99 mg/dL  Basic metabolic panel     Status: Abnormal   Collection Time: 05/15/16  2:10 AM  Result Value Ref Range   Sodium 127 (L) 135 - 145 mmol/L   Potassium 3.5 3.5 - 5.1 mmol/L   Chloride 87 (L) 101 - 111 mmol/L   CO2 18 (L) 22 - 32 mmol/L   Glucose, Bld 47 (L) 65 - 99 mg/dL   BUN 63 (H) 6 - 20 mg/dL   Creatinine, Ser 14.16 (H) 0.44 - 1.00 mg/dL   Calcium 8.5 (L) 8.9 - 10.3 mg/dL   GFR calc non Af Amer 3 (L) >60 mL/min   GFR calc Af Amer 3 (L) >60 mL/min    Comment: (NOTE) The eGFR has been calculated using the CKD EPI equation. This calculation has not been validated in all clinical situations. eGFR's persistently <60 mL/min signify possible Chronic Kidney Disease.    Anion gap 22 (H) 5 - 15  Glucose, capillary     Status: Abnormal   Collection Time: 05/15/16  2:35 AM  Result Value Ref Range   Glucose-Capillary  106 (H) 65 - 99 mg/dL  Glucose, capillary     Status: None   Collection Time: 05/15/16  3:47 AM  Result Value Ref Range   Glucose-Capillary 67 65 - 99 mg/dL  Glucose, capillary     Status: Abnormal   Collection Time: 05/15/16  4:10 AM  Result Value Ref Range   Glucose-Capillary 108 (H) 65 - 99 mg/dL  Troponin I (q 6hr x 3)     Status: Abnormal   Collection Time: 05/15/16  5:15 AM  Result Value Ref Range   Troponin I 0.91 (HH) <0.03 ng/mL    Comment: CRITICAL VALUE NOTED.  VALUE IS CONSISTENT WITH PREVIOUSLY REPORTED AND CALLED VALUE.  Basic metabolic panel     Status: Abnormal   Collection Time: 05/15/16  5:15  AM  Result Value Ref Range   Sodium 126 (L) 135 - 145 mmol/L   Potassium 3.9 3.5 - 5.1 mmol/L   Chloride 86 (L) 101 - 111 mmol/L   CO2 18 (L) 22 - 32 mmol/L   Glucose, Bld 83 65 - 99 mg/dL   BUN 63 (H) 6 - 20 mg/dL   Creatinine, Ser 14.51 (H) 0.44 - 1.00 mg/dL   Calcium 8.2 (L) 8.9 - 10.3 mg/dL   GFR calc non Af Amer 3 (L) >60 mL/min   GFR calc Af Amer 3 (L) >60 mL/min    Comment: (NOTE) The eGFR has been calculated using the CKD EPI equation. This calculation has not been validated in all clinical situations. eGFR's persistently <60 mL/min signify possible Chronic Kidney Disease.    Anion gap 22 (H) 5 - 15  Magnesium     Status: Abnormal   Collection Time: 05/15/16  5:15 AM  Result Value Ref Range   Magnesium 1.6 (L) 1.7 - 2.4 mg/dL  Glucose, capillary     Status: Abnormal   Collection Time: 05/15/16  5:24 AM  Result Value Ref Range   Glucose-Capillary 104 (H) 65 - 99 mg/dL  Glucose, capillary     Status: Abnormal   Collection Time: 05/15/16  6:30 AM  Result Value Ref Range   Glucose-Capillary 110 (H) 65 - 99 mg/dL  Glucose, capillary     Status: Abnormal   Collection Time: 05/15/16  7:35 AM  Result Value Ref Range   Glucose-Capillary 147 (H) 65 - 99 mg/dL  Glucose, capillary     Status: Abnormal   Collection Time: 05/15/16  8:39 AM  Result Value Ref Range   Glucose-Capillary 163 (H) 65 - 99 mg/dL  Basic metabolic panel     Status: Abnormal   Collection Time: 05/15/16  9:48 AM  Result Value Ref Range   Sodium 123 (L) 135 - 145 mmol/L   Potassium 4.5 3.5 - 5.1 mmol/L   Chloride 86 (L) 101 - 111 mmol/L   CO2 18 (L) 22 - 32 mmol/L   Glucose, Bld 162 (H) 65 - 99 mg/dL   BUN 66 (H) 6 - 20 mg/dL   Creatinine, Ser 14.65 (H) 0.44 - 1.00 mg/dL   Calcium 8.0 (L) 8.9 - 10.3 mg/dL   GFR calc non Af Amer 3 (L) >60 mL/min   GFR calc Af Amer 3 (L) >60 mL/min    Comment: (NOTE) The eGFR has been calculated using the CKD EPI equation. This calculation has not been validated in  all clinical situations. eGFR's persistently <60 mL/min signify possible Chronic Kidney Disease.    Anion gap 19 (H) 5 - 15  Troponin I (q 6hr  x 3)     Status: Abnormal   Collection Time: 05/15/16  9:48 AM  Result Value Ref Range   Troponin I 1.18 (HH) <0.03 ng/mL    Comment: CRITICAL VALUE NOTED.  VALUE IS CONSISTENT WITH PREVIOUSLY REPORTED AND CALLED VALUE.  CBC     Status: Abnormal   Collection Time: 05/15/16  9:48 AM  Result Value Ref Range   WBC 15.3 (H) 4.0 - 10.5 K/uL   RBC 4.33 3.87 - 5.11 MIL/uL   Hemoglobin 11.4 (L) 12.0 - 15.0 g/dL   HCT 32.4 (L) 36.0 - 46.0 %   MCV 74.8 (L) 78.0 - 100.0 fL   MCH 26.3 26.0 - 34.0 pg   MCHC 35.2 30.0 - 36.0 g/dL   RDW 15.6 (H) 11.5 - 15.5 %   Platelets 173 150 - 400 K/uL  Creatinine, serum     Status: Abnormal   Collection Time: 05/15/16  9:48 AM  Result Value Ref Range   Creatinine, Ser 14.80 (H) 0.44 - 1.00 mg/dL   GFR calc non Af Amer 3 (L) >60 mL/min   GFR calc Af Amer 3 (L) >60 mL/min    Comment: (NOTE) The eGFR has been calculated using the CKD EPI equation. This calculation has not been validated in all clinical situations. eGFR's persistently <60 mL/min signify possible Chronic Kidney Disease.   Glucose, capillary     Status: Abnormal   Collection Time: 05/15/16  9:56 AM  Result Value Ref Range   Glucose-Capillary 161 (H) 65 - 99 mg/dL  Glucose, capillary     Status: Abnormal   Collection Time: 05/15/16 10:43 AM  Result Value Ref Range   Glucose-Capillary 155 (H) 65 - 99 mg/dL  Glucose, capillary     Status: Abnormal   Collection Time: 05/15/16 11:39 AM  Result Value Ref Range   Glucose-Capillary 163 (H) 65 - 99 mg/dL  Glucose, capillary     Status: Abnormal   Collection Time: 05/15/16 12:50 PM  Result Value Ref Range   Glucose-Capillary 169 (H) 65 - 99 mg/dL  Glucose, capillary     Status: Abnormal   Collection Time: 05/15/16  2:00 PM  Result Value Ref Range   Glucose-Capillary 188 (H) 65 - 99 mg/dL  Basic  metabolic panel     Status: Abnormal   Collection Time: 05/15/16  2:49 PM  Result Value Ref Range   Sodium 122 (L) 135 - 145 mmol/L   Potassium 4.4 3.5 - 5.1 mmol/L   Chloride 88 (L) 101 - 111 mmol/L   CO2 18 (L) 22 - 32 mmol/L   Glucose, Bld 180 (H) 65 - 99 mg/dL   BUN 67 (H) 6 - 20 mg/dL   Creatinine, Ser 14.76 (H) 0.44 - 1.00 mg/dL   Calcium 7.8 (L) 8.9 - 10.3 mg/dL   GFR calc non Af Amer 3 (L) >60 mL/min   GFR calc Af Amer 3 (L) >60 mL/min    Comment: (NOTE) The eGFR has been calculated using the CKD EPI equation. This calculation has not been validated in all clinical situations. eGFR's persistently <60 mL/min signify possible Chronic Kidney Disease.    Anion gap 16 (H) 5 - 15  Troponin I     Status: Abnormal   Collection Time: 05/15/16  2:49 PM  Result Value Ref Range   Troponin I 1.28 (HH) <0.03 ng/mL    Comment: CRITICAL VALUE NOTED.  VALUE IS CONSISTENT WITH PREVIOUSLY REPORTED AND CALLED VALUE.  Glucose, capillary  Status: Abnormal   Collection Time: 05/15/16  2:53 PM  Result Value Ref Range   Glucose-Capillary 182 (H) 65 - 99 mg/dL  Glucose, capillary     Status: Abnormal   Collection Time: 05/15/16  4:07 PM  Result Value Ref Range   Glucose-Capillary 177 (H) 65 - 99 mg/dL  Glucose, capillary     Status: Abnormal   Collection Time: 05/15/16  5:24 PM  Result Value Ref Range   Glucose-Capillary 130 (H) 65 - 99 mg/dL  Glucose, capillary     Status: Abnormal   Collection Time: 05/15/16  6:29 PM  Result Value Ref Range   Glucose-Capillary 133 (H) 65 - 99 mg/dL  Glucose, capillary     Status: Abnormal   Collection Time: 05/15/16  7:36 PM  Result Value Ref Range   Glucose-Capillary 151 (H) 65 - 99 mg/dL  Glucose, capillary     Status: Abnormal   Collection Time: 05/15/16  8:40 PM  Result Value Ref Range   Glucose-Capillary 140 (H) 65 - 99 mg/dL  Basic metabolic panel     Status: Abnormal   Collection Time: 05/15/16  9:58 PM  Result Value Ref Range   Sodium  128 (L) 135 - 145 mmol/L   Potassium 3.8 3.5 - 5.1 mmol/L   Chloride 91 (L) 101 - 111 mmol/L   CO2 19 (L) 22 - 32 mmol/L   Glucose, Bld 134 (H) 65 - 99 mg/dL   BUN 39 (H) 6 - 20 mg/dL   Creatinine, Ser 9.34 (H) 0.44 - 1.00 mg/dL    Comment: DELTA CHECK NOTED   Calcium 7.6 (L) 8.9 - 10.3 mg/dL   GFR calc non Af Amer 5 (L) >60 mL/min   GFR calc Af Amer 5 (L) >60 mL/min    Comment: (NOTE) The eGFR has been calculated using the CKD EPI equation. This calculation has not been validated in all clinical situations. eGFR's persistently <60 mL/min signify possible Chronic Kidney Disease.    Anion gap 18 (H) 5 - 15  Troponin I (q 6hr x 3)     Status: Abnormal   Collection Time: 05/15/16  9:58 PM  Result Value Ref Range   Troponin I 1.16 (HH) <0.03 ng/mL    Comment: CRITICAL VALUE NOTED.  VALUE IS CONSISTENT WITH PREVIOUSLY REPORTED AND CALLED VALUE.  Glucose, capillary     Status: Abnormal   Collection Time: 05/15/16 10:26 PM  Result Value Ref Range   Glucose-Capillary 148 (H) 65 - 99 mg/dL  Glucose, capillary     Status: Abnormal   Collection Time: 05/16/16 12:13 AM  Result Value Ref Range   Glucose-Capillary 185 (H) 65 - 99 mg/dL  Glucose, capillary     Status: Abnormal   Collection Time: 05/16/16  1:36 AM  Result Value Ref Range   Glucose-Capillary 196 (H) 65 - 99 mg/dL  Glucose, capillary     Status: Abnormal   Collection Time: 05/16/16  2:37 AM  Result Value Ref Range   Glucose-Capillary 184 (H) 65 - 99 mg/dL  Glucose, capillary     Status: Abnormal   Collection Time: 05/16/16  4:25 AM  Result Value Ref Range   Glucose-Capillary 189 (H) 65 - 99 mg/dL  Glucose, capillary     Status: Abnormal   Collection Time: 05/16/16  6:11 AM  Result Value Ref Range   Glucose-Capillary 170 (H) 65 - 99 mg/dL  Glucose, capillary     Status: Abnormal   Collection Time: 05/16/16  8:04 AM  Result Value Ref Range   Glucose-Capillary 121 (H) 65 - 99 mg/dL    Imaging: Imaging results have  been reviewed  Tele- NSR with occasional PVCs      Assessment/Plan:   1. Active Problems: 2.   Kidney transplant (RLQ) failure and rejection 3.   ESRD (end stage renal disease) ([place]) 4.   Type 1 diabetes mellitus with ketoacidosis without coma ([place]) 5.   S/P CABG x 2 6.   DKA (diabetic ketoacidoses) ([place]) 7.   Microcytic anemia 8.   Hyponatremia 9.   Transaminitis 10.   Difficult intravenous access 11.   Time Spent Directly with Patient:  15 minutes  Length of Stay:  LOS: 2 days   Admitted yesterday for DKA. She was dialyzed yesterday. Metabolic abn improved . Lethargic this AM. Hemodynamically stable. Trop low and flat. Exam benign. 2D pending. If OK will S/O. No further W/U necessary.  Quay Burow 05/16/2016, 8:22 AM

## 2016-05-16 NOTE — Progress Notes (Signed)
[facility] ROUNDING NOTE   Subjective:   Interval History: much clearer mentally  - awake alert and oriented   Objective:  Vital signs in last 24 hours:  Temp:  [98.4 F (36.9 C)-99.7 F (37.6 C)] 99 F (37.2 C) (01/18 0300) Pulse Rate:  [66-87] 66 (01/18 0800) Resp:  [14-31] 18 (01/18 0800) BP: (122-187)/(71-112) 139/97 (01/18 0936) SpO2:  [95 %-100 %] 98 % (01/18 0800) FiO2 (%):  [40 %] 40 % (01/17 1500) Weight:  [57.2 kg (126 lb 1.7 oz)-58.2 kg (128 lb 4.9 oz)] 57.2 kg (126 lb 1.7 oz) (01/17 1825)  Weight change: 5.129 kg (11 lb 4.9 oz) Filed Weights   05/14/16 1250 05/15/16 1600 05/15/16 1825  Weight: 53.1 kg (117 lb) 58.2 kg (128 lb 4.9 oz) 57.2 kg (126 lb 1.7 oz)    Intake/Output: I/O last 3 completed shifts: In: 1256 [I.V.:1256] Out: 1100 [Other:1100]   Intake/Output this shift:  Total I/O In: 0347 [I.V.:1575] Out: -   CVS- RRR RS- CTA ABD- BS present soft non-distended EXT- no edema   Basic Metabolic Panel:  Recent Labs Lab 05/15/16 0210 05/15/16 0515 05/15/16 0948 05/15/16 1449 05/15/16 2158  NA 127* 126* 123* 122* 128*  K 3.5 3.9 4.5 4.4 3.8  CL 87* 86* 86* 88* 91*  CO2 18* 18* 18* 18* 19*  GLUCOSE 47* 83 162* 180* 134*  BUN 63* 63* 66* 67* 39*  CREATININE 14.16* 14.51* 14.80*  14.65* 14.76* 9.34*  CALCIUM 8.5* 8.2* 8.0* 7.8* 7.6*  MG  --  1.6*  --   --   --     Liver Function Tests:  Recent Labs Lab 05/14/16 1254  AST 110*  ALT 109*  ALKPHOS 97  BILITOT 0.8  PROT 6.7  ALBUMIN 2.9*   No results for input(s): LIPASE, AMYLASE in the last 168 hours. No results for input(s): AMMONIA in the last 168 hours.  CBC:  Recent Labs Lab 05/14/16 1254 05/14/16 1542 05/14/16 1805 05/15/16 0948  WBC 9.7  --   --  15.3*  NEUTROABS 8.2*  --   --   --   HGB 10.6* 12.2 11.2* 11.4*  HCT 31.8* 36.0 33.0* 32.4*  MCV 77.8*  --   --  74.8*  PLT 170  --   --  173    Cardiac Enzymes:  Recent Labs Lab 05/14/16 2251  05/15/16 0515 05/15/16 0948 05/15/16 1449 05/15/16 2158  TROPONINI 0.69* 0.91* 1.18* 1.28* 1.16*    BNP: Invalid input(s): POCBNP  CBG:  Recent Labs Lab 05/16/16 0237 05/16/16 0425 05/16/16 0611 05/16/16 0804 05/16/16 0909  GLUCAP 184* 189* 170* 121* 109*    Microbiology: Results for orders placed or performed during the hospital encounter of 05/14/16  MRSA PCR Screening     Status: None   Collection Time: 05/14/16  9:24 PM  Result Value Ref Range Status   MRSA by PCR NEGATIVE NEGATIVE Final    Comment:        The GeneXpert MRSA Assay (FDA approved for NASAL specimens only), is one component of a comprehensive MRSA colonization surveillance program. It is not intended to diagnose MRSA infection nor to guide or monitor treatment for MRSA infections.     Coagulation Studies:  Recent Labs  05/14/16 1519  LABPROT 17.4*  INR 1.42    Urinalysis: No results for input(s): COLORURINE, LABSPEC, PHURINE, GLUCOSEU, HGBUR, BILIRUBINUR, KETONESUR, PROTEINUR, UROBILINOGEN, NITRITE, LEUKOCYTESUR in the last 72 hours.  Invalid input(s): APPERANCEUR    Imaging: Dg  Chest 2 View  Result Date: 05/14/2016 CLINICAL DATA:  Cough. EXAM: CHEST  2 VIEW COMPARISON:  08/30/2014. FINDINGS: Mediastinum and hilar structures normal. Prior cardiac valve replacement and CABG. Cardiomegaly. Mild pulmonary vascular prominence and bilateral interstitial prominence with bilateral pleural effusions. Findings consistent with congestive heart failure. IMPRESSION: 1. Prior CABG.  Prior cardiac valve replacement. 2. Congestive heart failure with bilateral pulmonary interstitial edema and bilateral pleural effusions. Electronically Signed   By: Marcello Moores  Register   On: 05/14/2016 13:53   Portable Chest X-ray (1 View)  Result Date: 05/15/2016 CLINICAL DATA:  Congestive heart failure. History of renal and pancreas transplant. Underlying diabetes and hypertension. EXAM: PORTABLE CHEST 1 VIEW  COMPARISON:  05/14/2016 and 08/30/2014. FINDINGS: 0441 hour. Stable mild cardiac enlargement post CABG and mitral valve replacement. There is stable vascular congestion without overt pulmonary edema. Left pleural effusion and left basilar atelectasis/scarring appear unchanged from 2016. There is no pneumothorax. IMPRESSION: Stable cardiomegaly and chronic vascular congestion. Remaining left basilar pleuroparenchymal opacities appear chronic. Electronically Signed   By: Richardean Sale M.D.   On: 05/15/2016 07:47     Medications:   . dextrose 5 % and 0.9% NaCl    . insulin (NOVOLIN-R) infusion 1.1 Units/hr (05/16/16 9563)   . aspirin  325 mg Oral Daily  . benzonatate  100 mg Oral BID  . calcitRIOL  0.25 mcg Oral BID  . calcium acetate  2,001 mg Oral TID WC  . carvedilol  6.25 mg Oral BID WC  . hydrALAZINE  10 mg Oral TID  . pantoprazole  40 mg Oral Daily  . sodium bicarbonate  100 mEq Intravenous Once   sodium chloride, sodium chloride, alteplase, benzonatate, hydrALAZINE, lidocaine (PF), lidocaine-prilocaine, naLOXone (NARCAN)  injection, pentafluoroprop-tetrafluoroeth  Assessment/ Plan:  1. Uncontrolled DM /( bs 878)DKA - per admit team   170 - 121 -109  Much better control    2. ESRD -   1 treatment of HD yesterday and will transition to CAPD today 3. Hypertension/volume  - BP  138/97  4. Anemia  - Hb 11.4  5. Metabolic bone disease -  Po vit d / corec  Ca 9.1  Phos lo binder , fu ca Maren Reamer  Trend 6. HO CM / MVR/ CABG X2 = no chest pain   7. 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

## 2016-05-16 NOTE — Progress Notes (Signed)
Patient is very hesitant to have 2500 ml of dwelling volume.Started complaining of fullness when dwelling fluid is at 2 liters.

## 2016-05-16 NOTE — Progress Notes (Signed)
PROGRESS NOTE    Priscilla [name]  ZOX:[id number] DOB: 01-07-1971 DOA: 05/14/2016 PCP: [name], MD    Brief Narrative:   46 y/o ? 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 Joelyn Oms Prior cocaine Sickle cell trait htn History of CABG as well as mitral valve repair 81/19/1478 complicated by hypotension chronic diarrhea-supposed to have colonoscopy  Admitted with sugar 878 and DKA, potassium 5.1 and hypertensive and confused Troponin 0.53 Initial anion gap was 18, sodium 118, BUN/creatinine 58/15.7 Lactic acid 4.5    Assessment & Plan:   Active Problems:   Kidney transplant (RLQ) failure and rejection   ESRD (end stage renal disease) (HCC)   Type 1 diabetes mellitus with ketoacidosis without coma (HCC)   S/P CABG x 2   DKA (diabetic ketoacidoses) (HCC)   Microcytic anemia   Hyponatremia   Transaminitis   Difficult intravenous access   Toxic metabolic encephalopathy-secondary to DKA, uremia Secondary to uremia-completely resolved after half treatment dialysis Would hold OxyIR 5-10 every 6 when necessary number Remeron 15 at bedtime, gabapentin 100 daily, Phenergan 12.5 every 6 when necessary If her mentation does not improve after withdrawal of above medications I would look for source of infection and check dialysate Transfer to MedSurg  DKA/hyponatremia from osmotic diuresis-anion gap is still 22--dialysis should help with renal component of this I believe her acidosis is currently renal origin currently and we have discontinued CBG checks every hourly and we'll transition to 4 times a day before meals at bedtime and let the patient ate  ESRD on CPPD-will need peritoneal dialysis--has not received in the past couple of days. Nephrology is aware Metabolic bone disease as per nephrology  Anemia of renal disease Await iron studies--might need to replacement, might need IV  iron or erythropoietin  Prior CABG + mitral valve repair 07/18/15 Rising troponin--no overt chest pain but poor exam Heart was consulted at 1/18 and feel like this is secondary to renal disease elevation in troponin as patient does not have any chest pain no further workup  Hypertension-poorly controlled Be related to lack of dialysis.  Acute decompensated diastolic heart failure-last echo EF 55 Echocardiogram repeat is pending   DVT prophylaxis: Lovenox Code Status: Full Family Communication: none + Disposition Plan: sdu today--transferred to med surge   Consultants:   Nephrology  Procedures:   None yet  Antimicrobials:   None    Subjective:   much more alert awake No further issues Not really hungry No further diarrhea  Objective: Vitals:   05/16/16 0800 05/16/16 0936 05/16/16 1100 05/16/16 1200  BP: 122/71 (!) 139/97 120/79 102/60  Pulse: 66  68   Resp: 18  20 20   Temp:   98.8 F (37.1 C)   TempSrc:   Oral   SpO2: 98%  97%   Weight:      Height:        Intake/Output Summary (Last 24 hours) at 05/16/16 1353 Last data filed at 05/16/16 1000  Gross per 24 hour  Intake          1577.71 ml  Output             1100 ml  Net           477.71 ml   Filed Weights   05/14/16 1250 05/15/16 1600 05/15/16 1825  Weight: 53.1 kg (117 lb) 58.2 kg (128 lb 4.9 oz) 57.2 kg (126 lb 1.7 oz)    Examination:  General  exam: Awake alert no apparent distress Respiratory system: Clear to auscultation. Respiratory effort normal. Cardiovascular system: S1 & S2 heard, RRR. No JVD Gastrointestinal system: Abdomen is nondistended--nontender Central nervous system: Alert and oriented. No focal neurological deficits. Extremities: Symmetric 5 x 5 power. Reactive to menace Skin: No rashes, lesions or ulcers Psychiatry: Judgement and insight are poor and patient is sleepy.     Data Reviewed: I have personally reviewed following labs and imaging studies  CBC:  Recent  Labs Lab 05/14/16 1254 05/14/16 1542 05/14/16 1805 05/15/16 0948  WBC 9.7  --   --  15.3*  NEUTROABS 8.2*  --   --   --   HGB 10.6* 12.2 11.2* 11.4*  HCT 31.8* 36.0 33.0* 32.4*  MCV 77.8*  --   --  74.8*  PLT 170  --   --  762   Basic Metabolic Panel:  Recent Labs Lab 05/15/16 0210 05/15/16 0515 05/15/16 0948 05/15/16 1449 05/15/16 2158  NA 127* 126* 123* 122* 128*  K 3.5 3.9 4.5 4.4 3.8  CL 87* 86* 86* 88* 91*  CO2 18* 18* 18* 18* 19*  GLUCOSE 47* 83 162* 180* 134*  BUN 63* 63* 66* 67* 39*  CREATININE 14.16* 14.51* 14.80*  14.65* 14.76* 9.34*  CALCIUM 8.5* 8.2* 8.0* 7.8* 7.6*  MG  --  1.6*  --   --   --    GFR: Estimated Creatinine Clearance: 6.1 mL/min (by C-G formula based on SCr of 9.34 mg/dL (H)). Liver Function Tests:  Recent Labs Lab 05/14/16 1254  AST 110*  ALT 109*  ALKPHOS 97  BILITOT 0.8  PROT 6.7  ALBUMIN 2.9*   No results for input(s): LIPASE, AMYLASE in the last 168 hours. No results for input(s): AMMONIA in the last 168 hours. Coagulation Profile:  Recent Labs Lab 05/14/16 1519  INR 1.42   Cardiac Enzymes:  Recent Labs Lab 05/14/16 2251 05/15/16 0515 05/15/16 0948 05/15/16 1449 05/15/16 2158  TROPONINI 0.69* 0.91* 1.18* 1.28* 1.16*   BNP (last 3 results) No results for input(s): PROBNP in the last 8760 hours. HbA1C:  Recent Labs  05/14/16 1907  HGBA1C 15.0*   CBG:  Recent Labs Lab 05/16/16 0425 05/16/16 0611 05/16/16 0804 05/16/16 0909 05/16/16 1122  GLUCAP 189* 170* 121* 109* 126*   Lipid Profile: No results for input(s): CHOL, HDL, LDLCALC, TRIG, CHOLHDL, LDLDIRECT in the last 72 hours. Thyroid Function Tests: No results for input(s): TSH, T4TOTAL, FREET4, T3FREE, THYROIDAB in the last 72 hours. Anemia Panel: No results for input(s): VITAMINB12, FOLATE, FERRITIN, TIBC, IRON, RETICCTPCT in the last 72 hours. Sepsis Labs:  Recent Labs Lab 05/14/16 1314 05/14/16 1529 05/14/16 1805  LATICACIDVEN 4.56*  4.05*  4.05* 3.51*    Recent Results (from the past 240 hour(s))  MRSA PCR Screening     Status: None   Collection Time: 05/14/16  9:24 PM  Result Value Ref Range Status   MRSA by PCR NEGATIVE NEGATIVE Final    Comment:        The GeneXpert MRSA Assay (FDA approved for NASAL specimens only), is one component of a comprehensive MRSA colonization surveillance program. It is not intended to diagnose MRSA infection nor to guide or monitor treatment for MRSA infections.          Radiology Studies: Portable Chest X-ray (1 View)  Result Date: 05/15/2016 CLINICAL DATA:  Congestive heart failure. History of renal and pancreas transplant. Underlying diabetes and hypertension. EXAM: PORTABLE CHEST 1 VIEW COMPARISON:  05/14/2016  and 08/30/2014. FINDINGS: 0441 hour. Stable mild cardiac enlargement post CABG and mitral valve replacement. There is stable vascular congestion without overt pulmonary edema. Left pleural effusion and left basilar atelectasis/scarring appear unchanged from 2016. There is no pneumothorax. IMPRESSION: Stable cardiomegaly and chronic vascular congestion. Remaining left basilar pleuroparenchymal opacities appear chronic. Electronically Signed   By: Richardean Sale M.D.   On: 05/15/2016 07:47        Scheduled Meds: . aspirin  325 mg Oral Daily  . benzonatate  100 mg Oral BID  . calcitRIOL  0.25 mcg Oral BID  . calcium acetate  2,001 mg Oral TID WC  . carvedilol  6.25 mg Oral BID WC  . hydrALAZINE  10 mg Oral TID  . pantoprazole  40 mg Oral Daily  . sodium bicarbonate  100 mEq Intravenous Once   Continuous Infusions: . dextrose 5 % and 0.9% NaCl 75 mL/hr at 05/16/16 1015  . insulin (NOVOLIN-R) infusion 1.1 Units/hr (05/16/16 9381)     LOS: 2 days    Time spent: [place], [place], MD [facility] Pager [phone]  If 7PM-7AM, please contact night-coverage [email or web address] Password [email or web address] 05/16/2016, 1:53 PM

## 2016-05-17 DIAGNOSIS — R748 Abnormal levels of other serum enzymes: Secondary | ICD-10-CM

## 2016-05-17 LAB — BASIC METABOLIC PANEL
Anion gap: 15 (ref 5–15)
BUN: 40 mg/dL — AB (ref 6–20)
CALCIUM: 8 mg/dL — AB (ref 8.9–10.3)
CO2: 20 mmol/L — AB (ref 22–32)
CREATININE: 10.5 mg/dL — AB (ref 0.44–1.00)
Chloride: 88 mmol/L — ABNORMAL LOW (ref 101–111)
GFR calc Af Amer: 5 mL/min — ABNORMAL LOW (ref >60)
GFR calc non Af Amer: 4 mL/min — ABNORMAL LOW (ref >60)
GLUCOSE: 400 mg/dL — AB (ref 65–99)
Potassium: 3.7 mmol/L (ref 3.5–5.1)
Sodium: 123 mmol/L — ABNORMAL LOW (ref 135–145)

## 2016-05-17 LAB — GLUCOSE, CAPILLARY
GLUCOSE-CAPILLARY: 215 mg/dL — AB (ref 65–99)
Glucose-Capillary: 362 mg/dL — ABNORMAL HIGH (ref 65–99)
Glucose-Capillary: 303 mg/dL — ABNORMAL HIGH (ref 65–99)
Glucose-Capillary: 65 mg/dL (ref 65–99)
Glucose-Capillary: 77 mg/dL (ref 65–99)
Glucose-Capillary: 48 mg/dL — ABNORMAL LOW (ref 65–99)

## 2016-05-17 LAB — HEPATITIS B SURFACE ANTIGEN: Hepatitis B Surface Ag: NEGATIVE

## 2016-05-17 LAB — HEPATITIS B CORE ANTIBODY, TOTAL: HEP B C TOTAL AB: NEGATIVE

## 2016-05-17 LAB — HEPATITIS B SURFACE ANTIBODY,QUALITATIVE: Hep B S Ab: REACTIVE

## 2016-05-17 MED ORDER — FLUTICASONE PROPIONATE 50 MCG/ACT NA SUSP
1.0000 | Freq: Every day | NASAL | Status: DC
  Administered 2016-05-17 – 2016-05-21 (×5): 1 via NASAL
  Filled 2016-05-17: qty 16

## 2016-05-17 MED ORDER — DELFLEX-LC/2.5% DEXTROSE 394 MOSM/L IP SOLN
INTRAPERITONEAL | Status: DC
  Administered 2016-05-18: 5000 mL via INTRAPERITONEAL

## 2016-05-17 MED ORDER — DELFLEX-LC/2.5% DEXTROSE 394 MOSM/L IP SOLN: INTRAPERITONEAL | Status: DC

## 2016-05-17 MED ORDER — GENTAMICIN SULFATE 0.1 % EX CREA: 1.0000 "application " | TOPICAL_CREAM | Freq: Every day | CUTANEOUS | Status: DC

## 2016-05-17 MED ORDER — INSULIN GLARGINE 100 UNIT/ML ~~LOC~~ SOLN
10.0000 [IU] | Freq: Every day | SUBCUTANEOUS | Status: DC
  Filled 2016-05-17 (×2): qty 0.1

## 2016-05-17 MED ORDER — DELFLEX-LC/4.25% DEXTROSE 483 MOSM/L IP SOLN: INTRAPERITONEAL | Status: DC

## 2016-05-17 MED ORDER — DELFLEX-LC/1.5% DEXTROSE 344 MOSM/L IP SOLN
Freq: Four times a day (QID) | INTRAPERITONEAL | Status: DC
  Administered 2016-05-17: 07:00:00 via INTRAPERITONEAL

## 2016-05-17 MED ORDER — DEXTROSE 50 % IV SOLN
INTRAVENOUS | Status: AC
  Administered 2016-05-17: 50 mL
  Filled 2016-05-17: qty 50

## 2016-05-17 MED ORDER — HEPARIN 1000 UNIT/ML FOR PERITONEAL DIALYSIS
2500.0000 [IU] | INTRAMUSCULAR | Status: DC | PRN
  Filled 2016-05-17: qty 2.5

## 2016-05-17 MED ORDER — DELFLEX-LC/1.5% DEXTROSE 344 MOSM/L IP SOLN: 2000.0000 mL | INTRAPERITONEAL | Status: DC

## 2016-05-17 NOTE — Progress Notes (Signed)
[facility] ROUNDING NOTE   Subjective:   Interval History:cough several months   productive - whitish phlegm   Objective:  Vital signs in last 24 hours:  Temp:  [97.8 F (36.6 C)-98.8 F (37.1 C)] 97.8 F (36.6 C) (01/19 0822) Pulse Rate:  [54-83] 81 (01/19 0822) Resp:  [18-20] 20 (01/19 0822) BP: (102-136)/(56-79) 136/66 (01/19 0822) SpO2:  [97 %-99 %] 98 % (01/19 0822) Weight:  [56.7 kg (125 lb)] 56.7 kg (125 lb) (01/18 2042)  Weight change: -1.5 kg (-3 lb 4.9 oz) Filed Weights   05/15/16 1600 05/15/16 1825 05/16/16 2042  Weight: 58.2 kg (128 lb 4.9 oz) 57.2 kg (126 lb 1.7 oz) 56.7 kg (125 lb)    Intake/Output: I/O last 3 completed shifts: In: 0263 [P.O.:462; I.V.:1575; Other:2000] Out: 2000 [Other:2000]   Intake/Output this shift:  Total I/O In: 240 [P.O.:240] Out: 2000 [Other:2000]  CVS- RRR  Systolic murmurs  RS- CTA diminished ABD- BS present soft non-distended  PD catheter  EXT- no edema   Basic Metabolic Panel:  Recent Labs Lab 05/15/16 0515 05/15/16 0948 05/15/16 1449 05/15/16 2158 05/17/16 0809  NA 126* 123* 122* 128* 123*  K 3.9 4.5 4.4 3.8 3.7  CL 86* 86* 88* 91* 88*  CO2 18* 18* 18* 19* 20*  GLUCOSE 83 162* 180* 134* 400*  BUN 63* 66* 67* 39* 40*  CREATININE 14.51* 14.80*  14.65* 14.76* 9.34* 10.50*  CALCIUM 8.2* 8.0* 7.8* 7.6* 8.0*  MG 1.6*  --   --   --   --     Liver Function Tests:  Recent Labs Lab 05/14/16 1254  AST 110*  ALT 109*  ALKPHOS 97  BILITOT 0.8  PROT 6.7  ALBUMIN 2.9*   No results for input(s): LIPASE, AMYLASE in the last 168 hours. No results for input(s): AMMONIA in the last 168 hours.  CBC:  Recent Labs Lab 05/14/16 1254 05/14/16 1542 05/14/16 1805 05/15/16 0948  WBC 9.7  --   --  15.3*  NEUTROABS 8.2*  --   --   --   HGB 10.6* 12.2 11.2* 11.4*  HCT 31.8* 36.0 33.0* 32.4*  MCV 77.8*  --   --  74.8*  PLT 170  --   --  173    Cardiac Enzymes:  Recent Labs Lab 05/14/16 2251  05/15/16 0515 05/15/16 0948 05/15/16 1449 05/15/16 2158  TROPONINI 0.69* 0.91* 1.18* 1.28* 1.16*    BNP: Invalid input(s): POCBNP  CBG:  Recent Labs Lab 05/16/16 0909 05/16/16 1122 05/16/16 1720 05/16/16 2040 05/17/16 0753  GLUCAP 109* 126* 200* 292* 362*    Microbiology: Results for orders placed or performed during the hospital encounter of 05/14/16  MRSA PCR Screening     Status: None   Collection Time: 05/14/16  9:24 PM  Result Value Ref Range Status   MRSA by PCR NEGATIVE NEGATIVE Final    Comment:        The GeneXpert MRSA Assay (FDA approved for NASAL specimens only), is one component of a comprehensive MRSA colonization surveillance program. It is not intended to diagnose MRSA infection nor to guide or monitor treatment for MRSA infections.     Coagulation Studies:  Recent Labs  05/14/16 1519  LABPROT 17.4*  INR 1.42    Urinalysis: No results for input(s): COLORURINE, LABSPEC, PHURINE, GLUCOSEU, HGBUR, BILIRUBINUR, KETONESUR, PROTEINUR, UROBILINOGEN, NITRITE, LEUKOCYTESUR in the last 72 hours.  Invalid input(s): APPERANCEUR    Imaging: No results found.   Medications:   .  insulin (NOVOLIN-R) infusion 1.1 Units/hr (05/16/16 2482)   . aspirin  325 mg Oral Daily  . benzonatate  100 mg Oral BID  . calcitRIOL  0.25 mcg Oral BID  . calcium acetate  2,001 mg Oral TID WC  . carvedilol  6.25 mg Oral BID WC  . dialysis solution for CAPD/CCPD (DELFLEX)   Peritoneal Dialysis QID  . gentamicin cream   Topical Daily  . hydrALAZINE  10 mg Oral TID  . insulin aspart  0-15 Units Subcutaneous TID WC  . insulin aspart  3 Units Subcutaneous TID WC  . pantoprazole  40 mg Oral Daily   sodium chloride, sodium chloride, alteplase, benzonatate, hydrALAZINE, HYDROcodone-homatropine, lidocaine (PF), lidocaine-prilocaine, naLOXone (NARCAN)  injection, pentafluoroprop-tetrafluoroeth  Assessment/ Plan:  1. Uncontrolled DM /( bs 878)DKA - per admit team   170  - 121 -109  Much better control    2. ESRD -  1 treatment of HD yesterday and will transition to CAPD today 3. Hypertension/volume - BP  138/97  4. Anemia - Hb 11.4  5. Metabolic bone disease - Po vit d / corec Ca 9.1 Phos lo binder , fu ca Maren Reamer Trend 6. HO CM / MVR/ CABG X2 = no chest pain  7. 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 Cough persistent chronic consider nasal steroids      LOS: 3 Joeann Steppe W @TODAY @10 :45 AM

## 2016-05-17 NOTE — Progress Notes (Signed)
Hypoglycemic Event  CBG: 48  Treatment: D50 IV 50 mL  Symptoms: Sweaty  Follow-up CBG: [id number] CBG Result:215  Possible Reasons for Event: Inadequate meal intake  Comments/MD notified: Katheren Puller I

## 2016-05-17 NOTE — Progress Notes (Signed)
PROGRESS NOTE    Priscilla [name]  UVO:[id number] DOB: Nov 20, 1970 DOA: 05/14/2016 PCP: [name], MD    Brief Narrative:   46 y/o ? 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 Joelyn Oms Prior cocaine Sickle cell trait htn History of CABG as well as mitral valve repair 74/25/9563 complicated by hypotension chronic diarrhea-supposed to have colonoscopy  Admitted with sugar 878 and DKA, potassium 5.1 and hypertensive and confused Troponin 0.53 Initial anion gap was 18, sodium 118, BUN/creatinine 58/15.7 Lactic acid 4.5    Assessment & Plan:   Active Problems:   Kidney transplant (RLQ) failure and rejection   ESRD (end stage renal disease) (HCC)   Type 1 diabetes mellitus with ketoacidosis without coma (HCC)   S/P CABG x 2   DKA (diabetic ketoacidoses) (HCC)   Microcytic anemia   Hyponatremia   Transaminitis   Difficult intravenous access   Toxic metabolic encephalopathy-secondary to DKA, uremia Secondary to uremia-completely resolved after half treatment dialysis Would hold OxyIR 5-10 every 6 when necessary number Remeron 15 at bedtime, gabapentin 100 daily, Phenergan 12.5 every 6 when necessary Mentation has improved Transfer to MedSurg  diabetes mellitus type II DKA/hyponatremia from osmotic diuresis-anion gap is still 22--dialysis should help with renal component of this  acidosis is currently renal origin transition to 4 times a day before meals at bedtime diabetic diet Blood sugar is ranging between 300-400 Add Lantus 10 units back  ESRD on CPPD-will need peritoneal dialysis--has not received in the past couple of days. Nephrology is aware Metabolic bone disease as per nephrology  Anemia of renal disease Await iron studies--might need to replacement, might need IV iron or erythropoietin  Prior CABG + mitral valve repair 07/18/15 Rising troponin--no overt chest  pain but poor exam Heart was consulted at 1/18 and feel like this is secondary to renal disease elevation in troponin as patient does not have any chest pain no further workup could pursue outpatient work-up with cath [unless strongly felt necessary to stay in patient ]  as not having active chest pain currently and troponin flat trend was flat during initial hospital stay  Hypertension-poorly controlled Be related to lack of dialysis.  Acute decompensated diastolic heart failure-last echo EF 55 Echocardiogram 1/18 EF 45-50%, mild mitral regurg no specific wall motion abnormality noted  Cough-probably bronchitis versus sinusitis and have added Flonase   DVT prophylaxis: Lovenox Code Status: Full Family Communication: none + Disposition Plan: sdu today--transferred to med surge   Consultants:   Nephrology  Procedures:   None yet  Antimicrobials:   None    Subjective:  Alert pleasant oriented no apparent distress  Eating and drinking  No other issues    Objective: Vitals:   05/16/16 2042 05/16/16 2333 05/17/16 0604 05/17/16 0822  BP: 123/69 114/61 133/77 136/66  Pulse: 83 69 (!) 54 81  Resp: 19  19 20   Temp: 98 F (36.7 C)  98.7 F (37.1 C) 97.8 F (36.6 C)  TempSrc: Oral  Oral Oral  SpO2: 99%  98% 98%  Weight: 56.7 kg (125 lb)     Height:        Intake/Output Summary (Last 24 hours) at 05/17/16 1232 Last data filed at 05/17/16 0913  Gross per 24 hour  Intake             2702 ml  Output             4000 ml  Net            -  1298 ml   Filed Weights   05/15/16 1600 05/15/16 1825 05/16/16 2042  Weight: 58.2 kg (128 lb 4.9 oz) 57.2 kg (126 lb 1.7 oz) 56.7 kg (125 lb)    Examination:  General exam: Awake alert no apparent distress--much more awake alert Respiratory system: Clear to auscultation. Respiratory effort normal. Cardiovascular system: S1 & S2 heard, RRR. No JVD Gastrointestinal system: Abdomen is nondistended--nontender Central nervous  system: Alert and oriented. No focal neurological deficits. Extremities: Symmetric 5 x 5 power.  Skin: No rashes, lesions or ulcers Psychiatry: Judgement and insight are poor and patient is sleepy.     Data Reviewed: I have personally reviewed following labs and imaging studies  CBC:  Recent Labs Lab 05/14/16 1254 05/14/16 1542 05/14/16 1805 05/15/16 0948  WBC 9.7  --   --  15.3*  NEUTROABS 8.2*  --   --   --   HGB 10.6* 12.2 11.2* 11.4*  HCT 31.8* 36.0 33.0* 32.4*  MCV 77.8*  --   --  74.8*  PLT 170  --   --  300   Basic Metabolic Panel:  Recent Labs Lab 05/15/16 0515 05/15/16 0948 05/15/16 1449 05/15/16 2158 05/17/16 0809  NA 126* 123* 122* 128* 123*  K 3.9 4.5 4.4 3.8 3.7  CL 86* 86* 88* 91* 88*  CO2 18* 18* 18* 19* 20*  GLUCOSE 83 162* 180* 134* 400*  BUN 63* 66* 67* 39* 40*  CREATININE 14.51* 14.80*  14.65* 14.76* 9.34* 10.50*  CALCIUM 8.2* 8.0* 7.8* 7.6* 8.0*  MG 1.6*  --   --   --   --    GFR: Estimated Creatinine Clearance: 5.4 mL/min (by C-G formula based on SCr of 10.5 mg/dL (H)). Liver Function Tests:  Recent Labs Lab 05/14/16 1254  AST 110*  ALT 109*  ALKPHOS 97  BILITOT 0.8  PROT 6.7  ALBUMIN 2.9*   No results for input(s): LIPASE, AMYLASE in the last 168 hours. No results for input(s): AMMONIA in the last 168 hours. Coagulation Profile:  Recent Labs Lab 05/14/16 1519  INR 1.42   Cardiac Enzymes:  Recent Labs Lab 05/14/16 2251 05/15/16 0515 05/15/16 0948 05/15/16 1449 05/15/16 2158  TROPONINI 0.69* 0.91* 1.18* 1.28* 1.16*   BNP (last 3 results) No results for input(s): PROBNP in the last 8760 hours. HbA1C:  Recent Labs  05/14/16 1907  HGBA1C 15.0*   CBG:  Recent Labs Lab 05/16/16 1122 05/16/16 1720 05/16/16 2040 05/17/16 0753 05/17/16 1126  GLUCAP 126* 200* 292* 362* 303*   Lipid Profile: No results for input(s): CHOL, HDL, LDLCALC, TRIG, CHOLHDL, LDLDIRECT in the last 72 hours. Thyroid Function  Tests: No results for input(s): TSH, T4TOTAL, FREET4, T3FREE, THYROIDAB in the last 72 hours. Anemia Panel: No results for input(s): VITAMINB12, FOLATE, FERRITIN, TIBC, IRON, RETICCTPCT in the last 72 hours. Sepsis Labs:  Recent Labs Lab 05/14/16 1314 05/14/16 1529 05/14/16 1805  LATICACIDVEN 4.56* 4.05*  4.05* 3.51*    Recent Results (from the past 240 hour(s))  MRSA PCR Screening     Status: None   Collection Time: 05/14/16  9:24 PM  Result Value Ref Range Status   MRSA by PCR NEGATIVE NEGATIVE Final    Comment:        The GeneXpert MRSA Assay (FDA approved for NASAL specimens only), is one component of a comprehensive MRSA colonization surveillance program. It is not intended to diagnose MRSA infection nor to guide or monitor treatment for MRSA infections.  Radiology Studies: No results found.      Scheduled Meds: . aspirin  325 mg Oral Daily  . benzonatate  100 mg Oral BID  . calcitRIOL  0.25 mcg Oral BID  . calcium acetate  2,001 mg Oral TID WC  . carvedilol  6.25 mg Oral BID WC  . dialysis solution for CAPD/CCPD (DELFLEX)   Peritoneal Dialysis QID  . fluticasone  1 spray Each Nare Daily  . gentamicin cream   Topical Daily  . hydrALAZINE  10 mg Oral TID  . insulin aspart  0-15 Units Subcutaneous TID WC  . insulin aspart  3 Units Subcutaneous TID WC  . insulin glargine  10 Units Subcutaneous Daily  . pantoprazole  40 mg Oral Daily   Continuous Infusions:    LOS: 3 days    Time spent: 25    Nita Sells, MD [facility] Pager [phone]  If 7PM-7AM, please contact night-coverage [email or web address] Password [facility] 05/17/2016, 12:32 PM

## 2016-05-17 NOTE — Progress Notes (Signed)
Progress Note  Patient Name: Priscilla [name] Date of Encounter: 05/17/2016  Primary Cardiologist: Dr. Sallyanne Kuster   Subjective   Awake, alert. Very talkative. Denies chest pain and SOB.   Inpatient Medications    Scheduled Meds: . aspirin  325 mg Oral Daily  . benzonatate  100 mg Oral BID  . calcitRIOL  0.25 mcg Oral BID  . calcium acetate  2,001 mg Oral TID WC  . carvedilol  6.25 mg Oral BID WC  . dialysis solution for CAPD/CCPD (DELFLEX)   Peritoneal Dialysis QID  . gentamicin cream   Topical Daily  . hydrALAZINE  10 mg Oral TID  . insulin aspart  0-15 Units Subcutaneous TID WC  . insulin aspart  3 Units Subcutaneous TID WC  . pantoprazole  40 mg Oral Daily   Continuous Infusions: . insulin (NOVOLIN-R) infusion 1.1 Units/hr (05/16/16 9702)   PRN Meds: sodium chloride, sodium chloride, alteplase, benzonatate, hydrALAZINE, HYDROcodone-homatropine, lidocaine (PF), lidocaine-prilocaine, naLOXone (NARCAN)  injection, pentafluoroprop-tetrafluoroeth   Vital Signs    Vitals:   05/16/16 2042 05/16/16 2333 05/17/16 0604 05/17/16 0822  BP: 123/69 114/61 133/77 136/66  Pulse: 83 69 (!) 54 81  Resp: 19  19 20   Temp: 98 F (36.7 C)  98.7 F (37.1 C) 97.8 F (36.6 C)  TempSrc: Oral  Oral Oral  SpO2: 99%  98% 98%  Weight: 125 lb (56.7 kg)     Height:        Intake/Output Summary (Last 24 hours) at 05/17/16 1040 Last data filed at 05/17/16 0913  Gross per 24 hour  Intake             2702 ml  Output             4000 ml  Net            -1298 ml   Filed Weights   05/15/16 1600 05/15/16 1825 05/16/16 2042  Weight: 128 lb 4.9 oz (58.2 kg) 126 lb 1.7 oz (57.2 kg) 125 lb (56.7 kg)    Telemetry    NSR - Personally Reviewed  ECG    NSR with RBBB- Personally Reviewed  Physical Exam   GEN: No acute distress.  Neck: No JVD or bruits. Cardiac: RRR no murmurs, rubs, or gallops.  Radials/DP/PT 1+ and equal bilaterally.  Respiratory: Clear to auscultation  bilaterally. GI: Soft, nontender, non-distended, BS +x 4. MS: no deformity. Neuro:  AAOx3.  Labs    Chemistry Recent Labs Lab 05/14/16 1254  05/15/16 1449 05/15/16 2158 05/17/16 0809  NA 118*  < > 122* 128* 123*  K 5.1  < > 4.4 3.8 3.7  CL 81*  < > 88* 91* 88*  CO2 15*  < > 18* 19* 20*  GLUCOSE 878*  < > 180* 134* 400*  BUN 61*  < > 67* 39* 40*  CREATININE 14.72*  < > 14.76* 9.34* 10.50*  CALCIUM 8.2*  < > 7.8* 7.6* 8.0*  PROT 6.7  --   --   --   --   ALBUMIN 2.9*  --   --   --   --   AST 110*  --   --   --   --   ALT 109*  --   --   --   --   ALKPHOS 97  --   --   --   --   BILITOT 0.8  --   --   --   --   GFRNONAA 3*  < >  3* 5* 4*  GFRAA 3*  < > 3* 5* 5*  ANIONGAP 22*  < > 16* 18* 15  < > = values in this interval not displayed.   Hematology Recent Labs Lab 05/14/16 1254 05/14/16 1542 05/14/16 1805 05/15/16 0948  WBC 9.7  --   --  15.3*  RBC 4.09  --   --  4.33  HGB 10.6* 12.2 11.2* 11.4*  HCT 31.8* 36.0 33.0* 32.4*  MCV 77.8*  --   --  74.8*  MCH 25.9*  --   --  26.3  MCHC 33.3  --   --  35.2  RDW 15.9*  --   --  15.6*  PLT 170  --   --  173    Cardiac Enzymes Recent Labs Lab 05/15/16 0515 05/15/16 0948 05/15/16 1449 05/15/16 2158  TROPONINI 0.91* 1.18* 1.28* 1.16*   No results for input(s): TROPIPOC in the last 168 hours.     Radiology    No results found.  Cardiac Studies  Transthoracic Echocardiography 05/16/16 Study Conclusions  - Left ventricle: Inferior and septal hypokinesis. The cavity size   was normal. Wall thickness was normal. Systolic function was   mildly reduced. The estimated ejection fraction was in the range   of 45% to 50%. The study is not technically sufficient to allow   evaluation of LV diastolic function. - Mitral valve: Severe annular calcification and leaflet thickening   Small gradient across MV but no MS   by P 1/2T. There was mild regurgitation. Valve area by pressure   half-time: 2.27 cm^2. Valve area by  continuity equation (using   LVOT flow): 0.41 cm^2. - Left atrium: The atrium was mildly dilated. - Atrial septum: No defect or patent foramen ovale was identified. - Pulmonary arteries: PA peak pressure: 38 mm Hg (S   Patient Profile     46 y.o. female with PMH of type I DM, ESRD on peritoneal dialysis with failed kidney transplant, h/o pancreatic transplant, HTN, CAD s/p CABG x 2 (LIMA-LAD, SVG-PDA) and s/p mitral valve repair 07/18/2014 presented with AMS and found to have DKA and Cr 15, started on HD. Trop > 1. Cardiology consulted for elevated trop.   Assessment & Plan    1. Elevated troponin: In setting of DKA and ESRD. However, patient has a history of CAD and had CABG in 2016.   Of note, she tells me that she had a heart cath in Sept. 2017 at [facility] and was told that one of her bypass grafts was occluded. The Cardiologist there told her to follow up with Dr. Sallyanne Kuster in 3 days, but she did not. She has had no ischemic symptoms, troponin with flat trend. Will continue medical management.   2. History of CAD s/p CABG  3. DKA: per primary team  4. S/p mitral valve annulospasty  Signed, Arbutus Leas, NP  05/17/2016, 10:40 AM    Agree with note by Jettie Booze NP  Troponins low and flat. CRI on dialysis. 2D shosw slightly worse LV fxn c/w prior study but still OK. No CP. Exam benign. No need for further w/u . Will see again as OP. Pt already has an appointment with Dr Sallyanne Kuster 2/5 which she can keep.  Lorretta Harp, M.D., [place], [facility], [place] [address] [place], [place]  [address]  [phone] 05/17/2016 2:42 PM

## 2016-05-17 NOTE — Care Management Important Message (Signed)
Important Message  Patient Details  Name: Priscilla [name] MRN: [id number] Date of Birth: 1970/05/27   Medicare Important Message Given:  Yes    Shaleta Ruacho 05/17/2016, 1:32 PM

## 2016-05-18 ENCOUNTER — Inpatient Hospital Stay (HOSPITAL_COMMUNITY): Payer: Medicare Other

## 2016-05-18 LAB — GLUCOSE, CAPILLARY
GLUCOSE-CAPILLARY: 496 mg/dL — AB (ref 65–99)
GLUCOSE-CAPILLARY: 77 mg/dL (ref 65–99)
Glucose-Capillary: 455 mg/dL — ABNORMAL HIGH (ref 65–99)
Glucose-Capillary: 358 mg/dL — ABNORMAL HIGH (ref 65–99)
Glucose-Capillary: 93 mg/dL (ref 65–99)
Glucose-Capillary: 53 mg/dL — ABNORMAL LOW (ref 65–99)
Glucose-Capillary: 141 mg/dL — ABNORMAL HIGH (ref 65–99)

## 2016-05-18 MED ORDER — PANTOPRAZOLE SODIUM 40 MG PO TBEC
40.0000 mg | DELAYED_RELEASE_TABLET | Freq: Two times a day (BID) | ORAL | Status: DC
  Administered 2016-05-18 – 2016-05-21 (×6): 40 mg via ORAL
  Filled 2016-05-18 (×7): qty 1

## 2016-05-18 MED ORDER — INSULIN ASPART 100 UNIT/ML ~~LOC~~ SOLN
4.0000 [IU] | Freq: Once | SUBCUTANEOUS | Status: AC
  Administered 2016-05-18: 4 [IU] via SUBCUTANEOUS

## 2016-05-18 MED ORDER — INSULIN GLARGINE 100 UNIT/ML ~~LOC~~ SOLN
5.0000 [IU] | Freq: Every day | SUBCUTANEOUS | Status: DC
  Administered 2016-05-18 – 2016-05-21 (×4): 5 [IU] via SUBCUTANEOUS
  Filled 2016-05-18 (×5): qty 0.05

## 2016-05-18 NOTE — Progress Notes (Signed)
PROGRESS NOTE    Priscilla [name]  WLN:[id number] DOB: 1971-01-15 DOA: 05/14/2016 PCP: [name], MD    Brief Narrative:   46 y/o ? 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 Joelyn Oms Prior cocaine Sickle cell trait htn History of CABG as well as mitral valve repair 74/11/1446 complicated by hypotension chronic diarrhea-supposed to have colonoscopy  Admitted with sugar 878 and DKA, potassium 5.1 and hypertensive and confused Troponin 0.53 Initial anion gap was 18, sodium 118, BUN/creatinine 58/15.7 Lactic acid 4.5    Assessment & Plan:   Active Problems:   Kidney transplant (RLQ) failure and rejection   ESRD (end stage renal disease) (HCC)   Type 1 diabetes mellitus with ketoacidosis without coma (HCC)   S/P CABG x 2   DKA (diabetic ketoacidoses) (HCC)   Microcytic anemia   Hyponatremia   Transaminitis   Difficult intravenous access   Elevation of cardiac enzymes   Toxic metabolic encephalopathy-secondary to DKA, uremia Secondary to uremia-completely resolved after half treatment dialysis Would hold OxyIR 5-10 every 6 when necessary number Remeron 15 at bedtime, gabapentin 100 daily, Phenergan 12.5 every 6 when necessary Mentation has improved Transfer to MedSurg  diabetes mellitus type II DKA/hyponatremia from osmotic diuresis-anion gap is still 22--dialysis should help with renal component of this  acidosis is currently renal origin transition to 4 times a day before meals at bedtime diabetic diet Blood sugar labile yesterday to 40 and 60 Lowered her Lantus from 10 to 5 units 05/18/2016, careful titration of the same Will need to monitor 1 more day to ensure stability of that sugars  ESRD on CPPD-will need peritoneal dialysis Metabolic bone disease as per nephrology  Anemia of renal disease hemoglobin reasonable 11  check labs a.m.   Prior CABG + mitral  valve repair 07/18/15 Rising troponin--no overt chest pain but poor exam Heart was consulted at 1/18 and feel like this is secondary to renal disease elevation in troponin as patient does not have any chest pain no further workup could pursue outpatient work-up with cath [unless strongly felt necessary to stay in patient ]  as not having active chest pain currently and troponin flat trend was flat during initial hospital stay  Hypertension-poorly controlled 2/2  lack of dialysis. Very much better controlled  Acute decompensated diastolic heart failure-last echo EF 55 Echocardiogram 1/18 EF 45-50%, mild mitral regurg no specific wall motion abnormality noted Controlled  Cough-probably bronchitis versus sinusitis and have added Flonase Having cough so follow-up chest x-ray ordered 1/20   DVT prophylaxis: Lovenox Code Status: Full Family Communication: none + Disposition Plan: sdu today--transferred to med surge   Consultants:   Nephrology  Procedures:   None yet  Antimicrobials:   None    Subjective:  Alert pleasant oriented no apparent distress  Eating and drinking  No other issues    Objective: Vitals:   05/17/16 1836 05/17/16 2102 05/18/16 0531 05/18/16 1000  BP: 113/64 121/60 116/61 120/60  Pulse: 75 61 68 66  Resp:  18 16 18   Temp: 98.7 F (37.1 C) 97.5 F (36.4 C) 98.3 F (36.8 C) 98.5 F (36.9 C)  TempSrc: Oral Oral Oral Oral  SpO2: 98% 99% 100% 100%  Weight:      Height:        Intake/Output Summary (Last 24 hours) at 05/18/16 1243 Last data filed at 05/18/16 1000  Gross per 24 hour  Intake  1182 ml  Output                0 ml  Net             1182 ml   Filed Weights   05/15/16 1600 05/15/16 1825 05/16/16 2042  Weight: 58.2 kg (128 lb 4.9 oz) 57.2 kg (126 lb 1.7 oz) 56.7 kg (125 lb)    Examination:  General exam: Awake alert no apparent distress--much more awake alert-throat is clear no submandibular lymphadenopathy no  tonsillar Respiratory system: Clear to auscultation. Coughing at bedside somewhat Cardiovascular system: S1 & S2 heard, RRR. No JVD Gastrointestinal system: Abdomen is nondistended--nontender Central nervous system: Alert and oriented. No focal neurological deficits. Extremities: Symmetric 5 x 5 power.  Skin: No rashes, lesions or ulcers Psychiatry: Judgement and insight are much improved    Data Reviewed: I have personally reviewed following labs and imaging studies  CBC:  Recent Labs Lab 05/14/16 1254 05/14/16 1542 05/14/16 1805 05/15/16 0948  WBC 9.7  --   --  15.3*  NEUTROABS 8.2*  --   --   --   HGB 10.6* 12.2 11.2* 11.4*  HCT 31.8* 36.0 33.0* 32.4*  MCV 77.8*  --   --  74.8*  PLT 170  --   --  737   Basic Metabolic Panel:  Recent Labs Lab 05/15/16 0515 05/15/16 0948 05/15/16 1449 05/15/16 2158 05/17/16 0809  NA 126* 123* 122* 128* 123*  K 3.9 4.5 4.4 3.8 3.7  CL 86* 86* 88* 91* 88*  CO2 18* 18* 18* 19* 20*  GLUCOSE 83 162* 180* 134* 400*  BUN 63* 66* 67* 39* 40*  CREATININE 14.51* 14.80*  14.65* 14.76* 9.34* 10.50*  CALCIUM 8.2* 8.0* 7.8* 7.6* 8.0*  MG 1.6*  --   --   --   --    GFR: Estimated Creatinine Clearance: 5.4 mL/min (by C-G formula based on SCr of 10.5 mg/dL (H)). Liver Function Tests:  Recent Labs Lab 05/14/16 1254  AST 110*  ALT 109*  ALKPHOS 97  BILITOT 0.8  PROT 6.7  ALBUMIN 2.9*   No results for input(s): LIPASE, AMYLASE in the last 168 hours. No results for input(s): AMMONIA in the last 168 hours. Coagulation Profile:  Recent Labs Lab 05/14/16 1519  INR 1.42   Cardiac Enzymes:  Recent Labs Lab 05/14/16 2251 05/15/16 0515 05/15/16 0948 05/15/16 1449 05/15/16 2158  TROPONINI 0.69* 0.91* 1.18* 1.28* 1.16*   BNP (last 3 results) No results for input(s): PROBNP in the last 8760 hours. HbA1C: No results for input(s): HGBA1C in the last 72 hours. CBG:  Recent Labs Lab 05/17/16 1725 05/17/16 2057 05/17/16 2128  05/18/16 0826 05/18/16 1101  GLUCAP 77 48* 215* 496* 455*   Lipid Profile: No results for input(s): CHOL, HDL, LDLCALC, TRIG, CHOLHDL, LDLDIRECT in the last 72 hours. Thyroid Function Tests: No results for input(s): TSH, T4TOTAL, FREET4, T3FREE, THYROIDAB in the last 72 hours. Anemia Panel: No results for input(s): VITAMINB12, FOLATE, FERRITIN, TIBC, IRON, RETICCTPCT in the last 72 hours. Sepsis Labs:  Recent Labs Lab 05/14/16 1314 05/14/16 1529 05/14/16 1805  LATICACIDVEN 4.56* 4.05*  4.05* 3.51*    Recent Results (from the past 240 hour(s))  MRSA PCR Screening     Status: None   Collection Time: 05/14/16  9:24 PM  Result Value Ref Range Status   MRSA by PCR NEGATIVE NEGATIVE Final    Comment:        The GeneXpert MRSA Assay (  FDA approved for NASAL specimens only), is one component of a comprehensive MRSA colonization surveillance program. It is not intended to diagnose MRSA infection nor to guide or monitor treatment for MRSA infections.          Radiology Studies: No results found.      Scheduled Meds: . aspirin  325 mg Oral Daily  . benzonatate  100 mg Oral BID  . calcitRIOL  0.25 mcg Oral BID  . calcium acetate  2,001 mg Oral TID WC  . carvedilol  6.25 mg Oral BID WC  . dialysis solution for CAPD/CCPD (DELFLEX)   Peritoneal Dialysis QID  . dialysis solution 1.5% low-MG/low-CA  2,000 mL Intraperitoneal Q24H  . dialysis solution 2.5% low-MG/low-CA   Intraperitoneal Q24H  . dialysis solution 2.5% low-MG/low-CA   Intraperitoneal Q24H  . dialysis solution 4.25% low-MG/low-CA   Intraperitoneal Q24H  . fluticasone  1 spray Each Nare Daily  . gentamicin cream   Topical Daily  . hydrALAZINE  10 mg Oral TID  . insulin aspart  0-15 Units Subcutaneous TID WC  . insulin aspart  3 Units Subcutaneous TID WC  . insulin glargine  5 Units Subcutaneous Daily  . pantoprazole  40 mg Oral BID   Continuous Infusions:    LOS: 4 days    Time spent:  Linton Hall, [place], MD [facility] Pager [phone]  If 7PM-7AM, please contact night-coverage [email or web address] Password [facility] 05/18/2016, 12:43 PM

## 2016-05-18 NOTE — Progress Notes (Signed)
[facility] ROUNDING NOTE   Subjective:   Interval History:persistent cough   Good ultrafiltration with PD  Will use 4.25 % bag with pause today  Objective:  Vital signs in last 24 hours:  Temp:  [97.5 F (36.4 C)-98.7 F (37.1 C)] 98.3 F (36.8 C) (01/20 0531) Pulse Rate:  [61-75] 68 (01/20 0531) Resp:  [16-18] 16 (01/20 0531) BP: (113-133)/(60-64) 116/61 (01/20 0531) SpO2:  [98 %-100 %] 100 % (01/20 0531)  Weight change:  Filed Weights   05/15/16 1600 05/15/16 1825 05/16/16 2042  Weight: 58.2 kg (128 lb 4.9 oz) 57.2 kg (126 lb 1.7 oz) 56.7 kg (125 lb)    Intake/Output: I/O last 3 completed shifts: In: 1524 [P.O.:1524] Out: 7591 [Other:4050]   Intake/Output this shift:  No intake/output data recorded.  CVS- RRR  Systolic murmurs  RS- CTA diminished ABD- BS present soft non-distended  PD catheter  EXT- no edema   Basic Metabolic Panel:  Recent Labs Lab 05/15/16 0515 05/15/16 0948 05/15/16 1449 05/15/16 2158 05/17/16 0809  NA 126* 123* 122* 128* 123*  K 3.9 4.5 4.4 3.8 3.7  CL 86* 86* 88* 91* 88*  CO2 18* 18* 18* 19* 20*  GLUCOSE 83 162* 180* 134* 400*  BUN 63* 66* 67* 39* 40*  CREATININE 14.51* 14.80*  14.65* 14.76* 9.34* 10.50*  CALCIUM 8.2* 8.0* 7.8* 7.6* 8.0*  MG 1.6*  --   --   --   --     Liver Function Tests:  Recent Labs Lab 05/14/16 1254  AST 110*  ALT 109*  ALKPHOS 97  BILITOT 0.8  PROT 6.7  ALBUMIN 2.9*   No results for input(s): LIPASE, AMYLASE in the last 168 hours. No results for input(s): AMMONIA in the last 168 hours.  CBC:  Recent Labs Lab 05/14/16 1254 05/14/16 1542 05/14/16 1805 05/15/16 0948  WBC 9.7  --   --  15.3*  NEUTROABS 8.2*  --   --   --   HGB 10.6* 12.2 11.2* 11.4*  HCT 31.8* 36.0 33.0* 32.4*  MCV 77.8*  --   --  74.8*  PLT 170  --   --  173    Cardiac Enzymes:  Recent Labs Lab 05/14/16 2251 05/15/16 0515 05/15/16 0948 05/15/16 1449 05/15/16 2158  TROPONINI 0.69* 0.91* 1.18*  1.28* 1.16*    BNP: Invalid input(s): POCBNP  CBG:  Recent Labs Lab 05/17/16 1653 05/17/16 1725 05/17/16 2057 05/17/16 2128 05/18/16 0826  GLUCAP 65 77 48* 215* 496*    Microbiology: Results for orders placed or performed during the hospital encounter of 05/14/16  MRSA PCR Screening     Status: None   Collection Time: 05/14/16  9:24 PM  Result Value Ref Range Status   MRSA by PCR NEGATIVE NEGATIVE Final    Comment:        The GeneXpert MRSA Assay (FDA approved for NASAL specimens only), is one component of a comprehensive MRSA colonization surveillance program. It is not intended to diagnose MRSA infection nor to guide or monitor treatment for MRSA infections.     Coagulation Studies: No results for input(s): LABPROT, INR in the last 72 hours.  Urinalysis: No results for input(s): COLORURINE, LABSPEC, PHURINE, GLUCOSEU, HGBUR, BILIRUBINUR, KETONESUR, PROTEINUR, UROBILINOGEN, NITRITE, LEUKOCYTESUR in the last 72 hours.  Invalid input(s): APPERANCEUR    Imaging: No results found.   Medications:    . aspirin  325 mg Oral Daily  . benzonatate  100 mg Oral BID  . calcitRIOL  0.25 mcg Oral BID  . calcium acetate  2,001 mg Oral TID WC  . carvedilol  6.25 mg Oral BID WC  . dialysis solution for CAPD/CCPD (DELFLEX)   Peritoneal Dialysis QID  . dialysis solution 1.5% low-MG/low-CA  2,000 mL Intraperitoneal Q24H  . dialysis solution 2.5% low-MG/low-CA   Intraperitoneal Q24H  . dialysis solution 2.5% low-MG/low-CA   Intraperitoneal Q24H  . dialysis solution 4.25% low-MG/low-CA   Intraperitoneal Q24H  . fluticasone  1 spray Each Nare Daily  . gentamicin cream   Topical Daily  . hydrALAZINE  10 mg Oral TID  . insulin aspart  0-15 Units Subcutaneous TID WC  . insulin aspart  3 Units Subcutaneous TID WC  . insulin glargine  5 Units Subcutaneous Daily  . pantoprazole  40 mg Oral Daily   sodium chloride, sodium chloride, alteplase, benzonatate, heparin,  hydrALAZINE, HYDROcodone-homatropine, lidocaine (PF), lidocaine-prilocaine, naLOXone (NARCAN)  injection, pentafluoroprop-tetrafluoroeth  Assessment/ Plan:  1. Uncontrolled DM /( bs 878)DKA - per admit team 170 - 121 -109 Much better control  2. ESRD - CCPD  With daytime pause will add 4.25 % bag  3. Hypertension/volume - BP 120/70 doing well  4. Anemia - Hb 11.4  5. Metabolic bone disease - Po vit d / corec Ca 9.1 Phos lo binder , fu ca Maren Reamer Trend 6. HO CM / MVR/ CABG X2 = no chest pain  7. 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 Cough persistent chronic consider nasal steroids and continue ultrafiltration     LOS: 4 Yaslyn Cumby W @TODAY @8 :55 AM

## 2016-05-18 NOTE — Progress Notes (Signed)
Paged Dr. Hollie Salk regarding the BP 108/56 checked at 1:40 pm and not sure if manual exchange should be done.  Dr. Hollie Salk called back and said to hold it.  Earlier this morning Dr. Justin Mend verbally ordered to do manual exchange in the afternoon with 4.25% 2L with dwell time 2.5 hrs.

## 2016-05-19 LAB — RENAL FUNCTION PANEL
ALBUMIN: 1.9 g/dL — AB (ref 3.5–5.0)
ANION GAP: 12 (ref 5–15)
BUN: 36 mg/dL — AB (ref 6–20)
CHLORIDE: 91 mmol/L — AB (ref 101–111)
CO2: 22 mmol/L (ref 22–32)
Calcium: 8.4 mg/dL — ABNORMAL LOW (ref 8.9–10.3)
Creatinine, Ser: 9.78 mg/dL — ABNORMAL HIGH (ref 0.44–1.00)
GFR, EST AFRICAN AMERICAN: 5 mL/min — AB (ref >60)
GFR, EST NON AFRICAN AMERICAN: 4 mL/min — AB (ref >60)
Glucose, Bld: 251 mg/dL — ABNORMAL HIGH (ref 65–99)
PHOSPHORUS: 5.2 mg/dL — AB (ref 2.5–4.6)
POTASSIUM: 3.6 mmol/L (ref 3.5–5.1)
Sodium: 125 mmol/L — ABNORMAL LOW (ref 135–145)

## 2016-05-19 LAB — CBC
HEMATOCRIT: 28.2 % — AB (ref 36.0–46.0)
HEMOGLOBIN: 9.7 g/dL — AB (ref 12.0–15.0)
MCH: 26.4 pg (ref 26.0–34.0)
MCHC: 34.4 g/dL (ref 30.0–36.0)
MCV: 76.6 fL — AB (ref 78.0–100.0)
Platelets: 144 10*3/uL — ABNORMAL LOW (ref 150–400)
RBC: 3.68 MIL/uL — ABNORMAL LOW (ref 3.87–5.11)
RDW: 16 % — ABNORMAL HIGH (ref 11.5–15.5)
WBC: 9.5 10*3/uL (ref 4.0–10.5)

## 2016-05-19 LAB — GLUCOSE, CAPILLARY
Glucose-Capillary: 305 mg/dL — ABNORMAL HIGH (ref 65–99)
Glucose-Capillary: 162 mg/dL — ABNORMAL HIGH (ref 65–99)
Glucose-Capillary: 60 mg/dL — ABNORMAL LOW (ref 65–99)

## 2016-05-19 MED ORDER — ALBUTEROL SULFATE (2.5 MG/3ML) 0.083% IN NEBU
2.5000 mg | INHALATION_SOLUTION | Freq: Two times a day (BID) | RESPIRATORY_TRACT | Status: DC
  Administered 2016-05-19 – 2016-05-21 (×4): 2.5 mg via RESPIRATORY_TRACT
  Filled 2016-05-19 (×4): qty 3

## 2016-05-19 MED ORDER — ALBUTEROL SULFATE (2.5 MG/3ML) 0.083% IN NEBU
2.5000 mg | INHALATION_SOLUTION | RESPIRATORY_TRACT | Status: DC
  Administered 2016-05-19: 2.5 mg via RESPIRATORY_TRACT
  Filled 2016-05-19: qty 3

## 2016-05-19 MED ORDER — ALBUTEROL SULFATE (2.5 MG/3ML) 0.083% IN NEBU: 2.5000 mg | INHALATION_SOLUTION | Freq: Four times a day (QID) | RESPIRATORY_TRACT | Status: DC | PRN

## 2016-05-19 NOTE — Progress Notes (Signed)
[facility] ROUNDING NOTE   Subjective:   Interval History:   Continues to do well on cycler. Persistent cough has been patients biggest complaint. Was unable to provide additional dialysis yesterday afternoon due to hypotension.    Objective:  Vital signs in last 24 hours:  Temp:  [97.4 F (36.3 C)-98.7 F (37.1 C)] 98.2 F (36.8 C) (01/21 0929) Pulse Rate:  [62-66] 65 (01/21 0929) Resp:  [18] 18 (01/21 0929) BP: (99-120)/(54-60) 114/60 (01/21 0929) SpO2:  [100 %] 100 % (01/21 0929)  Weight change:  Filed Weights   05/15/16 1600 05/15/16 1825 05/16/16 2042  Weight: 58.2 kg (128 lb 4.9 oz) 57.2 kg (126 lb 1.7 oz) 56.7 kg (125 lb)    Intake/Output: I/O last 3 completed shifts: In: 28315 [P.O.:702; VVOHY:0737] Out: 10808 [TGGYI:94854]   Intake/Output this shift:  No intake/output data recorded.  CVS- RRR Systolic murmurs  RS- CTA diminished ABD- BS present soft non-distended PD catheter  EXT- no edema   Basic Metabolic Panel:  Recent Labs Lab 05/15/16 0515 05/15/16 0948 05/15/16 1449 05/15/16 2158 05/17/16 0809 05/19/16 0404  NA 126* 123* 122* 128* 123* 125*  K 3.9 4.5 4.4 3.8 3.7 3.6  CL 86* 86* 88* 91* 88* 91*  CO2 18* 18* 18* 19* 20* 22  GLUCOSE 83 162* 180* 134* 400* 251*  BUN 63* 66* 67* 39* 40* 36*  CREATININE 14.51* 14.80*  14.65* 14.76* 9.34* 10.50* 9.78*  CALCIUM 8.2* 8.0* 7.8* 7.6* 8.0* 8.4*  MG 1.6*  --   --   --   --   --   PHOS  --   --   --   --   --  5.2*    Liver Function Tests:  Recent Labs Lab 05/14/16 1254 05/19/16 0404  AST 110*  --   ALT 109*  --   ALKPHOS 97  --   BILITOT 0.8  --   PROT 6.7  --   ALBUMIN 2.9* 1.9*   No results for input(s): LIPASE, AMYLASE in the last 168 hours. No results for input(s): AMMONIA in the last 168 hours.  CBC:  Recent Labs Lab 05/14/16 1254 05/14/16 1542 05/14/16 1805 05/15/16 0948 05/19/16 0404  WBC 9.7  --   --  15.3* 9.5  NEUTROABS 8.2*  --   --   --   --    HGB 10.6* 12.2 11.2* 11.4* 9.7*  HCT 31.8* 36.0 33.0* 32.4* 28.2*  MCV 77.8*  --   --  74.8* 76.6*  PLT 170  --   --  173 144*    Cardiac Enzymes:  Recent Labs Lab 05/14/16 2251 05/15/16 0515 05/15/16 0948 05/15/16 1449 05/15/16 2158  TROPONINI 0.69* 0.91* 1.18* 1.28* 1.16*    BNP: Invalid input(s): POCBNP  CBG:  Recent Labs Lab 05/18/16 1658 05/18/16 1758 05/18/16 1927 05/18/16 2148 05/19/16 0818  GLUCAP 93 45* 48 141* 305*    Microbiology: Results for orders placed or performed during the hospital encounter of 05/14/16  MRSA PCR Screening     Status: None   Collection Time: 05/14/16  9:24 PM  Result Value Ref Range Status   MRSA by PCR NEGATIVE NEGATIVE Final    Comment:        The GeneXpert MRSA Assay (FDA approved for NASAL specimens only), is one component of a comprehensive MRSA colonization surveillance program. It is not intended to diagnose MRSA infection nor to guide or monitor treatment for MRSA infections.     Coagulation Studies:  No results for input(s): LABPROT, INR in the last 72 hours.  Urinalysis: No results for input(s): COLORURINE, LABSPEC, PHURINE, GLUCOSEU, HGBUR, BILIRUBINUR, KETONESUR, PROTEINUR, UROBILINOGEN, NITRITE, LEUKOCYTESUR in the last 72 hours.  Invalid input(s): APPERANCEUR    Imaging: Dg Chest 2 View  Result Date: 05/18/2016 CLINICAL DATA:  Elevated blood sugar.  Cough. EXAM: CHEST  2 VIEW COMPARISON:  May 15, 2016 FINDINGS: Stable cardiomediastinal silhouette. Effusion and opacity in left base is stable. Mild interstitial prominence diffusely on the previous study has improved. No other interval changes. IMPRESSION: Improved interstitial opacities with stable left effusion and underlying opacity. Electronically Signed   By: Dorise Bullion III M.D   On: 05/18/2016 15:44     Medications:    . aspirin  325 mg Oral Daily  . benzonatate  100 mg Oral BID  . calcitRIOL  0.25 mcg Oral BID  . calcium acetate   2,001 mg Oral TID WC  . carvedilol  6.25 mg Oral BID WC  . dialysis solution for CAPD/CCPD (DELFLEX)   Peritoneal Dialysis QID  . dialysis solution 1.5% low-MG/low-CA  2,000 mL Intraperitoneal Q24H  . dialysis solution 2.5% low-MG/low-CA   Intraperitoneal Q24H  . dialysis solution 2.5% low-MG/low-CA   Intraperitoneal Q24H  . dialysis solution 4.25% low-MG/low-CA   Intraperitoneal Q24H  . fluticasone  1 spray Each Nare Daily  . gentamicin cream   Topical Daily  . hydrALAZINE  10 mg Oral TID  . insulin aspart  0-15 Units Subcutaneous TID WC  . insulin aspart  3 Units Subcutaneous TID WC  . insulin glargine  5 Units Subcutaneous Daily  . pantoprazole  40 mg Oral BID   sodium chloride, sodium chloride, alteplase, benzonatate, heparin, hydrALAZINE, HYDROcodone-homatropine, lidocaine (PF), lidocaine-prilocaine, naLOXone (NARCAN)  injection, pentafluoroprop-tetrafluoroeth  Assessment/ Plan:   Admitted with hyperglycemia and DKA underwent 1 hemodialysis treatment and has transitioned to PD cycler.  Persistent non productive cough has been largest complaint  Kidney Transplant and Rejection no longer taking antirejection medications  Diabetes type 1  Per hospitalist  Toxic metabolic Encephalopathy has resolved   Prior CABG + mitral valve repair 07/18/15  Last Echo showed systolic dysfunction EF 45 - 50 %  CXR  Some Left effussion     Cough  Added nasal steroids and suggest inhaler. We may need to transition to hemodialysis if coughing continues to become difficult to control       LOS: 5 Austin Herd W @TODAY @9 :50 AM

## 2016-05-19 NOTE — Progress Notes (Signed)
PROGRESS NOTE    Priscilla [name]  LOV:[id number] DOB: [birth year] DOA: 05/14/2016 PCP: [name], MD    Brief Narrative:   46 y/o ? 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 Joelyn Oms Prior cocaine Sickle cell trait htn History of CABG as well as mitral valve repair 88/41/6606 complicated by hypotension chronic diarrhea-supposed to have colonoscopy  Admitted with sugar 878 and DKA, potassium 5.1 and hypertensive and confused Troponin 0.53 Initial anion gap was 18, sodium 118, BUN/creatinine 58/15.7 Lactic acid 4.5    Assessment & Plan:   Active Problems:   Kidney transplant (RLQ) failure and rejection   ESRD (end stage renal disease) (HCC)   Type 1 diabetes mellitus with ketoacidosis without coma (HCC)   S/P CABG x 2   DKA (diabetic ketoacidoses) (HCC)   Microcytic anemia   Hyponatremia   Transaminitis   Difficult intravenous access   Elevation of cardiac enzymes   Toxic metabolic encephalopathy-secondary to DKA, uremia Secondary to uremia-completely resolved after half treatment dialysis holding OxyIR 5-10 every 6 when necessary number Remeron 15 at bedtime, gabapentin 100 daily, Phenergan 12.5 every 6 when necessary Mentation much improved  diabetes mellitus type II DKA/hyponatremia from osmotic diuresis-anion gap is still 22--dialysis should help with renal component of this  acidosis is currently renal origin transition to 4 times a day before meals at bedtime diabetic diet Blood sugar labile 05/18/16 to 40 and 60-now 140-305 Lowered her Lantus from 10 to 5 units 05/18/2016, careful titration of the same  ESRD on CPPD-will need peritoneal dialysis Metabolic bone disease as per nephrology  Anemia of renal disease hemoglobin reasonable 11  check labs a.m.  Prior CABG + mitral valve repair 07/18/15 Rising troponin--no overt chest pain but poor exam cardiology  was consulted at 1/18 and feel like this is secondary to renal disease elevation in troponin as patient does not have any chest pain no further workup need outpatient work-up with cath - as not having active chest pain currently and troponin flat trend was flat during initial hospital stay  Hypertension-poorly controlled 2/2  lack of dialysis. Very much better controlled  Acute decompensated diastolic heart failure-last echo EF 55 Echocardiogram 1/18 EF 45-50%, mild mitral regurg no specific wall motion abnormality noted Controlled  Cough-probably bronchitis versus sinusitis and have added Flonase cxr 1/20 shows persisiting pleural effusions so deciding re: IHD vs peritoneal She is on standard therapy for URI/Reactive airway dx and have added albuterol to see how she does on 1/21 through 1/22   DVT prophylaxis: Lovenox Code Status: Full Family Communication: none + Disposition Plan: sdu today--transferred to med surge--await decision re: need for HD and check cough am 1/22   Consultants:   Nephrology  Procedures:   None yet  Antimicrobials:   None    Subjective:  Coughing Sore throat-nothing really helping No cp No fever no sinus pain no congestion   Objective: Vitals:   05/18/16 2150 05/19/16 0622 05/19/16 0839 05/19/16 0929  BP:  (!) 105/57 (!) 108/54 114/60  Pulse: 62 63  65  Resp: 18   18  Temp: 97.4 F (36.3 C) 98.7 F (37.1 C)  98.2 F (36.8 C)  TempSrc: Oral Oral  Oral  SpO2: 100%   100%  Weight:      Height:        Intake/Output Summary (Last 24 hours) at 05/19/16 1112 Last data filed at 05/19/16 0700  Gross per 24 hour  Intake  10063 ml  Output            10808 ml  Net             -745 ml   Filed Weights   05/15/16 1600 05/15/16 1825 05/16/16 2042  Weight: 58.2 kg (128 lb 4.9 oz) 57.2 kg (126 lb 1.7 oz) 56.7 kg (125 lb)    Examination:  General exam: Awake alert no apparent distress--much more awake alert-throat is clear    Respiratory system: Clear to auscultation. Coughing at bedside ++ Cardiovascular system: S1 & S2 heard, RRR. No JVD Gastrointestinal system: Abdomen is nondistended--nontender Central nervous system: Alert and oriented. No focal neurological deficits. Extremities: Symmetric 5 x 5 power.  Skin: No rashes, lesions or ulcers Psychiatry: Judgement and insight are much improved    Data Reviewed: I have personally reviewed following labs and imaging studies  CBC:  Recent Labs Lab 05/14/16 1254 05/14/16 1542 05/14/16 1805 05/15/16 0948 05/19/16 0404  WBC 9.7  --   --  15.3* 9.5  NEUTROABS 8.2*  --   --   --   --   HGB 10.6* 12.2 11.2* 11.4* 9.7*  HCT 31.8* 36.0 33.0* 32.4* 28.2*  MCV 77.8*  --   --  74.8* 76.6*  PLT 170  --   --  173 696*   Basic Metabolic Panel:  Recent Labs Lab 05/15/16 0515 05/15/16 0948 05/15/16 1449 05/15/16 2158 05/17/16 0809 05/19/16 0404  NA 126* 123* 122* 128* 123* 125*  K 3.9 4.5 4.4 3.8 3.7 3.6  CL 86* 86* 88* 91* 88* 91*  CO2 18* 18* 18* 19* 20* 22  GLUCOSE 83 162* 180* 134* 400* 251*  BUN 63* 66* 67* 39* 40* 36*  CREATININE 14.51* 14.80*  14.65* 14.76* 9.34* 10.50* 9.78*  CALCIUM 8.2* 8.0* 7.8* 7.6* 8.0* 8.4*  MG 1.6*  --   --   --   --   --   PHOS  --   --   --   --   --  5.2*   GFR: Estimated Creatinine Clearance: 5.8 mL/min (by C-G formula based on SCr of 9.78 mg/dL (H)). Liver Function Tests:  Recent Labs Lab 05/14/16 1254 05/19/16 0404  AST 110*  --   ALT 109*  --   ALKPHOS 97  --   BILITOT 0.8  --   PROT 6.7  --   ALBUMIN 2.9* 1.9*   No results for input(s): LIPASE, AMYLASE in the last 168 hours. No results for input(s): AMMONIA in the last 168 hours. Coagulation Profile:  Recent Labs Lab 05/14/16 1519  INR 1.42   Cardiac Enzymes:  Recent Labs Lab 05/14/16 2251 05/15/16 0515 05/15/16 0948 05/15/16 1449 05/15/16 2158  TROPONINI 0.69* 0.91* 1.18* 1.28* 1.16*   BNP (last 3 results) No results for  input(s): PROBNP in the last 8760 hours. HbA1C: No results for input(s): HGBA1C in the last 72 hours. CBG:  Recent Labs Lab 05/18/16 1658 05/18/16 1758 05/18/16 1927 05/18/16 2148 05/19/16 0818  GLUCAP 93 53* 77 141* 305*   Lipid Profile: No results for input(s): CHOL, HDL, LDLCALC, TRIG, CHOLHDL, LDLDIRECT in the last 72 hours. Thyroid Function Tests: No results for input(s): TSH, T4TOTAL, FREET4, T3FREE, THYROIDAB in the last 72 hours. Anemia Panel: No results for input(s): VITAMINB12, FOLATE, FERRITIN, TIBC, IRON, RETICCTPCT in the last 72 hours. Sepsis Labs:  Recent Labs Lab 05/14/16 1314 05/14/16 1529 05/14/16 1805  LATICACIDVEN 4.56* 4.05*  4.05* 3.51*    Recent Results (  from the past 240 hour(s))  MRSA PCR Screening     Status: None   Collection Time: 05/14/16  9:24 PM  Result Value Ref Range Status   MRSA by PCR NEGATIVE NEGATIVE Final    Comment:        The GeneXpert MRSA Assay (FDA approved for NASAL specimens only), is one component of a comprehensive MRSA colonization surveillance program. It is not intended to diagnose MRSA infection nor to guide or monitor treatment for MRSA infections.          Radiology Studies: Dg Chest 2 View  Result Date: 05/18/2016 CLINICAL DATA:  Elevated blood sugar.  Cough. EXAM: CHEST  2 VIEW COMPARISON:  May 15, 2016 FINDINGS: Stable cardiomediastinal silhouette. Effusion and opacity in left base is stable. Mild interstitial prominence diffusely on the previous study has improved. No other interval changes. IMPRESSION: Improved interstitial opacities with stable left effusion and underlying opacity. Electronically Signed   By: Dorise Bullion III M.D   On: 05/18/2016 15:44        Scheduled Meds: . albuterol  2.5 mg Nebulization Q4H  . aspirin  325 mg Oral Daily  . benzonatate  100 mg Oral BID  . calcitRIOL  0.25 mcg Oral BID  . calcium acetate  2,001 mg Oral TID WC  . carvedilol  6.25 mg Oral BID WC  .  dialysis solution for CAPD/CCPD (DELFLEX)   Peritoneal Dialysis QID  . dialysis solution 1.5% low-MG/low-CA  2,000 mL Intraperitoneal Q24H  . dialysis solution 2.5% low-MG/low-CA   Intraperitoneal Q24H  . dialysis solution 2.5% low-MG/low-CA   Intraperitoneal Q24H  . dialysis solution 4.25% low-MG/low-CA   Intraperitoneal Q24H  . fluticasone  1 spray Each Nare Daily  . gentamicin cream   Topical Daily  . hydrALAZINE  10 mg Oral TID  . insulin aspart  0-15 Units Subcutaneous TID WC  . insulin aspart  3 Units Subcutaneous TID WC  . insulin glargine  5 Units Subcutaneous Daily  . pantoprazole  40 mg Oral BID   Continuous Infusions:    LOS: 5 days    Time spent: [place], [place], MD [facility] Pager [phone]  If 7PM-7AM, please contact night-coverage [email or web address] Password [email or web address] 05/19/2016, 11:12 AM

## 2016-05-19 NOTE — Progress Notes (Signed)
Could get no reading on pulse ox.  Dinomat reading was earlier and within parameters for pt.  Rt will monitor.

## 2016-05-20 LAB — GLUCOSE, CAPILLARY
GLUCOSE-CAPILLARY: 55 mg/dL — AB (ref 65–99)
GLUCOSE-CAPILLARY: 88 mg/dL (ref 65–99)
GLUCOSE-CAPILLARY: 152 mg/dL — AB (ref 65–99)
GLUCOSE-CAPILLARY: 315 mg/dL — AB (ref 65–99)
Glucose-Capillary: 214 mg/dL — ABNORMAL HIGH (ref 65–99)
Glucose-Capillary: 218 mg/dL — ABNORMAL HIGH (ref 65–99)
Glucose-Capillary: 96 mg/dL (ref 65–99)

## 2016-05-20 MED ORDER — PRO-STAT SUGAR FREE PO LIQD
30.0000 mL | Freq: Two times a day (BID) | ORAL | Status: DC
  Filled 2016-05-20 (×2): qty 30

## 2016-05-20 MED ORDER — RENA-VITE PO TABS
1.0000 | ORAL_TABLET | Freq: Every day | ORAL | Status: DC
  Administered 2016-05-20: 1 via ORAL
  Filled 2016-05-20: qty 1

## 2016-05-20 MED ORDER — DARBEPOETIN ALFA 60 MCG/0.3ML IJ SOSY
60.0000 ug | PREFILLED_SYRINGE | INTRAMUSCULAR | Status: DC
  Filled 2016-05-20: qty 0.3

## 2016-05-20 MED ORDER — ZOLPIDEM TARTRATE 5 MG PO TABS
5.0000 mg | ORAL_TABLET | Freq: Every evening | ORAL | Status: DC | PRN
  Administered 2016-05-20: 5 mg via ORAL
  Filled 2016-05-20: qty 1

## 2016-05-20 NOTE — Progress Notes (Signed)
Results for TRINIDAD, INGLE (MRN [id number]) as of 05/20/2016 14:11  Ref. Range 05/19/2016 17:44 05/19/2016 18:36 05/19/2016 21:17 05/20/2016 08:07 05/20/2016 12:01  Glucose-Capillary Latest Ref Range: 65 - 99 mg/dL 60 (L) 88 152 (H) 214 (H) 315 (H)  Noted that blood sugars are elevated today. Recommend increasing Lantus to 8 units daily and continuing same correction scale and Novolog meal coverage. Titrate dosages as needed. Harvel Ricks RN BSN CDE

## 2016-05-20 NOTE — Progress Notes (Signed)
PROGRESS NOTE    Priscilla [name]  ZHG:[id number] DOB: 10-Dec-1970 DOA: 05/14/2016 PCP: [name], MD    Brief Narrative:   46 y/o ? 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 Joelyn Oms Prior cocaine Sickle cell trait htn History of CABG as well as mitral valve repair 19/62/2297 complicated by hypotension chronic diarrhea-supposed to have colonoscopy  Admitted with sugar 878 and DKA, potassium 5.1 and hypertensive and confused Troponin 0.53 Initial anion gap was 18, sodium 118, BUN/creatinine 58/15.7 Lactic acid 4.5    Assessment & Plan:   Active Problems:   Kidney transplant (RLQ) failure and rejection   ESRD (end stage renal disease) (HCC)   Type 1 diabetes mellitus with ketoacidosis without coma (HCC)   S/P CABG x 2   DKA (diabetic ketoacidoses) (HCC)   Microcytic anemia   Hyponatremia   Transaminitis   Difficult intravenous access   Elevation of cardiac enzymes   Toxic metabolic encephalopathy-secondary to DKA, uremia Secondary to uremia-completely resolved after half treatment dialysis holding OxyIR 5-10 every 6 when necessary number Remeron 15 at bedtime, gabapentin 100 daily, Phenergan 12.5 every 6 when necessary Mentation much improved  diabetes mellitus type II DKA/hyponatremia from osmotic diuresis-anion gap 12 transition to 4 times a day before meals at bedtime diabetic diet Blood sugar labile 05/18/16 ---now 214-316 Lowered her Lantus from 10 to 5 units 05/18/2016, careful titration of the same  ESRD on CPPD-will need peritoneal dialysis Metabolic bone disease as per nephrology  Anemia of renal disease hemoglobin reasonable 11  check labs a.m.  Prior CABG + mitral valve repair 07/18/15 Rising troponin--no overt chest pain but poor exam cardiology was consulted at 1/18 and feel like this is secondary to renal disease elevation in troponin as patient does  not have any chest pain no further workup need outpatient work-up with cath [pre-requisite for transplant Reconsideration] - as not having active chest pain currently and troponin flat trend was flat during initial hospital stay  Hypertension-poorly controlled 2/2  lack of dialysis. Very much better controlled  Acute decompensated diastolic heart failure-last echo EF 55 Echocardiogram 1/18 EF 45-50%, mild mitral regurg no specific wall motion abnormality noted Controlled  Cough-probably bronchitis versus sinusitis and have added Flonase cxr 1/20 shows persisiting pleural effusions so deciding re: IHD vs peritoneal She is on standard therapy for URI/Reactive airway dx and have added albuterol to see how she does on 1/21 through 1/22   DVT prophylaxis: Lovenox Code Status: Full Family Communication: none + Disposition Plan: sdu today--transferred to med surge--await decision re: need for HD and check cough am 1/22   Consultants:   Nephrology  Procedures:   None yet  Antimicrobials:   None    Subjective:  Coughing still No other issues other than that eating   Objective: Vitals:   05/19/16 1540 05/19/16 2118 05/20/16 0504 05/20/16 0918  BP: (!) 101/56 (!) 107/48 (!) 103/49 122/61  Pulse: 62 70 66 66  Resp: 17 18 20 18   Temp: 97.9 F (36.6 C) 98.2 F (36.8 C) 98.2 F (36.8 C) 98.5 F (36.9 C)  TempSrc: Oral Oral Oral Oral  SpO2: 98% 99% 100% 100%  Weight:  56.2 kg (123 lb 14.4 oz)    Height:        Intake/Output Summary (Last 24 hours) at 05/20/16 1248 Last data filed at 05/20/16 0919  Gross per 24 hour  Intake  10935 ml  Output            10744 ml  Net              191 ml   Filed Weights   05/15/16 1825 05/16/16 2042 05/19/16 2118  Weight: 57.2 kg (126 lb 1.7 oz) 56.7 kg (125 lb) 56.2 kg (123 lb 14.4 oz)    Examination:  General exam:much more awake alert-throat is clear  Respiratory system: Clear  Coughing at bedside ++ Cardiovascular  system: S1 & S2 heard, RRR. No JVD Gastrointestinal system: Abdomen is nondistended--nontender Central nervous system: Alert and oriented. No focal neurological deficits. Extremities: Symmetric 5 x 5 power.  Skin: No rashes, lesions or ulcers Psychiatry: Judgement and insight are much improved    Data Reviewed: I have personally reviewed following labs and imaging studies  CBC:  Recent Labs Lab 05/14/16 1254 05/14/16 1542 05/14/16 1805 05/15/16 0948 05/19/16 0404  WBC 9.7  --   --  15.3* 9.5  NEUTROABS 8.2*  --   --   --   --   HGB 10.6* 12.2 11.2* 11.4* 9.7*  HCT 31.8* 36.0 33.0* 32.4* 28.2*  MCV 77.8*  --   --  74.8* 76.6*  PLT 170  --   --  173 680*   Basic Metabolic Panel:  Recent Labs Lab 05/15/16 0515 05/15/16 0948 05/15/16 1449 05/15/16 2158 05/17/16 0809 05/19/16 0404  NA 126* 123* 122* 128* 123* 125*  K 3.9 4.5 4.4 3.8 3.7 3.6  CL 86* 86* 88* 91* 88* 91*  CO2 18* 18* 18* 19* 20* 22  GLUCOSE 83 162* 180* 134* 400* 251*  BUN 63* 66* 67* 39* 40* 36*  CREATININE 14.51* 14.80*  14.65* 14.76* 9.34* 10.50* 9.78*  CALCIUM 8.2* 8.0* 7.8* 7.6* 8.0* 8.4*  MG 1.6*  --   --   --   --   --   PHOS  --   --   --   --   --  5.2*   GFR: Estimated Creatinine Clearance: 5.8 mL/min (by C-G formula based on SCr of 9.78 mg/dL (H)). Liver Function Tests:  Recent Labs Lab 05/14/16 1254 05/19/16 0404  AST 110*  --   ALT 109*  --   ALKPHOS 97  --   BILITOT 0.8  --   PROT 6.7  --   ALBUMIN 2.9* 1.9*   No results for input(s): LIPASE, AMYLASE in the last 168 hours. No results for input(s): AMMONIA in the last 168 hours. Coagulation Profile:  Recent Labs Lab 05/14/16 1519  INR 1.42   Cardiac Enzymes:  Recent Labs Lab 05/14/16 2251 05/15/16 0515 05/15/16 0948 05/15/16 1449 05/15/16 2158  TROPONINI 0.69* 0.91* 1.18* 1.28* 1.16*   BNP (last 3 results) No results for input(s): PROBNP in the last 8760 hours. HbA1C: No results for input(s): HGBA1C in the  last 72 hours. CBG:  Recent Labs Lab 05/19/16 1744 05/19/16 1836 05/19/16 2117 05/20/16 0807 05/20/16 1201  GLUCAP 60* 88 152* 214* 315*   Lipid Profile: No results for input(s): CHOL, HDL, LDLCALC, TRIG, CHOLHDL, LDLDIRECT in the last 72 hours. Thyroid Function Tests: No results for input(s): TSH, T4TOTAL, FREET4, T3FREE, THYROIDAB in the last 72 hours. Anemia Panel: No results for input(s): VITAMINB12, FOLATE, FERRITIN, TIBC, IRON, RETICCTPCT in the last 72 hours. Sepsis Labs:  Recent Labs Lab 05/14/16 1314 05/14/16 1529 05/14/16 1805  LATICACIDVEN 4.56* 4.05*  4.05* 3.51*    Recent Results (from the past 240 hour(s))  MRSA PCR Screening     Status: None   Collection Time: 05/14/16  9:24 PM  Result Value Ref Range Status   MRSA by PCR NEGATIVE NEGATIVE Final    Comment:        The GeneXpert MRSA Assay (FDA approved for NASAL specimens only), is one component of a comprehensive MRSA colonization surveillance program. It is not intended to diagnose MRSA infection nor to guide or monitor treatment for MRSA infections.          Radiology Studies: Dg Chest 2 View  Result Date: 05/18/2016 CLINICAL DATA:  Elevated blood sugar.  Cough. EXAM: CHEST  2 VIEW COMPARISON:  May 15, 2016 FINDINGS: Stable cardiomediastinal silhouette. Effusion and opacity in left base is stable. Mild interstitial prominence diffusely on the previous study has improved. No other interval changes. IMPRESSION: Improved interstitial opacities with stable left effusion and underlying opacity. Electronically Signed   By: Dorise Bullion III M.D   On: 05/18/2016 15:44        Scheduled Meds: . albuterol  2.5 mg Nebulization BID  . aspirin  325 mg Oral Daily  . benzonatate  100 mg Oral BID  . calcitRIOL  0.25 mcg Oral BID  . calcium acetate  2,001 mg Oral TID WC  . carvedilol  6.25 mg Oral BID WC  . [START ON 05/21/2016] darbepoetin (ARANESP) injection - NON-DIALYSIS  60 mcg  Subcutaneous Q Tue-1800  . dialysis solution for CAPD/CCPD [facility])   Peritoneal Dialysis QID  . dialysis solution 1.5% low-MG/low-CA  2,000 mL Intraperitoneal Q24H  . dialysis solution 2.5% low-MG/low-CA   Intraperitoneal Q24H  . dialysis solution 2.5% low-MG/low-CA   Intraperitoneal Q24H  . dialysis solution 4.25% low-MG/low-CA   Intraperitoneal Q24H  . feeding supplement (PRO-STAT SUGAR FREE 64)  30 mL Oral BID  . fluticasone  1 spray Each Nare Daily  . gentamicin cream   Topical Daily  . hydrALAZINE  10 mg Oral TID  . insulin aspart  0-15 Units Subcutaneous TID WC  . insulin aspart  3 Units Subcutaneous TID WC  . insulin glargine  5 Units Subcutaneous Daily  . multivitamin  1 tablet Oral QHS  . pantoprazole  40 mg Oral BID   Continuous Infusions:    LOS: 6 days    Time spent: [place], [place], MD [facility] Pager [phone]  If 7PM-7AM, please contact night-coverage [email or web address] Password [facility] 05/20/2016, 12:48 PM

## 2016-05-20 NOTE — Progress Notes (Signed)
[facility] Progress Note  Dialysis Orders: CCPD   Assessment/Plan: 1. DKA - per admit anion gap now closed  2. Cough / hoarseness - has diffuse rales on exam and IS pattern on CXR. Clinical picture most c/w URI/ viral illness. Ordered swab for resp panel.  3. ESRD - Had 1 HD on 1/18 then transitioned back to CCPD  - spoke with primary nephrologist Dr. Joelyn Oms - ok to return to PD as outpatient if she commits to compliance with rx  4. Anemia - Hgb 9.7 trending down - give 60 mcg Aranesp  5. Metabolic bone disease - PO Vita D/PhosLo binder  6. HTN/volume - BP controlled/now hypotensive readings  7. Nutrition - PCM Alb 1.9 - appetite improving/ add vitamins/protein supp 8. H/o failed kidney and pancreas tx - no immunosupp meds  Lynnda Child PA-C [facility] Pager [phone] 05/20/2016,11:01 AM  LOS: 6 days   Pt seen, examined and agree w A/P as above.  Kelly Splinter MD [facility] pager [phone]   05/20/2016, 4:28 PM    Subjective:  Alert Ox3 , Still coughing, had breathing treatment yesterday with minimal improvement.   Objective Vitals:   05/19/16 1540 05/19/16 2118 05/20/16 0504 05/20/16 0918  BP: (!) 101/56 (!) 107/48 (!) 103/49 122/61  Pulse: 62 70 66 66  Resp: 17 18 20 18   Temp: 97.9 F (36.6 C) 98.2 F (36.8 C) 98.2 F (36.8 C) 98.5 F (36.9 C)  TempSrc: Oral Oral Oral Oral  SpO2: 98% 99% 100% 100%  Weight:  56.2 kg (123 lb 14.4 oz)    Height:       Physical Exam General: WNWD female NAD Heart: RRR  Lungs: occ crackles at bases  coarse rhochi Abdomen: soft Bs+ PD catheter dressed Extremities: no LE edema  Dialysis Access: PD cath   Additional Objective Labs: Basic Metabolic Panel:  Recent Labs Lab 05/15/16 2158 05/17/16 0809 05/19/16 0404  NA 128* 123* 125*  K 3.8 3.7 3.6  CL 91* 88* 91*  CO2 19* 20* 22  GLUCOSE 134* 400* 251*  BUN 39* 40* 36*  CREATININE 9.34* 10.50* 9.78*  CALCIUM 7.6*  8.0* 8.4*  PHOS  --   --  5.2*   Liver Function Tests:  Recent Labs Lab 05/14/16 1254 05/19/16 0404  AST 110*  --   ALT 109*  --   ALKPHOS 97  --   BILITOT 0.8  --   PROT 6.7  --   ALBUMIN 2.9* 1.9*   No results for input(s): LIPASE, AMYLASE in the last 168 hours. CBC:  Recent Labs Lab 05/14/16 1254  05/14/16 1805 05/15/16 0948 05/19/16 0404  WBC 9.7  --   --  15.3* 9.5  NEUTROABS 8.2*  --   --   --   --   HGB 10.6*  < > 11.2* 11.4* 9.7*  HCT 31.8*  < > 33.0* 32.4* 28.2*  MCV 77.8*  --   --  74.8* 76.6*  PLT 170  --   --  173 144*  < > = values in this interval not displayed. Blood Culture    Component Value Date/Time   SDES URINE, CLEAN CATCH 09/24/2012 2007   SPECREQUEST NONE 09/24/2012 2007   CULT YEAST 09/24/2012 2007   REPTSTATUS 09/26/2012 FINAL 09/24/2012 2007    Cardiac Enzymes:  Recent Labs Lab 05/14/16 2251 05/15/16 0515 05/15/16 0948 05/15/16 1449 05/15/16 2158  TROPONINI 0.69* 0.91* 1.18* 1.28* 1.16*   CBG:  Recent Labs  Lab 05/19/16 1659 05/19/16 1744 05/19/16 1836 05/19/16 2117 05/20/16 0807  GLUCAP 55* 60* 88 152* 214*   Iron Studies: No results for input(s): IRON, TIBC, TRANSFERRIN, FERRITIN in the last 72 hours. Lab Results  Component Value Date   INR 1.42 05/14/2016   INR 1.65 (H) 07/18/2014   INR 1.09 07/14/2014   Medications:  . albuterol  2.5 mg Nebulization BID  . aspirin  325 mg Oral Daily  . benzonatate  100 mg Oral BID  . calcitRIOL  0.25 mcg Oral BID  . calcium acetate  2,001 mg Oral TID WC  . carvedilol  6.25 mg Oral BID WC  . dialysis solution for CAPD/CCPD (DELFLEX)   Peritoneal Dialysis QID  . dialysis solution 1.5% low-MG/low-CA  2,000 mL Intraperitoneal Q24H  . dialysis solution 2.5% low-MG/low-CA   Intraperitoneal Q24H  . dialysis solution 2.5% low-MG/low-CA   Intraperitoneal Q24H  . dialysis solution 4.25% low-MG/low-CA   Intraperitoneal Q24H  . fluticasone  1 spray Each Nare Daily  . gentamicin cream    Topical Daily  . hydrALAZINE  10 mg Oral TID  . insulin aspart  0-15 Units Subcutaneous TID WC  . insulin aspart  3 Units Subcutaneous TID WC  . insulin glargine  5 Units Subcutaneous Daily  . pantoprazole  40 mg Oral BID

## 2016-05-20 NOTE — Progress Notes (Signed)
Hemodialysis Education Videos showed to the patient.

## 2016-05-21 LAB — RESPIRATORY PANEL BY PCR
ADENOVIRUS-RVPPCR: NOT DETECTED
Bordetella pertussis: NOT DETECTED
CORONAVIRUS 229E-RVPPCR: NOT DETECTED
CORONAVIRUS OC43-RVPPCR: NOT DETECTED
Chlamydophila pneumoniae: NOT DETECTED
Coronavirus HKU1: NOT DETECTED
Coronavirus NL63: NOT DETECTED
INFLUENZA B-RVPPCR: NOT DETECTED
Influenza A: NOT DETECTED
MYCOPLASMA PNEUMONIAE-RVPPCR: NOT DETECTED
Metapneumovirus: NOT DETECTED
PARAINFLUENZA VIRUS 1-RVPPCR: NOT DETECTED
Parainfluenza Virus 2: NOT DETECTED
Parainfluenza Virus 3: NOT DETECTED
Parainfluenza Virus 4: NOT DETECTED
RESPIRATORY SYNCYTIAL VIRUS-RVPPCR: DETECTED — AB
Rhinovirus / Enterovirus: NOT DETECTED

## 2016-05-21 LAB — GLUCOSE, CAPILLARY
GLUCOSE-CAPILLARY: 112 mg/dL — AB (ref 65–99)
Glucose-Capillary: 185 mg/dL — ABNORMAL HIGH (ref 65–99)

## 2016-05-21 MED ORDER — BENZONATATE 200 MG PO CAPS: 200.0000 mg | ORAL_CAPSULE | Freq: Three times a day (TID) | ORAL | 0 refills | Status: DC | PRN

## 2016-05-21 MED ORDER — FLUTICASONE PROPIONATE 50 MCG/ACT NA SUSP: 1.0000 | Freq: Every day | NASAL | 2 refills | Status: DC

## 2016-05-21 MED ORDER — NEPRO/CARBSTEADY PO LIQD: 237.0000 mL | Freq: Three times a day (TID) | ORAL | Status: DC

## 2016-05-21 MED ORDER — DIPHENHYDRAMINE HCL 25 MG PO CAPS
25.0000 mg | ORAL_CAPSULE | ORAL | Status: DC | PRN
  Administered 2016-05-21: 25 mg via ORAL
  Filled 2016-05-21: qty 1

## 2016-05-21 NOTE — Progress Notes (Signed)
Patient discharge teaching given, including activity, diet, follow-up appoints, and medications. Patient verbalized understanding of all discharge instructions. IV access was d/c'd. Vitals are stable. Skin is intact except as charted in most recent assessments. Pt to be escorted out by NT, to be driven home by family.  Adelaida Reindel, MBA, BSN, RN 

## 2016-05-21 NOTE — Progress Notes (Signed)
CRITICAL VALUE ALERT  Critical value received:  RSV = postive   Date of notification:  05-21-2016  Time of notification:  11:44  Critical value read back:Yes.    Nurse who received alert:  Genelle Bal, RN  MD notified (1st page):  Samtani   Time of first page:  11:45  MD notified (2nd page):  Time of second page:  Responding MD:  Verlon Au  Time MD responded:  11:46

## 2016-05-21 NOTE — Discharge Summary (Signed)
Physician Discharge Summary  Priscilla [name] TGG:[id number] DOB: 05-19-1970 DOA: 05/14/2016  PCP: Priscilla Medal, MD  Admit date: 05/14/2016 Discharge date: 05/21/2016  Time spent: 20 minutes  Recommendations for Outpatient Follow-up:  1. Symptomatic and supportive care for RSV 2. Follow-up PD instructions per Priscilla [name] 3. No chronic opiates recommended on discharge 4. Recommend consideration for outpatient cardiac catheterization as this is a prerequisite to transplant  Discharge Diagnoses:  Active Problems:   Kidney transplant (RLQ) failure and rejection   ESRD (end stage renal disease) (HCC)   Type 1 diabetes mellitus with ketoacidosis without coma (HCC)   S/P CABG x 2   DKA (diabetic ketoacidoses) (HCC)   Microcytic anemia   Hyponatremia   Transaminitis   Difficult intravenous access   Elevation of cardiac enzymes   Discharge Condition: Fair  Diet recommendation: Renal  Filed Weights   05/15/16 1825 05/16/16 2042 05/19/16 2118  Weight: 57.2 kg (126 lb 1.7 oz) 56.7 kg (125 lb) 56.2 kg (123 lb 14.4 oz)    History of present illness:  46 y/o ? DM ty 1-prior failed Pancreatic transplant/Kidney tranplant [2012-2014]-previously Prograf, Myfortic ESRD / CCPD x 2 yrs-foll Priscilla [name] Prior cocaine Sickle cell trait htn History of CABG as well as mitral valve repair 70/35/0093 complicated by hypotension chronic diarrhea-supposed to have colonoscopy  Admitted with sugar 878 and DKA, potassium 5.1 and hypertensive and confused Troponin 0.53 Initial anion gap was 18, sodium 118, BUN/creatinine 58/15.7 Lactic acid 4.5  Hospital Course:  Toxic metabolic encephalopathy-secondary to DKA, uremia Secondary to uremia-completely resolved after half treatment dialysis holding OxyIR 5-10 every 6 when necessary number Remeron 15 at bedtime, gabapentin 100 daily, Phenergan 12.5 every 6 when necessary Mentation much improved  diabetes mellitus type II DKA/hyponatremia  from osmotic diuresis-anion gap 12 transition to 4 times a day before meals at bedtime diabetic diet Blood sugar labile 05/18/16 ---now 214-316 Lowered her Lantus from 10 to 5 units 05/18/2016, careful titration of the same have increased to 10 units on discharge which is her home dose  ESRD on CPPD-will need peritoneal dialysis Metabolic bone disease as per nephrology  Anemia of renal disease hemoglobin reasonable 11  Prior CABG + mitral valve repair 07/18/15 Rising troponin--no overt chest pain but poor exam cardiology was consulted at 1/18 and feel like this is secondary to renal disease elevation in troponin as patient does not have any chest pain no further workup need outpatient work-up with cath [pre-requisite for transplant Reconsideration] - as not having active chest pain currently and troponin flat trend was flat during initial hospital stay  Hypertension-poorly controlled 2/2  lack of dialysis. Very much better controlled  Acute decompensated diastolic heart failure-last echo EF 55 Echocardiogram 1/18 EF 45-50%, mild mitral regurg no specific wall motion abnormality noted Controlled  Cough-Secondary to RSV cxr 1/20 shows persisiting pleural effusions so deciding re: IHD vs peritoneal She is on standard therapy for URI/Reactive airway dx and have added albuterol to see how she does on 1/21 through 1/22   Consultations:  Nephrology  Discharge Exam: Vitals:   05/21/16 0548 05/21/16 0830  BP: (!) 110/92 (!) 103/45  Pulse: 65 63  Resp: 18 16  Temp: 98.5 F (36.9 C) 98.6 F (37 C)    EOMI NCAT S1-S2 no murmur rub or gallop Chest clinically clear No lower extremity edema  Discharge Instructions   Discharge Instructions    Diet - low sodium heart healthy    Complete by:  As directed    Discharge  instructions    Complete by:  As directed    Take no pain meds on d/c follow with Priscilla [name] as OP You may take some otc cough meds--u had a viral infection  but this will pass on its own   Increase activity slowly    Complete by:  As directed      Current Discharge Medication List    START taking these medications   Details  benzonatate (TESSALON) 200 MG capsule Take 1 capsule (200 mg total) by mouth 3 (three) times daily as needed for cough. Qty: 20 capsule, Refills: 0    fluticasone (FLONASE) 50 MCG/ACT nasal spray Place 1 spray into both nostrils daily. Qty: 16 g, Refills: 2      CONTINUE these medications which have NOT CHANGED   Details  acetaminophen (TYLENOL) 325 MG tablet Take 650 mg by mouth every 4 (four) hours as needed for mild pain.    aspirin 325 MG tablet Take 325 mg by mouth daily.     calcitRIOL (ROCALTROL) 0.25 MCG capsule Take 1 capsule (0.25 mcg total) by mouth daily. Qty: 30 capsule, Refills: 1    calcium acetate (PHOSLO) 667 MG capsule Take 2,001 mg by mouth 3 (three) times daily with meals.    carvedilol (COREG) 6.25 MG tablet TAKE 1/2 TABLET TWICE A DAY X 3 WEEKS THEN INCREASE TO 1 TABLET TWICE A DAY Qty: 60 tablet, Refills: 6    hydrALAZINE (APRESOLINE) 10 MG tablet Take 10 mg by mouth 3 (three) times daily.    insulin glargine (LANTUS) 100 UNIT/ML injection Inject 10 Units into the skin at bedtime.     insulin lispro (HUMALOG) 100 UNIT/ML injection Inject 10-15 Units into the skin 3 (three) times daily before meals. Per sliding scale    IUD'S IU by Intrauterine route.    loperamide (IMODIUM) 2 MG capsule Take 2 mg by mouth every 8 (eight) hours as needed for diarrhea or loose stools.    losartan (COZAAR) 100 MG tablet Take 100 mg by mouth daily.    omeprazole (PRILOSEC) 20 MG capsule Take 1 capsule (20 mg total) by mouth daily as needed. Qty: 30 capsule    potassium chloride (KLOR-CON) 20 MEQ packet Take 40 mEq by mouth 2 (two) times daily.    simvastatin (ZOCOR) 20 MG tablet Take 1 tablet (20 mg total) by mouth at bedtime. PLEASE CONTACT OFFICE FOR ADDITIONAL REFILLS FINAL WARNING Qty: 30 tablet,  Refills: 0    oxyCODONE (OXY IR/ROXICODONE) 5 MG immediate release tablet Take 1-2 tablets (5-10 mg total) by mouth every 6 (six) hours as needed for severe pain. Qty: 40 tablet, Refills: 0   Associated Diagnoses: Post-op pain      STOP taking these medications     gabapentin (NEURONTIN) 100 MG capsule      ibuprofen (ADVIL,MOTRIN) 200 MG tablet      mirtazapine (REMERON) 15 MG tablet      MOVIPREP 100 G SOLR        Allergies  Allergen Reactions  . Pork-Derived Products Other (See Comments)    Pt is [place]      The results of significant diagnostics from this hospitalization (including imaging, microbiology, ancillary and laboratory) are listed below for reference.    Significant Diagnostic Studies: Dg Chest 2 View  Result Date: 05/18/2016 CLINICAL DATA:  Elevated blood sugar.  Cough. EXAM: CHEST  2 VIEW COMPARISON:  May 15, 2016 FINDINGS: Stable cardiomediastinal silhouette. Effusion and opacity in left base is stable. Mild  interstitial prominence diffusely on the previous study has improved. No other interval changes. IMPRESSION: Improved interstitial opacities with stable left effusion and underlying opacity. Electronically Signed   By: Dorise Bullion III M.D   On: 05/18/2016 15:44   Dg Chest 2 View  Result Date: 05/14/2016 CLINICAL DATA:  Cough. EXAM: CHEST  2 VIEW COMPARISON:  08/30/2014. FINDINGS: Mediastinum and hilar structures normal. Prior cardiac valve replacement and CABG. Cardiomegaly. Mild pulmonary vascular prominence and bilateral interstitial prominence with bilateral pleural effusions. Findings consistent with congestive heart failure. IMPRESSION: 1. Prior CABG.  Prior cardiac valve replacement. 2. Congestive heart failure with bilateral pulmonary interstitial edema and bilateral pleural effusions. Electronically Signed   By: Marcello Moores  Register   On: 05/14/2016 13:53   Portable Chest X-ray (1 View)  Result Date: 05/15/2016 CLINICAL DATA:  Congestive heart  failure. History of renal and pancreas transplant. Underlying diabetes and hypertension. EXAM: PORTABLE CHEST 1 VIEW COMPARISON:  05/14/2016 and 08/30/2014. FINDINGS: 0441 hour. Stable mild cardiac enlargement post CABG and mitral valve replacement. There is stable vascular congestion without overt pulmonary edema. Left pleural effusion and left basilar atelectasis/scarring appear unchanged from 2016. There is no pneumothorax. IMPRESSION: Stable cardiomegaly and chronic vascular congestion. Remaining left basilar pleuroparenchymal opacities appear chronic. Electronically Signed   By: Richardean Sale M.D.   On: 05/15/2016 07:47    Microbiology: Recent Results (from the past 240 hour(s))  MRSA PCR Screening     Status: None   Collection Time: 05/14/16  9:24 PM  Result Value Ref Range Status   MRSA by PCR NEGATIVE NEGATIVE Final    Comment:        The GeneXpert MRSA Assay (FDA approved for NASAL specimens only), is one component of a comprehensive MRSA colonization surveillance program. It is not intended to diagnose MRSA infection nor to guide or monitor treatment for MRSA infections.   Respiratory Panel by PCR     Status: Abnormal   Collection Time: 05/20/16  2:03 PM  Result Value Ref Range Status   Adenovirus NOT DETECTED NOT DETECTED Final   Coronavirus 229E NOT DETECTED NOT DETECTED Final   Coronavirus HKU1 NOT DETECTED NOT DETECTED Final   Coronavirus NL63 NOT DETECTED NOT DETECTED Final   Coronavirus OC43 NOT DETECTED NOT DETECTED Final   Metapneumovirus NOT DETECTED NOT DETECTED Final   Rhinovirus / Enterovirus NOT DETECTED NOT DETECTED Final   Influenza A NOT DETECTED NOT DETECTED Final   Influenza B NOT DETECTED NOT DETECTED Final   Parainfluenza Virus 1 NOT DETECTED NOT DETECTED Final   Parainfluenza Virus 2 NOT DETECTED NOT DETECTED Final   Parainfluenza Virus 3 NOT DETECTED NOT DETECTED Final   Parainfluenza Virus 4 NOT DETECTED NOT DETECTED Final   Respiratory Syncytial  Virus DETECTED (A) NOT DETECTED Final    Comment: CRITICAL RESULT CALLED TO, READ BACK BY AND VERIFIED WITH: ADayna Ramus RN 11:45 05/21/16 ([email or web address])    Bordetella pertussis NOT DETECTED NOT DETECTED Final   Chlamydophila pneumoniae NOT DETECTED NOT DETECTED Final   Mycoplasma pneumoniae NOT DETECTED NOT DETECTED Final     Labs: Basic Metabolic Panel:  Recent Labs Lab 05/15/16 0515 05/15/16 0948 05/15/16 1449 05/15/16 2158 05/17/16 0809 05/19/16 0404  NA 126* 123* 122* 128* 123* 125*  K 3.9 4.5 4.4 3.8 3.7 3.6  CL 86* 86* 88* 91* 88* 91*  CO2 18* 18* 18* 19* 20* 22  GLUCOSE 83 162* 180* 134* 400* 251*  BUN 63* 66* 67* 39* 40* 36*  CREATININE  14.51* 14.80*  14.65* 14.76* 9.34* 10.50* 9.78*  CALCIUM 8.2* 8.0* 7.8* 7.6* 8.0* 8.4*  MG 1.6*  --   --   --   --   --   PHOS  --   --   --   --   --  5.2*   Liver Function Tests:  Recent Labs Lab 05/14/16 1254 05/19/16 0404  AST 110*  --   ALT 109*  --   ALKPHOS 97  --   BILITOT 0.8  --   PROT 6.7  --   ALBUMIN 2.9* 1.9*   No results for input(s): LIPASE, AMYLASE in the last 168 hours. No results for input(s): AMMONIA in the last 168 hours. CBC:  Recent Labs Lab 05/14/16 1254 05/14/16 1542 05/14/16 1805 05/15/16 0948 05/19/16 0404  WBC 9.7  --   --  15.3* 9.5  NEUTROABS 8.2*  --   --   --   --   HGB 10.6* 12.2 11.2* 11.4* 9.7*  HCT 31.8* 36.0 33.0* 32.4* 28.2*  MCV 77.8*  --   --  74.8* 76.6*  PLT 170  --   --  173 144*   Cardiac Enzymes:  Recent Labs Lab 05/14/16 2251 05/15/16 0515 05/15/16 0948 05/15/16 1449 05/15/16 2158  TROPONINI 0.69* 0.91* 1.18* 1.28* 1.16*   BNP: BNP (last 3 results) No results for input(s): BNP in the last 8760 hours.  ProBNP (last 3 results) No results for input(s): PROBNP in the last 8760 hours.  CBG:  Recent Labs Lab 05/20/16 1201 05/20/16 1724 05/20/16 2244 05/21/16 0753 05/21/16 1205  GLUCAP 315* 218* 96 112* 185*       Signed:  Nita Sells MD    [facility] 05/21/2016, 12:18 PM

## 2016-06-03 ENCOUNTER — Ambulatory Visit: Payer: Medicare Other | Admitting: Cardiovascular Disease

## 2016-06-11 ENCOUNTER — Other Ambulatory Visit: Payer: Self-pay | Admitting: Surgery

## 2016-06-11 DIAGNOSIS — N189 Chronic kidney disease, unspecified: Secondary | ICD-10-CM

## 2016-06-11 DIAGNOSIS — Z01810 Encounter for preprocedural cardiovascular examination: Secondary | ICD-10-CM

## 2016-06-28 ENCOUNTER — Encounter: Payer: Self-pay | Admitting: Surgery

## 2016-07-01 ENCOUNTER — Institutional Professional Consult (permissible substitution): Payer: No Typology Code available for payment source | Admitting: Neurology

## 2016-07-02 ENCOUNTER — Encounter: Payer: Self-pay | Admitting: Neurology

## 2016-07-05 ENCOUNTER — Other Ambulatory Visit (HOSPITAL_COMMUNITY): Payer: Medicare Other

## 2016-07-05 ENCOUNTER — Encounter (HOSPITAL_COMMUNITY): Payer: Medicare Other

## 2016-07-08 ENCOUNTER — Encounter: Payer: Medicare Other | Admitting: Surgery

## 2016-07-10 ENCOUNTER — Other Ambulatory Visit: Payer: Self-pay | Admitting: Nephrology

## 2016-07-10 ENCOUNTER — Ambulatory Visit
Admission: RE | Admit: 2016-07-10 | Discharge: 2016-07-10 | Disposition: A | Discharge status: Home / Self Care | Payer: Managed Care, Other (non HMO) | Source: Ambulatory Visit | Attending: Nephrology | Admitting: Nephrology

## 2016-07-10 DIAGNOSIS — R05 Cough: Secondary | ICD-10-CM

## 2016-07-10 DIAGNOSIS — R059 Cough, unspecified: Secondary | ICD-10-CM

## 2016-07-17 ENCOUNTER — Telehealth: Payer: Self-pay

## 2016-07-17 ENCOUNTER — Institutional Professional Consult (permissible substitution): Payer: Medicare Other | Admitting: Neurology

## 2016-07-17 NOTE — Telephone Encounter (Signed)
Pt did not show for their appt with Dr. Brett Fairy today for a sleep consult.  Pt also did not show on 07/01/2016 for a sleep consult appt with Dr. Brett Fairy.  These are 2 new patient no-shows, and per [facility] policy, this is grounds for dismissal.  Do you want to dismiss?

## 2016-07-17 NOTE — Telephone Encounter (Signed)
Send to Truett Perna for letter of dismissal.

## 2016-07-18 ENCOUNTER — Encounter: Payer: Self-pay | Admitting: Neurology

## 2016-08-01 ENCOUNTER — Encounter: Payer: Self-pay | Admitting: Surgery

## 2016-08-02 ENCOUNTER — Encounter: Payer: Self-pay | Admitting: Cardiovascular Disease

## 2016-08-02 ENCOUNTER — Ambulatory Visit (INDEPENDENT_AMBULATORY_CARE_PROVIDER_SITE_OTHER): Payer: Medicare Other | Admitting: Cardiovascular Disease

## 2016-08-02 VITALS — BP 100/70 | HR 62 | Ht 60.0 in | Wt 113.0 lb

## 2016-08-02 DIAGNOSIS — Z992 Dependence on renal dialysis: Secondary | ICD-10-CM | POA: Diagnosis not present

## 2016-08-02 DIAGNOSIS — E1022 Type 1 diabetes mellitus with diabetic chronic kidney disease: Secondary | ICD-10-CM

## 2016-08-02 DIAGNOSIS — Z9889 Other specified postprocedural states: Secondary | ICD-10-CM | POA: Diagnosis not present

## 2016-08-02 DIAGNOSIS — I5042 Chronic combined systolic (congestive) and diastolic (congestive) heart failure: Secondary | ICD-10-CM

## 2016-08-02 DIAGNOSIS — N186 End stage renal disease: Secondary | ICD-10-CM

## 2016-08-02 DIAGNOSIS — I214 Non-ST elevation (NSTEMI) myocardial infarction: Secondary | ICD-10-CM | POA: Diagnosis not present

## 2016-08-02 DIAGNOSIS — Z79899 Other long term (current) drug therapy: Secondary | ICD-10-CM

## 2016-08-02 DIAGNOSIS — E785 Hyperlipidemia, unspecified: Secondary | ICD-10-CM | POA: Diagnosis not present

## 2016-08-02 DIAGNOSIS — E782 Mixed hyperlipidemia: Secondary | ICD-10-CM | POA: Insufficient documentation

## 2016-08-02 DIAGNOSIS — I2510 Atherosclerotic heart disease of native coronary artery without angina pectoris: Secondary | ICD-10-CM

## 2016-08-02 MED ORDER — ATORVASTATIN CALCIUM 20 MG PO TABS: 20.0000 mg | ORAL_TABLET | Freq: Every day | ORAL | 3 refills | Status: DC

## 2016-08-02 NOTE — Progress Notes (Signed)
Cardiology Office Note    Date:  08/02/2016   ID:  Priscilla [name], DOB Aug 27, 1970, MRN [id number]  PCP:  [name], MD  Cardiologist:   [name], MD   Chief Complaint  Patient presents with  . Follow-up    pt has no complaints     History of Present Illness:  Priscilla [name] is a 46 y.o. female with multiple complications of long-standing type 1 diabetes mellitus who returns for her first follow-up visit since September 2016. She has end-stage renal disease and was previously on peritoneal dialysis at home (now temporarily on hemodialysis), has coronary artery disease and previous bypass surgery (2016, Dr. Roxan Hockey, LIMA to LAD and SVG to PDA, mitral valve repair #28 Memo ring for severe MR, PFO closure), mildly depressed left ventricular systolic function. 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   Last year she underwent repeat evaluation for another transplant at [facility] in [place]. Part of this included cardiac catheterization. She tells me that she had an attempted percutaneous angioplasty in September 2017 which failed. After the procedure she was informed that she was declined for repeat transplantation. She has already been declined for transplantation at [facility] and [place]. She had been on the transplant list at [facility], but after these events became very discouraged and has not followed up.  She was hospitalized in January 2018 in [place] for diabetic ketoacidosis and respiratory syncytial virus infection. Her recovery has been delayed. She was extremely weak and did not feel able to perform her own peritoneal dialysis so she is currently temporarily receiving hemodialysis as an ambulatory patient using a tunneled permacath in the left IJ. She is planning to have an AV fistula placed for future emergencies, but is also planning to go back to peritoneal dialysis next week. She has not been working  since last January and has disability. Despite this she intends to eventually go back to work.  She feels substantially improved. Her dry weight is estimated to be 51.5 kg, but she has noticed that she feels best when she is down to 50.5 kg. She is completely anuric. She tends to run a blood pressure in the high 80s-90s/60-70s but never has problems with dizziness or syncope. She denies any dyspnea or angina at rest or with exertion. She has rare problems with palpitations, especially if her potassium is low. She had a protracted cough after her January respiratory illness but this resolved after Dr. Joelyn Oms gave her a Z-Pak a couple of weeks ago.  Before her bypass surgery her ejection fraction was 35-40% and she had moderate-severe tricuspid regurgitation. After bypass surgery her EF normalized and she had an excellent mitral valve repair with mild residual MR and mild TR. Echo in January 2018 shows EF back down to 45-50%, but the mitral valve repair is holding up nicely and the PA pressure is only 38 mmHg.  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  Past Medical History:  Diagnosis Date  . Anemia   . Cardiomyopathy, ischemic 06/24/2014  . Diabetes mellitus without complication ([place])   . End stage renal disease ([place])   . GERD (gastroesophageal reflux disease)   . H/O kidney transplant   . Hyperlipidemia   . Hypertension   . Pancreas transplanted ([place])   . Renal disorder   . 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 S/P mitral valve repair 07/18/14    Past  Surgical History:  Procedure Laterality Date  . CAPD INSERTION N/A 09/08/2013   Procedure: Laparoscopic CAPD peritoneal dialysis catheter placement, possible lysis of adhesions   ;  Surgeon: Adin Hector, MD;  Location: [place];  Service: General;  Laterality: N/A;  . CARDIAC CATHETERIZATION     06/2014  . CESAREAN SECTION    . COMBINED KIDNEY-PANCREAS TRANSPLANT  05/2010   [facility]  Pancreas left mid abdomen  . CORONARY ARTERY BYPASS GRAFT N/A 07/18/2014   Procedure: CORONARY ARTERY BYPASS GRAFTING (CABG)X2 LIMA-LAD; SVG-PD;  Surgeon: Melrose Nakayama, MD;  Location: [place];  Service: Open Heart Surgery;  Laterality: N/A;  . INSERTION OF DIALYSIS CATHETER  September 28, 2012   Right upper chest  . LAPAROSCOPIC INSERTION PERITONEAL CATHETER  07/18/2008   Dr Excell Seltzer  . LAPAROSCOPIC REPOSITIONING CAPD CATHETER  03-09-2009   Dr Excell Seltzer  . LAPAROSCOPIC REPOSITIONING CAPD CATHETER  11/08/2008   Dr Excell Seltzer  . LAPAROSCOPIC REPOSITIONING CAPD CATHETER  10/03/2009   Unplugging CAPD catheter Dr Excell Seltzer  . LEFT HEART CATHETERIZATION WITH CORONARY ANGIOGRAM N/A 06/28/2014   Procedure: LEFT HEART CATHETERIZATION WITH CORONARY ANGIOGRAM;  Surgeon: [name], MD;  Location: [place] CATH [facility];  Service: Cardiovascular;  Laterality: N/A;  . MITRAL VALVE REPAIR N/A 07/18/2014   Procedure: MITRAL VALVE REPAIR (MVR);  Surgeon: Melrose Nakayama, MD;  Location: [place];  Service: Open Heart Surgery;  Laterality: N/A;  . OMENTECTOMY  11/08/2008   Dr Excell Seltzer  . TEE WITHOUT CARDIOVERSION N/A 07/18/2014   Procedure: TRANSESOPHAGEAL ECHOCARDIOGRAM (TEE);  Surgeon: Melrose Nakayama, MD;  Location: [place];  Service: Open Heart Surgery;  Laterality: N/A;    Current Medications: Outpatient Medications Prior to Visit  Medication Sig Dispense Refill  . acetaminophen (TYLENOL) 325 MG tablet Take 650 mg by mouth every 4 (four) hours as needed for mild pain.    Marland Kitchen aspirin 325 MG tablet Take 325 mg by mouth daily.     . calcitRIOL (ROCALTROL) 0.25 MCG capsule Take 1 capsule (0.25 mcg total) by mouth daily. (Patient taking differently: Take 0.25 mcg by mouth 2 (two) times daily. ) 30 capsule 1  . calcium acetate (PHOSLO) 667 MG capsule Take 2,001 mg by mouth 3 (three) times daily with meals.    . carvedilol (COREG) 6.25 MG tablet TAKE 1/2 TABLET TWICE A DAY X 3 WEEKS THEN INCREASE TO 1 TABLET  TWICE A DAY (Patient taking differently: Take 6.25 mg by mouth 2 (two) times daily with a meal. ) 60 tablet 6  . fluticasone (FLONASE) 50 MCG/ACT nasal spray Place 1 spray into both nostrils daily. 16 g 2  . insulin glargine (LANTUS) 100 UNIT/ML injection Inject 10 Units into the skin at bedtime.     . insulin lispro (HUMALOG) 100 UNIT/ML injection Inject 10-15 Units into the skin 3 (three) times daily before meals. Per sliding scale    . IUD'S IU by Intrauterine route.    . loperamide (IMODIUM) 2 MG capsule Take 2 mg by mouth every 8 (eight) hours as needed for diarrhea or loose stools.    Marland Kitchen losartan (COZAAR) 100 MG tablet Take 100 mg by mouth daily.    . simvastatin (ZOCOR) 20 MG tablet Take 1 tablet (20 mg total) by mouth at bedtime. PLEASE CONTACT OFFICE FOR ADDITIONAL REFILLS FINAL WARNING (Patient taking differently: Take 20 mg by mouth at bedtime. ) 30 tablet 0  . omeprazole (PRILOSEC) 20 MG capsule Take 1 capsule (20 mg total) by  mouth daily as needed. (Patient taking differently: Take 20 mg by mouth daily as needed (for heartburn/reflux). ) 30 capsule   . benzonatate (TESSALON) 200 MG capsule Take 1 capsule (200 mg total) by mouth 3 (three) times daily as needed for cough. (Patient not taking: Reported on 08/02/2016) 20 capsule 0  . hydrALAZINE (APRESOLINE) 10 MG tablet Take 10 mg by mouth 3 (three) times daily.    Marland Kitchen oxyCODONE (OXY IR/ROXICODONE) 5 MG immediate release tablet Take 1-2 tablets (5-10 mg total) by mouth every 6 (six) hours as needed for severe pain. (Patient not taking: Reported on 05/14/2016) 40 tablet 0  . potassium chloride (KLOR-CON) 20 MEQ packet Take 40 mEq by mouth 2 (two) times daily.     No facility-administered medications prior to visit.      Allergies:   Pork-derived products   Social History   Social History  . Marital status: Divorced    Spouse name: N/A  . Number of children: 1  . Years of education: N/A   Occupational History  . Manager for social  services    Social History Main Topics  . Smoking status: Never Smoker  . Smokeless tobacco: Never Used  . Alcohol use No  . Drug use: No  . Sexual activity: Not Currently    Birth control/ protection: None   Other Topics Concern  . None   Social History Narrative  . None     Family History:  The patient's family history includes CAD in her mother; COPD in her father; Stroke in her mother.   ROS:   Please see the history of present illness.    ROS All other systems reviewed and are negative.   PHYSICAL EXAM:   VS:  BP 100/70   Pulse 62   Ht 5' (1.524 m)   Wt 51.3 kg (113 lb)   BMI 22.07 kg/m    GEN: Well nourished, well developed, in no acute distress . She is very slender and petite but does not appear cachectic HEENT: normal  Neck: There is a left IJ tunneled catheter for dialysis, her right IJ is distended, carotid bruits, or masses Cardiac: RRR; widely split S2, S4 present, no murmurs, rubs, or gallops,no edema  Respiratory:  clear to auscultation bilaterally, normal work of breathing GI: soft, nontender, nondistended, + BS MS: no deformity or atrophy  Skin: warm and dry, no rash Neuro:  Alert and Oriented x 3, Strength and sensation are intact Psych: euthymic mood, full affect  Wt Readings from Last 3 Encounters:  08/02/16 51.3 kg (113 lb)  05/19/16 56.2 kg (123 lb 14.4 oz)  01/16/15 49.8 kg (109 lb 12.8 oz)      Studies/Labs Reviewed:   EKG:  EKG is not ordered today.  The ekg ordered 05/15/16 demonstrates sinus rhythm, right bundle branch block, lateral ST segment depression  Recent Labs: 05/14/2016: ALT 109 05/15/2016: Magnesium 1.6 05/19/2016: BUN 36; Creatinine, Ser 9.78; Hemoglobin 9.7; Platelets 144; Potassium 3.6; Sodium 125   Lipid Panel    Component Value Date/Time   CHOL 196 01/16/2015 0912   TRIG 76 01/16/2015 0912   HDL 57 01/16/2015 0912   CHOLHDL 3.4 01/16/2015 0912   VLDL 15 01/16/2015 0912   LDLCALC 124 01/16/2015 0912     ASSESSMENT:    1. Dyslipidemia   2. Medication management   3. Chronic combined systolic and diastolic congestive heart failure ([place])   4. Acute non-ST elevation myocardial infarction (NSTEMI) ([place])  PLAN:  In order of problems listed above:  1. CHF: Despite all the recent problems she has had she appears to be at euvolemic status and has NYHA functional class I status. Mildly depressed EF 45%, lower than the normal EF she had immediately after bypass surgery. She is on appropriate treatment with ARBs and beta blockers and her blood pressure would not allow further titration. She is completely anuric and volume management is via dialysis. Should become easier to manage once she switches back to peritoneal dialysis next week. 2. CAD: She does not have problems with angina pectoris. Need to get the records from [place] regarding her attempted PCI. Since she does not have angina pectoris, no clear indication for any new attempts at revascularization, except to the degree that they may have impact on her transplant candidacy. Refer to cardiac rehabilitation after her NSTEMI in January/depressed LVEF. She'd benefit from return to regular physical activity 3. S/P MV annuloplasty repair: Good long-term result 4. HLP: She reports her last prescription for simvastatin cost her more than $100. We'll switch to atorvastatin and recheck labs in a couple of months. Target LDL under 70. 5. ESRD: She was temporarily too ill to be responsible for her own peritoneal dialysis, but greatly prefers the autonomy this provides her. There is a plan for placement of a new AV fistula to be available in the case of future emergencies. Would like to review her coronary angiogram from [facility], but I doubt that there will be findings on it that would make her a poor candidate for vascular surgery. She has an appointment scheduled with Dr. Trula Slade on April 16. 6. DM type 1: Unfortunately she has reaped numerous  complications from this disorder, which she has had since age 62. She has always had fairly difficult control of her hyperglycemia, previously had decent control. In January her A1c was 15%. I suspect that after she was rejected for repeat transplantation she may have developed some disinterest or even depression and was not taking care of herself as well as she did in the past. She seems better adjusted at this point and seems to be taking responsibility again   Medication Adjustments/Labs and Tests Ordered: Current medicines are reviewed at length with the patient today.  Concerns regarding medicines are outlined above.  Medication changes, Labs and Tests ordered today are listed in the Patient Instructions below. Patient Instructions  Medication Instructions: Dr Sallyanne Kuster has recommended making the following medication changes: 1. START Atorvastatin 20 mg - take 1 tablet by mouth daily. Stop simvastatin.  Labwork: Your physician recommends that you return for lab work in [place].  Testing/Procedures: NONE ORDERED  Follow-up: Dr Sallyanne Kuster recommends that you schedule a follow-up appointment in 6 months. You will receive a reminder letter in the mail two months in advance. If you don't receive a letter, please call our office to schedule the follow-up appointment.  If you need a refill on your cardiac medications before your next appointment, please call your pharmacy.  You have been referred to Cardiac Rehab at [facility]. They will contact you to set this up.    Signed, [name], MD  08/02/2016 3:38 PM    [facility] [address], [place], [place]  [address] Phone: [phone]; Fax: [phone]

## 2016-08-02 NOTE — Patient Instructions (Signed)
Medication Instructions: Dr Sallyanne Kuster has recommended making the following medication changes: 1. START Atorvastatin 20 mg - take 1 tablet by mouth daily  Labwork: Your physician recommends that you return for lab work in [place].  Testing/Procedures: NONE ORDERED  Follow-up: Dr Sallyanne Kuster recommends that you schedule a follow-up appointment in 6 months. You will receive a reminder letter in the mail two months in advance. If you don't receive a letter, please call our office to schedule the follow-up appointment.  If you need a refill on your cardiac medications before your next appointment, please call your pharmacy.  You have been referred to Cardiac Rehab at [facility]. They will contact you to set this up.

## 2016-08-05 ENCOUNTER — Ambulatory Visit (INDEPENDENT_AMBULATORY_CARE_PROVIDER_SITE_OTHER)
Admission: RE | Admit: 2016-08-05 | Discharge: 2016-08-05 | Disposition: A | Discharge status: Home / Self Care | Payer: Medicare Other | Source: Ambulatory Visit | Attending: Surgery | Admitting: Surgery

## 2016-08-05 ENCOUNTER — Ambulatory Visit (HOSPITAL_COMMUNITY)
Admission: RE | Admit: 2016-08-05 | Discharge: 2016-08-05 | Disposition: A | Discharge status: Home / Self Care | Payer: Medicare Other | Source: Ambulatory Visit | Attending: Surgery | Admitting: Surgery

## 2016-08-05 DIAGNOSIS — N189 Chronic kidney disease, unspecified: Secondary | ICD-10-CM

## 2016-08-05 DIAGNOSIS — Z01810 Encounter for preprocedural cardiovascular examination: Secondary | ICD-10-CM

## 2016-08-08 ENCOUNTER — Other Ambulatory Visit (HOSPITAL_COMMUNITY): Payer: Self-pay | Admitting: *Deleted

## 2016-08-08 DIAGNOSIS — I214 Non-ST elevation (NSTEMI) myocardial infarction: Secondary | ICD-10-CM

## 2016-08-09 ENCOUNTER — Telehealth (HOSPITAL_COMMUNITY): Payer: Self-pay | Admitting: Internal Medicine

## 2016-08-09 NOTE — Telephone Encounter (Addendum)
Late entry from 08/08/16-  I called and left message on patient voicemail about scheduling and participating in the Cardiac Rehab program. I left my contact information on voicemail to return call. We also need to know who her nephrologist is.

## 2016-08-09 NOTE — Telephone Encounter (Signed)
Patient insurances is active and verified. Patient has Medicare A/B and C[employer]. Medicare A/B - no co-payment, deductible $183.00/$183.00 has been met, no out of pocket and 20% co-insurance. Passport/reference # [id number].  C[employer]a - no co-payment, no deductible, out of pocket max has been met, no co-insurance. Patient insurance covers 100% due to out of pocket being met and limit is 36 visit. Spoke with [facility] @ [place] reference # [id number] on 08/08/16.

## 2016-08-12 ENCOUNTER — Telehealth: Payer: Self-pay | Admitting: Cardiovascular Disease

## 2016-08-12 ENCOUNTER — Encounter: Payer: Medicare Other | Admitting: Surgery

## 2016-08-12 NOTE — Telephone Encounter (Signed)
Received records that were requested by Dr Sallyanne Kuster back from [facility] in [place].  Records given to Dr Sallyanne Kuster for review. lp

## 2016-08-12 NOTE — Telephone Encounter (Signed)
Faxed Release signed by patient to [facility] [place] to obtain records per Dr Croitoru's request.,  Faxed on 08/12/16. lp

## 2016-08-14 ENCOUNTER — Telehealth (HOSPITAL_COMMUNITY): Payer: Self-pay | Admitting: Internal Medicine

## 2016-08-14 NOTE — Telephone Encounter (Signed)
*   Second phone call * I called and left message on patient voicemail about scheduling and participating in the Cardiac Rehab program. We also need to know who her nephrologist is. I left my contact information on patient voicemail.

## 2016-08-15 ENCOUNTER — Telehealth (HOSPITAL_COMMUNITY): Payer: Self-pay | Admitting: *Deleted

## 2016-08-15 NOTE — Telephone Encounter (Signed)
-----   Message from Rexene Agent, MD sent at 08/15/2016  1:56 PM EDT ----- Regarding: RE: Clearance to participate in cardiac rehab I think her BP won't be a problem. No problems with lifiting weights up to 20lb. RS ----- Message ----- From: Rowe Pavy, RN Sent: 08/15/2016  11:51 AM To: Rexene Agent, MD Subject: RE: Clearance to participate in cardiac rehab   Thank you so much for your quick response.  As far as restrictions-  I do not know her trends pre, during and post dialysis. I will find out which center she receives her dialysis and  ask for this information as well. Sometimes we have encountered in the past low bp.  We prefer bp at or above 048 systolic in order to proceed with exercise. However, if lower than 889 systolic is her normal and or we have pt specific parameters and she is asymptomatic we can proceed with exercise. I am hopeful that with a day recovery we will not have any issues. I wanted to be prepared just in case.  Any lifting restrictions post dialysis?  We use hand weights along with cool down.  Thanks again Maurice Small RN, BSN Cardiac and Pulmonary Rehab Nurse Navigator   ----- Message ----- From: Rexene Agent, MD Sent: 08/15/2016   9:31 AM To: Rowe Pavy, RN Subject: RE: Clearance to participate in cardiac rehab  I think patient can participate. What type of restrictions are you looking for?   ----- Message ----- From: Rowe Pavy, RN Sent: 08/14/2016   2:05 PM To: Rexene Agent, MD Subject: Clearance to participate in cardiac rehab      Dr. Joelyn Oms,   The above pt was referred to cardiac rehab s/p Nstemi by Dr. Recardo Evangelist.  Pt receives dialysis on Tuesdays, Thursdays and Saturdays.  Our cardiac rehab program meets on Mondays, Wednesdays and Fridays and she would like to participate at the 6:45 am class.  In the past it has been challenging for some of our rehab pts who also receive dialysis participate.  Do you forsee any  challenges? Feel participation is appropriate for this pt.? If you agree, please provide bp parameters that are acceptable for this pt.  Thanks so much for your input and guidance  Psychologist, clinical, BSN Cardiac and Pulmonary Rehab Nurse Navigator

## 2016-09-03 ENCOUNTER — Ambulatory Visit (HOSPITAL_COMMUNITY): Payer: Managed Care, Other (non HMO)

## 2016-09-09 ENCOUNTER — Ambulatory Visit (HOSPITAL_COMMUNITY): Payer: Managed Care, Other (non HMO)

## 2016-09-10 ENCOUNTER — Inpatient Hospital Stay (HOSPITAL_COMMUNITY): Admission: RE | Admit: 2016-09-10 | Payer: Managed Care, Other (non HMO) | Source: Ambulatory Visit

## 2016-09-10 ENCOUNTER — Telehealth (HOSPITAL_COMMUNITY): Payer: Self-pay | Admitting: Cardiac Rehabilitation

## 2016-09-10 NOTE — Telephone Encounter (Signed)
No show for cardiac rehab orientation. Pt confirmed appt with nursing telephone interview this morning.  LM on pt answering machine to assess reason for no show.

## 2016-09-11 ENCOUNTER — Ambulatory Visit (HOSPITAL_COMMUNITY): Payer: Managed Care, Other (non HMO)

## 2016-09-13 ENCOUNTER — Ambulatory Visit (HOSPITAL_COMMUNITY): Payer: Managed Care, Other (non HMO)

## 2016-09-16 ENCOUNTER — Ambulatory Visit (HOSPITAL_COMMUNITY): Payer: Managed Care, Other (non HMO)

## 2016-09-18 ENCOUNTER — Ambulatory Visit (HOSPITAL_COMMUNITY): Payer: Managed Care, Other (non HMO)

## 2016-09-20 ENCOUNTER — Ambulatory Visit (HOSPITAL_COMMUNITY): Payer: Managed Care, Other (non HMO)

## 2016-09-25 ENCOUNTER — Ambulatory Visit (HOSPITAL_COMMUNITY): Payer: Managed Care, Other (non HMO)

## 2016-09-27 ENCOUNTER — Ambulatory Visit (HOSPITAL_COMMUNITY): Payer: Managed Care, Other (non HMO)

## 2016-09-30 ENCOUNTER — Ambulatory Visit (HOSPITAL_COMMUNITY): Payer: Managed Care, Other (non HMO)

## 2016-10-02 ENCOUNTER — Ambulatory Visit (HOSPITAL_COMMUNITY): Payer: Managed Care, Other (non HMO)

## 2016-10-04 ENCOUNTER — Ambulatory Visit (HOSPITAL_COMMUNITY): Payer: Managed Care, Other (non HMO)

## 2016-10-07 ENCOUNTER — Ambulatory Visit (HOSPITAL_COMMUNITY): Payer: Managed Care, Other (non HMO)

## 2016-10-09 ENCOUNTER — Ambulatory Visit (HOSPITAL_COMMUNITY): Payer: Managed Care, Other (non HMO)

## 2016-10-11 ENCOUNTER — Ambulatory Visit (HOSPITAL_COMMUNITY): Payer: Managed Care, Other (non HMO)

## 2016-10-14 ENCOUNTER — Ambulatory Visit (HOSPITAL_COMMUNITY): Payer: Managed Care, Other (non HMO)

## 2016-10-16 ENCOUNTER — Ambulatory Visit (HOSPITAL_COMMUNITY): Payer: Managed Care, Other (non HMO)

## 2016-10-18 ENCOUNTER — Ambulatory Visit (HOSPITAL_COMMUNITY): Payer: Managed Care, Other (non HMO)

## 2016-10-21 ENCOUNTER — Ambulatory Visit (HOSPITAL_COMMUNITY): Payer: Managed Care, Other (non HMO)

## 2016-10-23 ENCOUNTER — Ambulatory Visit (HOSPITAL_COMMUNITY): Payer: Managed Care, Other (non HMO)

## 2016-10-25 ENCOUNTER — Ambulatory Visit (HOSPITAL_COMMUNITY): Payer: Managed Care, Other (non HMO)

## 2016-10-28 ENCOUNTER — Ambulatory Visit (HOSPITAL_COMMUNITY): Payer: Managed Care, Other (non HMO)

## 2016-11-01 ENCOUNTER — Ambulatory Visit (HOSPITAL_COMMUNITY): Payer: Managed Care, Other (non HMO)

## 2016-11-04 ENCOUNTER — Ambulatory Visit (HOSPITAL_COMMUNITY): Payer: Managed Care, Other (non HMO)

## 2016-11-06 ENCOUNTER — Ambulatory Visit (HOSPITAL_COMMUNITY): Payer: Managed Care, Other (non HMO)

## 2016-11-08 ENCOUNTER — Ambulatory Visit (HOSPITAL_COMMUNITY): Payer: Managed Care, Other (non HMO)

## 2016-11-11 ENCOUNTER — Ambulatory Visit (HOSPITAL_COMMUNITY): Payer: Managed Care, Other (non HMO)

## 2016-11-13 ENCOUNTER — Ambulatory Visit (HOSPITAL_COMMUNITY): Payer: Managed Care, Other (non HMO)

## 2016-11-15 ENCOUNTER — Ambulatory Visit (HOSPITAL_COMMUNITY): Payer: Managed Care, Other (non HMO)

## 2016-11-18 ENCOUNTER — Ambulatory Visit (HOSPITAL_COMMUNITY): Payer: Managed Care, Other (non HMO)

## 2016-11-20 ENCOUNTER — Ambulatory Visit (HOSPITAL_COMMUNITY): Payer: Managed Care, Other (non HMO)

## 2016-11-22 ENCOUNTER — Ambulatory Visit (HOSPITAL_COMMUNITY): Payer: Managed Care, Other (non HMO)

## 2016-11-25 ENCOUNTER — Ambulatory Visit (HOSPITAL_COMMUNITY): Payer: Managed Care, Other (non HMO)

## 2016-11-27 ENCOUNTER — Ambulatory Visit (HOSPITAL_COMMUNITY): Payer: Managed Care, Other (non HMO)

## 2016-11-29 ENCOUNTER — Ambulatory Visit (HOSPITAL_COMMUNITY): Payer: Managed Care, Other (non HMO)

## 2016-12-02 ENCOUNTER — Ambulatory Visit (HOSPITAL_COMMUNITY): Payer: Managed Care, Other (non HMO)

## 2016-12-04 ENCOUNTER — Ambulatory Visit (HOSPITAL_COMMUNITY): Payer: Managed Care, Other (non HMO)

## 2016-12-06 ENCOUNTER — Ambulatory Visit (HOSPITAL_COMMUNITY): Payer: Managed Care, Other (non HMO)

## 2016-12-09 ENCOUNTER — Ambulatory Visit (HOSPITAL_COMMUNITY): Payer: Managed Care, Other (non HMO)

## 2016-12-11 ENCOUNTER — Ambulatory Visit (HOSPITAL_COMMUNITY): Payer: Managed Care, Other (non HMO)

## 2016-12-13 ENCOUNTER — Ambulatory Visit (HOSPITAL_COMMUNITY): Payer: Managed Care, Other (non HMO)

## 2016-12-16 ENCOUNTER — Ambulatory Visit (HOSPITAL_COMMUNITY): Payer: Managed Care, Other (non HMO)

## 2016-12-18 ENCOUNTER — Ambulatory Visit (HOSPITAL_COMMUNITY): Payer: Managed Care, Other (non HMO)

## 2017-08-20 ENCOUNTER — Other Ambulatory Visit: Payer: Self-pay | Admitting: Podiatry

## 2017-08-20 ENCOUNTER — Ambulatory Visit (INDEPENDENT_AMBULATORY_CARE_PROVIDER_SITE_OTHER): Payer: 59

## 2017-08-20 ENCOUNTER — Encounter: Payer: Self-pay | Admitting: Podiatry

## 2017-08-20 ENCOUNTER — Ambulatory Visit (INDEPENDENT_AMBULATORY_CARE_PROVIDER_SITE_OTHER): Payer: 59 | Admitting: Podiatry

## 2017-08-20 ENCOUNTER — Telehealth: Payer: Self-pay | Admitting: Cardiovascular Disease

## 2017-08-20 DIAGNOSIS — M79672 Pain in left foot: Secondary | ICD-10-CM

## 2017-08-20 DIAGNOSIS — E1151 Type 2 diabetes mellitus with diabetic peripheral angiopathy without gangrene: Secondary | ICD-10-CM | POA: Diagnosis not present

## 2017-08-20 DIAGNOSIS — M779 Enthesopathy, unspecified: Secondary | ICD-10-CM

## 2017-08-20 DIAGNOSIS — M7752 Other enthesopathy of left foot: Secondary | ICD-10-CM

## 2017-08-20 MED ORDER — TRIAMCINOLONE ACETONIDE 10 MG/ML IJ SUSP
10.0000 mg | Freq: Once | INTRAMUSCULAR | Status: AC
  Administered 2017-08-20: 10 mg

## 2017-08-20 NOTE — Telephone Encounter (Signed)
Pt calling and had test done at [facility] and told it was abnormal and need to speak to doctor to determine what need to be done next. Please call pt

## 2017-08-20 NOTE — Progress Notes (Signed)
   Subjective:    Patient ID: Priscilla [name], female    DOB: 1970-11-06, 47 y.o.   MRN: [id number]  HPI    Review of Systems  Musculoskeletal: Positive for gait problem.  All other systems reviewed and are negative.      Objective:   Physical Exam        Assessment & Plan:

## 2017-08-20 NOTE — Progress Notes (Signed)
Subjective:   Patient ID: Priscilla [name], female   DOB: 47 y.o.   MRN: [id number]   HPI Patient presents stating that it feels like she is walking on a balloon on the bottom of her left foot and is been sore and also she is very poor health and had has had issues with heart and circulatory issues in other areas.  Patient does not smoke likes to be active   Review of Systems  All other systems reviewed and are negative.       Objective:  Physical Exam  Constitutional: She appears well-developed and well-nourished.  Cardiovascular: Intact distal pulses.  Pulmonary/Chest: Effort normal.  Musculoskeletal: Normal range of motion.  Neurological: She is alert.  Skin: Skin is warm.  Nursing note and vitals reviewed.   Neurovascular status found to be diminished with no palpable pulses muscle strength found to be adequate range of motion within normal limits.Marland Kitchen  ABIs taken today indicated arterial disease and I did note in the left foot there is quite a bit of inflammation pain around the fourth metatarsal phalangeal joint which is probably separate that any problems that may be related to circulatory condition.     Assessment:  Poor health individual who is on dialysis and has had a long-term history of diabetes who has inflammatory capsulitis and probable circulatory disease     Plan:  H&P x-rays reviewed ABIs discussed.  At this point I did a careful injection around the fourth MPJ to reduce the inflammation around the joint surface and then I went ahead and I gave her the results over the past day and she is can talk to her cardiologist about whether or not he can check her for arterial disease and if not she will call us and will make an appointment for her.  Patient does understand that even though she is not symptomatic at this time I would like her to have this evaluated for the future  X-ray indicates that there is calcification of the arteries and no indication of stress  fracture or advanced arthritis

## 2017-08-20 NOTE — Telephone Encounter (Signed)
Returned the call to the patient. She stated that she saw her podiatrist today and they were concerned about numbness that she has been experiencing in her foot. She stated that they did a "circulation test" and that it came back abnormal. She stated that her podiatrist suggested that she follow up with her cardiologist or vascular. Message has been routed to the provider and his assist.

## 2017-08-22 NOTE — Telephone Encounter (Signed)
Please schedule for ABI and if abnormal, should be followed by full Duplex US of lower extremities arterial system. Thanks

## 2017-08-25 NOTE — Telephone Encounter (Signed)
Call placed to the patient. No answer and voicemail was full.

## 2017-08-26 NOTE — Telephone Encounter (Signed)
The patient stated that she has been referred to Dr. Donnetta Hutching and that she had this duplex at [facility]. She has been advised to call us back if anything further is needed.

## 2017-08-28 ENCOUNTER — Telehealth: Payer: Self-pay

## 2017-08-28 NOTE — Telephone Encounter (Signed)
Notes sent to scheduling.   

## 2017-09-01 ENCOUNTER — Telehealth: Payer: Self-pay | Admitting: Cardiology

## 2017-09-01 NOTE — Telephone Encounter (Signed)
Received records from [facility] on 09/01/17, Appt 09/25/17 @ 10:00am.[place]

## 2017-09-03 ENCOUNTER — Other Ambulatory Visit: Payer: Self-pay

## 2017-09-03 DIAGNOSIS — I739 Peripheral vascular disease, unspecified: Secondary | ICD-10-CM

## 2017-09-07 ENCOUNTER — Emergency Department (HOSPITAL_COMMUNITY)
Admission: EM | Admit: 2017-09-07 | Discharge: 2017-09-07 | Disposition: A | Discharge status: Home / Self Care | Payer: Medicare Other | Attending: Emergency Medicine | Admitting: Emergency Medicine

## 2017-09-07 ENCOUNTER — Emergency Department (HOSPITAL_COMMUNITY): Payer: Medicare Other

## 2017-09-07 ENCOUNTER — Other Ambulatory Visit: Payer: Self-pay

## 2017-09-07 ENCOUNTER — Encounter (HOSPITAL_COMMUNITY): Payer: Self-pay | Admitting: Emergency Medicine

## 2017-09-07 DIAGNOSIS — E1010 Type 1 diabetes mellitus with ketoacidosis without coma: Secondary | ICD-10-CM | POA: Diagnosis not present

## 2017-09-07 DIAGNOSIS — I132 Hypertensive heart and chronic kidney disease with heart failure and with stage 5 chronic kidney disease, or end stage renal disease: Secondary | ICD-10-CM | POA: Diagnosis not present

## 2017-09-07 DIAGNOSIS — Z951 Presence of aortocoronary bypass graft: Secondary | ICD-10-CM | POA: Diagnosis not present

## 2017-09-07 DIAGNOSIS — Z79899 Other long term (current) drug therapy: Secondary | ICD-10-CM | POA: Diagnosis not present

## 2017-09-07 DIAGNOSIS — I509 Heart failure, unspecified: Secondary | ICD-10-CM | POA: Insufficient documentation

## 2017-09-07 DIAGNOSIS — Z7982 Long term (current) use of aspirin: Secondary | ICD-10-CM | POA: Diagnosis not present

## 2017-09-07 DIAGNOSIS — Z794 Long term (current) use of insulin: Secondary | ICD-10-CM | POA: Diagnosis not present

## 2017-09-07 DIAGNOSIS — E1022 Type 1 diabetes mellitus with diabetic chronic kidney disease: Secondary | ICD-10-CM | POA: Diagnosis not present

## 2017-09-07 DIAGNOSIS — M79672 Pain in left foot: Secondary | ICD-10-CM | POA: Diagnosis present

## 2017-09-07 DIAGNOSIS — I2510 Atherosclerotic heart disease of native coronary artery without angina pectoris: Secondary | ICD-10-CM | POA: Insufficient documentation

## 2017-09-07 DIAGNOSIS — N186 End stage renal disease: Secondary | ICD-10-CM | POA: Diagnosis not present

## 2017-09-07 DIAGNOSIS — I739 Peripheral vascular disease, unspecified: Secondary | ICD-10-CM | POA: Insufficient documentation

## 2017-09-07 LAB — BASIC METABOLIC PANEL
ANION GAP: 15 (ref 5–15)
BUN: 34 mg/dL — ABNORMAL HIGH (ref 6–20)
CALCIUM: 9 mg/dL (ref 8.9–10.3)
CO2: 27 mmol/L (ref 22–32)
CREATININE: 11.51 mg/dL — AB (ref 0.44–1.00)
Chloride: 95 mmol/L — ABNORMAL LOW (ref 101–111)
GFR, EST AFRICAN AMERICAN: 4 mL/min — AB (ref >60)
GFR, EST NON AFRICAN AMERICAN: 3 mL/min — AB (ref >60)
Glucose, Bld: 237 mg/dL — ABNORMAL HIGH (ref 65–99)
Potassium: 3.9 mmol/L (ref 3.5–5.1)
Sodium: 137 mmol/L (ref 135–145)

## 2017-09-07 LAB — CBC WITH DIFFERENTIAL/PLATELET
BASOS ABS: 0 10*3/uL (ref 0.0–0.1)
BASOS PCT: 0 %
EOS PCT: 5 %
Eosinophils Absolute: 0.4 10*3/uL (ref 0.0–0.7)
HCT: 32.7 % — ABNORMAL LOW (ref 36.0–46.0)
Hemoglobin: 10.9 g/dL — ABNORMAL LOW (ref 12.0–15.0)
Lymphocytes Relative: 15 %
Lymphs Abs: 1.2 10*3/uL (ref 0.7–4.0)
MCH: 28.5 pg (ref 26.0–34.0)
MCHC: 33.3 g/dL (ref 30.0–36.0)
MCV: 85.6 fL (ref 78.0–100.0)
MONO ABS: 0.5 10*3/uL (ref 0.1–1.0)
Monocytes Relative: 7 %
Neutro Abs: 6 10*3/uL (ref 1.7–7.7)
Neutrophils Relative %: 73 %
PLATELETS: 274 10*3/uL (ref 150–400)
RBC: 3.82 MIL/uL — ABNORMAL LOW (ref 3.87–5.11)
RDW: 14 % (ref 11.5–15.5)
WBC: 8.2 10*3/uL (ref 4.0–10.5)

## 2017-09-07 LAB — PROTIME-INR
INR: 1.09
Prothrombin Time: 14 seconds (ref 11.4–15.2)

## 2017-09-07 MED ORDER — HYDROMORPHONE HCL 2 MG/ML IJ SOLN
1.0000 mg | Freq: Once | INTRAMUSCULAR | Status: AC
  Administered 2017-09-07: 1 mg via INTRAVENOUS
  Filled 2017-09-07: qty 1

## 2017-09-07 MED ORDER — MORPHINE SULFATE (PF) 4 MG/ML IV SOLN
4.0000 mg | Freq: Once | INTRAVENOUS | Status: AC
  Administered 2017-09-07: 4 mg via INTRAVENOUS
  Filled 2017-09-07: qty 1

## 2017-09-07 MED ORDER — OXYCODONE-ACETAMINOPHEN 5-325 MG PO TABS: 1.0000 | ORAL_TABLET | Freq: Four times a day (QID) | ORAL | 0 refills | Status: DC | PRN

## 2017-09-07 MED ORDER — IOPAMIDOL (ISOVUE-370) INJECTION 76%
INTRAVENOUS | Status: AC
  Administered 2017-09-07: 100 mL
  Filled 2017-09-07: qty 100

## 2017-09-07 NOTE — Progress Notes (Signed)
Orthopedic Tech Progress Note Patient Details:  Priscilla [name] 1970/11/14 [id number]  Ortho Devices Type of Ortho Device: Crutches, CAM walker Ortho Device/Splint Location: lle Ortho Device/Splint Interventions: Ordered, Application, Adjustment   Post Interventions Patient Tolerated: Well Instructions Provided: Care of device, Adjustment of device   Karolee Stamps 09/07/2017, 8:45 PM

## 2017-09-07 NOTE — ED Triage Notes (Signed)
Patient complains of ongoing left leg that started approximately two months ago. Had ultrasound and was referred to vascular surgeon for concern of peripheral artery disease, reports pain has gotten to severe to wait for appointment in June.

## 2017-09-07 NOTE — Discharge Instructions (Addendum)
You have significant vascular disease causing your numbness and pain.   Continue taking aspirin.   Take percocet instead of vicodin for pain.   Use cam walker and crutches.   See Dr. Donzetta Matters for follow up. His office will call you on Monday for appointment   Return to ER if you have worse leg pain, numbness, weakness.

## 2017-09-07 NOTE — ED Provider Notes (Signed)
[facility] EMERGENCY DEPARTMENT Provider Note   CSN: [id number] Arrival date & time: 09/07/17  1310     History   Chief Complaint Chief Complaint  Patient presents with  . Leg Pain    HPI Priscilla [name] is a 47 y.o. female hx of DM, ESRD on peritoneal HD, HL, HTN, CABG here with L foot pain and numbness. Patient has been having left foot pain and numbness for the past several weeks that is progressively worse. She states that she has progressive trouble dorsiflexing her left foot and her foot became more numb and cold. She saw podiatrist initially and was thought to have peripheral vascular disease. She then had ABI done that was abnormal (done at [facility] and no results available in our system). She then was referred to vascular surgery and is scheduled for CTA and vascular surgery evaluation in June. Over the last several weeks, her pain and numbness was worse and she came to the ED for evaluation.   The history is provided by the patient.    Past Medical History:  Diagnosis Date  . Anemia   . Cardiomyopathy, ischemic 06/24/2014  . Diabetes mellitus without complication ([place])   . End stage renal disease ([place])   . GERD (gastroesophageal reflux disease)   . H/O kidney transplant   . Hyperlipidemia   . Hypertension   . 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 Renal disorder   . 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 S/P mitral valve repair 07/18/14    Patient Active Problem List   Diagnosis Date Noted  . History of mitral valve annuloplasty #28 Memo ring 08/02/2016  . Mixed hyperlipidemia 08/02/2016  . Elevation of cardiac enzymes   . Difficult intravenous access   . DKA (diabetic ketoacidoses) ([place]) 05/14/2016  . Microcytic anemia 05/14/2016  . Hyponatremia 05/14/2016  . Transaminitis 05/14/2016  . CHF (congestive heart failure) ([place])   . Essential hypertension 11/28/2014  . 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 Mitral regurgitation 06/29/2014  . Abnormal EKG   .  Coronary artery disease involving native coronary artery of native heart without angina pectoris   . Cardiomyopathy, ischemic 06/24/2014  . Abnormal nuclear cardiac imaging test 06/24/2014  . 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 ESRD (end stage renal disease) ([place]) 09/24/2012  . Diabetes mellitus type 1 ([place]) 09/24/2012  . 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 Pancreatitis of pancreas transplant 09/24/2012    Past Surgical History:  Procedure Laterality Date  . CAPD INSERTION N/A 09/08/2013   Procedure: Laparoscopic CAPD peritoneal dialysis catheter placement, possible lysis of adhesions   ;  Surgeon: Adin Hector, MD;  Location: [facility];  Service: General;  Laterality: N/A;  . CARDIAC CATHETERIZATION     06/2014  . CESAREAN SECTION    . COMBINED KIDNEY-PANCREAS TRANSPLANT  05/2010   [facility]..  Pancreas left mid abdomen  . CORONARY ARTERY BYPASS GRAFT N/A 07/18/2014   Procedure: CORONARY ARTERY BYPASS GRAFTING (CABG)X2 LIMA-LAD; SVG-PD;  Surgeon: Melrose Nakayama, MD;  Location: [facility];  Service: Open Heart Surgery;  Laterality: N/A;  . INSERTION OF DIALYSIS CATHETER  September 28, 2012   Right upper chest  . LAPAROSCOPIC INSERTION PERITONEAL CATHETER  07/18/2008   Dr Excell Seltzer  . LAPAROSCOPIC REPOSITIONING CAPD CATHETER  2020-10-1808   Dr Excell Seltzer  . LAPAROSCOPIC REPOSITIONING CAPD CATHETER  11/08/2008   Dr Excell Seltzer  . LAPAROSCOPIC REPOSITIONING CAPD CATHETER  10/03/2009  Unplugging CAPD catheter Dr Excell Seltzer  . LEFT HEART CATHETERIZATION WITH CORONARY ANGIOGRAM N/A 06/28/2014   Procedure: LEFT HEART CATHETERIZATION WITH CORONARY ANGIOGRAM;  Surgeon: [name], MD;  Location: [facility] CATH [facility];  Service: Cardiovascular;  Laterality: N/A;  . MITRAL VALVE REPAIR N/A 07/18/2014   Procedure: MITRAL VALVE REPAIR (MVR);  Surgeon: Melrose Nakayama, MD;  Location: [facility];  Service: Open Heart Surgery;  Laterality: N/A;  . OMENTECTOMY   11/08/2008   Dr Excell Seltzer  . TEE WITHOUT CARDIOVERSION N/A 07/18/2014   Procedure: TRANSESOPHAGEAL ECHOCARDIOGRAM (TEE);  Surgeon: Melrose Nakayama, MD;  Location: [facility];  Service: Open Heart Surgery;  Laterality: N/A;     OB History   None      Home Medications    Prior to Admission medications   Medication Sig Start Date End Date Taking? Authorizing Provider  acetaminophen (TYLENOL) 325 MG tablet Take 650 mg by mouth every 4 (four) hours as needed for mild pain.    [provider]  aspirin 325 MG tablet Take 325 mg by mouth daily.  05/31/10   [provider]  atorvastatin (LIPITOR) 20 MG tablet Take 1 tablet (20 mg total) by mouth daily. 08/02/16 10/31/16  Croitoru, Mihai, MD  calcitRIOL (ROCALTROL) 0.25 MCG capsule Take 1 capsule (0.25 mcg total) by mouth daily. Patient taking differently: Take 0.25 mcg by mouth 2 (two) times daily.  07/25/14   Nani Skillern, PA-C  calcium acetate (PHOSLO) 667 MG capsule Take 2,001 mg by mouth 3 (three) times daily with meals.    [provider]  carvedilol (COREG) 6.25 MG tablet TAKE 1/2 TABLET TWICE A DAY X 3 WEEKS THEN INCREASE TO 1 TABLET TWICE A DAY Patient taking differently: Take 6.25 mg by mouth 2 (two) times daily with a meal.  10/20/14   Croitoru, Mihai, MD  fluticasone (FLONASE) 50 MCG/ACT nasal spray Place 1 spray into both nostrils daily. 05/21/16   Nita Sells, MD  insulin glargine (LANTUS) 100 UNIT/ML injection Inject 10 Units into the skin at bedtime.     [provider]  insulin lispro (HUMALOG) 100 UNIT/ML injection Inject 10-15 Units into the skin 3 (three) times daily before meals. Per sliding scale    [provider]  IUD'S IU by Intrauterine route.    [provider]  loperamide (IMODIUM) 2 MG capsule Take 2 mg by mouth every 8 (eight) hours as needed for diarrhea or loose stools.    [provider]  losartan (COZAAR) 100 MG tablet Take 100 mg by mouth daily.     [provider]  omeprazole (PRILOSEC) 20 MG capsule Take 1 capsule (20 mg total) by mouth daily as needed. Patient taking differently: Take 20 mg by mouth daily as needed (for heartburn/reflux).  07/25/14 05/14/16  Nani Skillern, PA-C  simvastatin (ZOCOR) 20 MG tablet Take 1 tablet (20 mg total) by mouth at bedtime. PLEASE CONTACT OFFICE FOR ADDITIONAL REFILLS FINAL WARNING Patient taking differently: Take 20 mg by mouth at bedtime.  03/14/16   Croitoru, Dani Gobble, MD    Family History Family History  Problem Relation Age of Onset  . CAD Mother   . Stroke Mother   . COPD Father   . Colon cancer Neg Hx     Social History Social History   Tobacco Use  . Smoking status: Never Smoker  . Smokeless tobacco: Never Used  Substance Use Topics  . Alcohol use: No    Alcohol/week: 0.0 oz  . Drug  use: No     Allergies   Pork-derived products   Review of Systems Review of Systems  Neurological: Positive for numbness.  All other systems reviewed and are negative.    Physical Exam Updated Vital Signs BP 111/67 (BP Location: Left Arm)   Pulse 72   Temp 98 F (36.7 C)   Resp 15   SpO2 100%   Physical Exam  Constitutional: She is oriented to person, place, and time.  Chronically ill, slightly uncomfortable   HENT:  Head: Normocephalic.  Mouth/Throat: Oropharynx is clear and moist.  Eyes: Pupils are equal, round, and reactive to light. Conjunctivae and EOM are normal.  Neck: Normal range of motion. Neck supple.  Cardiovascular: Normal rate, regular rhythm and normal heart sounds.  Pulmonary/Chest: Effort normal and breath sounds normal. No stridor. No respiratory distress. She has no wheezes.  Abdominal: Soft. Bowel sounds are normal. She exhibits no distension. There is no tenderness.  Musculoskeletal: Normal range of motion.  No midline spinal tenderness   Neurological: She is alert and oriented to person, place, and time.  No saddle anesthesia. She has dec  sensation entire L foot (sock distribution). Able to dorsiflex but strength 4/5 in dorsiflexion, 5/5 plantarflexion on L leg. Nl knee flexion and extension and hip flexion and extension. Nl reflexes. 1+ pulses L DP. Good capillary refill of the toes.   Skin: Skin is warm. Capillary refill takes less than 2 seconds.  Psychiatric: She has a normal mood and affect.  Nursing note and vitals reviewed.    ED Treatments / Results  Labs (all labs ordered are listed, but only abnormal results are displayed) Labs Reviewed  CBC WITH DIFFERENTIAL/PLATELET - Abnormal; Notable for the following components:      Result Value   RBC 3.82 (*)    Hemoglobin 10.9 (*)    HCT 32.7 (*)    All other components within normal limits  BASIC METABOLIC PANEL - Abnormal; Notable for the following components:   Chloride 95 (*)    Glucose, Bld 237 (*)    BUN 34 (*)    Creatinine, Ser 11.51 (*)    GFR calc non Af Amer 3 (*)    GFR calc Af Amer 4 (*)    All other components within normal limits  PROTIME-INR    EKG None  Radiology Ct Angio Ao+bifem W & Or Wo Contrast  Result Date: 09/07/2017 CLINICAL DATA:  Increasing left leg pain, initial encounter EXAM: CT ANGIOGRAPHY OF ABDOMINAL AORTA WITH ILIOFEMORAL RUNOFF TECHNIQUE: Multidetector CT imaging of the abdomen, pelvis and lower extremities was performed using the standard protocol during bolus administration of intravenous contrast. Multiplanar CT image reconstructions and MIPs were obtained to evaluate the vascular anatomy. CONTRAST:  129mL ISOVUE-370 COMPARISON:  None. FINDINGS: VASCULAR Aorta: The abdominal aorta demonstrates scattered atherosclerotic calcifications without evidence of aneurysmal dilatation or focal stenosis. Celiac: Mild atherosclerotic calcifications are noted although the celiac axis is widely patent. SMA: Mild atherosclerotic change although widely patent. Renals: Atherosclerotic changes noted with evidence of multifocal renal artery  stenoses bilaterally consistent with the patient's known history of end-stage renal disease. IMA: Patent with atherosclerotic changes. RIGHT Lower Extremity Inflow: Diffuse arterial calcifications are identified with mild areas of narrowing. There are 2 separate arterial anastomoses related to a renal transplant in the right pelvis although both of these occludes just beyond their origin. They both arise from the external iliac artery on the right. Runoff: The right common femoral artery is patent. The superficial  femoral artery demonstrates diffuse calcification as well as multifocal moderate to high-grade stenoses throughout its course. These changes extend into the popliteal artery and subsequently into the popliteal trifurcation vessels. Again heavy calcification is noted. The posterior tibial and peroneal arteries demonstrate diffuse calcifications with multifocal areas of narrowing. The dominant runoff vessel is via the anterior tibial artery extending into the foot. LEFT Lower Extremity Inflow: Focal moderate to high-grade stenosis of the proximal left common iliac artery is noted. Multifocal mild narrowing is noted within the external iliac artery as well as the common femoral artery. Runoff: The superficial femoral artery demonstrates significant calcifications similar to that seen on the right again with multifocal areas of moderate to high-grade stenoses throughout the superficial femoral artery. These changes extend into the popliteal artery similar to that seen on the right side. Popliteal trifurcation is patent although short segment occlusion of the posterior tibial artery is noted just beyond its origin. Heavy calcifications are identified throughout the infrapopliteal vessels with single vessel runoff into the left foot via the anterior tibial artery. No significant runoff via the posterior tibial artery is noted. The peroneal artery demonstrates multifocal stenoses but appears patent to the level  of the ankle. Veins: No definitive venous abnormality is noted. Review of the MIP images confirms the above findings. NON-VASCULAR Lower chest: Mild lingular and left lower lobe infiltrate is seen with associated small effusion. The right lung base is clear. Hepatobiliary: No focal liver abnormality is seen. No gallstones, gallbladder wall thickening, or biliary dilatation. Pancreas: Unremarkable. No pancreatic ductal dilatation or surrounding inflammatory changes. Spleen: Normal in size without focal abnormality. Adrenals/Urinary Tract: Kidneys are small and shrunken consistent with the known history of end-stage renal disease. Changes of prior failed renal transplant in the right pelvis are noted. Stomach/Bowel: No obstructive or inflammatory changes are identified. The appendix is not well appreciated on this exam. Diffuse visceral vessel calcifications are identified. Lymphatic: No significant lymphadenopathy is noted. Reproductive: Diffuse calcification of the uterine vessels is noted. The uterus is otherwise within normal limits. An IUD is noted in place. Other: Peritoneal dialysis catheter is noted within the pelvis. A small amount of associated free fluid is noted consistent with peritoneal dialysate. Few small fat containing anterior abdominal wall hernias are noted in the midline superiorly. These may be related to prior abdominal surgeries. Musculoskeletal: No acute bony abnormality is noted. IMPRESSION: VASCULAR Diffuse arterial calcifications within the arterial tree throughout the abdomen pelvis and bilateral lower extremities most consistent with the known history of end-stage renal disease. Multifocal areas of moderate to high-grade stenosis within the superficial femoral arteries bilaterally. Single-vessel runoff via the anterior tibial arteries is noted bilaterally. NON-VASCULAR Changes of prior right pelvic renal transplant which have failed with significant calcification of the transplant kidney  and occlusion of the arterial inflow. Peritoneal dialysis catheter as described with a small amount of ascites. Lingular and left lower lobe infiltrate with associated small effusion. Small fat containing anterior superior abdominal wall hernias related to prior surgery. IUD in place. Electronically Signed   By: Inez Catalina M.D.   On: 09/07/2017 18:39    Procedures Procedures (including critical care time)  Medications Ordered in ED Medications  morphine 4 MG/ML injection 4 mg (4 mg Intravenous Given 09/07/17 1551)  HYDROmorphone (DILAUDID) injection 1 mg (1 mg Intravenous Given 09/07/17 1650)  iopamidol (ISOVUE-370) 76 % injection (100 mLs  Contrast Given 09/07/17 1722)     Initial Impression / Assessment and Plan / ED Course  I have reviewed the triage vital signs and the nursing notes.  Pertinent labs & imaging results that were available during my care of the patient were reviewed by me and considered in my medical decision making (see chart for details).    Priscilla [name] is a 47 y.o. female here with decreased sensation and weakness of L foot. Has hx of PVD and suppose to get CTA and vascular evaluation. Patient is on dialysis currently and doesn't make urine. She does have palpable pulses L DP so I don't think she needs emergent vascular intervention. I think likely claudication. I doubt spinal compression causing her symptoms. Will get labs, CTA aortobifemoral. Will call vascular for follow up.    8:01 PM CTA showed multifocal areas of moderate to high grade stenosis of the superficial femoral arteries but there is normal runoff. Pain controlled. I called Dr. Donzetta Matters from vascular. He recommend ASA, which she is on. His office will call her for appointment. Will switch her to percocet.   Final Clinical Impressions(s) / ED Diagnoses   Final diagnoses:  None    ED Discharge Orders    None       Drenda Freeze, MD 09/07/17 2008

## 2017-09-08 ENCOUNTER — Telehealth: Payer: Self-pay | Admitting: Vascular Surgery

## 2017-09-08 NOTE — Telephone Encounter (Signed)
-----   Message from Penni Homans, RN sent at 09/08/2017 10:04 AM EDT ----- Regarding: Appointment   ----- Message ----- From: Waynetta Sandy, MD Sent: 09/07/2017   7:46 PM To: [facility] Charge Pool  Marthe [name] [id number] 11/30/1970  Patient has appointment with [facility] in June but needs to be evaluated prior to that. Her pcp had requested [facility] but if he is not available then she needs to be seen by first available.   Erlene Quan

## 2017-09-08 NOTE — Telephone Encounter (Signed)
Resched lab 09/17/17 at 11:00 and MD 09/18/17 at 3:00. Spoke to pt to inform them of appts.

## 2017-09-12 ENCOUNTER — Telehealth: Payer: Self-pay

## 2017-09-12 ENCOUNTER — Telehealth: Payer: Self-pay | Admitting: Cardiology

## 2017-09-12 NOTE — Telephone Encounter (Signed)
Faxed notes to [facility]

## 2017-09-12 NOTE — Telephone Encounter (Signed)
Received Note from [facility] on 09/12/17, Appt 09/25/17 @ 10:00AM. [place]

## 2017-09-17 ENCOUNTER — Ambulatory Visit (HOSPITAL_COMMUNITY)
Admission: RE | Admit: 2017-09-17 | Discharge: 2017-09-17 | Disposition: A | Discharge status: Home / Self Care | Payer: Medicare Other | Source: Ambulatory Visit | Attending: Vascular Surgery | Admitting: Vascular Surgery

## 2017-09-17 DIAGNOSIS — R6889 Other general symptoms and signs: Secondary | ICD-10-CM | POA: Diagnosis not present

## 2017-09-17 DIAGNOSIS — I739 Peripheral vascular disease, unspecified: Secondary | ICD-10-CM

## 2017-09-17 DIAGNOSIS — E785 Hyperlipidemia, unspecified: Secondary | ICD-10-CM | POA: Diagnosis not present

## 2017-09-17 DIAGNOSIS — E1159 Type 2 diabetes mellitus with other circulatory complications: Secondary | ICD-10-CM | POA: Diagnosis not present

## 2017-09-17 DIAGNOSIS — I10 Essential (primary) hypertension: Secondary | ICD-10-CM | POA: Diagnosis not present

## 2017-09-17 DIAGNOSIS — Z951 Presence of aortocoronary bypass graft: Secondary | ICD-10-CM | POA: Insufficient documentation

## 2017-09-17 DIAGNOSIS — I2510 Atherosclerotic heart disease of native coronary artery without angina pectoris: Secondary | ICD-10-CM | POA: Insufficient documentation

## 2017-09-18 ENCOUNTER — Encounter

## 2017-09-18 ENCOUNTER — Other Ambulatory Visit: Payer: Self-pay

## 2017-09-18 ENCOUNTER — Other Ambulatory Visit: Payer: Self-pay | Admitting: *Deleted

## 2017-09-18 ENCOUNTER — Encounter: Payer: Self-pay | Admitting: *Deleted

## 2017-09-18 ENCOUNTER — Ambulatory Visit (INDEPENDENT_AMBULATORY_CARE_PROVIDER_SITE_OTHER): Payer: Medicare Other | Admitting: Vascular Surgery

## 2017-09-18 ENCOUNTER — Encounter: Payer: Self-pay | Admitting: Vascular Surgery

## 2017-09-18 VITALS — BP 138/77 | HR 71 | Resp 18 | Ht 60.0 in | Wt 122.6 lb

## 2017-09-18 DIAGNOSIS — I70229 Atherosclerosis of native arteries of extremities with rest pain, unspecified extremity: Secondary | ICD-10-CM

## 2017-09-18 NOTE — H&P (View-Only) (Signed)
Patient name: Priscilla [name] MRN: [id number] DOB: 09-Jan-1971 Sex: female   REASON FOR CONSULT:    Peripheral vascular disease.  The consult is requested by Dr. Lennice Sites.  HPI:   Priscilla [name] is a pleasant 47 y.o. female, who was referred for evaluation of peripheral vascular disease.  I have reviewed the records that were sent from the referring office.  The patient was seen on 08/26/2017.  The patient was evaluated with left lower extremity pain, weakness, and numbness.  The patient's symptoms progressed and apparently she went to the emergency department complaining of significant left leg pain.  She had a CT angiogram performed there and the results was discussed with Dr. Donnetta Hutching.  She was set up for an office visit.  The patient describes significant claudication bilaterally that involves her hips thighs and calves.  Her symptoms are more significant on the left side.  Her symptoms are brought on by ambulation and relieved with rest.  They have been gradually progressive.  She has now developed rest pain in the left foot.  She also had a small blister on the posterior aspect of her left heel and has a small wound here.  Her risk factors for peripheral vascular disease include diabetes, hypertension, and hypercholesterolemia.  She denies any family history of premature cardiovascular disease or history of tobacco use.  Of note, this patient has a significant past medical history in that she has a history of ischemic cardiomyopathy, she has undergone previous coronary revascularization in 2016 (vein taken from the right leg), she has a failed pancreatic and kidney transplant from 2012, and she does have a history of congestive heart failure.  She had a myocardial infarction in 2015 prior to her CABG.  Her cardiologist is Dr.Croitoru.   She has end-stage renal disease and does peritoneal dialysis.  Her nephrologist is Dr. Joelyn Oms.  Past Medical History:  Diagnosis Date  .  Anemia   . CAD (coronary artery disease)   . Cardiomyopathy, ischemic 06/24/2014  . Diabetes mellitus without complication ([place])   . End stage renal disease ([place])   . GERD (gastroesophageal reflux disease)   . H/O kidney transplant   . Hyperlipidemia   . Hypertension   . Myocardial infarction ([place])   . Pancreas transplanted ([place])   . Renal disorder   . 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 S/P mitral valve repair 07/18/14    Family History  Problem Relation Age of Onset  . CAD Mother   . Stroke Mother   . COPD Father   . Colon cancer Neg Hx     SOCIAL HISTORY: Social History   Socioeconomic History  . Marital status: Divorced    Spouse name: Not on file  . Number of children: 1  . Years of education: Not on file  . Highest education level: Not on file  Occupational History  . 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 Financial resource strain: Not on file  . Food insecurity:    Worry: Not on file    Inability: Not on file  . Transportation needs:    Medical: Not on file    Non-medical: Not on file  Tobacco Use  . Smoking status: Never Smoker  . Smokeless tobacco: Never Used  Substance and Sexual Activity  . Alcohol use: No    Alcohol/week: 0.0 oz  . Drug use: No  . Sexual activity: Not Currently    Birth control/protection: None  Lifestyle  . Physical  activity:    Days per week: Not on file    Minutes per session: Not on file  . Stress: Not on file  Relationships  . Social connections:    Talks on phone: Not on file    Gets together: Not on file    Attends religious service: Not on file    Active member of club or organization: Not on file    Attends meetings of clubs or organizations: Not on file    Relationship status: Not on file  . Intimate partner violence:    Fear of current or ex partner: Not on file    Emotionally abused: Not on file    Physically abused: Not on file    Forced sexual activity: Not on file  Other Topics Concern  . Not on  file  Social History Narrative  . Not on file    Allergies  Allergen Reactions  . Pork-Derived Products Other (See Comments)    Pt is Muslum    Current Outpatient Medications  Medication Sig Dispense Refill  . acetaminophen (TYLENOL) 325 MG tablet Take 650 mg by mouth every 4 (four) hours as needed for mild pain.    Marland Kitchen aspirin 325 MG tablet Take 325 mg by mouth daily.     . calcitRIOL (ROCALTROL) 0.25 MCG capsule Take 1 capsule (0.25 mcg total) by mouth daily. (Patient taking differently: Take 0.25 mcg by mouth 2 (two) times daily. ) 30 capsule 1  . calcium acetate (PHOSLO) 667 MG capsule Take 2,001 mg by mouth 3 (three) times daily with meals.    . carvedilol (COREG) 6.25 MG tablet TAKE 1/2 TABLET TWICE A DAY X 3 WEEKS THEN INCREASE TO 1 TABLET TWICE A DAY (Patient taking differently: Take 6.25 mg by mouth 2 (two) times daily with a meal. ) 60 tablet 6  . fluticasone (FLONASE) 50 MCG/ACT nasal spray Place 1 spray into both nostrils daily. 16 g 2  . Insulin Degludec (TRESIBA FLEXTOUCH [place]) Inject into the skin.    Marland Kitchen insulin lispro (HUMALOG) 100 UNIT/ML injection Inject 10-15 Units into the skin 3 (three) times daily before meals. Per sliding scale    . IUD'S IU by Intrauterine route.    . loperamide (IMODIUM) 2 MG capsule Take 2 mg by mouth every 8 (eight) hours as needed for diarrhea or loose stools.    Marland Kitchen losartan (COZAAR) 100 MG tablet Take 100 mg by mouth daily.    Marland Kitchen oxyCODONE-acetaminophen (PERCOCET) 5-325 MG tablet Take 1 tablet by mouth every 6 (six) hours as needed. 10 tablet 0  . atorvastatin (LIPITOR) 20 MG tablet Take 1 tablet (20 mg total) by mouth daily. 90 tablet 3  . insulin glargine (LANTUS) 100 UNIT/ML injection Inject 10 Units into the skin at bedtime.     Marland Kitchen omeprazole (PRILOSEC) 20 MG capsule Take 1 capsule (20 mg total) by mouth daily as needed. (Patient taking differently: Take 20 mg by mouth daily as needed (for heartburn/reflux). ) 30 capsule   . simvastatin (ZOCOR) 20  MG tablet Take 1 tablet (20 mg total) by mouth at bedtime. PLEASE CONTACT OFFICE FOR ADDITIONAL REFILLS FINAL WARNING (Patient not taking: Reported on 09/18/2017) 30 tablet 0   No current facility-administered medications for this visit.     REVIEW OF SYSTEMS:  [X]  denotes positive finding, [ ]  denotes negative finding Cardiac  Comments:  Chest pain or chest pressure:    Shortness of breath upon exertion:    Short of breath when lying  flat:    Irregular heart rhythm:        Vascular    Pain in calf, thigh, or hip brought on by ambulation: x   Pain in feet at night that wakes you up from your sleep:  x   Blood clot in your veins:    Leg swelling:         Pulmonary    Oxygen at home:    Productive cough:     Wheezing:         Neurologic    Sudden weakness in arms or legs:     Sudden numbness in arms or legs:     Sudden onset of difficulty speaking or slurred speech:    Temporary loss of vision in one eye:     Problems with dizziness:         Gastrointestinal    Blood in stool:     Vomited blood:         Genitourinary    Burning when urinating:     Blood in urine:        Psychiatric    Major depression:         Hematologic    Bleeding problems:    Problems with blood clotting too easily:        Skin    Rashes or ulcers:        Constitutional    Fever or chills:     PHYSICAL EXAM:   Vitals:   09/18/17 1503  BP: 138/77  Pulse: 71  Resp: 18  SpO2: 98%  Weight: 122 lb 9.6 oz (55.6 kg)  Height: 5' (1.524 m)   GENERAL: The patient is a well-nourished female, in no acute distress. The vital signs are documented above. CARDIAC: There is a regular rate and rhythm.  VASCULAR: I do not detect carotid bruits. I cannot palpate femoral, popliteal, or pedal pulses on either side. She has a small wound on her left heel where she had a blister. She has mild bilateral lower extremity swelling. PULMONARY: There is good air exchange bilaterally without wheezing or  rales. ABDOMEN: Soft and non-tender with normal pitched bowel sounds.  I do not palpate an abdominal aortic aneurysm. MUSCULOSKELETAL: There are no major deformities or cyanosis. NEUROLOGIC: No focal weakness or paresthesias are detected. SKIN: She has a small wound on her left heel. PSYCHIATRIC: The patient has a normal affect.  DATA:    ARTERIAL DOPPLER STUDY: I have independently interpreted the arterial Doppler study today.  On the right side there is a biphasic dorsalis pedis signal.  The posterior tibial signal is not audible.  ABI on the right is 100%.  Toe pressure is 255 mmHg.  On the left side there is a dampened monophasic dorsalis pedis signal.  ABI is 100%.  Toe pressure is 255 mmHg.  I do not think the toe pressures or ABIs are accurate as the patient has severe calcific disease making these falsely elevated.  CT ANGIOGRAM: I reviewed the CT angiogram of the aorta with runoff that was done on 09/07/2017.  This shows multilevel arterial disease.  There is diffuse arterial calcification in the aortoiliac segments and also in the infrainguinal segments bilaterally.  There are multifocal areas of moderate to high-grade stenosis within the superficial femoral arteries bilaterally.  There is single-vessel runoff via the anterior tibial arteries bilaterally.  By my review the films she has a significant proximal left common iliac artery stenosis.  MEDICAL ISSUES:   CRITICAL  LIMB ISCHEMIA LEFT LOWER EXTREMITY: This patient has critical limb ischemia of the left lower extremity with rest pain on the left and a wound on her left heel.  Given her multilevel arterial occlusive disease and diabetes this is clearly a limb threatening problem.  I cannot palpate femoral pulses and she also has significant infrainguinal arterial occlusive disease.  She has severe calcific disease diffusely which certainly complicates the situation and puts her at much higher risk for intervention.  I have  recommended that we proceed with arteriography to evaluate our options.  I cannot palpate femoral pulses on either side although she has a reasonable femoral signal on the right with the Doppler.  Therefore I think I can potentially stick her right groin.  I cannot palpate radial pulses on either side she does have a brachial pulse on the left however her arteries are very small and I do not think she is a good candidate for brachial catheterization.  I have explained that we may not be able to accomplish arteriography but I think it is worth trying.  If she has disease amenable to angioplasty in the iliac arteries then certainly we could consider this.  However this would be associated with increased risk given her diffuse calcific disease which puts her at increased risk for dissection or significant arterial injury.  Her arteriogram is scheduled for tomorrow and I will make further recommendations pending these results.  If she is to be considered for surgery she would be high risk given her multiple medical comorbidities including significant cardiac disease and she would need preoperative cardiac evaluation.  Her diffuse calcific disease also complicates her surgical options.  [name] [facility] [phone]

## 2017-09-18 NOTE — Progress Notes (Signed)
I have not seen her in a long while, but will gladly help if we need to consider surgery. Priscilla [name], can we please bring her in? Maybe w APP on day I'm there? MCr

## 2017-09-18 NOTE — Progress Notes (Signed)
Patient name: Priscilla [name] MRN: [id number] DOB: [birth year] Sex: female   REASON FOR CONSULT:    Peripheral vascular disease.  The consult is requested by Dr. Lennice Sites.  HPI:   Priscilla [name] is a pleasant 47 y.o. female, who was referred for evaluation of peripheral vascular disease.  I have reviewed the records that were sent from the referring office.  The patient was seen on 08/26/2017.  The patient was evaluated with left lower extremity pain, weakness, and numbness.  The patient's symptoms progressed and apparently she went to the emergency department complaining of significant left leg pain.  She had a CT angiogram performed there and the results was discussed with Dr. Donnetta Hutching.  She was set up for an office visit.  The patient describes significant claudication bilaterally that involves her hips thighs and calves.  Her symptoms are more significant on the left side.  Her symptoms are brought on by ambulation and relieved with rest.  They have been gradually progressive.  She has now developed rest pain in the left foot.  She also had a small blister on the posterior aspect of her left heel and has a small wound here.  Her risk factors for peripheral vascular disease include diabetes, hypertension, and hypercholesterolemia.  She denies any family history of premature cardiovascular disease or history of tobacco use.  Of note, this patient has a significant past medical history in that she has a history of ischemic cardiomyopathy, she has undergone previous coronary revascularization in 2016 (vein taken from the right leg), she has a failed pancreatic and kidney transplant from 2012, and she does have a history of congestive heart failure.  She had a myocardial infarction in 2015 prior to her CABG.  Her cardiologist is Dr.Croitoru.   She has end-stage renal disease and does peritoneal dialysis.  Her nephrologist is Dr. Joelyn Oms.  Past Medical History:  Diagnosis Date  .  Anemia   . CAD (coronary artery disease)   . Cardiomyopathy, ischemic 06/24/2014  . Diabetes mellitus without complication ([place])   . End stage renal disease ([place])   . GERD (gastroesophageal reflux disease)   . H/O kidney transplant   . Hyperlipidemia   . Hypertension   . Myocardial infarction ([place])   . Pancreas transplanted ([place])   . Renal disorder   . 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 S/P mitral valve repair 07/18/14    Family History  Problem Relation Age of Onset  . CAD Mother   . Stroke Mother   . COPD Father   . Colon cancer Neg Hx     SOCIAL HISTORY: Social History   Socioeconomic History  . Marital status: Divorced    Spouse name: Not on file  . Number of children: 1  . Years of education: Not on file  . Highest education level: Not on file  Occupational History  . 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 Financial resource strain: Not on file  . Food insecurity:    Worry: Not on file    Inability: Not on file  . Transportation needs:    Medical: Not on file    Non-medical: Not on file  Tobacco Use  . Smoking status: Never Smoker  . Smokeless tobacco: Never Used  Substance and Sexual Activity  . Alcohol use: No    Alcohol/week: 0.0 oz  . Drug use: No  . Sexual activity: Not Currently    Birth control/protection: None  Lifestyle  . Physical  activity:    Days per week: Not on file    Minutes per session: Not on file  . Stress: Not on file  Relationships  . Social connections:    Talks on phone: Not on file    Gets together: Not on file    Attends religious service: Not on file    Active member of club or organization: Not on file    Attends meetings of clubs or organizations: Not on file    Relationship status: Not on file  . Intimate partner violence:    Fear of current or ex partner: Not on file    Emotionally abused: Not on file    Physically abused: Not on file    Forced sexual activity: Not on file  Other Topics Concern  . Not on  file  Social History Narrative  . Not on file    Allergies  Allergen Reactions  . Pork-Derived Products Other (See Comments)    Pt is Muslum    Current Outpatient Medications  Medication Sig Dispense Refill  . acetaminophen (TYLENOL) 325 MG tablet Take 650 mg by mouth every 4 (four) hours as needed for mild pain.    Marland Kitchen aspirin 325 MG tablet Take 325 mg by mouth daily.     . calcitRIOL (ROCALTROL) 0.25 MCG capsule Take 1 capsule (0.25 mcg total) by mouth daily. (Patient taking differently: Take 0.25 mcg by mouth 2 (two) times daily. ) 30 capsule 1  . calcium acetate (PHOSLO) 667 MG capsule Take 2,001 mg by mouth 3 (three) times daily with meals.    . carvedilol (COREG) 6.25 MG tablet TAKE 1/2 TABLET TWICE A DAY X 3 WEEKS THEN INCREASE TO 1 TABLET TWICE A DAY (Patient taking differently: Take 6.25 mg by mouth 2 (two) times daily with a meal. ) 60 tablet 6  . fluticasone (FLONASE) 50 MCG/ACT nasal spray Place 1 spray into both nostrils daily. 16 g 2  . Insulin Degludec (TRESIBA FLEXTOUCH [place]) Inject into the skin.    Marland Kitchen insulin lispro (HUMALOG) 100 UNIT/ML injection Inject 10-15 Units into the skin 3 (three) times daily before meals. Per sliding scale    . IUD'S IU by Intrauterine route.    . loperamide (IMODIUM) 2 MG capsule Take 2 mg by mouth every 8 (eight) hours as needed for diarrhea or loose stools.    Marland Kitchen losartan (COZAAR) 100 MG tablet Take 100 mg by mouth daily.    Marland Kitchen oxyCODONE-acetaminophen (PERCOCET) 5-325 MG tablet Take 1 tablet by mouth every 6 (six) hours as needed. 10 tablet 0  . atorvastatin (LIPITOR) 20 MG tablet Take 1 tablet (20 mg total) by mouth daily. 90 tablet 3  . insulin glargine (LANTUS) 100 UNIT/ML injection Inject 10 Units into the skin at bedtime.     Marland Kitchen omeprazole (PRILOSEC) 20 MG capsule Take 1 capsule (20 mg total) by mouth daily as needed. (Patient taking differently: Take 20 mg by mouth daily as needed (for heartburn/reflux). ) 30 capsule   . simvastatin (ZOCOR) 20  MG tablet Take 1 tablet (20 mg total) by mouth at bedtime. PLEASE CONTACT OFFICE FOR ADDITIONAL REFILLS FINAL WARNING (Patient not taking: Reported on 09/18/2017) 30 tablet 0   No current facility-administered medications for this visit.     REVIEW OF SYSTEMS:  [X]  denotes positive finding, [ ]  denotes negative finding Cardiac  Comments:  Chest pain or chest pressure:    Shortness of breath upon exertion:    Short of breath when lying  flat:    Irregular heart rhythm:        Vascular    Pain in calf, thigh, or hip brought on by ambulation: x   Pain in feet at night that wakes you up from your sleep:  x   Blood clot in your veins:    Leg swelling:         Pulmonary    Oxygen at home:    Productive cough:     Wheezing:         Neurologic    Sudden weakness in arms or legs:     Sudden numbness in arms or legs:     Sudden onset of difficulty speaking or slurred speech:    Temporary loss of vision in one eye:     Problems with dizziness:         Gastrointestinal    Blood in stool:     Vomited blood:         Genitourinary    Burning when urinating:     Blood in urine:        Psychiatric    Major depression:         Hematologic    Bleeding problems:    Problems with blood clotting too easily:        Skin    Rashes or ulcers:        Constitutional    Fever or chills:     PHYSICAL EXAM:   Vitals:   09/18/17 1503  BP: 138/77  Pulse: 71  Resp: 18  SpO2: 98%  Weight: 122 lb 9.6 oz (55.6 kg)  Height: 5' (1.524 m)   GENERAL: The patient is a well-nourished female, in no acute distress. The vital signs are documented above. CARDIAC: There is a regular rate and rhythm.  VASCULAR: I do not detect carotid bruits. I cannot palpate femoral, popliteal, or pedal pulses on either side. She has a small wound on her left heel where she had a blister. She has mild bilateral lower extremity swelling. PULMONARY: There is good air exchange bilaterally without wheezing or  rales. ABDOMEN: Soft and non-tender with normal pitched bowel sounds.  I do not palpate an abdominal aortic aneurysm. MUSCULOSKELETAL: There are no major deformities or cyanosis. NEUROLOGIC: No focal weakness or paresthesias are detected. SKIN: She has a small wound on her left heel. PSYCHIATRIC: The patient has a normal affect.  DATA:    ARTERIAL DOPPLER STUDY: I have independently interpreted the arterial Doppler study today.  On the right side there is a biphasic dorsalis pedis signal.  The posterior tibial signal is not audible.  ABI on the right is 100%.  Toe pressure is 255 mmHg.  On the left side there is a dampened monophasic dorsalis pedis signal.  ABI is 100%.  Toe pressure is 255 mmHg.  I do not think the toe pressures or ABIs are accurate as the patient has severe calcific disease making these falsely elevated.  CT ANGIOGRAM: I reviewed the CT angiogram of the aorta with runoff that was done on 09/07/2017.  This shows multilevel arterial disease.  There is diffuse arterial calcification in the aortoiliac segments and also in the infrainguinal segments bilaterally.  There are multifocal areas of moderate to high-grade stenosis within the superficial femoral arteries bilaterally.  There is single-vessel runoff via the anterior tibial arteries bilaterally.  By my review the films she has a significant proximal left common iliac artery stenosis.  MEDICAL ISSUES:   CRITICAL  LIMB ISCHEMIA LEFT LOWER EXTREMITY: This patient has critical limb ischemia of the left lower extremity with rest pain on the left and a wound on her left heel.  Given her multilevel arterial occlusive disease and diabetes this is clearly a limb threatening problem.  I cannot palpate femoral pulses and she also has significant infrainguinal arterial occlusive disease.  She has severe calcific disease diffusely which certainly complicates the situation and puts her at much higher risk for intervention.  I have  recommended that we proceed with arteriography to evaluate our options.  I cannot palpate femoral pulses on either side although she has a reasonable femoral signal on the right with the Doppler.  Therefore I think I can potentially stick her right groin.  I cannot palpate radial pulses on either side she does have a brachial pulse on the left however her arteries are very small and I do not think she is a good candidate for brachial catheterization.  I have explained that we may not be able to accomplish arteriography but I think it is worth trying.  If she has disease amenable to angioplasty in the iliac arteries then certainly we could consider this.  However this would be associated with increased risk given her diffuse calcific disease which puts her at increased risk for dissection or significant arterial injury.  Her arteriogram is scheduled for tomorrow and I will make further recommendations pending these results.  If she is to be considered for surgery she would be high risk given her multiple medical comorbidities including significant cardiac disease and she would need preoperative cardiac evaluation.  Her diffuse calcific disease also complicates her surgical options.  [name] [facility] [phone]

## 2017-09-19 ENCOUNTER — Other Ambulatory Visit: Payer: Self-pay

## 2017-09-19 ENCOUNTER — Ambulatory Visit (HOSPITAL_COMMUNITY)
Admission: RE | Disposition: A | Discharge status: Home / Self Care | Payer: Self-pay | Source: Ambulatory Visit | Attending: Vascular Surgery

## 2017-09-19 ENCOUNTER — Ambulatory Visit (HOSPITAL_COMMUNITY)
Admission: RE | Admit: 2017-09-19 | Discharge: 2017-09-19 | Disposition: A | Discharge status: Home / Self Care | Payer: Medicare Other | Source: Ambulatory Visit | Attending: Vascular Surgery | Admitting: Vascular Surgery

## 2017-09-19 DIAGNOSIS — E1122 Type 2 diabetes mellitus with diabetic chronic kidney disease: Secondary | ICD-10-CM | POA: Insufficient documentation

## 2017-09-19 DIAGNOSIS — Z794 Long term (current) use of insulin: Secondary | ICD-10-CM | POA: Insufficient documentation

## 2017-09-19 DIAGNOSIS — I771 Stricture of artery: Secondary | ICD-10-CM | POA: Diagnosis not present

## 2017-09-19 DIAGNOSIS — Z79899 Other long term (current) drug therapy: Secondary | ICD-10-CM | POA: Diagnosis not present

## 2017-09-19 DIAGNOSIS — Z940 Kidney transplant status: Secondary | ICD-10-CM | POA: Diagnosis not present

## 2017-09-19 DIAGNOSIS — Z91018 Allergy to other foods: Secondary | ICD-10-CM | POA: Insufficient documentation

## 2017-09-19 DIAGNOSIS — Z7982 Long term (current) use of aspirin: Secondary | ICD-10-CM | POA: Diagnosis not present

## 2017-09-19 DIAGNOSIS — I70222 Atherosclerosis of native arteries of extremities with rest pain, left leg: Secondary | ICD-10-CM | POA: Insufficient documentation

## 2017-09-19 DIAGNOSIS — Z8249 Family history of ischemic heart disease and other diseases of the circulatory system: Secondary | ICD-10-CM | POA: Diagnosis not present

## 2017-09-19 DIAGNOSIS — Z9483 Pancreas transplant status: Secondary | ICD-10-CM | POA: Insufficient documentation

## 2017-09-19 DIAGNOSIS — I998 Other disorder of circulatory system: Secondary | ICD-10-CM | POA: Diagnosis present

## 2017-09-19 DIAGNOSIS — I132 Hypertensive heart and chronic kidney disease with heart failure and with stage 5 chronic kidney disease, or end stage renal disease: Secondary | ICD-10-CM | POA: Diagnosis not present

## 2017-09-19 DIAGNOSIS — Z951 Presence of aortocoronary bypass graft: Secondary | ICD-10-CM | POA: Diagnosis not present

## 2017-09-19 DIAGNOSIS — N186 End stage renal disease: Secondary | ICD-10-CM | POA: Diagnosis not present

## 2017-09-19 DIAGNOSIS — I255 Ischemic cardiomyopathy: Secondary | ICD-10-CM | POA: Insufficient documentation

## 2017-09-19 DIAGNOSIS — K219 Gastro-esophageal reflux disease without esophagitis: Secondary | ICD-10-CM | POA: Insufficient documentation

## 2017-09-19 DIAGNOSIS — Z992 Dependence on renal dialysis: Secondary | ICD-10-CM | POA: Insufficient documentation

## 2017-09-19 DIAGNOSIS — Z823 Family history of stroke: Secondary | ICD-10-CM | POA: Insufficient documentation

## 2017-09-19 DIAGNOSIS — I252 Old myocardial infarction: Secondary | ICD-10-CM | POA: Diagnosis not present

## 2017-09-19 DIAGNOSIS — E785 Hyperlipidemia, unspecified: Secondary | ICD-10-CM | POA: Diagnosis not present

## 2017-09-19 HISTORY — PX: ABDOMINAL AORTOGRAM W/LOWER EXTREMITY: CATH118223

## 2017-09-19 HISTORY — PX: PERIPHERAL VASCULAR INTERVENTION: CATH118257

## 2017-09-19 LAB — POCT I-STAT, CHEM 8
BUN: 42 mg/dL — ABNORMAL HIGH (ref 6–20)
CHLORIDE: 96 mmol/L — AB (ref 101–111)
CREATININE: 11.5 mg/dL — AB (ref 0.44–1.00)
Calcium, Ion: 1.07 mmol/L — ABNORMAL LOW (ref 1.15–1.40)
GLUCOSE: 165 mg/dL — AB (ref 65–99)
HCT: 31 % — ABNORMAL LOW (ref 36.0–46.0)
HEMOGLOBIN: 10.5 g/dL — AB (ref 12.0–15.0)
Potassium: 5 mmol/L (ref 3.5–5.1)
Sodium: 138 mmol/L (ref 135–145)
TCO2: 30 mmol/L (ref 22–32)

## 2017-09-19 LAB — POCT ACTIVATED CLOTTING TIME
ACTIVATED CLOTTING TIME: 208 s
ACTIVATED CLOTTING TIME: 186 s

## 2017-09-19 LAB — GLUCOSE, CAPILLARY
GLUCOSE-CAPILLARY: 168 mg/dL — AB (ref 65–99)
Glucose-Capillary: 194 mg/dL — ABNORMAL HIGH (ref 65–99)
Glucose-Capillary: 191 mg/dL — ABNORMAL HIGH (ref 65–99)

## 2017-09-19 LAB — HCG, SERUM, QUALITATIVE: Preg, Serum: NEGATIVE

## 2017-09-19 SURGERY — ABDOMINAL AORTOGRAM W/LOWER EXTREMITY
Anesthesia: LOCAL

## 2017-09-19 MED ORDER — FENTANYL CITRATE (PF) 100 MCG/2ML IJ SOLN
INTRAMUSCULAR | Status: DC | PRN
  Administered 2017-09-19: 50 ug via INTRAVENOUS

## 2017-09-19 MED ORDER — SODIUM CHLORIDE 0.9% FLUSH: 3.0000 mL | Freq: Two times a day (BID) | INTRAVENOUS | Status: DC

## 2017-09-19 MED ORDER — CLOPIDOGREL BISULFATE 300 MG PO TABS
ORAL_TABLET | ORAL | Status: DC | PRN
  Administered 2017-09-19: 300 mg via ORAL

## 2017-09-19 MED ORDER — FENTANYL CITRATE (PF) 100 MCG/2ML IJ SOLN
INTRAMUSCULAR | Status: AC
  Filled 2017-09-19: qty 2

## 2017-09-19 MED ORDER — HEPARIN SODIUM (PORCINE) 1000 UNIT/ML IJ SOLN
INTRAMUSCULAR | Status: AC
  Filled 2017-09-19: qty 1

## 2017-09-19 MED ORDER — HYDRALAZINE HCL 20 MG/ML IJ SOLN: 5.0000 mg | INTRAMUSCULAR | Status: DC | PRN

## 2017-09-19 MED ORDER — OXYCODONE-ACETAMINOPHEN 5-325 MG PO TABS
1.0000 | ORAL_TABLET | ORAL | Status: DC | PRN
  Administered 2017-09-19: 2 via ORAL

## 2017-09-19 MED ORDER — HEPARIN (PORCINE) IN NACL 1000-0.9 UT/500ML-% IV SOLN
INTRAVENOUS | Status: AC
  Filled 2017-09-19: qty 500

## 2017-09-19 MED ORDER — SODIUM CHLORIDE 0.9 % IV SOLN: 250.0000 mL | INTRAVENOUS | Status: DC | PRN

## 2017-09-19 MED ORDER — HEPARIN SODIUM (PORCINE) 1000 UNIT/ML IJ SOLN
INTRAMUSCULAR | Status: DC | PRN
  Administered 2017-09-19: 5000 [IU] via INTRAVENOUS

## 2017-09-19 MED ORDER — OXYCODONE-ACETAMINOPHEN 5-325 MG PO TABS
ORAL_TABLET | ORAL | Status: AC
  Administered 2017-09-19: 2 via ORAL
  Filled 2017-09-19: qty 2

## 2017-09-19 MED ORDER — MIDAZOLAM HCL 2 MG/2ML IJ SOLN
INTRAMUSCULAR | Status: AC
  Filled 2017-09-19: qty 2

## 2017-09-19 MED ORDER — ACETAMINOPHEN 325 MG PO TABS: 650.0000 mg | ORAL_TABLET | ORAL | Status: DC | PRN

## 2017-09-19 MED ORDER — LIDOCAINE HCL (PF) 1 % IJ SOLN
INTRAMUSCULAR | Status: AC
  Filled 2017-09-19: qty 30

## 2017-09-19 MED ORDER — ONDANSETRON HCL 4 MG/2ML IJ SOLN: 4.0000 mg | Freq: Four times a day (QID) | INTRAMUSCULAR | Status: DC | PRN

## 2017-09-19 MED ORDER — SODIUM CHLORIDE 0.9% FLUSH: 3.0000 mL | INTRAVENOUS | Status: DC | PRN

## 2017-09-19 MED ORDER — HEPARIN (PORCINE) IN NACL 2-0.9 UNITS/ML
INTRAMUSCULAR | Status: AC | PRN
  Administered 2017-09-19 (×2): 500 mL via INTRA_ARTERIAL

## 2017-09-19 MED ORDER — CLOPIDOGREL BISULFATE 300 MG PO TABS: 300.0000 mg | ORAL_TABLET | Freq: Once | ORAL | Status: DC

## 2017-09-19 MED ORDER — LABETALOL HCL 5 MG/ML IV SOLN: 10.0000 mg | INTRAVENOUS | Status: DC | PRN

## 2017-09-19 MED ORDER — CLOPIDOGREL BISULFATE 75 MG PO TABS: 75.0000 mg | ORAL_TABLET | Freq: Every day | ORAL | 5 refills | Status: DC

## 2017-09-19 MED ORDER — HEPARIN (PORCINE) IN NACL 1000-0.9 UT/500ML-% IV SOLN
INTRAVENOUS | Status: AC
  Filled 2017-09-19: qty 1000

## 2017-09-19 MED ORDER — LIDOCAINE HCL (PF) 1 % IJ SOLN
INTRAMUSCULAR | Status: DC | PRN
  Administered 2017-09-19: 30 mL

## 2017-09-19 MED ORDER — IODIXANOL 320 MG/ML IV SOLN
INTRAVENOUS | Status: DC | PRN
  Administered 2017-09-19: 120 mL via INTRA_ARTERIAL

## 2017-09-19 MED ORDER — MIDAZOLAM HCL 2 MG/2ML IJ SOLN
INTRAMUSCULAR | Status: DC | PRN
  Administered 2017-09-19: 1 mg via INTRAVENOUS

## 2017-09-19 SURGICAL SUPPLY — 16 items
CATH ANGIO 5F PIGTAIL 65CM (CATHETERS) ×2 IMPLANT
COVER PRB 48X5XTLSCP FOLD TPE (BAG) ×1 IMPLANT
COVER PROBE 5X48 (BAG) ×3
KIT ENCORE 26 ADVANTAGE (KITS) ×2 IMPLANT
KIT MICROPUNCTURE NIT STIFF (SHEATH) ×1 IMPLANT
KIT PV (KITS) ×3 IMPLANT
SHEATH AVANTI 11CM 5FR (SHEATH) ×1 IMPLANT
SHEATH BRITE TIP 7FR 35CM (SHEATH) ×4 IMPLANT
STENT VIABAHN 6X19X135 VBX (Permanent Stent) ×2 IMPLANT
STENT VIABAHN VBX 7X19X80 (Permanent Stent) ×2 IMPLANT
SYR MEDRAD MARK V 150ML (SYRINGE) ×3 IMPLANT
TRANSDUCER W/STOPCOCK (MISCELLANEOUS) ×3 IMPLANT
TRAY PV CATH (CUSTOM PROCEDURE TRAY) ×3 IMPLANT
WIRE BENTSON .035X145CM (WIRE) ×1 IMPLANT
WIRE ROSEN-J .035X260CM (WIRE) ×3 IMPLANT
WIRE TORQFLEX AUST .018X40CM (WIRE) ×3 IMPLANT

## 2017-09-19 NOTE — Interval H&P Note (Signed)
History and Physical Interval Note:  09/19/2017 10:35 AM  Priscilla [name]  has presented today for surgery, with the diagnosis of illac stenosis  The various methods of treatment have been discussed with the patient and family. After consideration of risks, benefits and other options for treatment, the patient has consented to  Procedure(s): ABDOMINAL AORTOGRAM W/LOWER EXTREMITY (N/A) as a surgical intervention .  The patient's history has been reviewed, patient examined, no change in status, stable for surgery.  I have reviewed the patient's chart and labs.  Questions were answered to the patient's satisfaction.     [name]

## 2017-09-19 NOTE — Progress Notes (Signed)
Site area: rt groin fa sheath pulled Site Prior to Removal:  Level 0 Pressure Applied For: 20 minutes Manual:   yes Patient Status During Pull:  stable Post Pull Site:  Level 0 Post Pull Instructions Given:  yes Post Pull Pulses Present: dopplered bilateral dp Dressing Applied:  Gauze and tegaderm Bedrest begins @ 1520 Comments:

## 2017-09-19 NOTE — Op Note (Signed)
PATIENTChrislyn [name]      MRN: [id number] DOB: 1971-01-26    DATE OF PROCEDURE: 09/19/2017  INDICATIONS:    Priscilla [name] is a 47 y.o. female who presents with critical limb ischemia of the left lower extremity.  She has rest pain on the left and a wound on her left heel.  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  PROCEDURE:    1.  Conscious sedation 2.  Ultrasound-guided access to bilateral common femoral arteries 3.  Aortogram with bilateral iliac arteriogram 4.  Bilateral common iliac artery angioplasty and stenting using a kissing balloon technique  Right-6 mm x 19 mm  VBX   left-   7 mm x 19 mm VBX 5.  Retrograde right femoral arteriogram 6.  Retrograde left femoral arteriogram  SURGEON: Judeth Cornfield. Scot Dock, MD, FACS  ANESTHESIA: Local with sedation  EBL: Minimal  TECHNIQUE: The patient was brought to the peripheral vascular lab and was sedated.  The period of conscious sedation was 90 minutes.  During that time period, I was present face-to-face 100% of the time.  The patient was administered 1 mg of Versed and 50 mcg of fentanyl. The patient's heart rate, blood pressure, and oxygen saturation were monitored by the nurse continuously during the procedure.  Both groins were prepped and draped in the usual sterile fashion.  Under ultrasound guidance, after the skin was anesthetized, I cannulated the right common femoral artery with a micropuncture needle and a micropuncture sheath was introduced over a wire.  This was exchanged for a 5 [place] sheath over a Bentson wire.  By ultrasound the femoral artery was patent. A real-time image was obtained and placed in the chart.   I then advanced a Bentson wire up into the infrarenal aorta.  A pigtail catheter was positioned at the L1 vertebral body and flush aortogram obtained.  This demonstrated bilateral common iliac artery stenoses.  On the right there was a 90% proximal right common iliac artery  stenosis.  On the left there was a 99% critical left common iliac artery stenosis.  I elected to address these with balloon angioplasty and stenting.  On the left side, under ultrasound guidance, after the skin was anesthetized, I cannulated the left common femoral artery with a micropuncture needle and a micropuncture sheath was introduced over a wire.  This was exchanged for a long 7 [place] sheath over a Bentson wire.  The patient was then heparinized.  ACT was monitored throughout the case.  On the right side a long Rosen wire was placed.  A 7 French sheath was placed on the right side.  On the left side I advanced a 7 French sheath through the stenosis.  The same was done on the right side.  Based on measurements that were obtained I selected a 6 mm x 19 mm VBX stent which was positioned in the proximal right common iliac artery.  The sheath was then retracted.  On the left side I positioned a 7 mm x 19 mm VBX stent which was positioned in the proximal left common iliac artery.  The sheath was retracted.  The stents were then deployed without difficulty.  There was some mild residual stenosis on the left and I went back and reinflated to higher pressures.  Completion film showed no residual stenosis and an excellent result.    Next a retrograde right femoral arteriogram was obtained to the right femoral sheath. Likewise a left femoral arteriogram was obtained to the left femoral  sheath.  The patient was transferred to the holding area for removal of the sheath.  No immediate complications were noted.  FINDINGS:   1.  There is no significant infrarenal aortic disease. 2.  There were bilateral tight common iliac artery stenoses which were addressed with balloon angioplasty and stenting as described above with no residual stenosis. 3.  On the right side the hypogastric and external iliac arteries are patent.  The common femoral deep femoral and superficial femoral arteries are patent.  There is mild  diffuse disease throughout the superficial femoral artery.  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  Below this the anterior tibial and peroneal artery are patent although the peroneal is occluded distally.  The posterior tibial artery on the right is occluded. 4.  On the left side the external iliac and hypogastric arteries are patent.  The common femoral, superficial femoral and deep femoral arteries are patent.  There is mild diffuse disease throughout the superficial femoral artery on the left.  There is single-vessel runoff in the left via the anterior tibial artery.  CLINICAL NOTE: I have started her on Plavix with which I think she should continue for 6 months.  [name], MD, FACS [facility]  DATE OF DICTATION:   09/19/2017

## 2017-09-19 NOTE — Progress Notes (Signed)
Will start next Wednesday with wond care

## 2017-09-19 NOTE — Progress Notes (Signed)
Site area: Left groin a 7 french long arterial sheath was removed  Site Prior to Removal:  Level 0  Pressure Applied For 20 MINUTES     BedrestBeginning at 1520p  Manual:   Yes.    Patient Status During Pull:  stable  Post Pull Groin Site:  Level 0  Post Pull Instructions Given:  Yes.    Post Pull Pulses Present:  Yes.    Dressing Applied:  Yes.    Comments:  VS remain stable.

## 2017-09-19 NOTE — Progress Notes (Signed)
Patient has an appointment with Kerin Ransom, PA on 09/25/17.

## 2017-09-19 NOTE — Discharge Instructions (Signed)

## 2017-09-20 NOTE — Progress Notes (Signed)
That's great news. Thanks [employer]

## 2017-09-23 ENCOUNTER — Encounter (HOSPITAL_COMMUNITY): Payer: Self-pay | Admitting: Vascular Surgery

## 2017-09-23 LAB — POCT ACTIVATED CLOTTING TIME
ACTIVATED CLOTTING TIME: 235 s
Activated Clotting Time: 241 seconds

## 2017-09-23 MED FILL — Heparin Sod (Porcine)-NaCl IV Soln 1000 Unit/500ML-0.9%: INTRAVENOUS | Qty: 500 | Status: AC

## 2017-09-23 MED FILL — Heparin Sod (Porcine)-NaCl IV Soln 1000 Unit/500ML-0.9%: INTRAVENOUS | Qty: 1000 | Status: AC

## 2017-09-24 ENCOUNTER — Encounter (HOSPITAL_BASED_OUTPATIENT_CLINIC_OR_DEPARTMENT_OTHER): Payer: Medicare Other | Attending: Physician Assistant

## 2017-09-24 DIAGNOSIS — L97528 Non-pressure chronic ulcer of other part of left foot with other specified severity: Secondary | ICD-10-CM | POA: Insufficient documentation

## 2017-09-24 DIAGNOSIS — E1051 Type 1 diabetes mellitus with diabetic peripheral angiopathy without gangrene: Secondary | ICD-10-CM | POA: Insufficient documentation

## 2017-09-24 DIAGNOSIS — I2510 Atherosclerotic heart disease of native coronary artery without angina pectoris: Secondary | ICD-10-CM | POA: Diagnosis not present

## 2017-09-24 DIAGNOSIS — E1022 Type 1 diabetes mellitus with diabetic chronic kidney disease: Secondary | ICD-10-CM | POA: Insufficient documentation

## 2017-09-24 DIAGNOSIS — N186 End stage renal disease: Secondary | ICD-10-CM | POA: Diagnosis not present

## 2017-09-24 DIAGNOSIS — I252 Old myocardial infarction: Secondary | ICD-10-CM | POA: Insufficient documentation

## 2017-09-24 DIAGNOSIS — E10621 Type 1 diabetes mellitus with foot ulcer: Secondary | ICD-10-CM | POA: Diagnosis not present

## 2017-09-24 DIAGNOSIS — L97822 Non-pressure chronic ulcer of other part of left lower leg with fat layer exposed: Secondary | ICD-10-CM | POA: Diagnosis not present

## 2017-09-24 DIAGNOSIS — Z86718 Personal history of other venous thrombosis and embolism: Secondary | ICD-10-CM | POA: Insufficient documentation

## 2017-09-24 DIAGNOSIS — Z794 Long term (current) use of insulin: Secondary | ICD-10-CM | POA: Diagnosis not present

## 2017-09-24 DIAGNOSIS — Z955 Presence of coronary angioplasty implant and graft: Secondary | ICD-10-CM | POA: Insufficient documentation

## 2017-09-24 DIAGNOSIS — Z992 Dependence on renal dialysis: Secondary | ICD-10-CM | POA: Insufficient documentation

## 2017-09-24 DIAGNOSIS — E10622 Type 1 diabetes mellitus with other skin ulcer: Secondary | ICD-10-CM | POA: Diagnosis not present

## 2017-09-24 DIAGNOSIS — Z940 Kidney transplant status: Secondary | ICD-10-CM | POA: Diagnosis not present

## 2017-09-25 ENCOUNTER — Ambulatory Visit: Payer: Managed Care, Other (non HMO) | Admitting: Cardiology

## 2017-09-25 ENCOUNTER — Telehealth: Payer: Self-pay | Admitting: Vascular Surgery

## 2017-09-25 NOTE — Telephone Encounter (Signed)
sch appt spk to pt 10/29/17 3pm ABI 345pm P/O MD s/p Aortogram Bil iliac arteriogram, Stent, Bil retrograde fem arteriogram per stf msg

## 2017-09-26 ENCOUNTER — Encounter: Payer: Self-pay | Admitting: Cardiology

## 2017-09-26 ENCOUNTER — Other Ambulatory Visit: Payer: Self-pay

## 2017-09-26 DIAGNOSIS — Z48812 Encounter for surgical aftercare following surgery on the circulatory system: Secondary | ICD-10-CM

## 2017-09-26 DIAGNOSIS — I739 Peripheral vascular disease, unspecified: Secondary | ICD-10-CM

## 2017-09-26 DIAGNOSIS — I70229 Atherosclerosis of native arteries of extremities with rest pain, unspecified extremity: Secondary | ICD-10-CM

## 2017-10-01 ENCOUNTER — Encounter (HOSPITAL_BASED_OUTPATIENT_CLINIC_OR_DEPARTMENT_OTHER): Payer: Medicare Other | Attending: Physician Assistant

## 2017-10-01 DIAGNOSIS — E10622 Type 1 diabetes mellitus with other skin ulcer: Secondary | ICD-10-CM | POA: Diagnosis not present

## 2017-10-01 DIAGNOSIS — N186 End stage renal disease: Secondary | ICD-10-CM | POA: Diagnosis not present

## 2017-10-01 DIAGNOSIS — I2510 Atherosclerotic heart disease of native coronary artery without angina pectoris: Secondary | ICD-10-CM | POA: Insufficient documentation

## 2017-10-01 DIAGNOSIS — E1022 Type 1 diabetes mellitus with diabetic chronic kidney disease: Secondary | ICD-10-CM | POA: Insufficient documentation

## 2017-10-01 DIAGNOSIS — L97422 Non-pressure chronic ulcer of left heel and midfoot with fat layer exposed: Secondary | ICD-10-CM | POA: Insufficient documentation

## 2017-10-01 DIAGNOSIS — Z794 Long term (current) use of insulin: Secondary | ICD-10-CM | POA: Diagnosis not present

## 2017-10-01 DIAGNOSIS — E1051 Type 1 diabetes mellitus with diabetic peripheral angiopathy without gangrene: Secondary | ICD-10-CM | POA: Diagnosis not present

## 2017-10-01 DIAGNOSIS — L97822 Non-pressure chronic ulcer of other part of left lower leg with fat layer exposed: Secondary | ICD-10-CM | POA: Diagnosis not present

## 2017-10-01 DIAGNOSIS — E10621 Type 1 diabetes mellitus with foot ulcer: Secondary | ICD-10-CM | POA: Insufficient documentation

## 2017-10-01 DIAGNOSIS — Z992 Dependence on renal dialysis: Secondary | ICD-10-CM | POA: Insufficient documentation

## 2017-10-01 DIAGNOSIS — Z86718 Personal history of other venous thrombosis and embolism: Secondary | ICD-10-CM | POA: Diagnosis not present

## 2017-10-01 DIAGNOSIS — I252 Old myocardial infarction: Secondary | ICD-10-CM | POA: Insufficient documentation

## 2017-10-14 ENCOUNTER — Encounter (HOSPITAL_COMMUNITY): Payer: Managed Care, Other (non HMO)

## 2017-10-14 ENCOUNTER — Encounter: Payer: Managed Care, Other (non HMO) | Admitting: Vascular Surgery

## 2017-10-15 DIAGNOSIS — E10621 Type 1 diabetes mellitus with foot ulcer: Secondary | ICD-10-CM | POA: Diagnosis not present

## 2017-10-29 ENCOUNTER — Encounter (HOSPITAL_BASED_OUTPATIENT_CLINIC_OR_DEPARTMENT_OTHER): Payer: Medicare Other | Attending: Physician Assistant

## 2017-10-29 ENCOUNTER — Encounter: Payer: 59 | Admitting: Vascular Surgery

## 2017-10-29 ENCOUNTER — Ambulatory Visit (HOSPITAL_COMMUNITY): Payer: 59

## 2017-10-29 DIAGNOSIS — Z86718 Personal history of other venous thrombosis and embolism: Secondary | ICD-10-CM | POA: Diagnosis not present

## 2017-10-29 DIAGNOSIS — N186 End stage renal disease: Secondary | ICD-10-CM | POA: Diagnosis not present

## 2017-10-29 DIAGNOSIS — Z940 Kidney transplant status: Secondary | ICD-10-CM | POA: Diagnosis not present

## 2017-10-29 DIAGNOSIS — Z794 Long term (current) use of insulin: Secondary | ICD-10-CM | POA: Diagnosis not present

## 2017-10-29 DIAGNOSIS — E1022 Type 1 diabetes mellitus with diabetic chronic kidney disease: Secondary | ICD-10-CM | POA: Insufficient documentation

## 2017-10-29 DIAGNOSIS — L97422 Non-pressure chronic ulcer of left heel and midfoot with fat layer exposed: Secondary | ICD-10-CM | POA: Insufficient documentation

## 2017-10-29 DIAGNOSIS — I252 Old myocardial infarction: Secondary | ICD-10-CM | POA: Insufficient documentation

## 2017-10-29 DIAGNOSIS — I2510 Atherosclerotic heart disease of native coronary artery without angina pectoris: Secondary | ICD-10-CM | POA: Insufficient documentation

## 2017-10-29 DIAGNOSIS — E10621 Type 1 diabetes mellitus with foot ulcer: Secondary | ICD-10-CM | POA: Insufficient documentation

## 2017-10-29 DIAGNOSIS — E1051 Type 1 diabetes mellitus with diabetic peripheral angiopathy without gangrene: Secondary | ICD-10-CM | POA: Diagnosis not present

## 2017-10-29 DIAGNOSIS — Z992 Dependence on renal dialysis: Secondary | ICD-10-CM | POA: Diagnosis not present

## 2017-11-12 ENCOUNTER — Ambulatory Visit (INDEPENDENT_AMBULATORY_CARE_PROVIDER_SITE_OTHER): Payer: Medicare Other | Admitting: Vascular Surgery

## 2017-11-12 ENCOUNTER — Ambulatory Visit (HOSPITAL_COMMUNITY)
Admission: RE | Admit: 2017-11-12 | Discharge: 2017-11-12 | Disposition: A | Discharge status: Home / Self Care | Payer: Medicare Other | Source: Ambulatory Visit | Attending: Vascular Surgery | Admitting: Vascular Surgery

## 2017-11-12 ENCOUNTER — Other Ambulatory Visit: Payer: Self-pay

## 2017-11-12 ENCOUNTER — Encounter: Payer: Self-pay | Admitting: Vascular Surgery

## 2017-11-12 VITALS — BP 145/75 | HR 73 | Temp 97.8°F | Resp 16 | Ht 60.0 in | Wt 119.0 lb

## 2017-11-12 DIAGNOSIS — I739 Peripheral vascular disease, unspecified: Secondary | ICD-10-CM

## 2017-11-12 DIAGNOSIS — Z48812 Encounter for surgical aftercare following surgery on the circulatory system: Secondary | ICD-10-CM | POA: Diagnosis present

## 2017-11-12 DIAGNOSIS — I2510 Atherosclerotic heart disease of native coronary artery without angina pectoris: Secondary | ICD-10-CM | POA: Insufficient documentation

## 2017-11-12 DIAGNOSIS — I70229 Atherosclerosis of native arteries of extremities with rest pain, unspecified extremity: Secondary | ICD-10-CM | POA: Diagnosis present

## 2017-11-12 DIAGNOSIS — I70203 Unspecified atherosclerosis of native arteries of extremities, bilateral legs: Secondary | ICD-10-CM | POA: Diagnosis not present

## 2017-11-12 DIAGNOSIS — E785 Hyperlipidemia, unspecified: Secondary | ICD-10-CM | POA: Diagnosis not present

## 2017-11-12 DIAGNOSIS — F17200 Nicotine dependence, unspecified, uncomplicated: Secondary | ICD-10-CM | POA: Diagnosis not present

## 2017-11-12 DIAGNOSIS — E1151 Type 2 diabetes mellitus with diabetic peripheral angiopathy without gangrene: Secondary | ICD-10-CM | POA: Diagnosis not present

## 2017-11-12 DIAGNOSIS — E10621 Type 1 diabetes mellitus with foot ulcer: Secondary | ICD-10-CM | POA: Diagnosis not present

## 2017-11-12 DIAGNOSIS — I770 Arteriovenous fistula, acquired: Secondary | ICD-10-CM | POA: Diagnosis not present

## 2017-11-12 DIAGNOSIS — I10 Essential (primary) hypertension: Secondary | ICD-10-CM | POA: Diagnosis not present

## 2017-11-12 NOTE — Progress Notes (Signed)
Patient name: Priscilla [name] MRN: [id number] DOB: [birth year] Sex: female  REASON FOR VISIT:   Follow-up after bilateral common iliac artery angioplasty and stenting  HPI:   Priscilla [name] is a pleasant 47 y.o. female who presented with critical limb ischemia of the left lower extremity.  She had rest pain on the left and a wound on her left heel.  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  On 09/19/2017, she underwent bilateral common iliac artery angioplasty and stenting using a kissing balloon technique with VBX Stents.  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  I suspect this is related to previous catheterization.   Since I saw her last she states that her rest pain in the left foot has resolved.  She still has some paresthesias likely related to her neuropathy and also previous ischemia.  She continues to have some mild calf claudication bilaterally but this is improved significantly.  She also describes pain along the lateral aspect of her left thigh but also has a history of some degenerative disc disease of her back and I suspect this is related to this.  Her wound on the left heel is followed at the wound care center.  She tells me this has improved significantly.  Where as it did not bleed before with debridement now it is bleeding some.  She is on aspirin.  She is on Plavix.  She is on a statin.  She is not a smoker.  Current Outpatient Medications  Medication Sig Dispense Refill  . acetaminophen (TYLENOL) 325 MG tablet Take 650 mg by mouth every 4 (four) hours as needed for mild pain.    Marland Kitchen aspirin 325 MG tablet Take 325 mg by mouth 2 (two) times daily.     . calcitRIOL (ROCALTROL) 0.5 MCG capsule Take 0.5 mcg by mouth daily.    . calcium acetate (PHOSLO) 667 MG capsule Take 2,668 mg by mouth 3 (three) times daily with meals.     . carvedilol (COREG) 6.25 MG  tablet TAKE 1/2 TABLET TWICE A DAY X 3 WEEKS THEN INCREASE TO 1 TABLET TWICE A DAY (Patient taking differently: Take 6.25 mg by mouth 2 (two) times daily with a meal. ) 60 tablet 6  . clopidogrel (PLAVIX) 75 MG tablet Take 1 tablet (75 mg total) by mouth daily. 30 tablet 5  . Insulin Degludec (TRESIBA FLEXTOUCH) 200 UNIT/ML SOPN Inject 30 Units into the skin at bedtime.     . insulin lispro (HUMALOG) 100 UNIT/ML injection Inject 1-10 Units into the skin 3 (three) times daily before meals. Per sliding scale    . levonorgestrel (MIRENA) 20 MCG/24HR IUD 1 each by Intrauterine route once.    . loperamide (IMODIUM) 2 MG capsule Take 2 mg by mouth every 8 (eight) hours as needed for diarrhea or loose stools.    Marland Kitchen losartan (COZAAR) 100 MG tablet Take 100 mg by mouth daily.    Marland Kitchen atorvastatin (LIPITOR) 20 MG tablet Take 1 tablet (20 mg total) by mouth daily. 90 tablet 3  . omeprazole (PRILOSEC) 20 MG capsule Take 1 capsule (20 mg total) by mouth daily as needed. (Patient taking differently: Take 20 mg by mouth daily as needed (for heartburn/reflux). ) 30 capsule   . oxyCODONE-acetaminophen (PERCOCET) 5-325 MG tablet Take 1 tablet by mouth every 6 (six) hours as needed. (Patient not taking: Reported on 11/12/2017) 10 tablet 0  No current facility-administered medications for this visit.     REVIEW OF SYSTEMS:  [X]  denotes positive finding, [ ]  denotes negative finding Vascular    Leg swelling    Cardiac    Chest pain or chest pressure:    Shortness of breath upon exertion:    Short of breath when lying flat:    Irregular heart rhythm:    Constitutional    Fever or chills:     PHYSICAL EXAM:   Vitals:   11/12/17 1000 11/12/17 1005  BP: (!) 149/75 (!) 145/75  Pulse: 73 73  Resp: 16   Temp: 97.8 F (36.6 C)   TempSrc: Oral   SpO2: 100%   Weight: 119 lb (54 kg)   Height: 5' (1.524 m)     GENERAL: The patient is a well-nourished female, in no acute distress. The vital signs are documented  above. CARDIOVASCULAR: There is a regular rate and rhythm. PULMONARY: There is good air exchange bilaterally without wheezing or rales. VASCULAR: I do not detect carotid bruits. On the right side she has a diminished femoral pulse.  She has a bruit in the right groin from her AV fistula.  I cannot palpate pedal pulses on the right. On the left side she has a palpable femoral pulse.  I cannot palpate pedal pulses. Both feet are warm and well-perfused. She has no significant lower extremity swelling. EXTREMITIES: Her wound on the left heel is being followed by the wound care center and I did not remove the dressing.  DATA:   LOWER EXTREMITY ARTERIAL DOPPLER STUDY: I have independently interpreted her lower extremity arterial Doppler study today.  On the right side there is a monophasic posterior tibial signal with a biphasic dorsalis pedis signal.  ABIs greater than 100% with a toe pressure of 93 mmHg.  On the left side there is a monophasic posterior tibial signal with a biphasic dorsalis pedis signal.  ABI is greater than 100% with a toe pressure of 103 mmHg.  MEDICAL ISSUES:   STATUS POST BILATERAL COMMON ILIAC ARTERY ANGIOPLASTY AND STENTING: The patient is doing well status post bilateral common iliac artery angioplasty and stenting.  Her wound on the left heel is improving.  Her rest pain and claudication have essentially resolved.  She is not a smoker.  I would like to continue her Plavix for at least 6 months.  I will see her back in 3 months with a duplex of her stents and ABIs.  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  I suspect this is related to previous catheterization will need to keep an eye on that.  If the fistula enlarges or becomes symptomatic we would have to consider addressing this.  [name] [facility]  [phone]

## 2017-11-12 NOTE — Progress Notes (Signed)
Vitals:   11/12/17 1000  BP: (!) 149/75  Pulse: 73  Resp: 16  Temp: 97.8 F (36.6 C)  TempSrc: Oral  SpO2: 100%  Weight: 119 lb (54 kg)  Height: 5' (1.524 m)

## 2017-11-13 ENCOUNTER — Other Ambulatory Visit: Payer: Self-pay

## 2017-11-13 DIAGNOSIS — I70229 Atherosclerosis of native arteries of extremities with rest pain, unspecified extremity: Secondary | ICD-10-CM

## 2017-11-13 DIAGNOSIS — I739 Peripheral vascular disease, unspecified: Secondary | ICD-10-CM

## 2017-11-26 DIAGNOSIS — E10621 Type 1 diabetes mellitus with foot ulcer: Secondary | ICD-10-CM | POA: Diagnosis not present

## 2017-12-10 ENCOUNTER — Encounter (HOSPITAL_BASED_OUTPATIENT_CLINIC_OR_DEPARTMENT_OTHER): Payer: Medicare Other | Attending: Internal Medicine

## 2017-12-10 DIAGNOSIS — Z09 Encounter for follow-up examination after completed treatment for conditions other than malignant neoplasm: Secondary | ICD-10-CM | POA: Diagnosis present

## 2017-12-10 DIAGNOSIS — Z86718 Personal history of other venous thrombosis and embolism: Secondary | ICD-10-CM | POA: Insufficient documentation

## 2017-12-10 DIAGNOSIS — E1022 Type 1 diabetes mellitus with diabetic chronic kidney disease: Secondary | ICD-10-CM | POA: Insufficient documentation

## 2017-12-10 DIAGNOSIS — E1051 Type 1 diabetes mellitus with diabetic peripheral angiopathy without gangrene: Secondary | ICD-10-CM | POA: Diagnosis not present

## 2017-12-10 DIAGNOSIS — I2510 Atherosclerotic heart disease of native coronary artery without angina pectoris: Secondary | ICD-10-CM | POA: Diagnosis not present

## 2017-12-10 DIAGNOSIS — I252 Old myocardial infarction: Secondary | ICD-10-CM | POA: Insufficient documentation

## 2017-12-10 DIAGNOSIS — N186 End stage renal disease: Secondary | ICD-10-CM | POA: Insufficient documentation

## 2017-12-10 DIAGNOSIS — Z940 Kidney transplant status: Secondary | ICD-10-CM | POA: Diagnosis not present

## 2017-12-10 DIAGNOSIS — Z794 Long term (current) use of insulin: Secondary | ICD-10-CM | POA: Diagnosis not present

## 2017-12-10 DIAGNOSIS — Z8631 Personal history of diabetic foot ulcer: Secondary | ICD-10-CM | POA: Diagnosis not present

## 2017-12-31 ENCOUNTER — Encounter (HOSPITAL_BASED_OUTPATIENT_CLINIC_OR_DEPARTMENT_OTHER): Payer: Medicare Other

## 2018-01-20 ENCOUNTER — Encounter (HOSPITAL_BASED_OUTPATIENT_CLINIC_OR_DEPARTMENT_OTHER): Payer: No Typology Code available for payment source | Attending: Internal Medicine

## 2018-01-22 ENCOUNTER — Telehealth (HOSPITAL_COMMUNITY): Payer: Self-pay | Admitting: Surgery

## 2018-01-22 NOTE — Telephone Encounter (Signed)
Received an order for UE arterial studies (bilateral)- from Dr. Linton Ham for nonhealing ulcer on rt palm, non-palpable radial pulse.   I left a voicemail at [phone] asking pt to call Rip Harbour at [facility] to make arrangements.

## 2018-01-27 ENCOUNTER — Other Ambulatory Visit (HOSPITAL_COMMUNITY): Payer: Self-pay

## 2018-01-27 DIAGNOSIS — L98491 Non-pressure chronic ulcer of skin of other sites limited to breakdown of skin: Secondary | ICD-10-CM

## 2018-01-28 ENCOUNTER — Encounter (HOSPITAL_BASED_OUTPATIENT_CLINIC_OR_DEPARTMENT_OTHER): Payer: Medicare Other | Attending: Physician Assistant

## 2018-01-28 ENCOUNTER — Ambulatory Visit (HOSPITAL_COMMUNITY)
Admission: RE | Admit: 2018-01-28 | Discharge: 2018-01-28 | Disposition: A | Discharge status: Home / Self Care | Payer: Medicare Other | Source: Ambulatory Visit | Attending: Family | Admitting: Family

## 2018-01-28 DIAGNOSIS — E1051 Type 1 diabetes mellitus with diabetic peripheral angiopathy without gangrene: Secondary | ICD-10-CM | POA: Insufficient documentation

## 2018-01-28 DIAGNOSIS — I70208 Unspecified atherosclerosis of native arteries of extremities, other extremity: Secondary | ICD-10-CM | POA: Diagnosis not present

## 2018-01-28 DIAGNOSIS — L98491 Non-pressure chronic ulcer of skin of other sites limited to breakdown of skin: Secondary | ICD-10-CM

## 2018-01-28 DIAGNOSIS — E10622 Type 1 diabetes mellitus with other skin ulcer: Secondary | ICD-10-CM | POA: Insufficient documentation

## 2018-01-28 DIAGNOSIS — Z940 Kidney transplant status: Secondary | ICD-10-CM | POA: Diagnosis not present

## 2018-01-28 DIAGNOSIS — L98499 Non-pressure chronic ulcer of skin of other sites with unspecified severity: Secondary | ICD-10-CM | POA: Insufficient documentation

## 2018-01-28 DIAGNOSIS — Z992 Dependence on renal dialysis: Secondary | ICD-10-CM | POA: Diagnosis not present

## 2018-02-11 ENCOUNTER — Encounter: Payer: Self-pay | Admitting: Vascular Surgery

## 2018-02-11 ENCOUNTER — Ambulatory Visit (INDEPENDENT_AMBULATORY_CARE_PROVIDER_SITE_OTHER): Payer: Medicare Other | Admitting: Vascular Surgery

## 2018-02-11 ENCOUNTER — Ambulatory Visit (HOSPITAL_COMMUNITY)
Admission: RE | Admit: 2018-02-11 | Discharge: 2018-02-11 | Disposition: A | Discharge status: Home / Self Care | Payer: Medicare Other | Source: Ambulatory Visit | Attending: Vascular Surgery | Admitting: Vascular Surgery

## 2018-02-11 ENCOUNTER — Ambulatory Visit (INDEPENDENT_AMBULATORY_CARE_PROVIDER_SITE_OTHER)
Admission: RE | Admit: 2018-02-11 | Discharge: 2018-02-11 | Disposition: A | Discharge status: Home / Self Care | Payer: Medicare Other | Source: Ambulatory Visit | Attending: Vascular Surgery | Admitting: Vascular Surgery

## 2018-02-11 VITALS — BP 119/77 | HR 86 | Temp 97.0°F | Resp 16 | Ht 60.0 in | Wt 114.0 lb

## 2018-02-11 DIAGNOSIS — I739 Peripheral vascular disease, unspecified: Secondary | ICD-10-CM

## 2018-02-11 DIAGNOSIS — I70229 Atherosclerosis of native arteries of extremities with rest pain, unspecified extremity: Secondary | ICD-10-CM | POA: Diagnosis present

## 2018-02-11 NOTE — Progress Notes (Signed)
Patient name: Priscilla [name] MRN: [id number] DOB: 08-Jan-1971 Sex: female  REASON FOR VISIT:   Follow-up after bilateral common iliac artery angioplasty and stenting.  HPI:   Priscilla [name] is a pleasant 47 y.o. female who I last saw on 11/12/2017.  She had presented with critical limb ischemia of her left lower extremity.  She had rest pain of the left foot and a wound on the left heel.  CT showed multilevel arterial occlusive disease.  On 09/19/2017 she underwent angioplasty and stenting of bilateral common iliac arteries with VBX stents.  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  I suspect this was related to a previous catheterization.  When I saw her last the rest pain in the left foot had resolved.  The wound on her left heel was being followed at the wound care center.  I set her up for a 91-month follow-up visit.  Of note my plan was to keep her on Plavix for at least 6 months.  Since I saw her last, she denies any claudication or rest pain in either lower extremity.  She has developed a very small wound on the tip of her right great toe.  In addition, she fell and sustained an injury to her right wrist which was slow to heal.  She is followed at the wound care center.  This prompted an upper extremity arterial duplex scan which showed no evidence of significant arterial insufficiency on the right.  She remains on aspirin, Plavix, and statin.  Past Medical History:  Diagnosis Date  . Anemia   . CAD (coronary artery disease)   . Cardiomyopathy, ischemic 06/24/2014  . Diabetes mellitus without complication ([place])   . End stage renal disease ([place])   . GERD (gastroesophageal reflux disease)   . H/O kidney transplant   . Hyperlipidemia   . Hypertension   . Myocardial infarction ([place])   . Pancreas transplanted ([place])   . Renal disorder   . 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 S/P mitral valve repair  07/18/14    Family History  Problem Relation Age of Onset  . CAD Mother   . Stroke Mother   . COPD Father   . Colon cancer Neg Hx     SOCIAL HISTORY: Social History   Tobacco Use  . Smoking status: Never Smoker  . Smokeless tobacco: Never Used  Substance Use Topics  . Alcohol use: No    Alcohol/week: 0.0 standard drinks    Allergies  Allergen Reactions  . Pork-Derived Products Other (See Comments)    Pt is [place]    Current Outpatient Medications  Medication Sig Dispense Refill  . acetaminophen (TYLENOL) 325 MG tablet Take 650 mg by mouth every 4 (four) hours as needed for mild pain.    Marland Kitchen amitriptyline (ELAVIL) 10 MG tablet TK 1 T PO QHS PRN  3  . aspirin 325 MG tablet Take 325 mg by mouth 2 (two) times daily.     Marland Kitchen atorvastatin (LIPITOR) 20 MG tablet Take 1 tablet (20 mg total) by mouth daily. 90 tablet 3  . calcitRIOL (ROCALTROL) 0.5 MCG capsule Take 0.5 mcg by mouth daily.    . calcium acetate (PHOSLO) 667 MG capsule Take 2,668 mg by mouth 3 (three) times daily with meals.     . carvedilol (COREG) 6.25 MG tablet TAKE 1/2 TABLET TWICE A DAY X 3 WEEKS THEN INCREASE TO 1  TABLET TWICE A DAY (Patient taking differently: Take 6.25 mg by mouth 2 (two) times daily with a meal. ) 60 tablet 6  . clopidogrel (PLAVIX) 75 MG tablet Take 1 tablet (75 mg total) by mouth daily. 30 tablet 5  . gabapentin (NEURONTIN) 100 MG capsule Take 100 mg by mouth 3 (three) times daily.    . Insulin Degludec (TRESIBA FLEXTOUCH) 200 UNIT/ML SOPN Inject 30 Units into the skin at bedtime.     . insulin lispro (HUMALOG) 100 UNIT/ML injection Inject 1-10 Units into the skin 3 (three) times daily before meals. Per sliding scale    . levonorgestrel (MIRENA) 20 MCG/24HR IUD 1 each by Intrauterine route once.    . loperamide (IMODIUM) 2 MG capsule Take 2 mg by mouth every 8 (eight) hours as needed for diarrhea or loose stools.    Marland Kitchen losartan (COZAAR) 100 MG tablet Take 100 mg by mouth daily.    Marland Kitchen omeprazole  (PRILOSEC) 20 MG capsule Take 1 capsule (20 mg total) by mouth daily as needed. (Patient taking differently: Take 20 mg by mouth daily as needed (for heartburn/reflux). ) 30 capsule    No current facility-administered medications for this visit.     REVIEW OF SYSTEMS:  [X]  denotes positive finding, [ ]  denotes negative finding Cardiac  Comments:  Chest pain or chest pressure:    Shortness of breath upon exertion:    Short of breath when lying flat:    Irregular heart rhythm:        Vascular    Pain in calf, thigh, or hip brought on by ambulation:    Pain in feet at night that wakes you up from your sleep:     Blood clot in your veins:    Leg swelling:         Pulmonary    Oxygen at home:    Productive cough:     Wheezing:         Neurologic    Sudden weakness in arms or legs:     Sudden numbness in arms or legs:     Sudden onset of difficulty speaking or slurred speech:    Temporary loss of vision in one eye:     Problems with dizziness:         Gastrointestinal    Blood in stool:     Vomited blood:         Genitourinary    Burning when urinating:     Blood in urine:        Psychiatric    Major depression:         Hematologic    Bleeding problems:    Problems with blood clotting too easily:        Skin    Rashes or ulcers:        Constitutional    Fever or chills:     PHYSICAL EXAM:   Vitals:   02/11/18 1017  BP: 119/77  Pulse: 86  Resp: 16  Temp: (!) 97 F (36.1 C)  TempSrc: Oral  Weight: 114 lb (51.7 kg)  Height: 5' (1.524 m)    GENERAL: The patient is a well-nourished female, in no acute distress. The vital signs are documented above. CARDIAC: There is a regular rate and rhythm.  VASCULAR: I do not detect carotid bruits. She has palpable femoral pulses.  I cannot palpate pedal pulses however both feet are warm and well-perfused. PULMONARY: There is good air exchange bilaterally without wheezing or  rales. ABDOMEN: Soft and non-tender with  normal pitched bowel sounds.  She has a peritoneal dialysis catheter. MUSCULOSKELETAL: There are no major deformities or cyanosis. NEUROLOGIC: No focal weakness or paresthesias are detected. SKIN: There are no ulcers or rashes noted. PSYCHIATRIC: The patient has a normal affect.  DATA:    ARTERIAL DOPPLER STUDY: I have independently interpreted her arterial Doppler study today.  On the right side there is a biphasic dorsalis pedis signal.  Posterior tibial signal cannot be obtained.  The arteries are not compressible so the ABIs not accurate.  Toe pressure on the right is 67 mmHg.  On the left side there is a biphasic dorsalis pedis signal.  Posterior tibial signal cannot be obtained.  ABIs not accurate because the arteries are calcified.  Toe pressure on the left is 94 mmHg.  ARTERIAL DUPLEX: I have independently interpreted her arterial duplex scan of her stents today.  On the right side there are elevated velocities in the distal stent with a peak systolic velocity of 976 cm/s.  There is biphasic signals throughout the stent.  On the left side there are no areas of significantly increased velocity noted.  There are biphasic signal throughout the stent.  I reviewed her previous upper extremity arterial Doppler study that was done on 01/28/2018.  On the right side, which is the side of concern there was a triphasic axillary, brachial, radial, and ulnar signal.  The arteries were somewhat calcified.  On the left side there was a triphasic axillary, brachial and ulnar signal with a monophasic radial signal.  MEDICAL ISSUES:   MULTILEVEL ARTERIAL OCCLUSIVE DISEASE: This patient has multilevel arterial occlusive disease.  She has undergone successful angioplasty and stenting of bilateral common iliac artery stenoses.  She has developed a mild stenosis at the distal aspect of her right common iliac artery stent but has biphasic signals throughout and also a biphasic signal in the dorsalis pedis  artery on the right.  I recommended that we repeat her study in 3 months to follow this closely.  I think she should stay on her Plavix for now.  She is currently on Plavix, aspirin, and a statin.  We will also need to keep a close eye on the small wound on the tip of her right great toe.  Fortunately she is not a smoker.  I encouraged her to stay as active as possible.  I will see her back in 3 months.  She knows to call sooner if she has problems.  [name] [facility] [phone]

## 2018-05-15 ENCOUNTER — Other Ambulatory Visit: Payer: Self-pay | Admitting: Vascular Surgery

## 2018-06-01 ENCOUNTER — Encounter (HOSPITAL_COMMUNITY): Payer: Self-pay

## 2018-06-03 ENCOUNTER — Ambulatory Visit: Payer: Self-pay | Admitting: Vascular Surgery

## 2018-06-04 ENCOUNTER — Encounter (HOSPITAL_COMMUNITY): Payer: Self-pay

## 2018-06-18 ENCOUNTER — Ambulatory Visit: Payer: Self-pay | Admitting: Vascular Surgery

## 2018-06-19 ENCOUNTER — Other Ambulatory Visit: Payer: Self-pay

## 2018-06-19 DIAGNOSIS — I739 Peripheral vascular disease, unspecified: Secondary | ICD-10-CM

## 2018-06-19 DIAGNOSIS — I70229 Atherosclerosis of native arteries of extremities with rest pain, unspecified extremity: Secondary | ICD-10-CM

## 2018-06-24 ENCOUNTER — Other Ambulatory Visit: Payer: Self-pay

## 2018-06-24 ENCOUNTER — Ambulatory Visit (HOSPITAL_COMMUNITY)
Admission: RE | Admit: 2018-06-24 | Discharge: 2018-06-24 | Disposition: A | Discharge status: Home / Self Care | Payer: Medicare Other | Source: Ambulatory Visit | Attending: Family | Admitting: Family

## 2018-06-24 ENCOUNTER — Encounter: Payer: Self-pay | Admitting: *Deleted

## 2018-06-24 ENCOUNTER — Other Ambulatory Visit: Payer: Self-pay | Admitting: *Deleted

## 2018-06-24 ENCOUNTER — Ambulatory Visit (INDEPENDENT_AMBULATORY_CARE_PROVIDER_SITE_OTHER): Payer: Medicare Other | Admitting: Vascular Surgery

## 2018-06-24 ENCOUNTER — Ambulatory Visit (INDEPENDENT_AMBULATORY_CARE_PROVIDER_SITE_OTHER)
Admission: RE | Admit: 2018-06-24 | Discharge: 2018-06-24 | Disposition: A | Discharge status: Home / Self Care | Payer: Medicare Other | Source: Ambulatory Visit | Attending: Family | Admitting: Family

## 2018-06-24 ENCOUNTER — Encounter: Payer: Self-pay | Admitting: Vascular Surgery

## 2018-06-24 VITALS — BP 155/82 | HR 86 | Resp 18 | Ht 60.0 in | Wt 120.0 lb

## 2018-06-24 DIAGNOSIS — I739 Peripheral vascular disease, unspecified: Secondary | ICD-10-CM | POA: Insufficient documentation

## 2018-06-24 DIAGNOSIS — I70229 Atherosclerosis of native arteries of extremities with rest pain, unspecified extremity: Secondary | ICD-10-CM

## 2018-06-24 NOTE — Progress Notes (Signed)
Patient name: Priscilla [name] MRN: [id number] DOB: Mar 30, 1971 Sex: female  REASON FOR VISIT:   Follow-up of peripheral vascular disease.  HPI:   Priscilla [name] is a pleasant 48 y.o. female with a history of aortoiliac occlusive disease.  She had presented with rest pain of her left foot.  She had multilevel arterial occlusive disease.  On 09/19/2017 she had angioplasty and stenting of bilateral common iliac arteries with VBX stents.  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  This is likely related to previous catheterization.  When I saw her last she was not having significant claudication or rest pain.  She comes in for a 43-month follow-up visit.  Since I saw her last she continues to have some claudication in both calves.  Her symptoms are brought on by ambulation and relieved with rest.  She states that she can walk a "good distance" before experiencing symptoms.  She cannot really quantitate the distance.  She denies any history of rest pain.  She has developed a small wound on her right great toe which has not shown any significant improvement.  She denies any history of rest pain.  She underwent coronary revascularization in 2016.  Vein was taken from the right leg.  Her heart cath was in late 2015 she believes.  This was via a right femoral approach.  She is on peritoneal dialysis.  She is on aspirin, Plavix, and a statin.  Past Medical History:  Diagnosis Date  . Anemia   . CAD (coronary artery disease)   . Cardiomyopathy, ischemic 06/24/2014  . Diabetes mellitus without complication ([place])   . End stage renal disease ([place])   . GERD (gastroesophageal reflux disease)   . H/O kidney transplant   . Hyperlipidemia   . Hypertension   . Myocardial infarction ([place])   . Pancreas transplanted ([place])   . Renal disorder   . 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 S/P mitral valve repair 07/18/14    Family  History  Problem Relation Age of Onset  . CAD Mother   . Stroke Mother   . COPD Father   . Colon cancer Neg Hx     SOCIAL HISTORY: Social History   Tobacco Use  . Smoking status: Never Smoker  . Smokeless tobacco: Never Used  Substance Use Topics  . Alcohol use: No    Alcohol/week: 0.0 standard drinks    Allergies  Allergen Reactions  . Pork-Derived Products Other (See Comments)    Pt is Muslum    Current Outpatient Medications  Medication Sig Dispense Refill  . acetaminophen (TYLENOL) 325 MG tablet Take 650 mg by mouth every 4 (four) hours as needed for mild pain.    Marland Kitchen amitriptyline (ELAVIL) 10 MG tablet TK 1 T PO QHS PRN  3  . aspirin 325 MG tablet Take 325 mg by mouth 2 (two) times daily.     . calcitRIOL (ROCALTROL) 0.5 MCG capsule Take 0.5 mcg by mouth daily.    . calcium acetate (PHOSLO) 667 MG capsule Take 2,668 mg by mouth 3 (three) times daily with meals.     . carvedilol (COREG) 6.25 MG tablet TAKE 1/2 TABLET TWICE A DAY X 3 WEEKS THEN INCREASE TO 1 TABLET TWICE A DAY (Patient taking differently: Take 6.25 mg by mouth 2 (two) times daily with a meal. ) 60 tablet 6  . clopidogrel (PLAVIX) 75 MG tablet TAKE 1 TABLET BY  MOUTH DAILY 30 tablet 5  . gabapentin (NEURONTIN) 100 MG capsule Take 100 mg by mouth 3 (three) times daily.    . Insulin Degludec (TRESIBA FLEXTOUCH) 200 UNIT/ML SOPN Inject 30 Units into the skin at bedtime.     . insulin lispro (HUMALOG) 100 UNIT/ML injection Inject 1-10 Units into the skin 3 (three) times daily before meals. Per sliding scale    . levonorgestrel (MIRENA) 20 MCG/24HR IUD 1 each by Intrauterine route once.    . loperamide (IMODIUM) 2 MG capsule Take 2 mg by mouth every 8 (eight) hours as needed for diarrhea or loose stools.    Marland Kitchen losartan (COZAAR) 100 MG tablet Take 100 mg by mouth daily.    . Pancrelipase, Lip-Prot-Amyl, (CREON PO) Take by mouth. 3 tablets with every meal , 2 tablets with snacks daily    . atorvastatin (LIPITOR) 20 MG  tablet Take 1 tablet (20 mg total) by mouth daily. 90 tablet 3  . omeprazole (PRILOSEC) 20 MG capsule Take 1 capsule (20 mg total) by mouth daily as needed. (Patient taking differently: Take 20 mg by mouth daily as needed (for heartburn/reflux). ) 30 capsule    No current facility-administered medications for this visit.     REVIEW OF SYSTEMS:  [X]  denotes positive finding, [ ]  denotes negative finding Cardiac  Comments:  Chest pain or chest pressure:    Shortness of breath upon exertion:    Short of breath when lying flat:    Irregular heart rhythm:        Vascular    Pain in calf, thigh, or hip brought on by ambulation: x   Pain in feet at night that wakes you up from your sleep:     Blood clot in your veins:    Leg swelling:         Pulmonary    Oxygen at home:    Productive cough:     Wheezing:         Neurologic    Sudden weakness in arms or legs:     Sudden numbness in arms or legs:     Sudden onset of difficulty speaking or slurred speech:    Temporary loss of vision in one eye:     Problems with dizziness:         Gastrointestinal    Blood in stool:     Vomited blood:         Genitourinary    Burning when urinating:     Blood in urine:        Psychiatric    Major depression:         Hematologic    Bleeding problems:    Problems with blood clotting too easily:        Skin    Rashes or ulcers: x  right great toe      Constitutional    Fever or chills:     PHYSICAL EXAM:   Vitals:   06/24/18 1122  BP: (!) 155/82  Pulse: 86  Resp: 18  SpO2: 100%  Weight: 120 lb 0.7 oz (54.5 kg)  Height: 5' (1.524 m)    GENERAL: The patient is a well-nourished female, in no acute distress. The vital signs are documented above. CARDIAC: There is a regular rate and rhythm.  VASCULAR: I do not detect carotid bruits. She has a loud right femoral bruit from her AV fistula. She has palpable femoral pulses.  I cannot palpate pedal pulses. PULMONARY: There is  good air  exchange bilaterally without wheezing or rales. ABDOMEN: Soft and non-tender with normal pitched bowel sounds.  MUSCULOSKELETAL: There are no major deformities or cyanosis. NEUROLOGIC: No focal weakness or paresthesias are detected. SKIN: There are no ulcers or rashes noted. PSYCHIATRIC: The patient has a normal affect.  DATA:    AORTOILIAC DUPLEX: I have independently interpreted her aortoiliac duplex.  On the right side there is a greater than 50% stenosis in the midportion of the stent.  There is no significant stenosis in the left iliac stent noted.  There are elevated velocities in the external iliac arteries.  ARTERIAL DOPPLER STUDY: I have independently interpreted her arterial Doppler study today.  On the right side there is a monophasic dorsalis pedis and posterior tibial signal.  ABI is greater than 100% given her calcific disease.  Toe pressure on the right is 159 mmHg.  On the left side there is a monophasic posterior tibial signal with a triphasic dorsalis pedis signal.  ABIs greater than 100%.  Toe pressure on the left is 122 mmHg.   MEDICAL ISSUES:   AORTOILIAC OCCLUSIVE DISEASE: This patient is undergone bilateral common iliac artery stents with a VBX stents.  These are patent with some moderate recurrent disease in the right iliac stent of greater than 50%.  However the velocities are only moderately elevated.  She is on aspirin, Plavix, and a statin.  I have ordered a follow-up duplex and ABIs in 6 months and I will see her back at that time.  She is not a smoker.  AV FISTULA RIGHT GROIN: This patient had cardiac catheterization in 2015 prior to her coronary revascularization.  On her arteriogram in May of last year she was noted to have a fistula in the right groin.  It was not causing symptoms but now she has a wound on her foot that is not healing and for this reason I think we need to address this AV fistula.  She has a loud bruit in the right groin and I think this  could potentially significantly be compromising her perfusion distally.  This is been present since 2015 so technically this could be challenging however I think that that needs to be addressed.  She has fairly small arteries and I suspect the injury is near the bifurcation of the common femoral artery.  Her arteries are fairly small.  I explained that there is a chance we will have to take vein from the left leg to repair the arteries.  I have discussed with her the indications for the procedure and the potential complications including but not limited to bleeding, arterial injury, wound healing problems.  All of her questions were answered she is agreeable to proceed.  Her surgery scheduled for 07/07/2018.  END-STAGE RENAL DISEASE: She does peritoneal dialysis daily.  [name] [facility] [phone]

## 2018-06-24 NOTE — H&P (View-Only) (Signed)
Patient name: Priscilla [name] MRN: [id number] DOB: 1970-08-22 Sex: female  REASON FOR VISIT:   Follow-up of peripheral vascular disease.  HPI:   Priscilla [name] is a pleasant 48 y.o. female with a history of aortoiliac occlusive disease.  She had presented with rest pain of her left foot.  She had multilevel arterial occlusive disease.  On 09/19/2017 she had angioplasty and stenting of bilateral common iliac arteries with VBX stents.  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  This is likely related to previous catheterization.  When I saw her last she was not having significant claudication or rest pain.  She comes in for a 17-month follow-up visit.  Since I saw her last she continues to have some claudication in both calves.  Her symptoms are brought on by ambulation and relieved with rest.  She states that she can walk a "good distance" before experiencing symptoms.  She cannot really quantitate the distance.  She denies any history of rest pain.  She has developed a small wound on her right great toe which has not shown any significant improvement.  She denies any history of rest pain.  She underwent coronary revascularization in 2016.  Vein was taken from the right leg.  Her heart cath was in late 2015 she believes.  This was via a right femoral approach.  She is on peritoneal dialysis.  She is on aspirin, Plavix, and a statin.  Past Medical History:  Diagnosis Date  . Anemia   . CAD (coronary artery disease)   . Cardiomyopathy, ischemic 06/24/2014  . Diabetes mellitus without complication ([place])   . End stage renal disease ([place])   . GERD (gastroesophageal reflux disease)   . H/O kidney transplant   . Hyperlipidemia   . Hypertension   . Myocardial infarction ([place])   . Pancreas transplanted ([place])   . Renal disorder   . 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 S/P mitral valve repair 07/18/14    Family  History  Problem Relation Age of Onset  . CAD Mother   . Stroke Mother   . COPD Father   . Colon cancer Neg Hx     SOCIAL HISTORY: Social History   Tobacco Use  . Smoking status: Never Smoker  . Smokeless tobacco: Never Used  Substance Use Topics  . Alcohol use: No    Alcohol/week: 0.0 standard drinks    Allergies  Allergen Reactions  . Pork-Derived Products Other (See Comments)    Pt is Muslum    Current Outpatient Medications  Medication Sig Dispense Refill  . acetaminophen (TYLENOL) 325 MG tablet Take 650 mg by mouth every 4 (four) hours as needed for mild pain.    Marland Kitchen amitriptyline (ELAVIL) 10 MG tablet TK 1 T PO QHS PRN  3  . aspirin 325 MG tablet Take 325 mg by mouth 2 (two) times daily.     . calcitRIOL (ROCALTROL) 0.5 MCG capsule Take 0.5 mcg by mouth daily.    . calcium acetate (PHOSLO) 667 MG capsule Take 2,668 mg by mouth 3 (three) times daily with meals.     . carvedilol (COREG) 6.25 MG tablet TAKE 1/2 TABLET TWICE A DAY X 3 WEEKS THEN INCREASE TO 1 TABLET TWICE A DAY (Patient taking differently: Take 6.25 mg by mouth 2 (two) times daily with a meal. ) 60 tablet 6  . clopidogrel (PLAVIX) 75 MG tablet TAKE 1 TABLET BY  MOUTH DAILY 30 tablet 5  . gabapentin (NEURONTIN) 100 MG capsule Take 100 mg by mouth 3 (three) times daily.    . Insulin Degludec (TRESIBA FLEXTOUCH) 200 UNIT/ML SOPN Inject 30 Units into the skin at bedtime.     . insulin lispro (HUMALOG) 100 UNIT/ML injection Inject 1-10 Units into the skin 3 (three) times daily before meals. Per sliding scale    . levonorgestrel (MIRENA) 20 MCG/24HR IUD 1 each by Intrauterine route once.    . loperamide (IMODIUM) 2 MG capsule Take 2 mg by mouth every 8 (eight) hours as needed for diarrhea or loose stools.    Marland Kitchen losartan (COZAAR) 100 MG tablet Take 100 mg by mouth daily.    . Pancrelipase, Lip-Prot-Amyl, (CREON PO) Take by mouth. 3 tablets with every meal , 2 tablets with snacks daily    . atorvastatin (LIPITOR) 20 MG  tablet Take 1 tablet (20 mg total) by mouth daily. 90 tablet 3  . omeprazole (PRILOSEC) 20 MG capsule Take 1 capsule (20 mg total) by mouth daily as needed. (Patient taking differently: Take 20 mg by mouth daily as needed (for heartburn/reflux). ) 30 capsule    No current facility-administered medications for this visit.     REVIEW OF SYSTEMS:  [X]  denotes positive finding, [ ]  denotes negative finding Cardiac  Comments:  Chest pain or chest pressure:    Shortness of breath upon exertion:    Short of breath when lying flat:    Irregular heart rhythm:        Vascular    Pain in calf, thigh, or hip brought on by ambulation: x   Pain in feet at night that wakes you up from your sleep:     Blood clot in your veins:    Leg swelling:         Pulmonary    Oxygen at home:    Productive cough:     Wheezing:         Neurologic    Sudden weakness in arms or legs:     Sudden numbness in arms or legs:     Sudden onset of difficulty speaking or slurred speech:    Temporary loss of vision in one eye:     Problems with dizziness:         Gastrointestinal    Blood in stool:     Vomited blood:         Genitourinary    Burning when urinating:     Blood in urine:        Psychiatric    Major depression:         Hematologic    Bleeding problems:    Problems with blood clotting too easily:        Skin    Rashes or ulcers: x  right great toe      Constitutional    Fever or chills:     PHYSICAL EXAM:   Vitals:   06/24/18 1122  BP: (!) 155/82  Pulse: 86  Resp: 18  SpO2: 100%  Weight: 120 lb 0.7 oz (54.5 kg)  Height: 5' (1.524 m)    GENERAL: The patient is a well-nourished female, in no acute distress. The vital signs are documented above. CARDIAC: There is a regular rate and rhythm.  VASCULAR: I do not detect carotid bruits. She has a loud right femoral bruit from her AV fistula. She has palpable femoral pulses.  I cannot palpate pedal pulses. PULMONARY: There is  good air  exchange bilaterally without wheezing or rales. ABDOMEN: Soft and non-tender with normal pitched bowel sounds.  MUSCULOSKELETAL: There are no major deformities or cyanosis. NEUROLOGIC: No focal weakness or paresthesias are detected. SKIN: There are no ulcers or rashes noted. PSYCHIATRIC: The patient has a normal affect.  DATA:    AORTOILIAC DUPLEX: I have independently interpreted her aortoiliac duplex.  On the right side there is a greater than 50% stenosis in the midportion of the stent.  There is no significant stenosis in the left iliac stent noted.  There are elevated velocities in the external iliac arteries.  ARTERIAL DOPPLER STUDY: I have independently interpreted her arterial Doppler study today.  On the right side there is a monophasic dorsalis pedis and posterior tibial signal.  ABI is greater than 100% given her calcific disease.  Toe pressure on the right is 159 mmHg.  On the left side there is a monophasic posterior tibial signal with a triphasic dorsalis pedis signal.  ABIs greater than 100%.  Toe pressure on the left is 122 mmHg.   MEDICAL ISSUES:   AORTOILIAC OCCLUSIVE DISEASE: This patient is undergone bilateral common iliac artery stents with a VBX stents.  These are patent with some moderate recurrent disease in the right iliac stent of greater than 50%.  However the velocities are only moderately elevated.  She is on aspirin, Plavix, and a statin.  I have ordered a follow-up duplex and ABIs in 6 months and I will see her back at that time.  She is not a smoker.  AV FISTULA RIGHT GROIN: This patient had cardiac catheterization in 2015 prior to her coronary revascularization.  On her arteriogram in May of last year she was noted to have a fistula in the right groin.  It was not causing symptoms but now she has a wound on her foot that is not healing and for this reason I think we need to address this AV fistula.  She has a loud bruit in the right groin and I think this  could potentially significantly be compromising her perfusion distally.  This is been present since 2015 so technically this could be challenging however I think that that needs to be addressed.  She has fairly small arteries and I suspect the injury is near the bifurcation of the common femoral artery.  Her arteries are fairly small.  I explained that there is a chance we will have to take vein from the left leg to repair the arteries.  I have discussed with her the indications for the procedure and the potential complications including but not limited to bleeding, arterial injury, wound healing problems.  All of her questions were answered she is agreeable to proceed.  Her surgery scheduled for 07/07/2018.  END-STAGE RENAL DISEASE: She does peritoneal dialysis daily.  [name] [facility] [phone]

## 2018-06-29 NOTE — Pre-Procedure Instructions (Signed)
Priscilla [name]  06/29/2018      [id number] - [place], [address] [place] [address] Phone: [phone] Fax: [phone]    Your procedure is scheduled on Tues., July 07, 2018 from 7:30AM-10:23AM  Report to [facility] Entrance "A" at 5:30AM  Call this number if you have problems the morning of surgery:  [phone]   Remember:  Do not eat or drink after midnight on March 9th    Take these medicines the morning of surgery with A SIP OF WATER: Atorvastatin (LIPITOR), Carvedilol (COREG), CloNIDine (CATAPRES), and Gabapentin (NEURONTIN)   If needed: Acetaminophen (TYLENOL)   Follow your surgeon's instructions on when to stop Aspirin and Plavix.  If no instructions were given by your surgeon then you will need to call the office to get those instructions.    As of today, stop taking all Other Aspirin Products, Vitamins, Fish oils, and Herbal medications. Also stop all NSAIDS i.e. Advil, Ibuprofen, Motrin, Aleve, Anaprox, Naproxen, BC, Goody Powders, and all Supplements.   . THE NIGHT BEFORE SURGERY, take ____10_______ units of _____Humulin N______insulin.   . THE MORNING OF SURGERY, take ______0_______ units of _____Humalog_____insulin.   How to Manage Your Diabetes Before and After Surgery  Why is it important to control my blood sugar before and after surgery? . Improving blood sugar levels before and after surgery helps healing and can limit problems. . A way of improving blood sugar control is eating a healthy diet by: o  Eating less sugar and carbohydrates o  Increasing activity/exercise o  Talking with your doctor about reaching your blood sugar goals . High blood sugars (greater than 180 mg/dL) can raise your risk of infections and slow your recovery, so you will need to focus on controlling your diabetes during the weeks before surgery. . Make sure  that the doctor who takes care of your diabetes knows about your planned surgery including the date and location.  How do I manage my blood sugar before surgery? . Check your blood sugar at least 4 times a day, starting 2 days before surgery, to make sure that the level is not too high or low. o Check your blood sugar the morning of your surgery when you wake up and every 2 hours until you get to the Short Stay unit. . If your blood sugar is less than 70 mg/dL, you will need to treat for low blood sugar: o Do not take insulin. o Treat a low blood sugar (less than 70 mg/dL) with  cup of clear juice (cranberry or apple), 4 glucose tablets, OR glucose gel. Recheck blood sugar in 15 minutes after treatment (to make sure it is greater than 70 mg/dL). If your blood sugar is not greater than 70 mg/dL on recheck, call [phone] o  for further instructions. . If your CBG is greater than 220 mg/dL, you may take  of your sliding scale (correction) dose of insulin.  . If you are admitted to the hospital after surgery: o Your blood sugar will be checked by the staff and you will probably be given insulin after surgery (instead of oral diabetes medicines) to make sure you have good blood sugar levels. o The goal for blood sugar control after surgery is 80-180 mg/dL.  Reviewed and Endorsed by [facility] Patient Education Committee, August 2015     Do not wear jewelry, make-up or nail  polish.  Do not wear lotions, powders, or perfumes, or deodorant.  Do not shave 48 hours prior to surgery.    Do not bring valuables to the hospital.  [facility] is not responsible for any belongings or valuables.  Contacts, dentures or bridgework may not be worn into surgery.  Leave your suitcase in the car.  After surgery it may be brought to your room.  For patients admitted to the hospital, discharge time will be determined by your treatment team.  Patients discharged the day of surgery will not be allowed to  drive home.   Special instructions:   [place]- Preparing For Surgery  Before surgery, you can play an important role. Because skin is not sterile, your skin needs to be as free of germs as possible. You can reduce the number of germs on your skin by washing with CHG (chlorahexidine gluconate) Soap before surgery.  CHG is an antiseptic cleaner which kills germs and bonds with the skin to continue killing germs even after washing.    Oral Hygiene is also important to reduce your risk of infection.  Remember - BRUSH YOUR TEETH THE MORNING OF SURGERY WITH YOUR REGULAR TOOTHPASTE  Please do not use if you have an allergy to CHG or antibacterial soaps. If your skin becomes reddened/irritated stop using the CHG.  Do not shave (including legs and underarms) for at least 48 hours prior to first CHG shower. It is OK to shave your face.  Please follow these instructions carefully.   1. Shower the NIGHT BEFORE SURGERY and the MORNING OF SURGERY with CHG.   2. If you chose to wash your hair, wash your hair first as usual with your normal shampoo.  3. After you shampoo, rinse your hair and body thoroughly to remove the shampoo.  4. Use CHG as you would any other liquid soap. You can apply CHG directly to the skin and wash gently with a scrungie or a clean washcloth.   5. Apply the CHG Soap to your body ONLY FROM THE NECK DOWN.  Do not use on open wounds or open sores. Avoid contact with your eyes, ears, mouth and genitals (private parts). Wash Face and genitals (private parts)  with your normal soap.  6. Wash thoroughly, paying special attention to the area where your surgery will be performed.  7. Thoroughly rinse your body with warm water from the neck down.  8. DO NOT shower/wash with your normal soap after using and rinsing off the CHG Soap.  9. Pat yourself dry with a CLEAN TOWEL.  10. Wear CLEAN PAJAMAS to bed the night before surgery, wear comfortable clothes the morning of  surgery  11. Place CLEAN SHEETS on your bed the night of your first shower and DO NOT SLEEP WITH PETS.  Day of Surgery:  Do not apply any deodorants/lotions.  Please wear clean clothes to the hospital/surgery center.   Remember to brush your teeth WITH YOUR REGULAR TOOTHPASTE.  Please read over the following fact sheets that you were given. Pain Booklet, Coughing and Deep Breathing, MRSA Information and Surgical Site Infection Prevention

## 2018-06-30 ENCOUNTER — Encounter (HOSPITAL_COMMUNITY): Payer: Self-pay

## 2018-06-30 ENCOUNTER — Encounter (HOSPITAL_COMMUNITY)
Admission: RE | Admit: 2018-06-30 | Discharge: 2018-06-30 | Disposition: A | Discharge status: Home / Self Care | Payer: Medicare Other | Source: Ambulatory Visit | Attending: Orthopaedic Surgery | Admitting: Orthopaedic Surgery

## 2018-06-30 ENCOUNTER — Other Ambulatory Visit: Payer: Self-pay

## 2018-06-30 DIAGNOSIS — Z01812 Encounter for preprocedural laboratory examination: Secondary | ICD-10-CM | POA: Insufficient documentation

## 2018-06-30 HISTORY — DX: Foot drop, left foot: M21.372

## 2018-06-30 LAB — HEMOGLOBIN A1C
Hgb A1c MFr Bld: 11 % — ABNORMAL HIGH (ref 4.8–5.6)
Mean Plasma Glucose: 269 mg/dL

## 2018-06-30 LAB — COMPREHENSIVE METABOLIC PANEL
ALBUMIN: 2 g/dL — AB (ref 3.5–5.0)
ALK PHOS: 206 U/L — AB (ref 38–126)
ALT: 29 U/L (ref 0–44)
ANION GAP: 13 (ref 5–15)
AST: 24 U/L (ref 15–41)
BILIRUBIN TOTAL: 0.7 mg/dL (ref 0.3–1.2)
BUN: 25 mg/dL — ABNORMAL HIGH (ref 6–20)
CALCIUM: 8.6 mg/dL — AB (ref 8.9–10.3)
CO2: 23 mmol/L (ref 22–32)
Chloride: 98 mmol/L (ref 98–111)
Creatinine, Ser: 9.54 mg/dL — ABNORMAL HIGH (ref 0.44–1.00)
GFR calc non Af Amer: 4 mL/min — ABNORMAL LOW (ref >60)
GFR, EST AFRICAN AMERICAN: 5 mL/min — AB (ref >60)
GLUCOSE: 207 mg/dL — AB (ref 70–99)
POTASSIUM: 2.9 mmol/L — AB (ref 3.5–5.1)
Sodium: 134 mmol/L — ABNORMAL LOW (ref 135–145)
TOTAL PROTEIN: 6.9 g/dL (ref 6.5–8.1)

## 2018-06-30 LAB — CBC
HEMATOCRIT: 38.4 % (ref 36.0–46.0)
HEMOGLOBIN: 12.2 g/dL (ref 12.0–15.0)
MCH: 26.2 pg (ref 26.0–34.0)
MCHC: 31.8 g/dL (ref 30.0–36.0)
MCV: 82.4 fL (ref 80.0–100.0)
Platelets: 336 10*3/uL (ref 150–400)
RBC: 4.66 MIL/uL (ref 3.87–5.11)
RDW: 13.5 % (ref 11.5–15.5)
WBC: 9.8 10*3/uL (ref 4.0–10.5)
nRBC: 0 % (ref 0.0–0.2)

## 2018-06-30 LAB — APTT: APTT: 33 s (ref 24–36)

## 2018-06-30 LAB — PROTIME-INR
INR: 1.1 (ref 0.8–1.2)
PROTHROMBIN TIME: 14.1 s (ref 11.4–15.2)

## 2018-06-30 LAB — GLUCOSE, CAPILLARY: Glucose-Capillary: 219 mg/dL — ABNORMAL HIGH (ref 70–99)

## 2018-06-30 LAB — SURGICAL PCR SCREEN
MRSA, PCR: NEGATIVE
Staphylococcus aureus: NEGATIVE

## 2018-06-30 NOTE — Pre-Procedure Instructions (Signed)
Priscilla [name]  06/30/2018      [id number] - [place], [address] [place] [address] Phone: [phone] Fax: [phone]    Your procedure is scheduled on Tues., July 07, 2018 from 7:30AM-10:23AM  Report to [facility] Entrance "A" at 5:30AM  Call this number if you have problems the morning of surgery:  [phone]   Remember:  Do not eat or drink after midnight on March 9th    Take these medicines the morning of surgery with A SIP OF WATER: Atorvastatin (LIPITOR), Carvedilol (COREG), CloNIDine (CATAPRES), and Gabapentin (NEURONTIN)   If needed: Acetaminophen (TYLENOL)   Follow your surgeon's instructions on when to stop Aspirin and Plavix.  If no instructions were given by your surgeon then you will need to call the office to get those instructions.    As of today, stop taking all Other Aspirin Products, Vitamins, Fish oils, and Herbal medications. Also stop all NSAIDS i.e. Advil, Ibuprofen, Motrin, Aleve, Anaprox, Naproxen, BC, Goody Powders, and all Supplements.   . THE NIGHT BEFORE SURGERY, take ____14_______ units of _____Humulin N______insulin.   If your CBG is greater than 220 mg/dL, you may take  of your sliding scale (correction) dose of  Humalog insulin.   How to Manage Your Diabetes Before and After Surgery  Why is it important to control my blood sugar before and after surgery? . Improving blood sugar levels before and after surgery helps healing and can limit problems. . A way of improving blood sugar control is eating a healthy diet by: o  Eating less sugar and carbohydrates o  Increasing activity/exercise o  Talking with your doctor about reaching your blood sugar goals . High blood sugars (greater than 180 mg/dL) can raise your risk of infections and slow your recovery, so you will need to focus on controlling your diabetes during the  weeks before surgery. . Make sure that the doctor who takes care of your diabetes knows about your planned surgery including the date and location.  How do I manage my blood sugar before surgery? . Check your blood sugar at least 4 times a day, starting 2 days before surgery, to make sure that the level is not too high or low. o Check your blood sugar the morning of your surgery when you wake up and every 2 hours until you get to the Short Stay unit. . If your blood sugar is less than 70 mg/dL, you will need to treat for low blood sugar: o Do not take insulin. o Treat a low blood sugar (less than 70 mg/dL) with  cup of clear juice (cranberry or apple), 4 glucose tablets, OR glucose gel. Recheck blood sugar in 15 minutes after treatment (to make sure it is greater than 70 mg/dL). If your blood sugar is not greater than 70 mg/dL on recheck, call [phone] o  for further instructions.  . If you are admitted to the hospital after surgery: o Your blood sugar will be checked by the staff and you will probably be given insulin after surgery (instead of oral diabetes medicines) to make sure you have good blood sugar levels. o The goal for blood sugar control after surgery is 80-180 mg/dL.  Reviewed and Endorsed by [employer] Patient Education Committee, August 2015     Do not wear jewelry, make-up or nail polish.  Do not wear lotions, powders, or perfumes,  or deodorant.  Do not shave 48 hours prior to surgery.    Do not bring valuables to the hospital.  [facility] is not responsible for any belongings or valuables.  Contacts, dentures or bridgework may not be worn into surgery.  Leave your suitcase in the car.  After surgery it may be brought to your room.  For patients admitted to the hospital, discharge time will be determined by your treatment team.  Patients discharged the day of surgery will not be allowed to drive home.   Special instructions:   [place]- Preparing For  Surgery  Before surgery, you can play an important role. Because skin is not sterile, your skin needs to be as free of germs as possible. You can reduce the number of germs on your skin by washing with CHG (chlorahexidine gluconate) Soap before surgery.  CHG is an antiseptic cleaner which kills germs and bonds with the skin to continue killing germs even after washing.    Oral Hygiene is also important to reduce your risk of infection.  Remember - BRUSH YOUR TEETH THE MORNING OF SURGERY WITH YOUR REGULAR TOOTHPASTE  Please do not use if you have an allergy to CHG or antibacterial soaps. If your skin becomes reddened/irritated stop using the CHG.  Do not shave (including legs and underarms) for at least 48 hours prior to first CHG shower. It is OK to shave your face.  Please follow these instructions carefully.   1. Shower the NIGHT BEFORE SURGERY and the MORNING OF SURGERY with CHG.   2. If you chose to wash your hair, wash your hair first as usual with your normal shampoo.  3. After you shampoo, rinse your hair and body thoroughly to remove the shampoo.  4. Use CHG as you would any other liquid soap. You can apply CHG directly to the skin and wash gently with a scrungie or a clean washcloth.   5. Apply the CHG Soap to your body ONLY FROM THE NECK DOWN.  Do not use on open wounds or open sores. Avoid contact with your eyes, ears, mouth and genitals (private parts). Wash Face and genitals (private parts)  with your normal soap.  6. Wash thoroughly, paying special attention to the area where your surgery will be performed.  7. Thoroughly rinse your body with warm water from the neck down.  8. DO NOT shower/wash with your normal soap after using and rinsing off the CHG Soap.  9. Pat yourself dry with a CLEAN TOWEL.  10. Wear CLEAN PAJAMAS to bed the night before surgery, wear comfortable clothes the morning of surgery  11. Place CLEAN SHEETS on your bed the night of your first shower and  DO NOT SLEEP WITH PETS.  Day of Surgery:  Do not apply any deodorants/lotions.  Please wear clean clothes to the hospital/surgery center.   Remember to brush your teeth WITH YOUR REGULAR TOOTHPASTE.  Please read over the following fact sheets that you were given. Pain Booklet, Coughing and Deep Breathing, MRSA Information and Surgical Site Infection Prevention

## 2018-06-30 NOTE — Progress Notes (Signed)
PCP - Dr. [name] Cardiologist - M. Croitoru   Chest x-ray - N/A EKG - 09/19/17 Stress Test - 04/01/14 ECHO - 05/16/16 Cardiac Cath - 06/28/14  Sleep Study - denies CPAP - denies  Fasting Blood Sugar - 400+ due to peritoneal dialysis Checks Blood Sugar 3 times a day CBG at PAT 219  Blood Thinner Instructions: Hold Plavix 5 days prior to surgery, LD 07/01/18 Aspirin Instructions: continue  Anesthesia review: Yes, Ebony Hail to see pt in PAT.   Patient denies shortness of breath, fever, cough and chest pain at PAT appointment   Patient verbalized understanding of instructions that were given to them at the PAT appointment. Patient was also instructed that they will need to review over the PAT instructions again at home before surgery.

## 2018-06-30 NOTE — Progress Notes (Addendum)
Anesthesia PAT Evaluation:  Case:  [id number] Date/Time:  07/07/18 0715   Procedures:      REPAIR RIGHT COMMON FEMORAL ARTERY TO FEMORAL VEIN FISTULA (Right )     VEIN HARVEST GREATER SAPHENOUS (Left )   Anesthesia type:  General   Pre-op diagnosis:  common femoral arteriovenous fistula   Location:  [facility] OR ROOM 11 / [facility]   Surgeon:  Angelia Mould, MD      DISCUSSION: Patient is a 48 year old female scheduled for the above procedure. A right groin AVFwas noted on May 2019 peripheral arteriogram. It is thought to likely have been present since her 2016 cardiac cath. She had iliac stents at that time. Although the AVF was not causing her symptoms, her right foot wound has not been healing so Dr. Scot Dock felt it should be addressed.  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   Last visit with cardiologist Dr. Sallyanne Kuster was on 08/02/16. He wrote,  "Last year she underwent repeat evaluation for another transplant at [facility] in [place]. Part of this included cardiac catheterization. She tells me that she had an attempted percutaneous angioplasty in September 2017 which failed. After the procedure she was informed that she was declined for repeat transplantation. She has already been declined for transplantation at [facility] and [place]. She had been on the transplant list at [facility], but after these events became very discouraged and has not followed up.Marland KitchenMarland KitchenMarland KitchenShe does not have problems with angina pectoris. Need to get the records from [place] regarding her attempted PCI. Since she does not have angina pectoris, no clear indication for any  new attempts at revascularization, except to the degree that they may have impact on her transplant candidacy. Refer to cardiac rehabilitation after her NSTEMI in January/depressed LVEF. She'd benefit from return to regular physical activity." - I don't see that 2017 [facility] records were ever received by [facility]. Echo in 2018 showed EF 45-50%.   Patient reports on-going endocrinology follow-up for DM1. She was on Tresiba, but had to stop due to hypoglycemic events. She was started on insulin N 20 units Q HS and takes Humalog SSI TID based on CBG > 150 and carbohydrate counts. She runs peritoneal dialysis overnight 10-11 hours M-F. Glucose on awakening can often be in the 300-400 range, but within a couple of hours will be in the 200 range. She has SSI instructions while NPO, and discussed that surgery could be delayed if glucose > 250 on the morning of surgery.   Reviewed information with anesthesiologist Annye Asa, MD. Although patient without acute CV/HF symptoms, it has been nearly two years since last cardiology evaluation. It is also unclear what the results of her 2017 records from [facility] revealed. Will attempt to get records. In the interim, anesthesiologist recommended patient be evaluated for a preoperative cardiology input.   I have notified Dr. Scot Dock and [facility] RN Zigmund Daniel. If cardiology unable to expedite an appointment then Dr. Scot Dock feels it would be safe to postpone surgery for a brief time. I also notified them of poorly controlled DM and potential for delay if patient arrived with a significantly elevated glucose. Kay aware of K 2.9 and plans to contact nephrology for treatment  recommendations.   Chart will be left for follow-up regarding [facility] records and cardiology input. She will need ISTAT4 or equivalent labs (that will need to include K+) and a pregnancy test (serum since she does not produce urine) on the day of surgery given ESRD history. She plans to stop PD by 4:30  AM that day. Will need to clarify with her to make sure she empties fluid, so abdomen is not full for surgery. Last Plavix 07/01/18.   ADDENDUM 07/02/18 5:49 PM:  Records from [facility] received with testing results outlined below under CV section. According to last office visit with cardiologist Layne Benton, MD on 01/16/16 A/P included:  -Based on 2017 LHC, no targets for vascularization -Based on 2017 echo and RHC, moderate MR, clinically stable -EF 35% and felt back to prior numbers -Based on current information, Dr. Maryan Rued felt she was a high risk patient with advanced CAD, EF 35%, and that her MR was probably overestimated due to MR. He felt she was not a candidate for any revascularization. Known CTO of RCA and LCX territories, but no good target for redo surgery.  She saw Almyra Deforest, [place] this morning for preoperative cardiology evaluation. I forwarded him 2017 [facility] records which he reviewed. He wrote (see Letters tab), "She is cleared to proceed with vascular surgery as a moderate risk patient given her previous cardiac and renal history. No further cardiac workup is needed prior to surgery. Patient was also seen today by her primary cardiologist Dr. Sallyanne Kuster who also agreed with the clearance."   Patient confirmed with me that she would start PD early the evening before surgery, so she would be able to complete her usual cycle before coming in for surgery (so abdomen is not full). Hopefully, CBG will be reasonable on the morning of surgery.    VS: BP (!) 147/71   Pulse 79   Temp 36.6 C   Resp 18   Ht 5' (1.524 m)   Wt 54.5 kg   SpO2 100%   BMI 23.44 kg/m  Patient is a very pleasant black female in NAD. She is in a hospital wheelchair, but can ambulate with some support from a cane due to LLE foot drop and now with right great toe wound. Reported she tripped prior to coming into PAT, but denied injury. Stairs are difficult for her as well due to her leg. She denied chest pain. SOB,  persistent palpitations. No significant edema. She works in Therap[employer], art. Heart RRR with occasional ectopic beat. No murmur noted. Lung clear. Trace ankle edema.   PROVIDERS: [name], MD is PCP [name], MD is cardiologist. Last visit 08/02/16.  Modesto Charon, MD is CT surgeon Pearson Grippe, MD is nephrologist. Reported monthly visits with labs. Jacelyn Pi, MD is endocrinologist   LABS: Preoperative labs noted. K 2.9 (called to [facility] RN Zigmund Daniel who plans to contact nephrologist). A1c 11.0. CBC, PT/PTT WNL. She will need ISTAT4 or equivalent labs on the day of surgery. (all labs ordered are listed, but only abnormal results are displayed)  Labs Reviewed  GLUCOSE, CAPILLARY - Abnormal; Notable for the following components:      Result Value   Glucose-Capillary 219 (*)    All other components within normal limits    IMAGES: CTA Ao+BiFem 09/07/17: IMPRESSION: VASCULAR - Diffuse arterial calcifications within the arterial tree throughout the abdomen pelvis and bilateral lower extremities most consistent with the known history of end-stage renal disease. - Multifocal areas of moderate to  high-grade stenosis within the superficial femoral arteries bilaterally. Single-vessel runoff via the anterior tibial arteries is noted bilaterally. NON-VASCULAR - Changes of prior right pelvic renal transplant which have failed with significant calcification of the transplant kidney and occlusion of the arterial inflow. - Peritoneal dialysis catheter as described with a small amount of ascites. - Lingular and left lower lobe infiltrate with associated small effusion. - Small fat containing anterior superior abdominal wall hernias related to prior surgery. - IUD in place.   EKG: 09/19/17:  Normal sinus rhythm Right bundle branch block Septal infarct , age undetermined Abnormal ECG No significant change since 05/15/16 Confirmed by Jenkins Rouge [phone]) on 09/19/2017 5:17:49  PM   CV: Aortogram with bilateral iliac arteriogram 09/19/17: 1.  There is no significant infrarenal aortic disease. 2.  There were bilateral tight common iliac artery stenoses which were addressed with balloon angioplasty and stenting as described above with no residual stenosis. 3.  On the right side the hypogastric and external iliac arteries are patent.  The common femoral deep femoral and superficial femoral arteries are patent.  There is mild diffuse disease throughout the superficial femoral artery.  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  Below this the anterior tibial and peroneal artery are patent although the peroneal is occluded distally.  The posterior tibial artery on the right is occluded. 4.  On the left side the external iliac and hypogastric arteries are patent.  The common femoral, superficial femoral and deep femoral arteries are patent.  There is mild diffuse disease throughout the superficial femoral artery on the left.  There is single-vessel runoff in the left via the anterior tibial artery. CLINICAL NOTE: I have started her on Plavix with which I think she should continue for 6 months.  Echo 05/16/16: Study Conclusions - Left ventricle: Inferior and septal hypokinesis. The cavity size   was normal. Wall thickness was normal. Systolic function was   mildly reduced. The estimated ejection fraction was in the range   of 45% to 50%. The study is not technically sufficient to allow   evaluation of LV diastolic function. - Mitral valve: Severe annular calcification and leaflet thickening   Small gradient across MV but no MS   by P 1/2T. There was mild regurgitation. Valve area by pressure   half-time: 2.27 cm^2. Valve area by continuity equation (using   LVOT flow): 0.41 cm^2. - Left atrium: The atrium was mildly dilated. - Atrial septum: No defect or patent foramen ovale was identified. - Pulmonary  arteries: PA peak pressure: 38 mm Hg (S). (Comparison: LVEF 30-35% with inferior and posterior wall akinesis, moderately reduced RV systolic function, mild-moderate MR, moderate-severe MS, mild AS, mild-moderate TR, RVSP 34-49 mmHg 01/05/16 @ [facility]; EF 55-60% 10/25/14, 35-40% 05/06/14)  Cardiac cath 01/06/16 ([place]): Left main: Large caliber bifurcating vessel with 60% distal stenosis. LAD: Moderate caliber vessel with severe diffuse disease 90-95% stenosis in the proximal segment that fills retrograde from a patent LIMA graft. D1: Small caliber vessel with severe diffuse disease. D2: Small caliber vessel with severe diffuse disease. D3: Small caliber vessel with severe diffuse disease. LCX: Moderate caliber vessel with severe diffuse disease chronic total occlusion in the proximal segment with left to left collaterals. LIMA-LAD graft: Patent  LV: 124/1/20 mmHg Ao: 132/57/88 mmHg Distal abdominal aorta/bilateral CFA angiography: Patent.  No evidence of dissection or perforation or significant stenosis.  The sheath is high,  above the bifurcation and just below the epigastric artery. Attempted PCI: Multiple attempts were made to cross the LCX CTO antegrade unsuccessfully. IMPRESSION: 1.  Unsuccessful attempt at PCI of LCX CTO.  Severe calcification and tortuous CTO.  No collateral branches to attempt retrograde attempt. 2.  Elevated LVEDP. Comment: Patient also has CTO of RCA but very poor distal circulation. No target for any revascularization.  Will order echo to determine LV function.  She does not have ICD and prior echo EF was 25 to 30%.  Based on LV function, advanced CAD, not candidate for revascularization by PCI nor redo CABG.  She is a high risk patient for consideration for renal transplant.  If new echo shows EF less than 35% we will discuss ICD implantation. - 01/05/16 echo at [facility] showed: LVEF 30-35%,    inferior and posterior wall akinesis, moderately reduced RV systolic  function, mild-moderate MR, moderate-severe MS, mild AS, mild-moderate TR, RVSP 34-49 mmHg 01/05/16  According to 01/16/16 office note by Layne Benton, MD: - PCI (attempted) 12/2015: Failed attempt to open LCX CTO - LHC/RHC 10/2015: 1.  Severe ischemic cardiomyopathy with diffusely severely diseased small caliber vessels, not amenable for revascularization. 2.  Patent LIMA to LAD, stump occluded SVG-RCA, proximal CTO of the LCX and RCA. 3.  Mild pulmonary hypertension. 4.  Normal left cardiac filling pressures. 5.  Increased V wave suggestive of at least moderate mitral regurgitation, s/p mitral annuloplasty. 6.  Elevated right cardiac pressures. 7.  No significant transaortic valve gradient. RA: 49/67/59 RV systolic/diastolic: 16/3/84 PCWP mean: 66/59/93 PA systolic/diastolic/mean: 57/01/77 PA sat: 52.6 Ao sat: 93.8 LV: 122/2/16 Ao: 123/67/91 Estimated Fick CO: 3.45 Estimated Fick Cl: 2.35 - Stress test 09/2015 showed:  Large size severe great inferolateral defect.This defect is partially fixed and partially reversible on rest images and is consistent with myocardial scarring with peri-infarct ischemia. Approximately 13% of left ventricular myocardium shows reversibility.  There is global hypokinesis with a reduced left ventricular ejection fraction of 30%.  Past Medical History:  Diagnosis Date  . Anemia   . CAD (coronary artery disease)   . Cardiomyopathy, ischemic 06/24/2014  . Diabetes mellitus without complication ([place])   . End stage renal disease ([place])   . Foot drop, left foot   . GERD (gastroesophageal reflux disease)   . H/O kidney transplant   . Hyperlipidemia   . Hypertension   . Myocardial infarction ([place])   . Pancreas transplanted ([place])   . Renal disorder   . 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 S/P mitral valve repair 07/18/14    Past Surgical History:  Procedure Laterality Date  . ABDOMINAL AORTOGRAM W/LOWER EXTREMITY N/A 09/19/2017   Procedure: ABDOMINAL AORTOGRAM W/LOWER  EXTREMITY;  Surgeon: Angelia Mould, MD;  Location: [place] CV LAB;  Service: Cardiovascular;  Laterality: N/A;  . CAPD INSERTION N/A 09/08/2013   Procedure: Laparoscopic CAPD peritoneal dialysis catheter placement, possible lysis of adhesions   ;  Surgeon: Adin Hector, MD;  Location: [place];  Service: General;  Laterality: N/A;  . CARDIAC CATHETERIZATION     06/2014  . CESAREAN SECTION    . COMBINED KIDNEY-PANCREAS TRANSPLANT  05/2010   [facility]..  Pancreas left mid abdomen  . CORONARY ARTERY BYPASS GRAFT N/A 07/18/2014   Procedure: CORONARY ARTERY BYPASS GRAFTING (CABG)X2 LIMA-LAD; SVG-PD;  Surgeon: Melrose Nakayama, MD;  Location: [place];  Service: Open Heart Surgery;  Laterality: N/A;  . INSERTION OF DIALYSIS CATHETER  September 28, 2012   Right upper chest  . LAPAROSCOPIC INSERTION PERITONEAL CATHETER  07/18/2008   Dr Excell Seltzer  . LAPAROSCOPIC REPOSITIONING CAPD CATHETER  06/04/2008   Dr Excell Seltzer  . LAPAROSCOPIC REPOSITIONING CAPD CATHETER  11/08/2008   Dr Excell Seltzer  . LAPAROSCOPIC REPOSITIONING CAPD CATHETER  10/03/2009   Unplugging CAPD catheter Dr Excell Seltzer  . LEFT HEART CATHETERIZATION WITH CORONARY ANGIOGRAM N/A 06/28/2014   Procedure: LEFT HEART CATHETERIZATION WITH CORONARY ANGIOGRAM;  Surgeon: [name], MD;  Location: [place] CATH LAB;  Service: Cardiovascular;  Laterality: N/A;  . MITRAL VALVE REPAIR N/A 07/18/2014   Procedure: MITRAL VALVE REPAIR (MVR);  Surgeon: Melrose Nakayama, MD;  Location: [place];  Service: Open Heart Surgery;  Laterality: N/A;  . OMENTECTOMY  11/08/2008   Dr Excell Seltzer  . PERIPHERAL VASCULAR INTERVENTION Bilateral 09/19/2017   Procedure: PERIPHERAL VASCULAR INTERVENTION;  Surgeon: Angelia Mould, MD;  Location: [place] CV LAB;  Service: Cardiovascular;  Laterality: Bilateral;  Iliac stents  . TEE WITHOUT CARDIOVERSION N/A 07/18/2014   Procedure: TRANSESOPHAGEAL ECHOCARDIOGRAM (TEE);  Surgeon: Melrose Nakayama, MD;   Location: [place];  Service: Open Heart Surgery;  Laterality: N/A;    MEDICATIONS: . acetaminophen (TYLENOL) 325 MG tablet  . amitriptyline (ELAVIL) 10 MG tablet  . aspirin 325 MG tablet  . atorvastatin (LIPITOR) 20 MG tablet  . calcitRIOL (ROCALTROL) 0.5 MCG capsule  . calcium acetate (PHOSLO) 667 MG capsule  . carvedilol (COREG) 6.25 MG tablet  . cloNIDine (CATAPRES) 0.1 MG tablet  . clopidogrel (PLAVIX) 75 MG tablet  . CREON 36000 units CPEP capsule  . gabapentin (NEURONTIN) 100 MG capsule  . HUMULIN N 100 UNIT/ML injection  . insulin lispro (HUMALOG) 100 UNIT/ML injection  . levonorgestrel (MIRENA) 20 MCG/24HR IUD  . loperamide (IMODIUM) 2 MG capsule  . losartan (COZAAR) 100 MG tablet   No current facility-administered medications for this encounter.   Last Plavix 07/01/18.   Myra Gianotti, PA-C Surgical Short Stay/Anesthesiology [facility] Phone [phone] [facility] Phone [phone] 06/30/2018 6:59 PM

## 2018-07-02 ENCOUNTER — Ambulatory Visit (INDEPENDENT_AMBULATORY_CARE_PROVIDER_SITE_OTHER): Payer: Medicare Other | Admitting: Physician Assistant

## 2018-07-02 ENCOUNTER — Encounter: Payer: Self-pay | Admitting: Physician Assistant

## 2018-07-02 VITALS — BP 117/71 | HR 73 | Ht 60.0 in | Wt 125.4 lb

## 2018-07-02 DIAGNOSIS — I25810 Atherosclerosis of coronary artery bypass graft(s) without angina pectoris: Secondary | ICD-10-CM

## 2018-07-02 DIAGNOSIS — Z01810 Encounter for preprocedural cardiovascular examination: Secondary | ICD-10-CM | POA: Diagnosis not present

## 2018-07-02 DIAGNOSIS — Z992 Dependence on renal dialysis: Secondary | ICD-10-CM

## 2018-07-02 DIAGNOSIS — E785 Hyperlipidemia, unspecified: Secondary | ICD-10-CM | POA: Diagnosis not present

## 2018-07-02 DIAGNOSIS — Z9889 Other specified postprocedural states: Secondary | ICD-10-CM | POA: Diagnosis not present

## 2018-07-02 DIAGNOSIS — I10 Essential (primary) hypertension: Secondary | ICD-10-CM

## 2018-07-02 DIAGNOSIS — N186 End stage renal disease: Secondary | ICD-10-CM

## 2018-07-02 NOTE — Patient Instructions (Signed)
Medication Instructions:  Your physician recommends that you continue on your current medications as directed. Please refer to the Current Medication list given to you today.  If you need a refill on your cardiac medications before your next appointment, please call your pharmacy.   Lab work: NONE    If you have labs (blood work) drawn today and your tests are completely normal, you will receive your results only by: Marland Kitchen MyChart Message (if you have MyChart) OR . A paper copy in the mail If you have any lab test that is abnormal or we need to change your treatment, we will call you to review the results.  Testing/Procedures: NONE   Follow-Up: At [facility], you and your health needs are our priority.  As part of our continuing mission to provide you with exceptional heart care, we have created designated Provider Care Teams.  These Care Teams include your primary Cardiologist (physician) and Advanced Practice Providers (APPs -  Physician Assistants and Nurse Practitioners) who all work together to provide you with the care you need, when you need it. You will need a follow up appointment in 6 months.  Please call our office 2 months in advance to schedule this appointment.  You may see [name], MD or one of the following Advanced Practice Providers on your designated Care Team: [place], [place] . Fabian Sharp, PA-C  Any Other Special Instructions Will Be Listed Below (If Applicable).

## 2018-07-02 NOTE — Progress Notes (Signed)
Cardiology Office Note    Date:  07/04/2018   ID:  Priscilla [name], DOB 01-May-1970, MRN [id number]  PCP:  [name], MD  Cardiologist: Dr. Sallyanne Kuster Nephrologist: Dr. Joelyn Oms  Chief Complaint  Patient presents with  . Medical Clearance    upcoming vascular surgery by Dr. Scot Dock    History of Present Illness:  Priscilla [name] is a 48 y.o. female with PMH of type I DM, hypertension, hyperlipidemia, ESRD on PD, history of ischemic cardiomyopathy, mitral valve repair, and CAD s/p CABG x2 (LIMA to LAD, SVG to PDA in 2016 by Dr. Roxan Hockey).  During his procedure in 2016, she also had mitral valve repair with #28 memory for severe MR and PFO closure.  Prior to her bypass surgery, her EF was 35 to 40%, this has improved after the bypass surgery.  She was evaluated for renal transplant at [facility] in [place] in 2017.  Cardiac catheterization performed on 01/05/2016 showed CTO of RCA which he was previously known, also CTO of left circumflex, EF 25%.  Attempt was made to open up the CTO of left circumflex artery, however this was unsuccessful.  Patient was turned down for transplant.  Last echocardiogram in January 2018 showed EF back to 45 to 50%.  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  Her last appointment with Dr. Sallyanne Kuster was on 08/02/2016, at which time she was doing well.  In May 2019, she had angioplasty and stenting of the bilateral iliac arteries.  It was incidentally found she has a fistula on the right side between the distal common femoral artery and the common femoral vein which is likely related to previous cardiac catheterization.  She was most recently seen by Dr. Scot Dock on 06/24/2018, at which time she continued to have claudication symptoms.  She also has a wound on her foot that is not healing, it this was felt to be secondary to the AV fistula in the groin.  Patient presents today for cardiac clearance.   She has stopped her Plavix in anticipation of the surgery.  Her functional ability is quite poor due to claudication.  She cannot walk up a single flight of stairs.  She denies any recent chest pain or shortness of breath.  Her overall volume seems to be euvolemic controlled on peritoneal dialysis.  She denies any PND or orthopnea symptoms.  I discussed her case with Dr. Sallyanne Kuster who also saw her in the clinic today, she is deemed a moderate risk patient to proceed with upcoming procedure.  She is cleared from cardiac perspective without further work-up.   Past Medical History:  Diagnosis Date  . Anemia   . CAD (coronary artery disease)   . Cardiomyopathy, ischemic 06/24/2014  . Diabetes mellitus without complication ([place])   . End stage renal disease ([place])   . Foot drop, left foot   . GERD (gastroesophageal reflux disease)   . H/O kidney transplant   . Hyperlipidemia   . Hypertension   . Myocardial infarction ([place])   . 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 Renal disorder   . 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 S/P mitral valve repair 07/18/14    Past Surgical History:  Procedure Laterality Date  . ABDOMINAL AORTOGRAM W/LOWER EXTREMITY N/A 09/19/2017   Procedure: ABDOMINAL AORTOGRAM W/LOWER EXTREMITY;  Surgeon: Angelia Mould, MD;  Location: [place] CV LAB;  Service: Cardiovascular;  Laterality: N/A;  . CAPD INSERTION N/A 09/08/2013  Procedure: Laparoscopic CAPD peritoneal dialysis catheter placement, possible lysis of adhesions   ;  Surgeon: Adin Hector, MD;  Location: [place];  Service: General;  Laterality: N/A;  . CARDIAC CATHETERIZATION     06/2014  . CESAREAN SECTION    . COMBINED KIDNEY-PANCREAS TRANSPLANT  05/2010   [facility]  Pancreas left mid abdomen  . CORONARY ARTERY BYPASS GRAFT N/A 07/18/2014   Procedure: CORONARY ARTERY BYPASS GRAFTING (CABG)X2 LIMA-LAD; SVG-PD;  Surgeon: Melrose Nakayama, MD;  Location: [place];  Service: Open Heart Surgery;  Laterality: N/A;    . INSERTION OF DIALYSIS CATHETER  September 28, 2012   Right upper chest  . LAPAROSCOPIC INSERTION PERITONEAL CATHETER  07/18/2008   Dr Excell Seltzer  . LAPAROSCOPIC REPOSITIONING CAPD CATHETER  05/24/2008   Dr Excell Seltzer  . LAPAROSCOPIC REPOSITIONING CAPD CATHETER  11/08/2008   Dr Excell Seltzer  . LAPAROSCOPIC REPOSITIONING CAPD CATHETER  10/03/2009   Unplugging CAPD catheter Dr Excell Seltzer  . LEFT HEART CATHETERIZATION WITH CORONARY ANGIOGRAM N/A 06/28/2014   Procedure: LEFT HEART CATHETERIZATION WITH CORONARY ANGIOGRAM;  Surgeon: [name], MD;  Location: [place] CATH LAB;  Service: Cardiovascular;  Laterality: N/A;  . MITRAL VALVE REPAIR N/A 07/18/2014   Procedure: MITRAL VALVE REPAIR (MVR);  Surgeon: Melrose Nakayama, MD;  Location: [place];  Service: Open Heart Surgery;  Laterality: N/A;  . OMENTECTOMY  11/08/2008   Dr Excell Seltzer  . PERIPHERAL VASCULAR INTERVENTION Bilateral 09/19/2017   Procedure: PERIPHERAL VASCULAR INTERVENTION;  Surgeon: Angelia Mould, MD;  Location: [place] CV LAB;  Service: Cardiovascular;  Laterality: Bilateral;  Iliac stents  . TEE WITHOUT CARDIOVERSION N/A 07/18/2014   Procedure: TRANSESOPHAGEAL ECHOCARDIOGRAM (TEE);  Surgeon: Melrose Nakayama, MD;  Location: [place];  Service: Open Heart Surgery;  Laterality: N/A;    Current Medications: Outpatient Medications Prior to Visit  Medication Sig Dispense Refill  . acetaminophen (TYLENOL) 325 MG tablet Take 650 mg by mouth every 4 (four) hours as needed for mild pain.    Marland Kitchen amitriptyline (ELAVIL) 10 MG tablet Take 10 mg by mouth at bedtime as needed for sleep.   3  . aspirin 325 MG tablet Take 325 mg by mouth daily.     Marland Kitchen atorvastatin (LIPITOR) 20 MG tablet Take 20 mg by mouth daily.    . calcitRIOL (ROCALTROL) 0.5 MCG capsule Take 0.5 mcg by mouth daily.    . calcium acetate (PHOSLO) 667 MG capsule Take 2,668 mg by mouth 3 (three) times daily with meals.     . carvedilol (COREG) 6.25 MG tablet TAKE 1/2 TABLET TWICE A DAY X  3 WEEKS THEN INCREASE TO 1 TABLET TWICE A DAY (Patient taking differently: Take 6.25 mg by mouth 2 (two) times daily with a meal. ) 60 tablet 6  . cloNIDine (CATAPRES) 0.1 MG tablet Take 0.1 mg by mouth daily.     . clopidogrel (PLAVIX) 75 MG tablet TAKE 1 TABLET BY MOUTH DAILY (Patient taking differently: Take 75 mg by mouth daily. ) 30 tablet 5  . CREON 36000 units CPEP capsule Take 72,000-216,000 Units by mouth See admin instructions. 216,000 (3 caps) with meals, and 72,000 (2 caps) with snacks    . gabapentin (NEURONTIN) 100 MG capsule Take 100 mg by mouth 3 (three) times daily.    Marland Kitchen HUMULIN N 100 UNIT/ML injection Inject 20 Units into the skin at bedtime.     . insulin lispro (HUMALOG) 100 UNIT/ML injection Inject 1-10 Units into the skin 3 (three) times  daily before meals. Per sliding scale    . levonorgestrel (MIRENA) 20 MCG/24HR IUD 1 each by Intrauterine route once.    . loperamide (IMODIUM) 2 MG capsule Take 2 mg by mouth every 8 (eight) hours as needed for diarrhea or loose stools.    Marland Kitchen losartan (COZAAR) 100 MG tablet Take 50 mg by mouth daily.      No facility-administered medications prior to visit.      Allergies:   Pork-derived products   Social History   Socioeconomic History  . Marital status: Divorced    Spouse name: Not on file  . Number of children: 1  . Years of education: Not on file  . Highest education level: Not on file  Occupational History  . 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 Financial resource strain: Not on file  . Food insecurity:    Worry: Not on file    Inability: Not on file  . Transportation needs:    Medical: Not on file    Non-medical: Not on file  Tobacco Use  . Smoking status: Never Smoker  . Smokeless tobacco: Never Used  Substance and Sexual Activity  . Alcohol use: No    Alcohol/week: 0.0 standard drinks  . Drug use: No  . Sexual activity: Not Currently    Birth control/protection: None  Lifestyle  . Physical  activity:    Days per week: Not on file    Minutes per session: Not on file  . Stress: Not on file  Relationships  . Social connections:    Talks on phone: Not on file    Gets together: Not on file    Attends religious service: Not on file    Active member of club or organization: Not on file    Attends meetings of clubs or organizations: Not on file    Relationship status: Not on file  Other Topics Concern  . Not on file  Social History Narrative  . Not on file     Family History:  The patient's family history includes CAD in her mother; COPD in her father; Stroke in her mother.   ROS:   Please see the history of present illness.    ROS All other systems reviewed and are negative.   PHYSICAL EXAM:   VS:  BP 117/71   Pulse 73   Ht 5' (1.524 m)   Wt 125 lb 6.4 oz (56.9 kg)   BMI 24.49 kg/m    GEN: Well nourished, well developed, in no acute distress  HEENT: normal  Neck: no JVD, carotid bruits, or masses Cardiac: RRR; no murmurs, rubs, or gallops,no edema.  R groin bruit Respiratory:  clear to auscultation bilaterally, normal work of breathing GI: soft, nontender, nondistended, + BS MS: no deformity or atrophy  Skin: warm and dry, no rash Neuro:  Alert and Oriented x 3, Strength and sensation are intact Psych: euthymic mood, full affect  Wt Readings from Last 3 Encounters:  07/02/18 125 lb 6.4 oz (56.9 kg)  06/30/18 120 lb 0.7 oz (54.5 kg)  06/24/18 120 lb 0.7 oz (54.5 kg)      Studies/Labs Reviewed:   EKG:  EKG is ordered today.  The ekg ordered today demonstrates normal sinus rhythm, right bundle branch block  Recent Labs: 06/30/2018: ALT 29; BUN 25; Creatinine, Ser 9.54; Hemoglobin 12.2; Platelets 336; Potassium 2.9; Sodium 134   Lipid Panel    Component Value Date/Time   CHOL 196 01/16/2015 0912  TRIG 76 01/16/2015 0912   HDL 57 01/16/2015 0912   CHOLHDL 3.4 01/16/2015 0912   VLDL 15 01/16/2015 0912   LDLCALC 124 01/16/2015 0912    Additional  studies/ records that were reviewed today include:   Cath 06/28/2014 Angiographic Findings:  1. The left main coronary artery appears calcified and has very mild ostial disease, but is free of significant stenoses and bifurcates in the usual fashion into the left anterior descending artery and left circumflex coronary artery.  2. The left anterior descending artery is a large-territory, but small caliber vessel that reaches the apex and generates three major diagonal branches. There is evidence of extensive and diffuse luminal irregularities and moderate calcification. The most important stenosis is a 95% proximal lesion. There is a 50-60% mid vessel stenosis and a 70-80% distal stebnosis. 3. The left circumflex coronary artery is a medium-size vessel non dominant vessel that generates two major oblique marginal arteries. There is evidence of extensive luminal irregularities and moderate calcification. Beyond the first OM (which is the larger branch) there is a 90% stenosis. The second OM is diffusely diseased. 4. The right coronary artery is a small-size dominant vessel that is diffusely diseased, calcified and subtotally occluded before it generates a posterior lateral ventricular system as well as the PDA. These can be seen filling via bridging collaterals and RV marginal collaterals. 5. The left ventricle is normal in size. The left ventricle systolic function is moderately-to-severely decreased with an estimated ejection fraction of 30-35%. Regional wall motion abnormalities are seen. The Mid-basal inferior wall is akinetic. The mid anterior wall is hypokinetic. The apex appears mildly dyskinetic. No left ventricular thrombus is seen. There is moderate to severe mitral insufficiency. The ascending aorta appears normal. There is no aortic valve stenosis by pullback. The left ventricular end-diastolic pressure is 23 mm Hg.      Echo 05/16/2016 LV EF: 45% -   50% Study Conclusions  - Left ventricle:  Inferior and septal hypokinesis. The cavity size   was normal. Wall thickness was normal. Systolic function was   mildly reduced. The estimated ejection fraction was in the range   of 45% to 50%. The study is not technically sufficient to allow   evaluation of LV diastolic function. - Mitral valve: Severe annular calcification and leaflet thickening   Small gradient across MV but no MS   by P 1/2T. There was mild regurgitation. Valve area by pressure   half-time: 2.27 cm^2. Valve area by continuity equation (using   LVOT flow): 0.41 cm^2. - Left atrium: The atrium was mildly dilated. - Atrial septum: No defect or patent foramen ovale was identified. - Pulmonary arteries: PA peak pressure: 38 mm Hg (S).  ASSESSMENT:    1. Pre-operative cardiovascular examination   2. H/O mitral valve repair   3. Essential hypertension   4. Hyperlipidemia LDL goal <70   5. Coronary artery disease involving coronary bypass graft of native heart without angina pectoris   6. ESRD on peritoneal dialysis [facility])      PLAN:  In order of problems listed above:  1. Preoperative clearance: Patient has a right groin AV fistula which likely decreased the flow down to the right lower extremity resulting in poor healing ulcer.  She is planning to undergo upcoming vascular surgery by Dr. Scot Dock.  She is cleared to proceed with surgery without further work-up, she was seen with Dr. Sallyanne Kuster today.  2. CAD s/p CABG: Last cardiac catheterization was performed in [place] in  September 2017 as part of evaluation for renal transplant.  She was found to have CTO of both RCA and left circumflex artery, EF was 25% the time, however by 2018, her echocardiogram showed her EF improved to 45%.  Discussed the case with Dr. Sallyanne Kuster, Myoview is unlikely to lower her preoperative risk.  3. Hypertension: Blood pressure stable on current therapy  4. Hyperlipidemia: Continue Lipitor 20 mg daily.  5. End-stage renal disease  on peritoneal dialysis: Managed by nephrology service.  6. History of mitral valve repair: Stable on last echocardiogram in 2018.    Medication Adjustments/Labs and Tests Ordered: Current medicines are reviewed at length with the patient today.  Concerns regarding medicines are outlined above.  Medication changes, Labs and Tests ordered today are listed in the Patient Instructions below. Patient Instructions  Medication Instructions:  Your physician recommends that you continue on your current medications as directed. Please refer to the Current Medication list given to you today.  If you need a refill on your cardiac medications before your next appointment, please call your pharmacy.   Lab work: NONE    If you have labs (blood work) drawn today and your tests are completely normal, you will receive your results only by: Marland Kitchen MyChart Message (if you have MyChart) OR . A paper copy in the mail If you have any lab test that is abnormal or we need to change your treatment, we will call you to review the results.  Testing/Procedures: NONE   Follow-Up: At [facility], you and your health needs are our priority.  As part of our continuing mission to provide you with exceptional heart care, we have created designated Provider Care Teams.  These Care Teams include your primary Cardiologist (physician) and Advanced Practice Providers (APPs -  Physician Assistants and Nurse Practitioners) who all work together to provide you with the care you need, when you need it. You will need a follow up appointment in 6 months.  Please call our office 2 months in advance to schedule this appointment.  You may see [name], MD or one of the following Advanced Practice Providers on your designated Care Team: [place], [place] . Fabian Sharp, PA-C  Any Other Special Instructions Will Be Listed Below (If Applicable).       Hilbert Corrigan, [place]  07/04/2018 11:16 PM    [facility]  [facility], [place], [place]  [address] Phone: [phone]; Fax: [phone]

## 2018-07-02 NOTE — Anesthesia Preprocedure Evaluation (Addendum)
Anesthesia Evaluation  Patient identified by MRN, date of birth, ID band Patient awake    Reviewed: Allergy & Precautions, NPO status , Patient's Chart, lab work & pertinent test results, reviewed documented beta blocker date and time   History of Anesthesia Complications Negative for: history of anesthetic complications  Airway Mallampati: II  TM Distance: >3 FB Neck ROM: Full    Dental  (+) Dental Advisory Given, Teeth Intact   Pulmonary neg pulmonary ROS,    breath sounds clear to auscultation       Cardiovascular hypertension, Pt. on medications and Pt. on home beta blockers (-) angina+ CAD, + CABG (2016 2 vessel) and + Peripheral Vascular Disease  + Valvular Problems/Murmurs (s/p MV repair 2016)  Rhythm:Regular Rate:Normal  '18 ECHO: EF 45-50% with inferior and septal hypokinesis, mild MR   Neuro/Psych negative neurological ROS     GI/Hepatic Neg liver ROS, GERD  Controlled,  Endo/Other  diabetes (glu 137), Insulin DependentH/o pancreas transplant  Renal/GU DialysisRenal disease (peritoneal, IK+ 3.1)H/o kidney transplant 2012     Musculoskeletal   Abdominal   Peds  Hematology plavix   Anesthesia Other Findings   Reproductive/Obstetrics IUD                          Anesthesia Physical Anesthesia Plan  ASA: III  Anesthesia Plan: General   Post-op Pain Management:    Induction: Intravenous  PONV Risk Score and Plan: 3 and Ondansetron, Dexamethasone and Scopolamine patch - Pre-op  Airway Management Planned: Oral ETT  Additional Equipment:   Intra-op Plan:   Post-operative Plan: Extubation in OR  Informed Consent: I have reviewed the patients History and Physical, chart, labs and discussed the procedure including the risks, benefits and alternatives for the proposed anesthesia with the patient or authorized representative who has indicated his/her understanding and acceptance.      Dental advisory given  Plan Discussed with: CRNA and Surgeon  Anesthesia Plan Comments: (See PAT note written by Myra Gianotti, PA-C. DM1 (A1c 11), CAD, ESRD (on PD).  Plan routine monitors, GETA)      Anesthesia Quick Evaluation

## 2018-07-03 ENCOUNTER — Other Ambulatory Visit: Payer: Self-pay

## 2018-07-03 MED ORDER — POTASSIUM CHLORIDE CRYS ER 20 MEQ PO TBCR: 20.0000 meq | EXTENDED_RELEASE_TABLET | Freq: Every day | ORAL | 0 refills | Status: AC

## 2018-07-04 ENCOUNTER — Encounter: Payer: Self-pay | Admitting: Physician Assistant

## 2018-07-07 ENCOUNTER — Inpatient Hospital Stay (HOSPITAL_COMMUNITY): Payer: Medicare Other | Admitting: Vascular Surgery

## 2018-07-07 ENCOUNTER — Encounter (HOSPITAL_COMMUNITY)
Admission: RE | Disposition: A | Discharge status: Home / Self Care | Payer: Self-pay | Source: Home / Self Care | Attending: Vascular Surgery

## 2018-07-07 ENCOUNTER — Encounter (HOSPITAL_COMMUNITY): Payer: Self-pay | Admitting: *Deleted

## 2018-07-07 ENCOUNTER — Other Ambulatory Visit: Payer: Self-pay

## 2018-07-07 ENCOUNTER — Observation Stay (HOSPITAL_COMMUNITY)
Admission: RE | Admit: 2018-07-07 | Discharge: 2018-07-08 | Disposition: A | Discharge status: Home / Self Care | Payer: Medicare Other | Attending: Vascular Surgery | Admitting: Vascular Surgery

## 2018-07-07 DIAGNOSIS — I2510 Atherosclerotic heart disease of native coronary artery without angina pectoris: Secondary | ICD-10-CM | POA: Insufficient documentation

## 2018-07-07 DIAGNOSIS — I252 Old myocardial infarction: Secondary | ICD-10-CM | POA: Diagnosis not present

## 2018-07-07 DIAGNOSIS — Z794 Long term (current) use of insulin: Secondary | ICD-10-CM | POA: Diagnosis not present

## 2018-07-07 DIAGNOSIS — I429 Cardiomyopathy, unspecified: Secondary | ICD-10-CM | POA: Diagnosis not present

## 2018-07-07 DIAGNOSIS — I770 Arteriovenous fistula, acquired: Principal | ICD-10-CM | POA: Insufficient documentation

## 2018-07-07 DIAGNOSIS — E785 Hyperlipidemia, unspecified: Secondary | ICD-10-CM | POA: Insufficient documentation

## 2018-07-07 DIAGNOSIS — I120 Hypertensive chronic kidney disease with stage 5 chronic kidney disease or end stage renal disease: Secondary | ICD-10-CM | POA: Diagnosis not present

## 2018-07-07 DIAGNOSIS — Z79899 Other long term (current) drug therapy: Secondary | ICD-10-CM | POA: Insufficient documentation

## 2018-07-07 DIAGNOSIS — Z823 Family history of stroke: Secondary | ICD-10-CM | POA: Diagnosis not present

## 2018-07-07 DIAGNOSIS — E1051 Type 1 diabetes mellitus with diabetic peripheral angiopathy without gangrene: Secondary | ICD-10-CM | POA: Diagnosis not present

## 2018-07-07 DIAGNOSIS — Z7902 Long term (current) use of antithrombotics/antiplatelets: Secondary | ICD-10-CM | POA: Diagnosis not present

## 2018-07-07 DIAGNOSIS — Z940 Kidney transplant status: Secondary | ICD-10-CM | POA: Diagnosis not present

## 2018-07-07 DIAGNOSIS — Z951 Presence of aortocoronary bypass graft: Secondary | ICD-10-CM | POA: Insufficient documentation

## 2018-07-07 DIAGNOSIS — Z7982 Long term (current) use of aspirin: Secondary | ICD-10-CM | POA: Insufficient documentation

## 2018-07-07 DIAGNOSIS — N186 End stage renal disease: Secondary | ICD-10-CM | POA: Diagnosis not present

## 2018-07-07 DIAGNOSIS — K219 Gastro-esophageal reflux disease without esophagitis: Secondary | ICD-10-CM | POA: Insufficient documentation

## 2018-07-07 DIAGNOSIS — T827XXA Infection and inflammatory reaction due to other cardiac and vascular devices, implants and grafts, initial encounter: Secondary | ICD-10-CM | POA: Diagnosis present

## 2018-07-07 DIAGNOSIS — Z975 Presence of (intrauterine) contraceptive device: Secondary | ICD-10-CM | POA: Insufficient documentation

## 2018-07-07 DIAGNOSIS — E1022 Type 1 diabetes mellitus with diabetic chronic kidney disease: Secondary | ICD-10-CM | POA: Diagnosis not present

## 2018-07-07 DIAGNOSIS — S75001A Unspecified injury of femoral artery, right leg, initial encounter: Secondary | ICD-10-CM

## 2018-07-07 DIAGNOSIS — S75101A Unspecified injury of femoral vein at hip and thigh level, right leg, initial encounter: Secondary | ICD-10-CM

## 2018-07-07 HISTORY — PX: FEMORAL ARTERY EXPLORATION: SHX5160

## 2018-07-07 LAB — GLUCOSE, CAPILLARY
GLUCOSE-CAPILLARY: 68 mg/dL — AB (ref 70–99)
GLUCOSE-CAPILLARY: 63 mg/dL — AB (ref 70–99)
Glucose-Capillary: 137 mg/dL — ABNORMAL HIGH (ref 70–99)
Glucose-Capillary: 101 mg/dL — ABNORMAL HIGH (ref 70–99)
Glucose-Capillary: 63 mg/dL — ABNORMAL LOW (ref 70–99)
Glucose-Capillary: 117 mg/dL — ABNORMAL HIGH (ref 70–99)
Glucose-Capillary: 82 mg/dL (ref 70–99)
Glucose-Capillary: 174 mg/dL — ABNORMAL HIGH (ref 70–99)

## 2018-07-07 LAB — PROTIME-INR
INR: 1.2 (ref 0.8–1.2)
Prothrombin Time: 14.6 seconds (ref 11.4–15.2)

## 2018-07-07 LAB — CBC
HCT: 33.8 % — ABNORMAL LOW (ref 36.0–46.0)
Hemoglobin: 11.3 g/dL — ABNORMAL LOW (ref 12.0–15.0)
MCH: 26.9 pg (ref 26.0–34.0)
MCHC: 33.4 g/dL (ref 30.0–36.0)
MCV: 80.5 fL (ref 80.0–100.0)
Platelets: 343 10*3/uL (ref 150–400)
RBC: 4.2 MIL/uL (ref 3.87–5.11)
RDW: 13.8 % (ref 11.5–15.5)
WBC: 14.2 10*3/uL — ABNORMAL HIGH (ref 4.0–10.5)
nRBC: 0 % (ref 0.0–0.2)

## 2018-07-07 LAB — COMPREHENSIVE METABOLIC PANEL
ALK PHOS: 218 U/L — AB (ref 38–126)
ALT: 28 U/L (ref 0–44)
AST: 29 U/L (ref 15–41)
Albumin: 2 g/dL — ABNORMAL LOW (ref 3.5–5.0)
Anion gap: 16 — ABNORMAL HIGH (ref 5–15)
BILIRUBIN TOTAL: 0.8 mg/dL (ref 0.3–1.2)
BUN: 27 mg/dL — ABNORMAL HIGH (ref 6–20)
CO2: 21 mmol/L — AB (ref 22–32)
Calcium: 8.5 mg/dL — ABNORMAL LOW (ref 8.9–10.3)
Chloride: 98 mmol/L (ref 98–111)
Creatinine, Ser: 9.87 mg/dL — ABNORMAL HIGH (ref 0.44–1.00)
GFR calc Af Amer: 5 mL/min — ABNORMAL LOW (ref >60)
GFR calc non Af Amer: 4 mL/min — ABNORMAL LOW (ref >60)
GLUCOSE: 94 mg/dL (ref 70–99)
Potassium: 3.9 mmol/L (ref 3.5–5.1)
Sodium: 135 mmol/L (ref 135–145)
Total Protein: 7 g/dL (ref 6.5–8.1)

## 2018-07-07 LAB — POCT I-STAT 4, (NA,K, GLUC, HGB,HCT)
Glucose, Bld: 58 mg/dL — ABNORMAL LOW (ref 70–99)
HCT: 35 % — ABNORMAL LOW (ref 36.0–46.0)
HEMOGLOBIN: 11.9 g/dL — AB (ref 12.0–15.0)
Potassium: 3.1 mmol/L — ABNORMAL LOW (ref 3.5–5.1)
Sodium: 135 mmol/L (ref 135–145)

## 2018-07-07 LAB — HCG, SERUM, QUALITATIVE: Preg, Serum: NEGATIVE

## 2018-07-07 SURGERY — EXPLORATION, ARTERY, FEMORAL
Anesthesia: General | Laterality: Right

## 2018-07-07 MED ORDER — MEPERIDINE HCL 50 MG/ML IJ SOLN: 6.2500 mg | INTRAMUSCULAR | Status: DC | PRN

## 2018-07-07 MED ORDER — SCOPOLAMINE 1 MG/3DAYS TD PT72
1.0000 | MEDICATED_PATCH | Freq: Once | TRANSDERMAL | Status: DC
  Administered 2018-07-07: 1.5 mg via TRANSDERMAL
  Filled 2018-07-07: qty 1

## 2018-07-07 MED ORDER — PHENYLEPHRINE 40 MCG/ML (10ML) SYRINGE FOR IV PUSH (FOR BLOOD PRESSURE SUPPORT)
PREFILLED_SYRINGE | INTRAVENOUS | Status: DC | PRN
  Administered 2018-07-07: 40 ug via INTRAVENOUS
  Administered 2018-07-07 (×2): 80 ug via INTRAVENOUS

## 2018-07-07 MED ORDER — MIDAZOLAM HCL 5 MG/5ML IJ SOLN
INTRAMUSCULAR | Status: DC | PRN
  Administered 2018-07-07 (×2): 1 mg via INTRAVENOUS

## 2018-07-07 MED ORDER — PROPOFOL 10 MG/ML IV BOLUS
INTRAVENOUS | Status: DC | PRN
  Administered 2018-07-07: 80 mg via INTRAVENOUS

## 2018-07-07 MED ORDER — CHLORHEXIDINE GLUCONATE CLOTH 2 % EX PADS: 6.0000 | MEDICATED_PAD | Freq: Once | CUTANEOUS | Status: DC

## 2018-07-07 MED ORDER — ROCURONIUM BROMIDE 50 MG/5ML IV SOSY
PREFILLED_SYRINGE | INTRAVENOUS | Status: AC
  Filled 2018-07-07: qty 5

## 2018-07-07 MED ORDER — INSULIN LISPRO 100 UNIT/ML ~~LOC~~ SOLN: 1.0000 [IU] | Freq: Three times a day (TID) | SUBCUTANEOUS | Status: DC

## 2018-07-07 MED ORDER — SODIUM CHLORIDE 0.9 % IV SOLN: INTRAVENOUS | Status: DC

## 2018-07-07 MED ORDER — NEOSTIGMINE METHYLSULFATE 3 MG/3ML IV SOSY
PREFILLED_SYRINGE | INTRAVENOUS | Status: DC | PRN
  Administered 2018-07-07: 3 mg via INTRAVENOUS

## 2018-07-07 MED ORDER — GLYCOPYRROLATE PF 0.2 MG/ML IJ SOSY
PREFILLED_SYRINGE | INTRAMUSCULAR | Status: AC
  Filled 2018-07-07: qty 2

## 2018-07-07 MED ORDER — PHENYLEPHRINE 40 MCG/ML (10ML) SYRINGE FOR IV PUSH (FOR BLOOD PRESSURE SUPPORT)
PREFILLED_SYRINGE | INTRAVENOUS | Status: AC
  Filled 2018-07-07: qty 10

## 2018-07-07 MED ORDER — ATORVASTATIN CALCIUM 10 MG PO TABS
20.0000 mg | ORAL_TABLET | Freq: Every day | ORAL | Status: DC
  Administered 2018-07-07: 20 mg via ORAL
  Filled 2018-07-07: qty 2

## 2018-07-07 MED ORDER — ACETAMINOPHEN 325 MG PO TABS
325.0000 mg | ORAL_TABLET | ORAL | Status: DC | PRN
  Administered 2018-07-08: 650 mg via ORAL
  Filled 2018-07-07: qty 2

## 2018-07-07 MED ORDER — DELFLEX-LC/1.5% DEXTROSE 344 MOSM/L IP SOLN: INTRAPERITONEAL | Status: DC

## 2018-07-07 MED ORDER — LOPERAMIDE HCL 2 MG PO CAPS: 2.0000 mg | ORAL_CAPSULE | Freq: Three times a day (TID) | ORAL | Status: DC | PRN

## 2018-07-07 MED ORDER — SODIUM CHLORIDE 0.9 % IV SOLN
INTRAVENOUS | Status: DC | PRN
  Administered 2018-07-07: 07:00:00 via INTRAVENOUS

## 2018-07-07 MED ORDER — GUAIFENESIN-DM 100-10 MG/5ML PO SYRP: 15.0000 mL | ORAL_SOLUTION | ORAL | Status: DC | PRN

## 2018-07-07 MED ORDER — METOPROLOL TARTRATE 5 MG/5ML IV SOLN: 2.0000 mg | INTRAVENOUS | Status: DC | PRN

## 2018-07-07 MED ORDER — CARVEDILOL 6.25 MG PO TABS
6.2500 mg | ORAL_TABLET | Freq: Two times a day (BID) | ORAL | Status: DC
  Administered 2018-07-07: 6.25 mg via ORAL
  Filled 2018-07-07 (×2): qty 1

## 2018-07-07 MED ORDER — DEXTROSE 50 % IV SOLN
25.0000 mL | Freq: Once | INTRAVENOUS | Status: AC
  Administered 2018-07-07: 25 mL via INTRAVENOUS

## 2018-07-07 MED ORDER — PHENOL 1.4 % MT LIQD: 1.0000 | OROMUCOSAL | Status: DC | PRN

## 2018-07-07 MED ORDER — ACETAMINOPHEN 325 MG PO TABS: 650.0000 mg | ORAL_TABLET | ORAL | Status: DC | PRN

## 2018-07-07 MED ORDER — HYDROMORPHONE HCL 1 MG/ML IJ SOLN
INTRAMUSCULAR | Status: AC
  Administered 2018-07-07: 0.5 mg via INTRAVENOUS
  Filled 2018-07-07: qty 1

## 2018-07-07 MED ORDER — CALCIUM ACETATE (PHOS BINDER) 667 MG PO CAPS
2668.0000 mg | ORAL_CAPSULE | Freq: Three times a day (TID) | ORAL | Status: DC
  Administered 2018-07-07 – 2018-07-08 (×2): 2668 mg via ORAL
  Filled 2018-07-07 (×2): qty 4

## 2018-07-07 MED ORDER — ALUM & MAG HYDROXIDE-SIMETH 200-200-20 MG/5ML PO SUSP: 15.0000 mL | ORAL | Status: DC | PRN

## 2018-07-07 MED ORDER — GLYCOPYRROLATE PF 0.2 MG/ML IJ SOSY
PREFILLED_SYRINGE | INTRAMUSCULAR | Status: DC | PRN
  Administered 2018-07-07: 0.4 mg via INTRAVENOUS

## 2018-07-07 MED ORDER — HEPARIN SODIUM (PORCINE) 1000 UNIT/ML IJ SOLN
INTRAMUSCULAR | Status: DC | PRN
  Administered 2018-07-07: 5000 [IU] via INTRAVENOUS

## 2018-07-07 MED ORDER — PROPOFOL 10 MG/ML IV BOLUS
INTRAVENOUS | Status: AC
  Filled 2018-07-07: qty 20

## 2018-07-07 MED ORDER — PROTAMINE SULFATE 10 MG/ML IV SOLN
INTRAVENOUS | Status: AC
  Filled 2018-07-07: qty 5

## 2018-07-07 MED ORDER — HEPARIN SODIUM (PORCINE) 1000 UNIT/ML IJ SOLN
INTRAMUSCULAR | Status: AC
  Filled 2018-07-07: qty 1

## 2018-07-07 MED ORDER — INSULIN NPH (HUMAN) (ISOPHANE) 100 UNIT/ML ~~LOC~~ SUSP
20.0000 [IU] | Freq: Every day | SUBCUTANEOUS | Status: DC
  Administered 2018-07-07: 20 [IU] via SUBCUTANEOUS
  Filled 2018-07-07: qty 10

## 2018-07-07 MED ORDER — FENTANYL CITRATE (PF) 250 MCG/5ML IJ SOLN
INTRAMUSCULAR | Status: DC | PRN
  Administered 2018-07-07: 100 ug via INTRAVENOUS
  Administered 2018-07-07: 50 ug via INTRAVENOUS

## 2018-07-07 MED ORDER — CEFAZOLIN SODIUM-DEXTROSE 2-4 GM/100ML-% IV SOLN
2.0000 g | INTRAVENOUS | Status: AC
  Administered 2018-07-07: 2 g via INTRAVENOUS
  Filled 2018-07-07: qty 100

## 2018-07-07 MED ORDER — MIDAZOLAM HCL 2 MG/2ML IJ SOLN: 0.5000 mg | Freq: Once | INTRAMUSCULAR | Status: DC | PRN

## 2018-07-07 MED ORDER — LIDOCAINE 2% (20 MG/ML) 5 ML SYRINGE
INTRAMUSCULAR | Status: DC | PRN
  Administered 2018-07-07: 40 mg via INTRAVENOUS

## 2018-07-07 MED ORDER — GABAPENTIN 300 MG PO CAPS
300.0000 mg | ORAL_CAPSULE | Freq: Every day | ORAL | Status: DC
  Administered 2018-07-07: 300 mg via ORAL
  Filled 2018-07-07: qty 1

## 2018-07-07 MED ORDER — PROMETHAZINE HCL 25 MG/ML IJ SOLN: 6.2500 mg | INTRAMUSCULAR | Status: DC | PRN

## 2018-07-07 MED ORDER — CLOPIDOGREL BISULFATE 75 MG PO TABS
75.0000 mg | ORAL_TABLET | Freq: Every day | ORAL | Status: DC
  Administered 2018-07-08: 75 mg via ORAL
  Filled 2018-07-07: qty 1

## 2018-07-07 MED ORDER — PAPAVERINE HCL 30 MG/ML IJ SOLN
INTRAMUSCULAR | Status: AC
  Filled 2018-07-07: qty 2

## 2018-07-07 MED ORDER — CALCITRIOL 0.25 MCG PO CAPS
0.5000 ug | ORAL_CAPSULE | Freq: Every day | ORAL | Status: DC
  Administered 2018-07-08: 0.5 ug via ORAL
  Filled 2018-07-07: qty 2

## 2018-07-07 MED ORDER — MIDAZOLAM HCL 2 MG/2ML IJ SOLN
INTRAMUSCULAR | Status: AC
  Filled 2018-07-07: qty 2

## 2018-07-07 MED ORDER — SODIUM CHLORIDE 0.9 % IV SOLN
INTRAVENOUS | Status: DC | PRN
  Administered 2018-07-07: 30 ug/min via INTRAVENOUS

## 2018-07-07 MED ORDER — SODIUM CHLORIDE 0.9 % IV SOLN
INTRAVENOUS | Status: DC | PRN
  Administered 2018-07-07: 09:00:00

## 2018-07-07 MED ORDER — LABETALOL HCL 5 MG/ML IV SOLN: 10.0000 mg | INTRAVENOUS | Status: DC | PRN

## 2018-07-07 MED ORDER — DEXTROSE 50 % IV SOLN
12.5000 g | Freq: Once | INTRAVENOUS | Status: AC
  Administered 2018-07-07: 12.5 g via INTRAVENOUS

## 2018-07-07 MED ORDER — CISATRACURIUM BESYLATE 20 MG/10ML IV SOLN
INTRAVENOUS | Status: AC
  Filled 2018-07-07: qty 10

## 2018-07-07 MED ORDER — ONDANSETRON HCL 4 MG/2ML IJ SOLN: 4.0000 mg | Freq: Four times a day (QID) | INTRAMUSCULAR | Status: DC | PRN

## 2018-07-07 MED ORDER — PROTAMINE SULFATE 10 MG/ML IV SOLN
INTRAVENOUS | Status: DC | PRN
  Administered 2018-07-07 (×3): 10 mg via INTRAVENOUS

## 2018-07-07 MED ORDER — DEXTRAN 40 IN SALINE 10-0.9 % IV SOLN
INTRAVENOUS | Status: AC
  Filled 2018-07-07: qty 500

## 2018-07-07 MED ORDER — NEOSTIGMINE METHYLSULFATE 3 MG/3ML IV SOSY
PREFILLED_SYRINGE | INTRAVENOUS | Status: AC
  Filled 2018-07-07: qty 3

## 2018-07-07 MED ORDER — SODIUM CHLORIDE 0.9 % IV SOLN
INTRAVENOUS | Status: AC
  Filled 2018-07-07: qty 1.2

## 2018-07-07 MED ORDER — PANCRELIPASE (LIP-PROT-AMYL) 12000-38000 UNITS PO CPEP: 72000.0000 [IU] | ORAL_CAPSULE | ORAL | Status: DC

## 2018-07-07 MED ORDER — CISATRACURIUM BESYLATE (PF) 10 MG/5ML IV SOLN
INTRAVENOUS | Status: DC | PRN
  Administered 2018-07-07: 12 mg via INTRAVENOUS

## 2018-07-07 MED ORDER — ASPIRIN 325 MG PO TABS
325.0000 mg | ORAL_TABLET | Freq: Every day | ORAL | Status: DC
  Administered 2018-07-08: 325 mg via ORAL
  Filled 2018-07-07: qty 1

## 2018-07-07 MED ORDER — AMITRIPTYLINE HCL 10 MG PO TABS
10.0000 mg | ORAL_TABLET | Freq: Every evening | ORAL | Status: DC | PRN
  Administered 2018-07-07: 10 mg via ORAL
  Filled 2018-07-07 (×2): qty 1

## 2018-07-07 MED ORDER — PANTOPRAZOLE SODIUM 40 MG PO TBEC
40.0000 mg | DELAYED_RELEASE_TABLET | Freq: Every day | ORAL | Status: DC
  Filled 2018-07-07: qty 1

## 2018-07-07 MED ORDER — ACETAMINOPHEN 325 MG RE SUPP: 325.0000 mg | RECTAL | Status: DC | PRN

## 2018-07-07 MED ORDER — INSULIN ASPART 100 UNIT/ML ~~LOC~~ SOLN: 0.0000 [IU] | Freq: Three times a day (TID) | SUBCUTANEOUS | Status: DC

## 2018-07-07 MED ORDER — GABAPENTIN 100 MG PO CAPS: 100.0000 mg | ORAL_CAPSULE | Freq: Three times a day (TID) | ORAL | Status: DC

## 2018-07-07 MED ORDER — GENTAMICIN SULFATE 0.1 % EX CREA
1.0000 "application " | TOPICAL_CREAM | Freq: Every day | CUTANEOUS | Status: DC
  Administered 2018-07-08: 1 via TOPICAL
  Filled 2018-07-07: qty 15

## 2018-07-07 MED ORDER — HYDRALAZINE HCL 20 MG/ML IJ SOLN: 5.0000 mg | INTRAMUSCULAR | Status: DC | PRN

## 2018-07-07 MED ORDER — HYDROMORPHONE HCL 1 MG/ML IJ SOLN
0.2500 mg | INTRAMUSCULAR | Status: DC | PRN
  Administered 2018-07-07 (×2): 0.5 mg via INTRAVENOUS

## 2018-07-07 MED ORDER — POTASSIUM CHLORIDE CRYS ER 20 MEQ PO TBCR: 20.0000 meq | EXTENDED_RELEASE_TABLET | Freq: Once | ORAL | Status: DC

## 2018-07-07 MED ORDER — DEXAMETHASONE SODIUM PHOSPHATE 10 MG/ML IJ SOLN
INTRAMUSCULAR | Status: AC
  Filled 2018-07-07: qty 1

## 2018-07-07 MED ORDER — LEVONORGESTREL 20 MCG/24HR IU IUD: 1.0000 | INTRAUTERINE_SYSTEM | Freq: Once | INTRAUTERINE | Status: DC

## 2018-07-07 MED ORDER — PANCRELIPASE (LIP-PROT-AMYL) 12000-38000 UNITS PO CPEP: 108000.0000 [IU] | ORAL_CAPSULE | Freq: Three times a day (TID) | ORAL | Status: DC

## 2018-07-07 MED ORDER — ONDANSETRON HCL 4 MG/2ML IJ SOLN
INTRAMUSCULAR | Status: AC
  Filled 2018-07-07: qty 2

## 2018-07-07 MED ORDER — OXYCODONE-ACETAMINOPHEN 5-325 MG PO TABS
1.0000 | ORAL_TABLET | ORAL | Status: DC | PRN
  Administered 2018-07-07: 2 via ORAL
  Filled 2018-07-07: qty 2

## 2018-07-07 MED ORDER — LOSARTAN POTASSIUM 50 MG PO TABS
50.0000 mg | ORAL_TABLET | Freq: Every day | ORAL | Status: DC
  Filled 2018-07-07: qty 1

## 2018-07-07 MED ORDER — FENTANYL CITRATE (PF) 250 MCG/5ML IJ SOLN
INTRAMUSCULAR | Status: AC
  Filled 2018-07-07: qty 5

## 2018-07-07 MED ORDER — DEXTROSE 50 % IV SOLN
INTRAVENOUS | Status: DC | PRN
  Administered 2018-07-07: .25 via INTRAVENOUS

## 2018-07-07 MED ORDER — PANCRELIPASE (LIP-PROT-AMYL) 12000-38000 UNITS PO CPEP
108000.0000 [IU] | ORAL_CAPSULE | Freq: Three times a day (TID) | ORAL | Status: DC
  Administered 2018-07-07 – 2018-07-08 (×2): 108000 [IU] via ORAL
  Filled 2018-07-07 (×2): qty 9

## 2018-07-07 MED ORDER — 0.9 % SODIUM CHLORIDE (POUR BTL) OPTIME
TOPICAL | Status: DC | PRN
  Administered 2018-07-07 (×2): 1000 mL

## 2018-07-07 MED ORDER — DEXTROSE 50 % IV SOLN
INTRAVENOUS | Status: AC
  Administered 2018-07-07: 12.5 g via INTRAVENOUS
  Filled 2018-07-07: qty 50

## 2018-07-07 MED ORDER — LIDOCAINE 2% (20 MG/ML) 5 ML SYRINGE
INTRAMUSCULAR | Status: AC
  Filled 2018-07-07: qty 5

## 2018-07-07 MED ORDER — DEXAMETHASONE SODIUM PHOSPHATE 10 MG/ML IJ SOLN
INTRAMUSCULAR | Status: DC | PRN
  Administered 2018-07-07: 10 mg via INTRAVENOUS

## 2018-07-07 MED ORDER — POTASSIUM CHLORIDE CRYS ER 20 MEQ PO TBCR
20.0000 meq | EXTENDED_RELEASE_TABLET | Freq: Every day | ORAL | Status: DC
  Administered 2018-07-08: 20 meq via ORAL
  Filled 2018-07-07: qty 1

## 2018-07-07 MED ORDER — MORPHINE SULFATE (PF) 2 MG/ML IV SOLN: 2.0000 mg | INTRAVENOUS | Status: DC | PRN

## 2018-07-07 MED ORDER — ONDANSETRON HCL 4 MG/2ML IJ SOLN
INTRAMUSCULAR | Status: DC | PRN
  Administered 2018-07-07: 4 mg via INTRAVENOUS

## 2018-07-07 MED ORDER — DEXTROSE 50 % IV SOLN
INTRAVENOUS | Status: AC
  Administered 2018-07-07: 25 mL via INTRAVENOUS
  Filled 2018-07-07: qty 50

## 2018-07-07 MED ORDER — CLONIDINE HCL 0.1 MG PO TABS
0.1000 mg | ORAL_TABLET | Freq: Every day | ORAL | Status: DC
  Filled 2018-07-07: qty 1

## 2018-07-07 SURGICAL SUPPLY — 63 items
ADH SKN CLS APL DERMABOND .7 (GAUZE/BANDAGES/DRESSINGS) ×2
BANDAGE ACE 4X5 VEL STRL LF (GAUZE/BANDAGES/DRESSINGS) IMPLANT
BANDAGE ESMARK 6X9 LF (GAUZE/BANDAGES/DRESSINGS) IMPLANT
BNDG CMPR 9X6 STRL LF SNTH (GAUZE/BANDAGES/DRESSINGS)
BNDG ESMARK 6X9 LF (GAUZE/BANDAGES/DRESSINGS)
CANISTER SUCT 3000ML PPV (MISCELLANEOUS) ×3 IMPLANT
CANNULA VESSEL 3MM 2 BLNT TIP (CANNULA) IMPLANT
CANNULA VESSEL W/WING W/VALVE (CANNULA) ×3 IMPLANT
CLIP VESOCCLUDE MED 24/CT (CLIP) ×3 IMPLANT
CLIP VESOCCLUDE SM WIDE 24/CT (CLIP) ×3 IMPLANT
COVER WAND RF STERILE (DRAPES) ×3 IMPLANT
CUFF TOURNIQUET SINGLE 24IN (TOURNIQUET CUFF) IMPLANT
CUFF TOURNIQUET SINGLE 34IN LL (TOURNIQUET CUFF) IMPLANT
CUFF TOURNIQUET SINGLE 44IN (TOURNIQUET CUFF) IMPLANT
DERMABOND ADVANCED (GAUZE/BANDAGES/DRESSINGS) ×1
DERMABOND ADVANCED .7 DNX12 (GAUZE/BANDAGES/DRESSINGS) ×1 IMPLANT
DRAIN CHANNEL 15F RND FF W/TCR (WOUND CARE) IMPLANT
DRAIN PENROSE 3/4X12 (DRAIN) IMPLANT
DRAPE X-RAY CASS 24X20 (DRAPES) IMPLANT
ELECT REM PT RETURN 9FT ADLT (ELECTROSURGICAL) ×3
ELECTRODE REM PT RTRN 9FT ADLT (ELECTROSURGICAL) ×2 IMPLANT
EVACUATOR SILICONE 100CC (DRAIN) IMPLANT
GLOVE BIO SURGEON STRL SZ 6.5 (GLOVE) ×4 IMPLANT
GLOVE BIO SURGEON STRL SZ7.5 (GLOVE) ×5 IMPLANT
GLOVE BIOGEL PI IND STRL 6.5 (GLOVE) ×2 IMPLANT
GLOVE BIOGEL PI IND STRL 7.0 (GLOVE) ×1 IMPLANT
GLOVE BIOGEL PI IND STRL 8 (GLOVE) ×2 IMPLANT
GLOVE BIOGEL PI INDICATOR 6.5 (GLOVE) ×2
GLOVE BIOGEL PI INDICATOR 7.0 (GLOVE) ×1
GLOVE BIOGEL PI INDICATOR 8 (GLOVE) ×1
GLOVE SS BIOGEL STRL SZ 7 (GLOVE) ×1 IMPLANT
GLOVE SUPERSENSE BIOGEL SZ 7 (GLOVE) ×1
GOWN STRL REUS W/ TWL LRG LVL3 (GOWN DISPOSABLE) ×8 IMPLANT
GOWN STRL REUS W/ TWL XL LVL3 (GOWN DISPOSABLE) ×1 IMPLANT
GOWN STRL REUS W/TWL LRG LVL3 (GOWN DISPOSABLE) ×12
GOWN STRL REUS W/TWL XL LVL3 (GOWN DISPOSABLE) ×3
KIT BASIN OR (CUSTOM PROCEDURE TRAY) ×3 IMPLANT
KIT TURNOVER KIT B (KITS) ×3 IMPLANT
MARKER GRAFT CORONARY BYPASS (MISCELLANEOUS) IMPLANT
NS IRRIG 1000ML POUR BTL (IV SOLUTION) ×6 IMPLANT
PACK PERIPHERAL VASCULAR (CUSTOM PROCEDURE TRAY) ×3 IMPLANT
PAD ARMBOARD 7.5X6 YLW CONV (MISCELLANEOUS) ×6 IMPLANT
SET COLLECT BLD 21X3/4 12 (NEEDLE) IMPLANT
SPONGE INTESTINAL PEANUT (DISPOSABLE) ×2 IMPLANT
SPONGE SURGIFOAM ABS GEL 100 (HEMOSTASIS) IMPLANT
STAPLER VISISTAT (STAPLE) IMPLANT
STOPCOCK 4 WAY LG BORE MALE ST (IV SETS) IMPLANT
SUT ETHILON 3 0 PS 1 (SUTURE) IMPLANT
SUT PROLENE 5 0 C 1 24 (SUTURE) ×3 IMPLANT
SUT PROLENE 6 0 BV (SUTURE) ×9 IMPLANT
SUT PROLENE 7 0 BV 1 (SUTURE) IMPLANT
SUT SILK 2 0 PERMA HAND 18 BK (SUTURE) ×3 IMPLANT
SUT SILK 3 0 (SUTURE)
SUT SILK 3-0 18XBRD TIE 12 (SUTURE) IMPLANT
SUT VIC AB 2-0 CTB1 (SUTURE) ×6 IMPLANT
SUT VIC AB 3-0 SH 27 (SUTURE) ×6
SUT VIC AB 3-0 SH 27X BRD (SUTURE) ×4 IMPLANT
SUT VICRYL 4-0 PS2 18IN ABS (SUTURE) ×6 IMPLANT
TOWEL GREEN STERILE (TOWEL DISPOSABLE) ×3 IMPLANT
TRAY FOLEY MTR SLVR 16FR STAT (SET/KITS/TRAYS/PACK) ×3 IMPLANT
TUBING EXTENTION W/L.L. (IV SETS) IMPLANT
UNDERPAD 30X30 (UNDERPADS AND DIAPERS) ×3 IMPLANT
WATER STERILE IRR 1000ML POUR (IV SOLUTION) ×3 IMPLANT

## 2018-07-07 NOTE — Consult Note (Signed)
Reason for Consult: To manage dialysis and dialysis related needs Referring Physician: Dr. Betsy Coder [name] is an 48 y.o. female.  HPI: Pt is a 31F with a PMH of ESRD on HD, DM, h/o failed Ktx, CAD s/p CABG and MV Repair 2016 who is now seen in consultation at the request of Dr. Scot [name] for management of ESRD and provision of HD.    Pt had a repair of a R groin AV fistula today (which likely occurred with prior femoral access- cardiac cath, etc).  She is awake and alert after surgery and is feeling well.  The hope is that R foot wound will heal easier with repair of wound.  Pt says PD is going well.  No cloudy effluent or fibrin.  No constipation, f/c, n/v.    CCPD: EDW 54 kg 6 total exchanges- 5 on the cycler 2 L fill vol 1 hr 30 min dwell time and 1 pause of 1500 mL, 4 hrs, dry day.  Past Medical History:  Diagnosis Date  . Anemia   . CAD (coronary artery disease)   . Cardiomyopathy, ischemic 06/24/2014  . Diabetes mellitus without complication ([place])   . End stage renal disease ([place])   . Foot drop, left foot   . GERD (gastroesophageal reflux disease)   . H/O kidney transplant   . Hyperlipidemia   . Hypertension   . Myocardial infarction ([place])   . Pancreas transplanted ([place])   . Renal disorder   . 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 S/P mitral valve repair 07/18/14    Past Surgical History:  Procedure Laterality Date  . ABDOMINAL AORTOGRAM W/LOWER EXTREMITY N/A 09/19/2017   Procedure: ABDOMINAL AORTOGRAM W/LOWER EXTREMITY;  Surgeon: Angelia Mould, MD;  Location: [facility];  Service: Cardiovascular;  Laterality: N/A;  . CAPD INSERTION N/A 09/08/2013   Procedure: Laparoscopic CAPD peritoneal dialysis catheter placement, possible lysis of adhesions   ;  Surgeon: Adin Hector, MD;  Location: [facility];  Service: General;  Laterality: N/A;  . CARDIAC CATHETERIZATION     06/2014  . CESAREAN SECTION    . COMBINED KIDNEY-PANCREAS TRANSPLANT  05/2010   [facility]  Pancreas left mid abdomen  . CORONARY ARTERY BYPASS GRAFT N/A 07/18/2014   Procedure: CORONARY ARTERY BYPASS GRAFTING (CABG)X2 LIMA-LAD; SVG-PD;  Surgeon: Melrose Nakayama, MD;  Location: [facility];  Service: Open Heart Surgery;  Laterality: N/A;  . INSERTION OF DIALYSIS CATHETER  September 28, 2012   Right upper chest  . LAPAROSCOPIC INSERTION PERITONEAL CATHETER  07/18/2008   Dr Excell Seltzer  . LAPAROSCOPIC REPOSITIONING CAPD CATHETER  April 09, 202010   Dr Excell Seltzer  . LAPAROSCOPIC REPOSITIONING CAPD CATHETER  11/08/2008   Dr Excell Seltzer  . LAPAROSCOPIC REPOSITIONING CAPD CATHETER  10/03/2009   Unplugging CAPD catheter Dr Excell Seltzer  . LEFT HEART CATHETERIZATION WITH CORONARY ANGIOGRAM N/A 06/28/2014   Procedure: LEFT HEART CATHETERIZATION WITH CORONARY ANGIOGRAM;  Surgeon: [name], MD;  Location: [facility];  Service: Cardiovascular;  Laterality: N/A;  . MITRAL VALVE REPAIR N/A 07/18/2014   Procedure: MITRAL VALVE REPAIR (MVR);  Surgeon: Melrose Nakayama, MD;  Location: [facility];  Service: Open Heart Surgery;  Laterality: N/A;  . OMENTECTOMY  11/08/2008   Dr Excell Seltzer  . PERIPHERAL VASCULAR INTERVENTION Bilateral 09/19/2017   Procedure: PERIPHERAL VASCULAR INTERVENTION;  Surgeon: Angelia Mould, MD;  Location: [facility];  Service: Cardiovascular;  Laterality: Bilateral;  Iliac stents  . TEE WITHOUT CARDIOVERSION N/A 07/18/2014  Procedure: TRANSESOPHAGEAL ECHOCARDIOGRAM (TEE);  Surgeon: Melrose Nakayama, MD;  Location: [facility];  Service: Open Heart Surgery;  Laterality: N/A;    Family History  Problem Relation Age of Onset  . CAD Mother   . Stroke Mother   . COPD Father   . Colon cancer Neg Hx     Social History:  reports that she has never smoked. She has never used smokeless tobacco. She reports that she does not drink alcohol or use drugs.  Allergies:  Allergies  Allergen Reactions  . Pork-Derived Products Other (See Comments)    Pt is [place]    Medications:   Scheduled: . Chlorhexidine Gluconate Cloth  6 each Topical Once   And  . Chlorhexidine Gluconate Cloth  6 each Topical Once  . scopolamine  1 patch Transdermal Once     Results for orders placed or performed during the hospital encounter of 07/07/18 (from the past 48 hour(s))  I-STAT 4, (NA,K, GLUC, HGB,HCT)     Status: Abnormal   Collection Time: 07/07/18  6:14 AM  Result Value Ref Range   Sodium 135 135 - 145 mmol/L   Potassium 3.1 (L) 3.5 - 5.1 mmol/L   Glucose, Bld 58 (L) 70 - 99 mg/dL   HCT 35.0 (L) 36.0 - 46.0 %   Hemoglobin 11.9 (L) 12.0 - 15.0 g/dL  hCG, serum, qualitative     Status: None   Collection Time: 07/07/18  6:26 AM  Result Value Ref Range   Preg, Serum NEGATIVE NEGATIVE    Comment:        THE SENSITIVITY OF THIS METHODOLOGY IS >10 mIU/mL. Performed at [facility], [place] [address] [place], [place] [address]   Glucose, capillary     Status: Abnormal   Collection Time: 07/07/18  6:47 AM  Result Value Ref Range   Glucose-Capillary 137 (H) 70 - 99 mg/dL  Glucose, capillary     Status: Abnormal   Collection Time: 07/07/18  8:27 AM  Result Value Ref Range   Glucose-Capillary 101 (H) 70 - 99 mg/dL  Glucose, capillary     Status: Abnormal   Collection Time: 07/07/18  9:53 AM  Result Value Ref Range   Glucose-Capillary 68 (L) 70 - 99 mg/dL   Comment 1 Notify RN    Comment 2 Document in Chart   Glucose, capillary     Status: Abnormal   Collection Time: 07/07/18 11:27 AM  Result Value Ref Range   Glucose-Capillary 63 (L) 70 - 99 mg/dL  Glucose, capillary     Status: Abnormal   Collection Time: 07/07/18 11:28 AM  Result Value Ref Range   Glucose-Capillary 63 (L) 70 - 99 mg/dL  Glucose, capillary     Status: Abnormal   Collection Time: 07/07/18 12:36 PM  Result Value Ref Range   Glucose-Capillary 117 (H) 70 - 99 mg/dL    No results found.  ROS: all other systems reviewed and are negative except as per HPI Blood pressure 106/61, pulse (!) 46,  temperature (!) 97.2 F (36.2 C), resp. rate 12, height 5' (1.524 m), weight 53.5 kg, SpO2 94 %. .  GEN: lying flat in bed, NAD HEENT EOMI PERRL NECK No JVD PULM clear bilaterally CV RRR, soft systolic murmur ABD PD cath in LUQ with bandaid over it, abd nontender EXT no LE edema, RLE wound not examined today NEURO AAO x 3, a little sleepy but easily arousable and conversant  Assessment/Plan: 1 s/p repair of R groin AV  fistula- with Dr. Scot [name] 3/10.  Postop care per [facility] 2 ESRD: on PD- will provide PD tonight, likely wont' be able to do pause with logistics- d/w pt 3 Hypertension: Bps soft/ stable, will use all 1.5% tonight 4. Anemia of ESRD: Hgb 11.9, follow 5. Metabolic Bone Disease: renal diet/ binders 6.  Dispo: for home tomorrow after overnight stay  [facility] [name] 07/07/2018, 1:43 PM

## 2018-07-07 NOTE — Progress Notes (Signed)
   VASCULAR SURGERY ASSESSMENT & PLAN:   Doing well postop.  She does peritoneal dialysis.  I have notified nephrology of her admission.  She can probably be discharged tomorrow.  SUBJECTIVE:   No complaints.  PHYSICAL EXAM:   Vitals:   07/07/18 1405 07/07/18 1505 07/07/18 1535 07/07/18 1605  BP: 113/64 115/65 114/61 113/63  Pulse: 65 66 63 (!) 45  Resp: 19 19 13 16   Temp:      TempSrc:      SpO2: 99% 100% 100% 100%  Weight:      Height:       Right foot warm and well-perfused. Right groin incision looks fine.  LABS:   CBG (last 3)  Recent Labs    07/07/18 1128 07/07/18 1236 07/07/18 1604  GLUCAP 63* 117* 82    PROBLEM LIST:    Active Problems:   AVF (arteriovenous fistula) (HCC)   CURRENT MEDS:   . aspirin  325 mg Oral Daily  . atorvastatin  20 mg Oral Daily  . calcitRIOL  0.5 mcg Oral Daily  . calcium acetate  2,668 mg Oral TID WC  . carvedilol  6.25 mg Oral BID WC  . cloNIDine  0.1 mg Oral Daily  . [START ON 07/08/2018] clopidogrel  75 mg Oral Daily  . gabapentin  100 mg Oral TID  . gentamicin cream  1 application Topical Daily  . [START ON 07/08/2018] insulin aspart  0-9 Units Subcutaneous TID WC  . insulin NPH Human  20 Units Subcutaneous QHS  . lipase/protease/amylase  108,000 Units Oral TID WC  . lipase/protease/amylase  72,000 Units Oral With snacks  . losartan  50 mg Oral Daily  . pantoprazole  40 mg Oral Daily  . potassium chloride SA  20 mEq Oral Daily  . potassium chloride  20-40 mEq Oral Once    [name] Beeper: [phone] Office: [phone] 07/07/2018

## 2018-07-07 NOTE — Op Note (Signed)
    NAMELaurine Kuyper    MRN: [id number] DOB: 05-04-1970    DATE OF OPERATION: 07/07/2018  PREOP DIAGNOSIS:    Right groin AV fistula  POSTOP DIAGNOSIS:    Right superficial femoral artery to femoral vein AV fistula  PROCEDURE:    Repair of right groin AV fistula (repair of right superficial femoral artery and repair of right femoral vein)  SURGEON: Judeth Cornfield. Scot Dock, MD, FACS  ASSIST: Arlee Muslim, PA  ANESTHESIA: General  EBL: 100 cc  INDICATIONS:    [facility] [name] is a 48 y.o. female who had previous heart cath and other procedures in the right groin in the past.  She underwent an arteriogram for iliac angioplasty and an incidental finding was a fistula in the right groin.  She later presented with a wound on her foot and I felt that addressing the fistula was necessary to help facilitate healing of the wound.  FINDINGS:   There was a fairly significant communication between the right superficial femoral artery on the medial posterior wall and the femoral vein.  Both the artery and her veins were repaired.  TECHNIQUE:   The patient was taken to the operating room and received a general anesthetic.  The right groin was prepped and draped in usual sterile fashion.  Incision was made in the right groin and dissection carried down to the common femoral artery.  This was controlled with a vessel loop.  I then identified the deep femoral artery and superficial femoral artery.  There was an area on the medial wall of the superficial femoral artery that appeared to be stuck to the vein.  Once I had good control of the superficial femoral artery proximal and distal to this I slowly dissected this area out and identified the communication between the femoral vein and the posterior medial wall of the superficial femoral artery.  I then clamped the superficial femoral artery proximally distally and the hole in the artery was repaired with a 6-0 Prolene suture.  For  control of the vein pressure was held above and below with [place].  I could identify the hole and this was repaired with a 5-0 Prolene.  Clamps were then released.  I then fully explored this area and separated the arteries from the vein.  I I ran a another 5-0 Prolene over the area of the vein that was injured to secure this further.  There was good hemostasis.  An additional 6-0 Prolene was placed in the hole in the superficial femoral artery and there was excellent hemostasis.  Hemostasis was obtained in the wound.  The wound was then closed with a deep layer of 2-0 Vicryl, a subcutaneous layer with 3-0 Vicryl and the skin closed with 4-0 Vicryl.  Dermabond was applied.  Patient tolerated the procedure well and was transferred to the recovery room in stable condition.  All needle and sponge counts were correct.  [name], MD, FACS [facility]  DATE OF DICTATION:   07/07/2018

## 2018-07-07 NOTE — Interval H&P Note (Signed)
History and Physical Interval Note:  07/07/2018 7:22 AM  [facility] [name]  has presented today for surgery, with the diagnosis of common femoral arteriovenous fistula.  The various methods of treatment have been discussed with the patient and family. After consideration of risks, benefits and other options for treatment, the patient has consented to  Procedure(s): REPAIR RIGHT COMMON FEMORAL ARTERY TO FEMORAL VEIN FISTULA (Right) VEIN HARVEST GREATER SAPHENOUS (Left) as a surgical intervention.  The patient's history has been reviewed, patient examined, no change in status, stable for surgery.  I have reviewed the patient's chart and labs.  Questions were answered to the patient's satisfaction.     [name]

## 2018-07-07 NOTE — Progress Notes (Signed)
   Hypoglycemic Event  CBG: 58  Treatment: D50 25 mL (12.5 gm)  Symptoms: None  Follow-up CBG: [id number] CBG Result:137  Possible Reasons for Event: Medication regimen: pt took 1 unit of correction scale per instructions at home prior to arrival for home CBG 224, NPO for surgery  Comments/MD notified: Hypoglycemic protocol used, Dr. Glennon Mac made aware of treatment and Istat results. Updated CRNA    Melisse Caetano, Jaynie Bream

## 2018-07-07 NOTE — Anesthesia Procedure Notes (Addendum)
Procedure Name: Intubation Date/Time: 07/07/2018 7:57 AM Performed by: Harden Mo, CRNA Pre-anesthesia Checklist: Patient identified, Emergency Drugs available, Suction available and Patient being monitored Patient Re-evaluated:Patient Re-evaluated prior to induction Oxygen Delivery Method: Circle System Utilized Preoxygenation: Pre-oxygenation with 100% oxygen Induction Type: IV induction Ventilation: Mask ventilation without difficulty Laryngoscope Size: Glidescope and 3 Grade View: Grade I Tube type: Oral Tube size: 7.0 mm Number of attempts: 1 Airway Equipment and Method: Stylet and Oral airway Placement Confirmation: ETT inserted through vocal cords under direct vision,  positive ETCO2 and breath sounds checked- equal and bilateral Secured at: 22 cm Tube secured with: Tape Dental Injury: Teeth and Oropharynx as per pre-operative assessment  Comments: Elective glidescope intubation due to previous difficult airway note.

## 2018-07-07 NOTE — Transfer of Care (Signed)
Immediate Anesthesia Transfer of Care Note  Patient: [facility] [name]  Procedure(s) Performed: REPAIR RIGHT COMMON FEMORAL ARTERY TO FEMORAL VEIN FISTULA  WITH VEIN PATCH ANGIOPLASTY (Right )  Patient Location: PACU  Anesthesia Type:General  Level of Consciousness: awake, alert  and oriented  Airway & Oxygen Therapy: Patient Spontanous Breathing  Post-op Assessment: Report given to RN, Post -op Vital signs reviewed and stable and Patient moving all extremities X 4  Post vital signs: Reviewed and stable  Last Vitals:  Vitals Value Taken Time  BP 128/75 07/07/2018  9:51 AM  Temp    Pulse 60 07/07/2018  9:54 AM  Resp 14 07/07/2018  9:54 AM  SpO2 99 % 07/07/2018  9:54 AM  Vitals shown include unvalidated device data.  Last Pain:  Vitals:   07/07/18 0613  TempSrc:   PainSc: 0-No pain      Patients Stated Pain Goal: 3 (70/62/37 [id number])  Complications: No apparent anesthesia complications

## 2018-07-07 NOTE — Anesthesia Postprocedure Evaluation (Signed)
Anesthesia Post Note  Patient: Pr[facility]illa [name]  Procedure(s) Performed: REPAIR RIGHT COMMON FEMORAL ARTERY TO FEMORAL VEIN FISTULA  WITH VEIN PATCH ANGIOPLASTY (Right )     Patient location during evaluation: PACU Anesthesia Type: General Level of consciousness: awake and alert, patient cooperative and oriented Pain management: pain level controlled Vital Signs Assessment: post-procedure vital signs reviewed and stable Respiratory status: spontaneous breathing, nonlabored ventilation and respiratory function stable Cardiovascular status: blood pressure returned to baseline and stable Postop Assessment: no apparent nausea or vomiting Anesthetic complications: no    Last Vitals:  Vitals:   07/07/18 1120 07/07/18 1135  BP: 102/60 (!) 111/56  Pulse: (!) 58 70  Resp: 10 12  Temp:    SpO2: 95% 100%    Last Pain:  Vitals:   07/07/18 1045  TempSrc:   PainSc: 5                  Emeric Novinger,E. Sherline Eberwein

## 2018-07-08 ENCOUNTER — Encounter (HOSPITAL_COMMUNITY): Payer: Self-pay | Admitting: Vascular Surgery

## 2018-07-08 DIAGNOSIS — I770 Arteriovenous fistula, acquired: Secondary | ICD-10-CM | POA: Diagnosis not present

## 2018-07-08 LAB — GLUCOSE, CAPILLARY
Glucose-Capillary: 94 mg/dL (ref 70–99)
Glucose-Capillary: 86 mg/dL (ref 70–99)

## 2018-07-08 LAB — HIV ANTIBODY (ROUTINE TESTING W REFLEX): HIV Screen 4th Generation wRfx: NONREACTIVE

## 2018-07-08 MED ORDER — OXYCODONE-ACETAMINOPHEN 5-325 MG PO TABS: 1.0000 | ORAL_TABLET | Freq: Four times a day (QID) | ORAL | 0 refills | Status: DC | PRN

## 2018-07-08 NOTE — Procedures (Signed)
Patient seen and examined on peritoneal dialysis. BP 97/68 (BP Location: Left Arm)   Pulse 67   Temp 98 F (36.7 C) (Oral)   Resp 16   Ht 5' (1.524 m)   Wt 57.2 kg   SpO2 100%   BMI 24.63 kg/m    On dwell 4/5 all 1.5% tonight, no alarms, UF appropriate.   Clear fluid.  Tolerating treatment without complaints at this time.   Madelon Lips MD [facility] pgr [phone] 10:35 AM

## 2018-07-08 NOTE — Care Management CC44 (Signed)
Condition Code 68 Documentation Completed  Patient Details  Name: [facility] [name] MRN: [id number] Date of Birth: 13-May-1970   Condition Code 44 given:  Yes Patient signature on Condition Code 44 notice:  Yes Documentation of 2 MD's agreement:  Yes Code 44 added to claim:  Yes    Dawayne Patricia, RN 07/08/2018, 1:14 PM

## 2018-07-08 NOTE — Care Management Obs Status (Signed)
[place] NOTIFICATION   Patient Details  Name: [facility] [name] MRN: [id number] Date of Birth: [birth year]   Medicare Observation Status Notification Given:  Yes    Vinie Sill, [place] 07/08/2018, 1:13 PM

## 2018-07-08 NOTE — Discharge Instructions (Signed)
Incision Care, Adult °An incision is a cut that a doctor makes in your skin for surgery (for a procedure). Most times, these cuts are closed after surgery. Your cut from surgery may be closed with stitches (sutures), staples, skin glue, or skin tape (adhesive strips). You may need to return to your doctor to have stitches or staples taken out. This may happen many days or many weeks after your surgery. The cut needs to be well cared for so it does not get infected. °How to care for your cut °Cut care ° °· Follow instructions from your doctor about how to take care of your cut. Make sure you: °? Wash your hands with soap and water before you change your bandage (dressing). If you cannot use soap and water, use hand sanitizer. °? Change your bandage as told by your doctor. °? Leave stitches, skin glue, or skin tape in place. They may need to stay in place for 2 weeks or longer. If tape strips get loose and curl up, you may trim the loose edges. Do not remove tape strips completely unless your doctor says it is okay. °· Check your cut area every day for signs of infection. Check for: °? More redness, swelling, or pain. °? More fluid or blood. °? Warmth. °? Pus or a bad smell. °· Ask your doctor how to clean the cut. This may include: °? Using mild soap and water. °? Using a clean towel to pat the cut dry after you clean it. °? Putting a cream or ointment on the cut. Do this only as told by your doctor. °? Covering the cut with a clean bandage. °· Ask your doctor when you can leave the cut uncovered. °· Do not take baths, swim, or use a hot tub until your doctor says it is okay. Ask your doctor if you can take showers. You may only be allowed to take sponge baths for bathing. °Medicines °· If you were prescribed an antibiotic medicine, cream, or ointment, take the antibiotic or put it on the cut as told by your doctor. Do not stop taking or putting on the antibiotic even if your condition gets better. °· Take  over-the-counter and prescription medicines only as told by your doctor. °General instructions °· Limit movement around your cut. This helps healing. °? Avoid straining, lifting, or exercise for the first month, or for as long as told by your doctor. °? Follow instructions from your doctor about going back to your normal activities. °? Ask your doctor what activities are safe. °· Protect your cut from the sun when you are outside for the first 6 months, or for as long as told by your doctor. Put on sunscreen around the scar or cover up the scar. °· Keep all follow-up visits as told by your doctor. This is important. °Contact a doctor if: °· Your have more redness, swelling, or pain around the cut. °· You have more fluid or blood coming from the cut. °· Your cut feels warm to the touch. °· You have pus or a bad smell coming from the cut. °· You have a fever or shaking chills. °· You feel sick to your stomach (nauseous) or you throw up (vomit). °· You are dizzy. °· Your stitches or staples come undone. °Get help right away if: °· You have a red streak coming from your cut. °· Your cut bleeds through the bandage and the bleeding does not stop with gentle pressure. °· The edges of your cut   open up and separate. °· You have very bad (severe) pain. °· You have a rash. °· You are confused. °· You pass out (faint). °· You have trouble breathing and you have a fast heartbeat. °This information is not intended to replace advice given to you by your health care provider. Make sure you discuss any questions you have with your health care provider. °Document Released: 07/08/2011 Document Revised: 12/22/2015 Document Reviewed: 12/22/2015 °Elsevier Interactive Patient Education © 2019 [employer] ° °

## 2018-07-08 NOTE — Discharge Summary (Signed)
Physician Discharge Summary   Patient ID: Priscilla [name] [id number] 48 y.o. 06-05-1970  Admit date: 07/07/2018  Discharge date and time: 07/08/2018  2:22 PM   Admitting Physician: Angelia Mould, MD   Discharge Physician: Same  Admission Diagnoses: common femoral arteriovenous fistula  Discharge Diagnoses: Same  Admission Condition: fair  Discharged Condition: fair  Indication for Admission: Right groin arteriovenous fistula  Hospital Course: Priscilla [name] is a 48 year old female who was brought in as an outpatient for repair of right groin arteriovenous fistula by Dr. Scot Dock on 07/07/2018.  She tolerated this procedure well and was admitted to the hospital overnight.  Nephrology was consulted for peritoneal dialysis treatment overnight.  POD #1 right foot was well-perfused and warm and right groin incision was unremarkable.  The patient was tolerating a regular diet, and feeling ready for discharge home.  She will follow-up in office with Dr. Scot Dock in about 2 weeks.  She was prescribed 2 to 3 days of narcotic pain medication for continued postoperative pain control.  Discharge instructions were reviewed with the patient and she voices her understanding.  She will be discharged this morning in stable condition.  Consults: nephrology  Treatments: surgery: Repair of right groin arteriovenous fistula by Dr. Scot Dock on 07/07/2018  Discharge Exam: Denies rest pain right foot Vitals:   07/08/18 1219 07/08/18 1230  BP: 124/72 130/73  Pulse: 72 68  Resp: 18 16  Temp: 97.9 F (36.6 C) 97.7 F (36.5 C)  SpO2: 100% 100%   Lungs: Nonlabored Incisions: Right groin incision clean dry and intact Extremities: Right foot warm and well-perfused Abdomen: Soft Neurologic: Alert and oriented   Disposition: Discharge disposition: 01-Home or Self Care       Patient Instructions:  Allergies as of 07/08/2018      Reactions   Pork-derived Products Other (See  Comments)   Pt is [place]      Medication List    TAKE these medications   acetaminophen 325 MG tablet Commonly known as:  TYLENOL Take 650 mg by mouth every 4 (four) hours as needed for mild pain.   amitriptyline 10 MG tablet Commonly known as:  ELAVIL Take 10 mg by mouth at bedtime as needed for sleep.   aspirin 325 MG tablet Take 325 mg by mouth daily.   atorvastatin 20 MG tablet Commonly known as:  LIPITOR Take 20 mg by mouth daily.   calcitRIOL 0.5 MCG capsule Commonly known as:  ROCALTROL Take 0.5 mcg by mouth daily.   calcium acetate 667 MG capsule Commonly known as:  PHOSLO Take 2,668 mg by mouth 3 (three) times daily with meals.   carvedilol 6.25 MG tablet Commonly known as:  COREG TAKE 1/2 TABLET TWICE A DAY X 3 WEEKS THEN INCREASE TO 1 TABLET TWICE A DAY What changed:    how much to take  how to take this  when to take this  additional instructions   cloNIDine 0.1 MG tablet Commonly known as:  CATAPRES Take 0.1 mg by mouth daily.   clopidogrel 75 MG tablet Commonly known as:  PLAVIX TAKE 1 TABLET BY MOUTH DAILY   Creon 36000 UNITS Cpep capsule Generic drug:  lipase/protease/amylase Take 72,000-216,000 Units by mouth See admin instructions. 216,000 (3 caps) with meals, and 72,000 (2 caps) with snacks   gabapentin 100 MG capsule Commonly known as:  NEURONTIN Take 100 mg by mouth 3 (three) times daily.   HumuLIN N 100 UNIT/ML injection Generic drug:  insulin NPH Human Inject 20 Units  into the skin at bedtime.   insulin lispro 100 UNIT/ML injection Commonly known as:  HUMALOG Inject 1-10 Units into the skin 3 (three) times daily before meals. Per sliding scale   levonorgestrel 20 MCG/24HR IUD Commonly known as:  MIRENA 1 each by Intrauterine route once.   loperamide 2 MG capsule Commonly known as:  IMODIUM Take 2 mg by mouth every 8 (eight) hours as needed for diarrhea or loose stools.   losartan 100 MG tablet Commonly known as:   COZAAR Take 50 mg by mouth daily.   oxyCODONE-acetaminophen 5-325 MG tablet Commonly known as:  PERCOCET/ROXICET Take 1 tablet by mouth every 6 (six) hours as needed for moderate pain.   potassium chloride SA 20 MEQ tablet Commonly known as:  K-DUR,KLOR-CON Take 1 tablet (20 mEq total) by mouth daily.      Activity: activity as tolerated Diet: regular diet Wound Care: keep wound clean and dry  Follow-up with Dr. Scot Dock in 2 weeks.  SignedDagoberto Ligas 07/08/2018 5:19 PM

## 2018-07-08 NOTE — Progress Notes (Signed)
  [facility] Progress Note   Assessment/ Plan:    CCPD: EDW 54 kg 6 total exchanges- 5 on the cycler 2 L fill vol 1 hr 30 min dwell time and 1 pause of 1500 [name], Priscilla [name], Priscilla day.  Assessment/Plan: 1 s/p repair of R groin AV fistula- with Dr. Scot Dock 3/10.  Postop care per [facility] 2 ESRD: on PD- continue QHS 3 Hypertension: Bps soft/ stable, all 1.5% on PD Priscilla. 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 Metabolic Bone Disease: renal diet/ binders 6.  DM I: per primary 7.  Dispo: for home today after pt ambulates  Subjective:    Seen on PD.  Feeling well.  Post-op hgb stable.     Objective:   BP 97/68 (BP Location: Left Arm)   Pulse 67   Temp 98 F (36.7 C) (Oral)   Resp 16   Ht 5' (1.524 m)   Wt 57.2 kg   SpO2 100%   BMI 24.63 kg/m   Physical Exam: GEN: lying in bed, on PD dwell Priscilla/5. HEENT EOMI PERRL NECK No JVD PULM clear bilaterally CV RRR, soft systolic murmur ABD PD cath in LUQ with bandaid over it, abd nontender EXT no LE edema, R great toe with ~1 cm Priscilla ulcer at tip; R 2nd toe with 0.5 cm ulcer as well NEURO AAO x 3, sleeping and easily arousable  Labs: BMET Recent Labs  Lab 07/07/18 0614 07/07/18 1717  NA 135 135  K 3.1* 3.9  CL  --  98  CO2  --  21*  GLUCOSE 58* 94  BUN  --  27*  CREATININE  --  9.87*  CALCIUM  --  8.5*   CBC Recent Labs  Lab 07/07/18 0614 07/07/18 1717  WBC  --  14.2*  HGB 11.9* 11.3*  HCT 35.0* 33.8*  MCV  --  80.5  PLT  --  343    @IMGRELPRIORS @ Medications:    . aspirin  325 mg Oral Daily  . atorvastatin  20 mg Oral Daily  . calcitRIOL  0.5 mcg Oral Daily  . calcium acetate  2,668 mg Oral TID WC  . carvedilol  6.25 mg Oral BID WC  . cloNIDine  0.1 mg Oral Daily  . clopidogrel  75 mg Oral Daily  . gabapentin  300 mg Oral QHS  . gentamicin cream  1 application Topical Daily  . insulin aspart  0-9 Units Subcutaneous TID WC  . insulin NPH Human  20 Units Subcutaneous QHS  . lipase/protease/amylase   108,000 Units Oral TID WC  . lipase/protease/amylase  72,000 Units Oral With snacks  . losartan  50 mg Oral Daily  . pantoprazole  40 mg Oral Daily  . potassium chloride SA  20 mEq Oral Daily  . potassium chloride  20-40 mEq Oral Once     Madelon Lips, MD [place] pgr [phone] 07/08/2018, 10:30 AM

## 2018-07-10 ENCOUNTER — Telehealth: Payer: Self-pay | Admitting: Vascular Surgery

## 2018-07-10 NOTE — Telephone Encounter (Signed)
sch appt spk to pt mld ltr 07/29/2018 330pm p/o MD

## 2018-07-10 NOTE — Telephone Encounter (Signed)
-----   Message from Dagoberto Ligas, PA-C sent at 07/08/2018  5:21 PM EDT -----  Can you schedule an appt for this pt in 2 weeks with Dr. Scot Dock.  PO Repair R groin AV fistula. Thanks, [facility]

## 2018-07-12 ENCOUNTER — Encounter (HOSPITAL_COMMUNITY): Payer: Self-pay

## 2018-07-12 ENCOUNTER — Inpatient Hospital Stay (HOSPITAL_COMMUNITY): Payer: Medicare Other | Admitting: Anesthesiology

## 2018-07-12 ENCOUNTER — Other Ambulatory Visit: Payer: Self-pay

## 2018-07-12 ENCOUNTER — Encounter (HOSPITAL_COMMUNITY)
Admission: EM | Disposition: A | Discharge status: Home Health | Payer: Self-pay | Source: Home / Self Care | Attending: Internal Medicine

## 2018-07-12 ENCOUNTER — Inpatient Hospital Stay (HOSPITAL_COMMUNITY)
Admission: EM | Admit: 2018-07-12 | Discharge: 2018-07-24 | DRG: 356 | Disposition: A | Discharge status: Home Health | Payer: Medicare Other | Attending: Student in an Organized Health Care Education/Training Program | Admitting: Student in an Organized Health Care Education/Training Program

## 2018-07-12 DIAGNOSIS — E1051 Type 1 diabetes mellitus with diabetic peripheral angiopathy without gangrene: Secondary | ICD-10-CM | POA: Diagnosis present

## 2018-07-12 DIAGNOSIS — Z825 Family history of asthma and other chronic lower respiratory diseases: Secondary | ICD-10-CM

## 2018-07-12 DIAGNOSIS — Y830 Surgical operation with transplant of whole organ as the cause of abnormal reaction of the patient, or of later complication, without mention of misadventure at the time of the procedure: Secondary | ICD-10-CM | POA: Diagnosis present

## 2018-07-12 DIAGNOSIS — Z8774 Personal history of (corrected) congenital malformations of heart and circulatory system: Secondary | ICD-10-CM

## 2018-07-12 DIAGNOSIS — I255 Ischemic cardiomyopathy: Secondary | ICD-10-CM | POA: Diagnosis present

## 2018-07-12 DIAGNOSIS — Z940 Kidney transplant status: Secondary | ICD-10-CM

## 2018-07-12 DIAGNOSIS — Z9889 Other specified postprocedural states: Secondary | ICD-10-CM | POA: Diagnosis not present

## 2018-07-12 DIAGNOSIS — Z7982 Long term (current) use of aspirin: Secondary | ICD-10-CM

## 2018-07-12 DIAGNOSIS — I120 Hypertensive chronic kidney disease with stage 5 chronic kidney disease or end stage renal disease: Secondary | ICD-10-CM | POA: Diagnosis present

## 2018-07-12 DIAGNOSIS — K920 Hematemesis: Secondary | ICD-10-CM | POA: Diagnosis present

## 2018-07-12 DIAGNOSIS — Z79899 Other long term (current) drug therapy: Secondary | ICD-10-CM

## 2018-07-12 DIAGNOSIS — D62 Acute posthemorrhagic anemia: Secondary | ICD-10-CM | POA: Diagnosis present

## 2018-07-12 DIAGNOSIS — S71001A Unspecified open wound, right hip, initial encounter: Secondary | ICD-10-CM

## 2018-07-12 DIAGNOSIS — K8689 Other specified diseases of pancreas: Secondary | ICD-10-CM | POA: Diagnosis present

## 2018-07-12 DIAGNOSIS — R571 Hypovolemic shock: Secondary | ICD-10-CM | POA: Diagnosis not present

## 2018-07-12 DIAGNOSIS — K529 Noninfective gastroenteritis and colitis, unspecified: Secondary | ICD-10-CM | POA: Diagnosis present

## 2018-07-12 DIAGNOSIS — I2510 Atherosclerotic heart disease of native coronary artery without angina pectoris: Secondary | ICD-10-CM | POA: Diagnosis present

## 2018-07-12 DIAGNOSIS — R578 Other shock: Secondary | ICD-10-CM | POA: Diagnosis present

## 2018-07-12 DIAGNOSIS — E162 Hypoglycemia, unspecified: Secondary | ICD-10-CM | POA: Diagnosis not present

## 2018-07-12 DIAGNOSIS — E10649 Type 1 diabetes mellitus with hypoglycemia without coma: Secondary | ICD-10-CM | POA: Diagnosis present

## 2018-07-12 DIAGNOSIS — Z7902 Long term (current) use of antithrombotics/antiplatelets: Secondary | ICD-10-CM

## 2018-07-12 DIAGNOSIS — K219 Gastro-esophageal reflux disease without esophagitis: Secondary | ICD-10-CM | POA: Diagnosis present

## 2018-07-12 DIAGNOSIS — T8141XA Infection following a procedure, superficial incisional surgical site, initial encounter: Secondary | ICD-10-CM | POA: Diagnosis present

## 2018-07-12 DIAGNOSIS — E1065 Type 1 diabetes mellitus with hyperglycemia: Secondary | ICD-10-CM | POA: Diagnosis not present

## 2018-07-12 DIAGNOSIS — Z951 Presence of aortocoronary bypass graft: Secondary | ICD-10-CM | POA: Diagnosis not present

## 2018-07-12 DIAGNOSIS — E1022 Type 1 diabetes mellitus with diabetic chronic kidney disease: Secondary | ICD-10-CM | POA: Diagnosis present

## 2018-07-12 DIAGNOSIS — I9789 Other postprocedural complications and disorders of the circulatory system, not elsewhere classified: Secondary | ICD-10-CM | POA: Diagnosis not present

## 2018-07-12 DIAGNOSIS — E109 Type 1 diabetes mellitus without complications: Secondary | ICD-10-CM | POA: Diagnosis present

## 2018-07-12 DIAGNOSIS — Z955 Presence of coronary angioplasty implant and graft: Secondary | ICD-10-CM

## 2018-07-12 DIAGNOSIS — E875 Hyperkalemia: Secondary | ICD-10-CM | POA: Diagnosis not present

## 2018-07-12 DIAGNOSIS — I340 Nonrheumatic mitral (valve) insufficiency: Secondary | ICD-10-CM | POA: Diagnosis present

## 2018-07-12 DIAGNOSIS — Z91018 Allergy to other foods: Secondary | ICD-10-CM

## 2018-07-12 DIAGNOSIS — K254 Chronic or unspecified gastric ulcer with hemorrhage: Principal | ICD-10-CM | POA: Diagnosis present

## 2018-07-12 DIAGNOSIS — Z9483 Pancreas transplant status: Secondary | ICD-10-CM | POA: Diagnosis not present

## 2018-07-12 DIAGNOSIS — Z794 Long term (current) use of insulin: Secondary | ICD-10-CM

## 2018-07-12 DIAGNOSIS — Z833 Family history of diabetes mellitus: Secondary | ICD-10-CM

## 2018-07-12 DIAGNOSIS — Z992 Dependence on renal dialysis: Secondary | ICD-10-CM | POA: Diagnosis not present

## 2018-07-12 DIAGNOSIS — M21372 Foot drop, left foot: Secondary | ICD-10-CM | POA: Diagnosis present

## 2018-07-12 DIAGNOSIS — R197 Diarrhea, unspecified: Secondary | ICD-10-CM | POA: Diagnosis not present

## 2018-07-12 DIAGNOSIS — T8612 Kidney transplant failure: Secondary | ICD-10-CM | POA: Diagnosis present

## 2018-07-12 DIAGNOSIS — S31103A Unspecified open wound of abdominal wall, right lower quadrant without penetration into peritoneal cavity, initial encounter: Secondary | ICD-10-CM | POA: Diagnosis not present

## 2018-07-12 DIAGNOSIS — T827XXA Infection and inflammatory reaction due to other cardiac and vascular devices, implants and grafts, initial encounter: Secondary | ICD-10-CM | POA: Diagnosis not present

## 2018-07-12 DIAGNOSIS — S71101A Unspecified open wound, right thigh, initial encounter: Secondary | ICD-10-CM

## 2018-07-12 DIAGNOSIS — Z95820 Peripheral vascular angioplasty status with implants and grafts: Secondary | ICD-10-CM | POA: Diagnosis not present

## 2018-07-12 DIAGNOSIS — E785 Hyperlipidemia, unspecified: Secondary | ICD-10-CM | POA: Diagnosis present

## 2018-07-12 DIAGNOSIS — N186 End stage renal disease: Secondary | ICD-10-CM | POA: Diagnosis present

## 2018-07-12 DIAGNOSIS — E440 Moderate protein-calorie malnutrition: Secondary | ICD-10-CM | POA: Diagnosis present

## 2018-07-12 DIAGNOSIS — B961 Klebsiella pneumoniae [K. pneumoniae] as the cause of diseases classified elsewhere: Secondary | ICD-10-CM | POA: Diagnosis not present

## 2018-07-12 DIAGNOSIS — E876 Hypokalemia: Secondary | ICD-10-CM | POA: Diagnosis present

## 2018-07-12 DIAGNOSIS — E8770 Fluid overload, unspecified: Secondary | ICD-10-CM | POA: Diagnosis not present

## 2018-07-12 DIAGNOSIS — Z8249 Family history of ischemic heart disease and other diseases of the circulatory system: Secondary | ICD-10-CM

## 2018-07-12 DIAGNOSIS — Z823 Family history of stroke: Secondary | ICD-10-CM

## 2018-07-12 DIAGNOSIS — I252 Old myocardial infarction: Secondary | ICD-10-CM

## 2018-07-12 DIAGNOSIS — D631 Anemia in chronic kidney disease: Secondary | ICD-10-CM | POA: Diagnosis present

## 2018-07-12 DIAGNOSIS — K922 Gastrointestinal hemorrhage, unspecified: Secondary | ICD-10-CM | POA: Diagnosis not present

## 2018-07-12 DIAGNOSIS — Z79891 Long term (current) use of opiate analgesic: Secondary | ICD-10-CM

## 2018-07-12 DIAGNOSIS — X58XXXA Exposure to other specified factors, initial encounter: Secondary | ICD-10-CM | POA: Diagnosis not present

## 2018-07-12 HISTORY — PX: ESOPHAGOGASTRODUODENOSCOPY (EGD) WITH PROPOFOL: SHX5813

## 2018-07-12 LAB — PROTIME-INR
INR: 1.3 — ABNORMAL HIGH (ref 0.8–1.2)
Prothrombin Time: 16.1 seconds — ABNORMAL HIGH (ref 11.4–15.2)

## 2018-07-12 LAB — LIPASE, BLOOD: Lipase: 54 U/L — ABNORMAL HIGH (ref 11–51)

## 2018-07-12 LAB — HEMOGLOBIN AND HEMATOCRIT, BLOOD
HCT: 25.3 % — ABNORMAL LOW (ref 36.0–46.0)
Hemoglobin: 8 g/dL — ABNORMAL LOW (ref 12.0–15.0)

## 2018-07-12 LAB — I-STAT BETA HCG BLOOD, ED (MC, WL, AP ONLY): I-stat hCG, quantitative: 5.7 m[IU]/mL — ABNORMAL HIGH (ref <5)

## 2018-07-12 LAB — CBC WITH DIFFERENTIAL/PLATELET
Abs Immature Granulocytes: 0.09 10*3/uL — ABNORMAL HIGH (ref 0.00–0.07)
BASOS PCT: 0 %
Basophils Absolute: 0 10*3/uL (ref 0.0–0.1)
Eosinophils Absolute: 0.6 10*3/uL — ABNORMAL HIGH (ref 0.0–0.5)
Eosinophils Relative: 5 %
HCT: 32.8 % — ABNORMAL LOW (ref 36.0–46.0)
Hemoglobin: 10.3 g/dL — ABNORMAL LOW (ref 12.0–15.0)
Immature Granulocytes: 1 %
Lymphocytes Relative: 9 %
Lymphs Abs: 1.2 10*3/uL (ref 0.7–4.0)
MCH: 26.1 pg (ref 26.0–34.0)
MCHC: 31.4 g/dL (ref 30.0–36.0)
MCV: 83 fL (ref 80.0–100.0)
Monocytes Absolute: 0.8 10*3/uL (ref 0.1–1.0)
Monocytes Relative: 6 %
NRBC: 0 % (ref 0.0–0.2)
Neutro Abs: 9.7 10*3/uL — ABNORMAL HIGH (ref 1.7–7.7)
Neutrophils Relative %: 79 %
PLATELETS: 355 10*3/uL (ref 150–400)
RBC: 3.95 MIL/uL (ref 3.87–5.11)
RDW: 13.9 % (ref 11.5–15.5)
WBC: 12.4 10*3/uL — ABNORMAL HIGH (ref 4.0–10.5)

## 2018-07-12 LAB — COMPREHENSIVE METABOLIC PANEL
ALT: 5 U/L (ref 0–44)
AST: 22 U/L (ref 15–41)
Albumin: 1.7 g/dL — ABNORMAL LOW (ref 3.5–5.0)
Alkaline Phosphatase: 228 U/L — ABNORMAL HIGH (ref 38–126)
Anion gap: 13 (ref 5–15)
BUN: 32 mg/dL — ABNORMAL HIGH (ref 6–20)
CO2: 27 mmol/L (ref 22–32)
Calcium: 8.1 mg/dL — ABNORMAL LOW (ref 8.9–10.3)
Chloride: 94 mmol/L — ABNORMAL LOW (ref 98–111)
Creatinine, Ser: 9.76 mg/dL — ABNORMAL HIGH (ref 0.44–1.00)
GFR calc Af Amer: 5 mL/min — ABNORMAL LOW (ref >60)
GFR calc non Af Amer: 4 mL/min — ABNORMAL LOW (ref >60)
Glucose, Bld: 335 mg/dL — ABNORMAL HIGH (ref 70–99)
Potassium: 3 mmol/L — ABNORMAL LOW (ref 3.5–5.1)
Sodium: 134 mmol/L — ABNORMAL LOW (ref 135–145)
Total Bilirubin: 0.5 mg/dL (ref 0.3–1.2)
Total Protein: 5.9 g/dL — ABNORMAL LOW (ref 6.5–8.1)

## 2018-07-12 LAB — TYPE AND SCREEN
ABO/RH(D): O POS
ANTIBODY SCREEN: NEGATIVE

## 2018-07-12 LAB — CBC
HEMATOCRIT: 28 % — AB (ref 36.0–46.0)
Hemoglobin: 9.4 g/dL — ABNORMAL LOW (ref 12.0–15.0)
MCH: 27.9 pg (ref 26.0–34.0)
MCHC: 33.6 g/dL (ref 30.0–36.0)
MCV: 83.1 fL (ref 80.0–100.0)
PLATELETS: 257 10*3/uL (ref 150–400)
RBC: 3.37 MIL/uL — ABNORMAL LOW (ref 3.87–5.11)
RDW: 13.7 % (ref 11.5–15.5)
WBC: 19.8 10*3/uL — ABNORMAL HIGH (ref 4.0–10.5)
nRBC: 0 % (ref 0.0–0.2)

## 2018-07-12 LAB — APTT: aPTT: 26 seconds (ref 24–36)

## 2018-07-12 LAB — PREPARE RBC (CROSSMATCH)

## 2018-07-12 LAB — GLUCOSE, CAPILLARY: Glucose-Capillary: 278 mg/dL — ABNORMAL HIGH (ref 70–99)

## 2018-07-12 LAB — POC OCCULT BLOOD, ED: Fecal Occult Bld: POSITIVE — AB

## 2018-07-12 SURGERY — ESOPHAGOGASTRODUODENOSCOPY (EGD) WITH PROPOFOL
Anesthesia: General

## 2018-07-12 MED ORDER — SODIUM CHLORIDE 0.9 % IV SOLN
10.0000 mL/h | Freq: Once | INTRAVENOUS | Status: AC
  Administered 2018-07-12: 22:00:00 via INTRAVENOUS

## 2018-07-12 MED ORDER — ALBUMIN HUMAN 5 % IV SOLN
12.5000 g | Freq: Once | INTRAVENOUS | Status: AC
  Administered 2018-07-12 (×2): 12.5 g via INTRAVENOUS

## 2018-07-12 MED ORDER — ONDANSETRON HCL 4 MG/2ML IJ SOLN
INTRAMUSCULAR | Status: DC | PRN
  Administered 2018-07-12: 4 mg via INTRAVENOUS

## 2018-07-12 MED ORDER — SODIUM CHLORIDE 0.9 % IV SOLN: INTRAVENOUS | Status: DC

## 2018-07-12 MED ORDER — POTASSIUM CHLORIDE CRYS ER 20 MEQ PO TBCR: 20.0000 meq | EXTENDED_RELEASE_TABLET | Freq: Every day | ORAL | Status: DC

## 2018-07-12 MED ORDER — SODIUM CHLORIDE 0.9 % IV SOLN
INTRAVENOUS | Status: DC | PRN
  Administered 2018-07-12: 1000 mL via INTRAVENOUS
  Administered 2018-07-12: 500 mL via INTRAVENOUS
  Administered 2018-07-13: 1000 mL via INTRAVENOUS
  Administered 2018-07-18 (×2): 250 mL via INTRAVENOUS
  Administered 2018-07-21: 10 mL via INTRAVENOUS
  Administered 2018-07-22: 12:00:00 via INTRAVENOUS

## 2018-07-12 MED ORDER — DEXAMETHASONE SODIUM PHOSPHATE 10 MG/ML IJ SOLN
INTRAMUSCULAR | Status: DC | PRN
  Administered 2018-07-12: 4 mg via INTRAVENOUS

## 2018-07-12 MED ORDER — SODIUM CHLORIDE 0.9% IV SOLUTION
Freq: Once | INTRAVENOUS | Status: AC
  Administered 2018-07-12: via INTRAVENOUS

## 2018-07-12 MED ORDER — LIDOCAINE 2% (20 MG/ML) 5 ML SYRINGE
INTRAMUSCULAR | Status: DC | PRN
  Administered 2018-07-12: 60 mg via INTRAVENOUS

## 2018-07-12 MED ORDER — SODIUM CHLORIDE 0.9 % IV SOLN
INTRAVENOUS | Status: DC | PRN
  Administered 2018-07-12: 50 ug/min via INTRAVENOUS

## 2018-07-12 MED ORDER — CARVEDILOL 12.5 MG PO TABS: 6.2500 mg | ORAL_TABLET | Freq: Two times a day (BID) | ORAL | Status: DC

## 2018-07-12 MED ORDER — SODIUM CHLORIDE 0.9 % IV BOLUS
500.0000 mL | Freq: Once | INTRAVENOUS | Status: AC
  Administered 2018-07-12: 20:00:00 via INTRAVENOUS

## 2018-07-12 MED ORDER — FENTANYL CITRATE (PF) 100 MCG/2ML IJ SOLN
INTRAMUSCULAR | Status: AC
  Filled 2018-07-12: qty 2

## 2018-07-12 MED ORDER — SUCCINYLCHOLINE CHLORIDE 20 MG/ML IJ SOLN
INTRAMUSCULAR | Status: DC | PRN
  Administered 2018-07-12: 80 mg via INTRAVENOUS

## 2018-07-12 MED ORDER — ONDANSETRON HCL 4 MG/2ML IJ SOLN
4.0000 mg | Freq: Once | INTRAMUSCULAR | Status: AC
  Administered 2018-07-12: 4 mg via INTRAVENOUS
  Filled 2018-07-12: qty 2

## 2018-07-12 MED ORDER — PHENYLEPHRINE HCL 10 MG/ML IJ SOLN
INTRAMUSCULAR | Status: DC | PRN
  Administered 2018-07-12: 120 ug via INTRAVENOUS

## 2018-07-12 MED ORDER — CLONIDINE HCL 0.1 MG PO TABS: 0.1000 mg | ORAL_TABLET | Freq: Every day | ORAL | Status: DC

## 2018-07-12 MED ORDER — FENTANYL CITRATE (PF) 100 MCG/2ML IJ SOLN: 25.0000 ug | INTRAMUSCULAR | Status: DC | PRN

## 2018-07-12 MED ORDER — INSULIN ASPART 100 UNIT/ML ~~LOC~~ SOLN
2.0000 [IU] | SUBCUTANEOUS | Status: DC
  Administered 2018-07-12: 6 [IU] via SUBCUTANEOUS
  Administered 2018-07-13 – 2018-07-15 (×6): 4 [IU] via SUBCUTANEOUS

## 2018-07-12 MED ORDER — ALBUMIN HUMAN 5 % IV SOLN
INTRAVENOUS | Status: AC
  Administered 2018-07-12: 12.5 g via INTRAVENOUS
  Filled 2018-07-12: qty 250

## 2018-07-12 MED ORDER — SODIUM CHLORIDE 0.9 % IV SOLN
8.0000 mg/h | INTRAVENOUS | Status: DC
  Administered 2018-07-12 – 2018-07-15 (×7): 8 mg/h via INTRAVENOUS
  Filled 2018-07-12 (×9): qty 80

## 2018-07-12 MED ORDER — INSULIN ASPART 100 UNIT/ML ~~LOC~~ SOLN: 0.0000 [IU] | Freq: Three times a day (TID) | SUBCUTANEOUS | Status: DC

## 2018-07-12 MED ORDER — ONDANSETRON HCL 4 MG/2ML IJ SOLN: 4.0000 mg | Freq: Once | INTRAMUSCULAR | Status: DC | PRN

## 2018-07-12 MED ORDER — INSULIN ASPART 100 UNIT/ML ~~LOC~~ SOLN: 2.0000 [IU] | SUBCUTANEOUS | Status: DC

## 2018-07-12 MED ORDER — PROPOFOL 10 MG/ML IV BOLUS
INTRAVENOUS | Status: DC | PRN
  Administered 2018-07-12: 130 mg via INTRAVENOUS

## 2018-07-12 MED ORDER — CALCIUM ACETATE (PHOS BINDER) 667 MG PO CAPS: 2668.0000 mg | ORAL_CAPSULE | Freq: Three times a day (TID) | ORAL | Status: DC

## 2018-07-12 MED ORDER — CALCITRIOL 0.5 MCG PO CAPS
0.5000 ug | ORAL_CAPSULE | Freq: Every day | ORAL | Status: DC
  Filled 2018-07-12: qty 1

## 2018-07-12 MED ORDER — SODIUM CHLORIDE 0.9 % IV BOLUS
250.0000 mL | Freq: Once | INTRAVENOUS | Status: AC
  Administered 2018-07-12: 250 mL via INTRAVENOUS

## 2018-07-12 MED ORDER — INSULIN ASPART 100 UNIT/ML ~~LOC~~ SOLN: 0.0000 [IU] | Freq: Every day | SUBCUTANEOUS | Status: DC

## 2018-07-12 MED ORDER — SODIUM CHLORIDE 0.9 % IV SOLN
80.0000 mg | Freq: Once | INTRAVENOUS | Status: AC
  Administered 2018-07-12: 80 mg via INTRAVENOUS
  Filled 2018-07-12: qty 80

## 2018-07-12 SURGICAL SUPPLY — 15 items

## 2018-07-12 NOTE — ED Provider Notes (Signed)
[facility] EMERGENCY DEPARTMENT Provider Note   CSN: [id number] Arrival date & time: 07/12/18  1404    History   Chief Complaint Chief Complaint  Patient presents with   Emesis    vomiting blood    HPI Priscilla [name] is a 48 y.o. female.     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  Patient ports that this all began today.  Patient ports that she had 2 episodes of homogenously bloody emesis this morning.  She is experiencing no abdominal pain.  She reports that she also noted that she had dark-colored stools.  Patient reports that she has a history of pancreatic insufficiency and is currently on Creon which is improving her diarrhea.  Patient recently had a procedure on 07-17-2018 2 repair of right common femoral to femoral vein fistula with Dr. Scot Dock.  She has been on dual antiplatelet therapy with 325 mg of aspirin and Plavix 75 mg daily.  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   S/P mitral valve repair 07/18/14    Patient Active Problem List   Diagnosis Date Noted   AVF (arteriovenous fistula) ([place]) 07/07/2018   History of mitral valve annuloplasty #28 Memo ring 08/02/2016   Mixed hyperlipidemia 08/02/2016   Elevation of cardiac enzymes    Difficult intravenous access    DKA (diabetic ketoacidoses) ([place]) 05/14/2016   Microcytic anemia  05/14/2016   Hyponatremia 05/14/2016   Transaminitis 05/14/2016   CHF (congestive heart failure) ([place])    Essential hypertension 11/28/2014   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   Mitral regurgitation 06/29/2014   Abnormal EKG    Coronary artery disease involving native coronary artery of native heart without angina pectoris    Cardiomyopathy, ischemic 06/24/2014   Abnormal nuclear cardiac imaging test 06/24/2014   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   ESRD (end stage renal disease) ([place]) 09/24/2012   Diabetes mellitus type 1 ([place]) 09/24/2012   H/O pancreas transplant Jan2012 - Left mid abdomen 09/24/2012   Pancreatitis of pancreas transplant 09/24/2012    Past Surgical History:  Procedure Laterality Date   ABDOMINAL AORTOGRAM W/LOWER EXTREMITY N/A 09/19/2017   Procedure: ABDOMINAL AORTOGRAM W/LOWER EXTREMITY;  Surgeon: Angelia Mould, MD;  Location: [facility] CV LAB;  Service: Cardiovascular;  Laterality: N/A;   CAPD INSERTION N/A 09/08/2013   Procedure: Laparoscopic CAPD peritoneal dialysis catheter placement, possible lysis of adhesions   ;  Surgeon: Adin Hector, MD;  Location: [facility];  Service: General;  Laterality: N/A;   CARDIAC CATHETERIZATION     06/2014   CESAREAN SECTION     COMBINED KIDNEY-PANCREAS TRANSPLANT  05/2010   [facility]..  Pancreas left mid abdomen   CORONARY ARTERY BYPASS GRAFT N/A 07/18/2014   Procedure: CORONARY ARTERY BYPASS GRAFTING (CABG)X2 LIMA-LAD; SVG-PD;  Surgeon: [place] Nakayama, MD;  Location[facility]  OR;  Service: Open Heart Surgery;  Laterality: N/A;   FEMORAL ARTERY EXPLORATION Right 07/07/2018   Procedure: REPAIR RIGHT COMMON FEMORAL ARTERY TO FEMORAL VEIN FISTULA  WITH VEIN PATCH ANGIOPLASTY;  Surgeon: Angelia Mould, MD;  Location[facility] OR;  Service: Vascular;  Laterality: Right;   INSERTION OF DIALYSIS CATHETER  September 28, 2012   Right upper chest   LAPAROSCOPIC INSERTION PERITONEAL  CATHETER  07/18/2008   Dr Excell Seltzer   LAPAROSCOPIC REPOSITIONING CAPD CATHETER  2020/05/2308   Dr Excell Seltzer   LAPAROSCOPIC REPOSITIONING CAPD CATHETER  11/08/2008   Dr Excell Seltzer   LAPAROSCOPIC REPOSITIONING CAPD CATHETER  10/03/2009   Unplugging CAPD catheter Dr Excell Seltzer   LEFT HEART CATHETERIZATION WITH CORONARY ANGIOGRAM N/A 06/28/2014   Procedure: [place];  Surgeon: [name], MD;  Location: [facility] CATH LAB;  Service: Cardiovascular;  Laterality: N/A;   MITRAL VALVE REPAIR N/A 07/18/2014   Procedure: MITRAL VALVE REPAIR (MVR);  Surgeon: Melrose Nakayama, MD;  Location: [facility];  Service: Open Heart Surgery;  Laterality: N/A;   OMENTECTOMY  11/08/2008   Dr Excell Seltzer   PERIPHERAL VASCULAR INTERVENTION Bilateral 09/19/2017   Procedure: PERIPHERAL VASCULAR INTERVENTION;  Surgeon: Angelia Mould, MD;  Location: [facility] CV LAB;  Service: Cardiovascular;  Laterality: Bilateral;  Iliac stents   TEE WITHOUT CARDIOVERSION N/A 07/18/2014   Procedure: TRANSESOPHAGEAL ECHOCARDIOGRAM (TEE);  Surgeon: Melrose Nakayama, MD;  Location: [facility];  Service: Open Heart Surgery;  Laterality: N/A;     OB History   No obstetric history on file.      Home Medications    Prior to Admission medications   Medication Sig Start Date End Date Taking? Authorizing Provider  acetaminophen (TYLENOL) 325 MG tablet Take 650 mg by mouth every 4 (four) hours as needed for mild pain.    [provider]  amitriptyline (ELAVIL) 10 MG tablet Take 10 mg by mouth at bedtime as needed for sleep.  01/19/18   [provider]  aspirin 325 MG tablet Take 325 mg by mouth daily.  05/31/10   [provider]  atorvastatin (LIPITOR) 20 MG tablet Take 20 mg by mouth daily.    [provider]  calcitRIOL (ROCALTROL) 0.5 MCG capsule Take 0.5 mcg by mouth daily.    [provider]  calcium acetate (PHOSLO) 667 MG capsule Take 2,668 mg by mouth 3  (three) times daily with meals.     [provider]  carvedilol (COREG) 6.25 MG tablet TAKE 1/2 TABLET TWICE A DAY X 3 WEEKS THEN INCREASE TO 1 TABLET TWICE A DAY Patient taking differently: Take 6.25 mg by mouth 2 (two) times daily with a meal.  10/20/14   Croitoru, Mihai, MD  cloNIDine (CATAPRES) 0.1 MG tablet Take 0.1 mg by mouth daily.  06/10/18   [provider]  clopidogrel (PLAVIX) 75 MG tablet TAKE 1 TABLET BY MOUTH DAILY Patient taking differently: Take 75 mg by mouth daily.  05/15/18   Angelia Mould, MD  CREON 36000 units CPEP capsule Take 72,000-216,000 Units by mouth See admin instructions. 216,000 (3 caps) with meals, and 72,000 (2 caps) with snacks 06/16/18   [provider]  gabapentin (NEURONTIN) 100 MG capsule Take 100 mg by mouth 3 (three) times daily.    [provider]  HUMULIN N 100 UNIT/ML injection Inject 20 Units into the skin at bedtime.  05/20/18   [provider]  insulin lispro (HUMALOG) 100 UNIT/ML injection Inject 1-10  Units into the skin 3 (three) times daily before meals. Per sliding scale    [provider]  levonorgestrel (MIRENA) 20 MCG/24HR IUD 1 each by Intrauterine route once.    [provider]  loperamide (IMODIUM) 2 MG capsule Take 2 mg by mouth every 8 (eight) hours as needed for diarrhea or loose stools.    [provider]  losartan (COZAAR) 100 MG tablet Take 50 mg by mouth daily.     [provider]  oxyCODONE-acetaminophen (PERCOCET/ROXICET) 5-325 MG tablet Take 1 tablet by mouth every 6 (six) hours as needed for moderate pain. 07/08/18   Dagoberto Ligas, PA-C  potassium chloride SA (K-DUR,KLOR-CON) 20 MEQ tablet Take 1 tablet (20 mEq total) by mouth daily. 07/03/18 10/01/18  Almyra Deforest, PA    Family History Family History  Problem Relation Age of Onset   CAD Mother    Stroke Mother    COPD Father    Colon cancer Neg Hx     Social History Social History    Tobacco Use   Smoking status: Never Smoker   Smokeless tobacco: Never Used  Substance Use Topics   Alcohol use: No    Alcohol/week: 0.0 standard drinks   Drug use: No     Allergies   Pork-derived products   Review of Systems Review of Systems  Constitutional: Negative for chills and fever.  HENT: Negative for congestion and sore throat.   Eyes: Negative for visual disturbance.  Respiratory: Negative for chest tightness and shortness of breath.   Cardiovascular: Negative for chest pain.  Gastrointestinal: Positive for diarrhea and vomiting. Negative for abdominal pain, blood in stool and nausea.       +Dark stools  Genitourinary: Negative for dysuria and flank pain.  Musculoskeletal: Negative for back pain and myalgias.  Skin: Negative for rash.  Neurological: Negative for dizziness, syncope, light-headedness and headaches.     Physical Exam Updated Vital Signs BP (!) 153/96 (BP Location: Right Arm)    Pulse 70    Temp 97.7 F (36.5 C) (Oral)    Resp 17    Ht 5' (1.524 m)    Wt 53.5 kg    SpO2 100%    BMI 23.05 kg/m   Physical Exam Vitals signs and nursing note reviewed.  Constitutional:      General: She is not in acute distress.    Appearance: She is well-developed.  HENT:     Head: Normocephalic and atraumatic.  Eyes:     Conjunctiva/sclera: Conjunctivae normal.     Pupils: Pupils are equal, round, and reactive to light.  Neck:     Musculoskeletal: Normal range of motion and neck supple.  Cardiovascular:     Rate and Rhythm: Normal rate and regular rhythm.     Heart sounds: S1 normal and S2 normal. No murmur.  Pulmonary:     Effort: Pulmonary effort is normal.     Breath sounds: Normal breath sounds. No wheezing or rales.  Abdominal:     General: There is no distension.     Palpations: Abdomen is soft.     Tenderness: There is no abdominal tenderness. There is no guarding.  Genitourinary:    Comments: Rectal examination performed with RN chaperone  present.  Patient has dark and liquid stool present in the rectal vault. Musculoskeletal: Normal range of motion.        General: No deformity.  Lymphadenopathy:     Cervical: No cervical adenopathy.  Skin:    General:  Skin is warm and dry.     Findings: No erythema or rash.     Comments: Patient has well-healing surgical site from fistula repair in the right groin.  Neurological:     Mental Status: She is alert.     Comments: Cranial nerves grossly intact. Patient moves extremities symmetrically and with good coordination.  Psychiatric:        Behavior: Behavior normal.        Thought Content: Thought content normal.        Judgment: Judgment normal.      ED Treatments / Results  Labs (all labs ordered are listed, but only abnormal results are displayed) Labs Reviewed  POC OCCULT BLOOD, ED - Abnormal; Notable for the following components:      Result Value   Fecal Occult Bld POSITIVE (*)    All other components within normal limits  CBC WITH DIFFERENTIAL/PLATELET  COMPREHENSIVE METABOLIC PANEL  LIPASE, BLOOD  PROTIME-INR  APTT  POC OCCULT BLOOD, ED  I-STAT BETA HCG BLOOD, ED ([facility], [facility], [facility] ONLY)  TYPE AND SCREEN    EKG EKG Interpretation  Date/Time:  Sunday July 12 2018 14:25:26 EDT Ventricular Rate:  78 PR Interval:    QRS Duration: 143 QT Interval:  469 QTC Calculation: 528 R Axis:   -108 Text Interpretation:  Sinus rhythm Ventricular premature complex RBBB and LAFB No significant change since last tracing Confirmed by Gareth Morgan [phone]) on 07/12/2018 3:15:11 PM   Radiology No results found.  Procedures Procedures (including critical care time)  Medications Ordered in ED Medications  ondansetron (ZOFRAN) injection 4 mg (has no administration in time range)  pantoprazole (PROTONIX) 80 mg in sodium chloride 0.9 % 100 mL IVPB (has no administration in time range)  pantoprazole (PROTONIX) 80 mg in sodium chloride 0.9 % 250 mL (0.32 mg/mL) infusion (has  no administration in time range)     Initial Impression / Assessment and Plan / ED Course  I have reviewed the triage vital signs and the nursing notes.  Pertinent labs & imaging results that were available during my care of the patient were reviewed by me and considered in my medical decision making (see chart for details).  Clinical Course as of Jul 12 1802  Sun Jul 12, 2018  1629 Hemoglobin(!): 10.3 [AM]  52 Spoke with Dr. Paulita Fujita of gastroenterology who recommends patient be admitted to hospitalist service repeat H&H, and they will follow.  Appreciate his involvement in the care of this patient.   [AM]  [place] with Dr. Alois Cliche of Internal Medicine who will admit patient. Appreciate his involvement.    [AM]    Clinical Course User Index [AM] Albesa Seen, PA-C       Patient nontoxic-appearing, afebrile, and hemodynamically stable.  Patient presenting with 2 episodes of hematemesis and multiple dark stools today.  Patient has chronic diarrhea at baseline, but it does appear to be slightly increased today.  On exam, appears to be consistent with GI bleed.  Patient's hemoglobin is 10.3.  She will have H&H closely followed.  BUN chronically elevated due to patient's ESRD.  Patient has slight elevation in i-STAT hCG of 5.7, likely false positive.  Patient started on Protonix bolus and infusion.  Given the number of watery stools patient has had, will replace fluid very slowly given her ESRD history with 250 mL over 1 hour.  Will reassess hemodynamics at that time.  Patient be admitted to Internal Medicine per Dr. Erlinda Hong recommendation.  Appreciate  his involvement.  Final Clinical Impressions(s) / ED Diagnoses   Final diagnoses:  Gastrointestinal hemorrhage, unspecified gastrointestinal hemorrhage type    ED Discharge Orders    None       Tamala Julian 07/12/18 1805    Julianne Rice, MD 07/13/18 1635

## 2018-07-12 NOTE — ED Provider Notes (Signed)
Plan of Care note:   Providers were notified at 2000 that patient was hypotensive.  Blood pressure at that time 68/49.  Immediate ultrasound IV placed per Dr. Gareth Morgan, 16-gauge, and pages placed to critical care, GI, and internal medicine.  Emergent blood was released and obtained by technician.  After emergent blood, patient systolic blood pressure improved to 114, map of 70.  2016 Dr. Watt Climes of [facility] gastroenterology notified, and he arranged for patient to be emergently taken to the endoscopy suite for endoscopic management of GI bleed.  Appreciate his involvement.  Awaiting return page from [facility]. Patient to go to ICU after endoscopy.  9:01 PM Reattempt [place] page x3.   9:03 PM received call from [facility] critical care physician, who ensure that patient has an ICU bed awaiting her after endoscopy.  Appreciate her involvement in the care of this patient.  CRITICAL CARE Performed by: Albesa Seen   Total critical care time: 35 minutes  Critical care time was exclusive of separately billable procedures and treating other patients.  Critical care was necessary to treat or prevent imminent or life-threatening deterioration.  Critical care was time spent personally by me on the following activities: development of treatment plan with patient and/or surrogate as well as nursing, discussions with consultants, evaluation of patient's response to treatment, examination of patient, obtaining history from patient or surrogate, ordering and performing treatments and interventions, ordering and review of laboratory studies, ordering and review of radiographic studies, pulse oximetry and re-evaluation of patient's condition.     Tamala Julian 07/12/18 2124    Gareth Morgan, MD 07/13/18 [phone]

## 2018-07-12 NOTE — H&P (Signed)
[name], MRN:  [id number], DOB:  Apr 21, 1971, LOS: 0 ADMISSION DATE:  07/12/2018, CONSULTATION DATE:  07/12/2018 REFERRING MD:  [name], CHIEF COMPLAINT:  Hematemesis   Brief History   48 yo [name] with complicated PMHx of ESRD on PD, S/P CABG presents for coughing/vomiting blood.  History of present illness   48 yo [name] with a past history of ESRD on peritoneal dialysis despite kidney transplant, as well as pancreatic transplant, mitral repair and CABG x2; presented this evening to [facility] for vomiting blood. While convalescing from an AV fistula repair, she did have some nausea post-procedure and did vomit once without blood.  The patient abruptly developed frank bloody emesis for two episodes, both seemingly of high volume. In retrospect the patient may have some dark stool heralding the bleed. There was no associated abdominal pain. There was not diarrhea. The patient did not have syncope. She is has a tenuous right  femoral fistula or which she has been on both ASA 325mg   and Plavix 75mg  daily. GI was consulted in the ED and subsequently was taken to the endoscopy suite. EGD revealed large clot burden at the cardia. The patient was transfused a unit of PRBC and planned for re-scope tomorrow for active source and potential control of site.   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  Past Surgical History:  Procedure Laterality Date  . ABDOMINAL AORTOGRAM W/LOWER EXTREMITY N/A 09/19/2017   Procedure: ABDOMINAL AORTOGRAM W/LOWER EXTREMITY;  Surgeon: Angelia Mould, MD;  Location: [place] CV [facility];  Service: Cardiovascular;  Laterality: N/A;  . CAPD INSERTION N/A 09/08/2013   Procedure: Laparoscopic CAPD peritoneal dialysis catheter placement, possible lysis of adhesions   ;  Surgeon: Adin Hector, MD;  Location: [place];  Service: General;  Laterality: N/A;  . CARDIAC CATHETERIZATION     06/2014  . CESAREAN SECTION    . COMBINED KIDNEY-PANCREAS TRANSPLANT  05/2010   [facility]..  Pancreas left mid abdomen  . CORONARY ARTERY BYPASS GRAFT N/A 07/18/2014   Procedure: CORONARY ARTERY BYPASS GRAFTING (CABG)X2 LIMA-LAD; SVG-PD;  Surgeon: Melrose Nakayama, MD;  Location: [place];  Service: Open Heart Surgery;  Laterality: N/A;  . FEMORAL ARTERY EXPLORATION Right 07/07/2018   Procedure: REPAIR RIGHT COMMON FEMORAL ARTERY TO FEMORAL VEIN FISTULA  WITH VEIN PATCH ANGIOPLASTY;  Surgeon: Angelia Mould, MD;  Location: [place];  Service: Vascular;  Laterality: Right;  . INSERTION OF DIALYSIS CATHETER  September 28, 2012   Right upper chest  . LAPAROSCOPIC INSERTION PERITONEAL CATHETER  07/18/2008   Dr Excell Seltzer  . LAPAROSCOPIC REPOSITIONING CAPD CATHETER  Jul 11, 202010   Dr Excell Seltzer  . LAPAROSCOPIC REPOSITIONING CAPD CATHETER  11/08/2008   Dr Excell Seltzer  . LAPAROSCOPIC REPOSITIONING CAPD CATHETER  10/03/2009   Unplugging CAPD catheter Dr Excell Seltzer  . LEFT HEART CATHETERIZATION WITH CORONARY ANGIOGRAM N/A 06/28/2014   Procedure: LEFT HEART CATHETERIZATION WITH CORONARY ANGIOGRAM;  Surgeon: [name], MD;  Location: [place] CATH [facility];  Service: Cardiovascular;  Laterality: N/A;  . MITRAL VALVE REPAIR N/A 07/18/2014   Procedure: MITRAL VALVE REPAIR (MVR);  Surgeon: Melrose Nakayama, MD;  Location: [place];  Service: Open Heart Surgery;  Laterality: N/A;  . OMENTECTOMY  11/08/2008   Dr Excell Seltzer  . PERIPHERAL VASCULAR INTERVENTION Bilateral 09/19/2017   Procedure: PERIPHERAL VASCULAR INTERVENTION;  Surgeon: Angelia Mould, MD;  Location: [place] [facility];  Service: Cardiovascular;  Laterality: Bilateral;  Iliac stents  . TEE WITHOUT CARDIOVERSION N/A 07/18/2014   Procedure: TRANSESOPHAGEAL ECHOCARDIOGRAM (TEE);  Surgeon: Melrose Nakayama, MD;  Location: [place];  Service: Open Heart Surgery;  Laterality: N/A;    Review of Systems  Review of Systems  Constitutional: Negative for chills, fever, malaise/fatigue and weight loss.  HENT: Positive for nosebleeds.   Respiratory: Negative for cough, hemoptysis and sputum production.   Cardiovascular: Positive for chest pain. Negative for palpitations, orthopnea and leg swelling.  Gastrointestinal: Positive for abdominal pain, blood in stool, diarrhea, heartburn, melena, nausea and vomiting. Negative for constipation.  Genitourinary: Negative for dysuria.  Skin: Negative for itching and rash.  Neurological: Negative for tremors and headaches.    Significant Hospital Events   EGD 1u PRBC  Consults:  GI  Nephrology  Procedures:  EGD  Significant Diagnostic Tests:  EGD  Micro Data:    Antimicrobials:    Interim history/subjective:    Objective   Blood pressure 92/65, pulse 78, temperature 98.9 F (37.2 C), resp. rate (!) 23, height 5' (1.524 m), weight 53.5 kg, SpO2 100 %.        Intake/Output Summary (Last 24 hours) at 07/12/2018 2251 Last data filed at 07/12/2018 2220 Gross per 24 hour  Intake 2312.33 ml  Output -  Net 2312.33 ml   Filed Weights   07/12/18 1424  Weight: 53.5 kg    Physical Exam Constitutional:      General: She is not in acute distress.    Appearance: Normal appearance. She is not ill-appearing, toxic-appearing or diaphoretic.     Interventions: She is not intubated.    Comments: Thin african american female  HENT:     Head: Normocephalic and atraumatic.     Nose: Nose normal.     Mouth/Throat:     Mouth: Mucous membranes are moist.     Pharynx: Oropharynx is clear. No oropharyngeal exudate.  Eyes:     Extraocular Movements:  Extraocular movements intact.     Conjunctiva/sclera: Conjunctivae normal.     Pupils: Pupils are equal, round, and reactive to light.  Cardiovascular:     Rate and Rhythm: Normal rate. Occasional extrasystoles are present.    Pulses: Decreased pulses.     Heart sounds: Heart sounds not distant. Murmur present. Systolic murmur present with a grade of 1/6.  Pulmonary:     Effort: No tachypnea, accessory muscle usage or respiratory distress. She is not intubated.     Breath sounds: Normal breath sounds. No decreased air movement. No wheezing or rhonchi.  Abdominal:     General: Abdomen is flat. Bowel sounds are normal. There is no distension.     Palpations: Abdomen is soft.     Tenderness: There is abdominal tenderness in the epigastric area. There is no guarding or rebound.  Musculoskeletal:     Right lower leg: No edema.     Left lower leg: No edema.  Neurological:     Mental Status: She is alert and oriented to person, place, and time.     GCS: GCS eye subscore is 4. GCS verbal subscore is 5. GCS motor subscore is 6.     Cranial Nerves: Cranial nerves are intact.  Sensory: Sensation is intact.    Resolved Hospital Problem list     Assessment & Plan:  UGIB: The patient has been scoped and placed on continuous PPI drip with Protonix Apparently, the plan is to re-scope tomorrow for site reconnaissance.  Meanwhile, she was provided one unit of PRBCs, yet her blood pressure is soft Will use albumin 5% as needed for hypotension over crystalloids at this time While platelet quantity is sufficient, with her dual antiplatelet therapy, will transfuse 1 pack Remainder per GI consultant  ICM/CABG MVR Last echo 2 years ago. Given that she is hypotensive, will obtain new scan  ESRD/post kidney transplant: P/D as prescribed by nephrology consultant  DM/post pancrease transplant: CBGs by surveillance Glycemic Control Protocol    Best practice:  Diet: NPO Pain/Anxiety/Delirium  protocol (if indicated): -- VAP protocol (if indicated): -- DVT prophylaxis: SCD GI prophylaxis: PPI gtt Glucose control: CGP Mobility: as tolerated Code Status: FC Family Communication: discussed at bedside Disposition: ICU  Labs   CBC: Recent Labs  Lab 07/07/18 0614 07/07/18 1717 07/12/18 1558 07/12/18 1927  WBC  --  14.2* 12.4*  --   NEUTROABS  --   --  9.7*  --   HGB 11.9* 11.3* 10.3* 8.0*  HCT 35.0* 33.8* 32.8* 25.3*  MCV  --  80.5 83.0  --   PLT  --  343 355  --     Basic Metabolic Panel: Recent Labs  Lab 07/07/18 0614 07/07/18 1717 07/12/18 1558  NA 135 135 134*  K 3.1* 3.9 3.0*  CL  --  98 94*  CO2  --  21* 27  GLUCOSE 58* 94 335*  BUN  --  27* 32*  CREATININE  --  9.87* 9.76*  CALCIUM  --  8.5* 8.1*   GFR: Estimated Creatinine Clearance: 5.2 mL/min (A) (by C-G formula based on SCr of 9.76 mg/dL (H)). Recent Labs  Lab 07/07/18 1717 07/12/18 1558  WBC 14.2* 12.4*    Liver Function Tests: Recent Labs  Lab 07/07/18 1717 07/12/18 1558  AST 29 22  ALT 28 5  ALKPHOS 218* 228*  BILITOT 0.8 0.5  PROT 7.0 5.9*  ALBUMIN 2.0* 1.7*   Recent Labs  Lab 07/12/18 1558  LIPASE 54*   No results for input(s): AMMONIA in the last 168 hours.  ABG    Component Value Date/Time   PHART 7.269 (L) 07/19/2014 0247   PCO2ART 41.0 07/19/2014 0247   PO2ART 83.0 07/19/2014 0247   HCO3 20.1 05/14/2016 1537   HCO3 20.1 05/14/2016 1537   TCO2 30 09/19/2017 0924   ACIDBASEDEF 7.0 (H) 05/14/2016 1537   ACIDBASEDEF 7.0 (H) 05/14/2016 1537   O2SAT 36.0 05/14/2016 1537   O2SAT 36.0 05/14/2016 1537     Coagulation Profile: Recent Labs  Lab 07/07/18 1717 07/12/18 1558  INR 1.2 1.3*    Cardiac Enzymes: No results for input(s): CKTOTAL, CKMB, CKMBINDEX, TROPONINI in the last 168 hours.  HbA1C: Hgb A1c MFr Bld  Date/Time Value Ref Range Status  06/30/2018 03:20 PM 11.0 (H) 4.8 - 5.6 % Final    Comment:    (NOTE) Pre diabetes:          5.7%-6.4%  Diabetes:              >6.4% Glycemic control for   <7.0% adults with diabetes   05/14/2016 07:07 PM 15.0 (H) 4.8 - 5.6 % Final    Comment:    (NOTE)  Pre-diabetes: 5.7 - 6.4         Diabetes: >6.4         Glycemic control for adults with diabetes: <7.0     CBG: Recent Labs  Lab 07/07/18 1236 07/07/18 1604 07/07/18 2112 07/08/18 0615 07/08/18 1214  GLUCAP 117* 82 174* 94 86      Surgical History       Social History   reports that she has never smoked. She has never used smokeless tobacco. She reports that she does not drink alcohol or use drugs.   Family History   Her family history includes CAD in her mother; COPD in her father; Stroke in her mother. There is no history of Colon cancer.   Allergies Allergies  Allergen Reactions  . Pork-Derived Products Other (See Comments)    Pt is [place]     Home Medications  Prior to Admission medications   Medication Sig Start Date End Date Taking? Authorizing Provider  acetaminophen (TYLENOL) 325 MG tablet Take 650 mg by mouth every 4 (four) hours as needed for mild pain.   Yes [provider]  amitriptyline (ELAVIL) 10 MG tablet Take 10 mg by mouth at bedtime.  01/19/18  Yes [provider]  aspirin 325 MG tablet Take 325 mg by mouth at bedtime.  05/31/10  Yes [provider]  atorvastatin (LIPITOR) 10 MG tablet Take 10 mg by mouth at bedtime.    Yes [provider]  calcitRIOL (ROCALTROL) 0.5 MCG capsule Take 0.5 mcg by mouth at bedtime.    Yes [provider]  calcium acetate (PHOSLO) 667 MG capsule Take 2,001 mg by mouth See admin instructions. Take 3 capsules (2001 mg) by mouth three times daily with meals, take 2 capsules (1334 mg) with snacks   Yes [provider]  carvedilol (COREG) 6.25 MG tablet TAKE 1/2 TABLET TWICE A DAY X 3 WEEKS THEN INCREASE TO 1 TABLET TWICE A DAY Patient taking differently: Take 6.25 mg by mouth 2 (two) times daily with a meal.   10/20/14  Yes Croitoru, Mihai, MD  cloNIDine (CATAPRES) 0.1 MG tablet Take 0.1 mg by mouth at bedtime.  06/10/18  Yes [provider]  clopidogrel (PLAVIX) 75 MG tablet TAKE 1 TABLET BY MOUTH DAILY Patient taking differently: Take 75 mg by mouth at bedtime.  05/15/18  Yes Angelia Mould, MD  gabapentin (NEURONTIN) 100 MG capsule Take 100 mg by mouth 3 (three) times daily.   Yes [provider]  gentamicin cream (GARAMYCIN) 0.1 % Apply 1 application topically See admin instructions. Apply topically to dialysis site daily after showering   Yes [provider]  insulin lispro (HUMALOG) 100 UNIT/ML injection Inject 1-10 Units into the skin 3 (three) times daily before meals. Per sliding scale   Yes [provider]  insulin NPH Human (HUMULIN N,NOVOLIN N) 100 UNIT/ML injection Inject 20 Units into the skin at bedtime.   Yes [provider]  levonorgestrel (MIRENA) 20 MCG/24HR IUD 1 each by Intrauterine route once. Implanted March 2018   Yes [provider]  lipase/protease/amylase (CREON) 36000 UNITS CPEP capsule Take 72,000-108,000 Units by mouth See admin instructions. Take 3 capsules (108,000 units) by mouth three times daily with meals, take 2 capsules (72,000 units) with snacks   Yes [provider]  loperamide (IMODIUM) 2 MG capsule Take 2 mg by mouth every 8 (eight) hours as needed for diarrhea or loose stools.   Yes [provider]  losartan (COZAAR) 100  MG tablet Take 50 mg by mouth at bedtime.    Yes [provider]  multivitamin (RENA-VIT) TABS tablet Take 1 tablet by mouth at bedtime.   Yes [provider]  potassium chloride SA (K-DUR,KLOR-CON) 20 MEQ tablet Take 1 tablet (20 mEq total) by mouth daily. Patient taking differently: Take 20 mEq by mouth at bedtime.  07/03/18 10/01/18 Yes Meng, Isaac Laud, PA  Travoprost, BAK Free, (TRAVATAN) 0.004 % SOLN ophthalmic solution Place 1 drop into both eyes at bedtime.    Yes [provider]  oxyCODONE-acetaminophen (PERCOCET/ROXICET) 5-325 MG tablet Take 1 tablet by mouth every 6 (six) hours as needed for moderate pain. Patient not taking: Reported on 07/12/2018 07/08/18   Dagoberto Ligas, PA-C     Critical care time: 45 minutes

## 2018-07-12 NOTE — Op Note (Signed)
[facility] Patient Name: Priscilla [name] Procedure Date : 07/12/2018 MRN: [id number] Attending MD: Clarene Essex , MD Date of Birth: 1970/10/27 CSN: [id number] Age: 48 Admit Type: Inpatient Procedure:                Upper GI endoscopy Indications:              Hematemesis Providers:                Clarene Essex, MD, Baird Cancer, RN, Charolette Child,                            Technician, Ivar Drape CRNA, CRNA Referring MD:              Medicines:                General Anesthesia Complications:            No immediate complications. Estimated Blood Loss:     Estimated blood loss: none. Procedure:                Pre-Anesthesia Assessment:                           - Prior to the procedure, a History and Physical                            was performed, and patient medications and                            allergies were reviewed. The patient's tolerance of                            previous anesthesia was also reviewed. The risks                            and benefits of the procedure and the sedation                            options and risks were discussed with the patient.                            All questions were answered, and informed consent                            was obtained. Prior Anticoagulants: The patient has                            taken Plavix (clopidogrel), last dose was 1 day                            prior to procedure. ASA Grade Assessment: III - A                            patient with severe systemic disease. After  reviewing the risks and benefits, the patient was                            deemed in satisfactory condition to undergo the                            procedure.                           After obtaining informed consent, the endoscope was                            passed under direct vision. Throughout the                            procedure, the patient's blood pressure, pulse, and                            oxygen saturations were monitored continuously. The                            [id number]-[id number] ([id number]) Olympus gastroscope was                            introduced through the mouth, and advanced to the                            second part of duodenum. The upper GI endoscopy was                            somewhat difficult due to poor endoscopic                            visualization. Successful completion of the                            procedure was aided by lavage. The patient                            tolerated the procedure well. Scope In: Scope Out: Findings:      The examined esophagus was normal.      Red blood was found in the cardia, in the gastric fundus, in the gastric       body, on the lesser curvature of the stomach and in the gastric antrum.       Primarily just one large clot which could not be removed with lots of       washing and suctioning      The duodenal bulb, first portion of the duodenum and second portion of       the duodenum were normal.      The exam was otherwise without abnormality. Impression:               - Normal esophagus.                           -  Red blood and large clots in the cardia, in the                            gastric fundus, in the gastric body, in the lesser                            curvature of the stomach and in the gastric antrum.                            No obvious active bleeding seen with lots of                            washing and suctioning                           - Normal duodenal bulb, first portion of the                            duodenum and second portion of the duodenum.                           - The examination was otherwise normal.                           - No specimens collected. Recommendation:           - NPO today.                           - Continue present medications.                           - Telephone GI clinic if symptomatic PRN.                           -  Repeat upper endoscopy tomorrow for retreatment. Procedure Code(s):        --- Professional ---                           [phone], Esophagogastroduodenoscopy, flexible,                            transoral; diagnostic, including collection of                            specimen(s) by brushing or washing, when performed                            (separate procedure) Diagnosis Code(s):        --- Professional ---                           K92.2, Gastrointestinal hemorrhage, unspecified                           K92.0, Hematemesis CPT copyright 2018 [employer].  All rights reserved. The codes documented in this report are preliminary and upon coder review may  be revised to meet current compliance requirements. Clarene Essex, MD 07/12/2018 10:13:27 PM This report has been signed electronically. Number of Addenda: 0

## 2018-07-12 NOTE — ED Notes (Signed)
Carrie, endo here at bedside. Report given. Sister has patient belongings.

## 2018-07-12 NOTE — Anesthesia Preprocedure Evaluation (Addendum)
Anesthesia Evaluation  Patient identified by MRN, date of birth, ID band Patient awake    Reviewed: Allergy & Precautions, NPO status , Patient's Chart, lab work & pertinent test results, reviewed documented beta blocker date and time   Airway Mallampati: II  TM Distance: >3 FB Neck ROM: Full    Dental  (+) Dental Advisory Given, Missing   Pulmonary neg pulmonary ROS,    Pulmonary exam normal breath sounds clear to auscultation       Cardiovascular hypertension, Pt. on home beta blockers and Pt. on medications + CAD, + Past MI, + CABG and +CHF  Normal cardiovascular exam+ Valvular Problems/Murmurs (s/p MVR)  Rhythm:Regular Rate:Normal     Neuro/Psych negative neurological ROS     GI/Hepatic Neg liver ROS, GERD  ,GI Bleed   Endo/Other  diabetes, Insulin DependentS/p pancreas transplant   Renal/GU ESRF and DialysisRenal diseaseS/p kidney transplant-failed K+ 3.0     Musculoskeletal negative musculoskeletal ROS (+)   Abdominal   Peds  Hematology  (+) Blood dyscrasia, anemia ,   Anesthesia Other Findings Day of surgery medications reviewed with the patient.  Reproductive/Obstetrics                            Anesthesia Physical Anesthesia Plan  ASA: IV and emergent  Anesthesia Plan: General   Post-op Pain Management:    Induction: Intravenous, Rapid sequence and Cricoid pressure planned  PONV Risk Score and Plan: 3 and Dexamethasone and Ondansetron  Airway Management Planned: Oral ETT  Additional Equipment:   Intra-op Plan:   Post-operative Plan: Extubation in OR  Informed Consent: I have reviewed the patients History and Physical, chart, labs and discussed the procedure including the risks, benefits and alternatives for the proposed anesthesia with the patient or authorized representative who has indicated his/her understanding and acceptance.     Dental advisory  given  Plan Discussed with: CRNA  Anesthesia Plan Comments:         Anesthesia Quick Evaluation

## 2018-07-12 NOTE — H&P (Signed)
Date: 07/12/2018               Patient Name:  Priscilla [name] MRN: [id number]  DOB: 20-Dec-1970 Age / Sex: 48 y.o., female   PCP: [name], MD         Medical Service: Internal Medicine Teaching Service         Attending Physician: Dr. Rebeca Alert Raynaldo Opitz, MD    First Contact: Dr. Laural Golden  Pager: [phone]  Second Contact: Dr. 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  Patient stated that she was recovering following a recent AV fistula repair at home when she developed nausea on Friday.  That episode was associated with 1 event of nonbloody emesis.  She began to improve markedly until approximately 12 noon today when she became nauseous and vomited frank blood for the first time.  She denied fever, chills, abdominal pain prior to this, chest pain, palpitations, myalgias, joint aches, back pain, headache, or visual changes.  In addition she endorses a several day history of dark black stool and increased stool volume.  She is remained closely adherent to her home peritoneal dialysis and medication list.  In addition she doses associated symptoms of mid epigastric pain, belching, early satiety, and pain with large meals.  Meds:  No current facility-administered medications on file prior to encounter.    Current Outpatient Medications on File Prior to Encounter  Medication Sig Dispense Refill  . acetaminophen (TYLENOL) 325 MG tablet Take 650 mg by mouth every 4 (four) hours as needed for mild pain.    Marland Kitchen amitriptyline (ELAVIL) 10 MG tablet Take 10 mg by mouth at bedtime as needed for sleep.   3   . aspirin 325 MG tablet Take 325 mg by mouth daily.     Marland Kitchen atorvastatin (LIPITOR) 20 MG tablet Take 20 mg by mouth daily.    . calcitRIOL (ROCALTROL) 0.5 MCG capsule Take 0.5 mcg by mouth daily.    . calcium acetate (PHOSLO) 667 MG capsule Take 2,668 mg by mouth 3 (three) times daily with meals.     . carvedilol (COREG) 6.25 MG tablet TAKE 1/2 TABLET TWICE A DAY X 3 WEEKS THEN INCREASE TO 1 TABLET TWICE A DAY (Patient taking differently: Take 6.25 mg by mouth 2 (two) times daily with a meal. ) 60 tablet 6  . cloNIDine (CATAPRES) 0.1 MG tablet Take 0.1 mg by mouth daily.     . clopidogrel (PLAVIX) 75 MG tablet TAKE 1 TABLET BY MOUTH DAILY (Patient taking differently: Take 75 mg by mouth daily. ) 30 tablet 5  . CREON 36000 units CPEP capsule Take 72,000-216,000 Units by mouth See admin instructions. 216,000 (3 caps) with meals, and 72,000 (2 caps) with snacks    . gabapentin (NEURONTIN) 100 MG capsule Take 100 mg by mouth 3 (three) times daily.    Marland Kitchen HUMULIN N 100 UNIT/ML injection Inject 20 Units into the skin at bedtime.     . insulin lispro (HUMALOG) 100 UNIT/ML injection Inject 1-10 Units into the skin 3 (three) times daily before meals. Per sliding scale    .  levonorgestrel (MIRENA) 20 MCG/24HR IUD 1 each by Intrauterine route once.    . loperamide (IMODIUM) 2 MG capsule Take 2 mg by mouth every 8 (eight) hours as needed for diarrhea or loose stools.    Marland Kitchen losartan (COZAAR) 100 MG tablet Take 50 mg by mouth daily.     Marland Kitchen oxyCODONE-acetaminophen (PERCOCET/ROXICET) 5-325 MG tablet Take 1 tablet by mouth every 6 (six) hours as needed for moderate pain. 20 tablet 0  . potassium chloride SA (K-DUR,KLOR-CON) 20 MEQ tablet Take 1 tablet (20 mEq total) by mouth daily. 90 tablet 0   No outpatient medications have been marked as taking for the 07/12/18 encounter [facility] Encounter).    Allergies: Allergies as of 07/12/2018 - Review Complete 07/12/2018  Allergen Reaction Noted  . Pork-derived products  Other (See Comments) 09/25/2012   Past Medical History:  Diagnosis Date  . Anemia   . CAD (coronary artery disease)   . Cardiomyopathy, ischemic 06/24/2014  . Diabetes mellitus without complication ([place])   . End stage renal disease ([place])   . Foot drop, left foot   . GERD (gastroesophageal reflux disease)   . H/O kidney transplant   . Hyperlipidemia   . Hypertension   . Myocardial infarction ([place])   . Pancreas transplanted ([place])   . Renal disorder   . 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 S/P mitral valve repair 07/18/14   Family History:  Mother: Hypertension, diabetes, coronary artery disease Father: Hypertension, diabetes, coronary artery disease  Social History:  Patient denied ever being a smoker, denied EtOH use, denies illicit drug use Lives at home with her 2 sisters and son  Review of Systems: A complete ROS was negative except as per HPI.   Physical Exam: Blood pressure 112/70, pulse 80, temperature 97.7 F (36.5 C), temperature source Oral, resp. rate 13, height 5' (1.524 m), weight 53.5 kg, SpO2 100 %. General: A/O x4, in no acute distress, afebrile, nondiaphoretic HEENT: PEERL, EMO intact Cardio: RRR, GII systolic murmur Pulmonary: CTA bilaterally, no wheezing or crackles  Abdomen: Bowel sounds normal, soft, tender to palpation in the midepigastric region MSK: BLE nontender, nonedematous Neuro: Alert, CNII-XII grossly intact, conversational, strength 5/5 in the upper and lower extremities bilaterally, normal gait Psych: Appropriate affect, not depressed in appearance, engages well  EKG: personally reviewed my interpretation is RBBB, frequent PVC's, no prominent ST elevation or depression  CXR: personally reviewed my interpretation is n/a  Assessment & Plan by Problem: Active Problems:   ESRD (end stage renal disease) (HCC)   GI bleed  Assessment: Priscilla [name] is a 48 yo F w/ a PMHx, 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   Plan: GI Bleed: Acute onset hematemesis x 1 episode with associated dark stool. Hgb stable at 10.3 from 11.3 the prior day. GI requested medical admission. Likely PUD vs AVM. No history of cirrhosis as per patient. No Abd CT on record. Held ASA and plavix due to GI bleed -GI recommendations appreciated -Continue IV PPI overnight -Clear liquids overnight  -Trend Hgb overnight with repeat 8.0,  -CBC trend ordered to monitor hemoglobin  -NPO after midnight -Transfusion goal of 7  ESRD: On home peritoneal dialysis. Last completed the prior night. Nephrology has been consulted to assist with management and agreed to resume the following day. CMP relatively stable for a dialysis patient. Potassium 3.0, BUN 32. -Repleting Potassium  with home dose -Peritoneal dialysis likely tomorrow if she remains hospitalized -Continue phoslo and rocaltrol  DMI: SSI sensitive ordered. Held Humulin insulin as she is only on clears. Okay to restart if her CBG's increase.    Insomnia: Held amitriptyline due to GI irritation  Held Creon due to GI upset potential.   Diet: Clears, NPO after midnight Code: Full DVT PPX: SCD's Fluids: N/A Dispo: Admit patient to Inpatient with expected length of stay greater than 2 midnights.  Signed: Kathi Ludwig, MD 07/12/2018, 6:57 PM  Pager: Pager# [phone]

## 2018-07-12 NOTE — ED Triage Notes (Signed)
Pt arrives POV with son from home c/o n/v for Wednesday-Thursday of this week. Pt reports emesis to be "dark red" in color. Pt states she's vomitted twice. Pt hx includes ESRD, HTN, DM, PAD. Pt does peritoneal dialysis and had fistula repaired on Tuesday. A&o x4.

## 2018-07-12 NOTE — Consult Note (Signed)
Reason for Consult: GI bleed with ongoing melena and coffee-ground emesis earlier Referring Physician: ER physician and hospital team  Priscilla [name] is an 48 y.o. female.  HPI: Patient with a long complicated history but no previous GI bleeding and did have a colonoscopy a month ago at [facility] clinic for diarrhea and had a vascular procedure on Tuesday who this morning threw up some coffee-ground and red material and has had about 5 black stools since she has been here and is on aspirin and Plavix for her heart longstanding and has no other complaints and case discussed with multiple family members and her hospital computer chart reviewed  Past Medical History:  Diagnosis Date  . Anemia   . CAD (coronary artery disease)   . Cardiomyopathy, ischemic 06/24/2014  . Diabetes mellitus without complication ([place])   . End stage renal disease ([place])   . Foot drop, left foot   . GERD (gastroesophageal reflux disease)   . H/O kidney transplant   . Hyperlipidemia   . Hypertension   . Myocardial infarction ([place])   . Pancreas transplanted ([place])   . Renal disorder   . 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 S/P mitral valve repair 07/18/14    Past Surgical History:  Procedure Laterality Date  . ABDOMINAL AORTOGRAM W/LOWER EXTREMITY N/A 09/19/2017   Procedure: ABDOMINAL AORTOGRAM W/LOWER EXTREMITY;  Surgeon: Angelia Mould, MD;  Location: [place] [facility];  Service: Cardiovascular;  Laterality: N/A;  . CAPD INSERTION N/A 09/08/2013   Procedure: Laparoscopic CAPD peritoneal dialysis catheter placement, possible lysis of adhesions   ;  Surgeon: Adin Hector, MD;  Location: [place];  Service: General;  Laterality: N/A;  . CARDIAC CATHETERIZATION     06/2014  . CESAREAN SECTION    . COMBINED KIDNEY-PANCREAS TRANSPLANT  05/2010   [facility]..  Pancreas left mid abdomen  . CORONARY ARTERY BYPASS GRAFT N/A 07/18/2014   Procedure: CORONARY ARTERY BYPASS GRAFTING (CABG)X2 LIMA-LAD; SVG-PD;   Surgeon: Melrose Nakayama, MD;  Location: [place];  Service: Open Heart Surgery;  Laterality: N/A;  . FEMORAL ARTERY EXPLORATION Right 07/07/2018   Procedure: REPAIR RIGHT COMMON FEMORAL ARTERY TO FEMORAL VEIN FISTULA  WITH VEIN PATCH ANGIOPLASTY;  Surgeon: Angelia Mould, MD;  Location: [place];  Service: Vascular;  Laterality: Right;  . INSERTION OF DIALYSIS CATHETER  September 28, 2012   Right upper chest  . LAPAROSCOPIC INSERTION PERITONEAL CATHETER  07/18/2008   Dr Excell Seltzer  . LAPAROSCOPIC REPOSITIONING CAPD CATHETER  09/22/202010   Dr Excell Seltzer  . LAPAROSCOPIC REPOSITIONING CAPD CATHETER  11/08/2008   Dr Excell Seltzer  . LAPAROSCOPIC REPOSITIONING CAPD CATHETER  10/03/2009   Unplugging CAPD catheter Dr Excell Seltzer  . LEFT HEART CATHETERIZATION WITH CORONARY ANGIOGRAM N/A 06/28/2014   Procedure: LEFT HEART CATHETERIZATION WITH CORONARY ANGIOGRAM;  Surgeon: [name], MD;  Location: [place] C[facility];  Service: Cardiovascular;  Laterality: N/A;  . MITRAL VALVE REPAIR N/A 07/18/2014   Procedure: MITRAL VALVE REPAIR (MVR);  Surgeon: Melrose Nakayama, MD;  Location: [place];  Service: Open Heart Surgery;  Laterality: N/A;  . OMENTECTOMY  11/08/2008   Dr Excell Seltzer  . PERIPHERAL VASCULAR INTERVENTION Bilateral 09/19/2017   Procedure: PERIPHERAL VASCULAR INTERVENTION;  Surgeon: Angelia Mould, MD;  Location: [place] [facility];  Service: Cardiovascular;  Laterality: Bilateral;  Iliac stents  . TEE WITHOUT CARDIOVERSION N/A 07/18/2014   Procedure: TRANSESOPHAGEAL ECHOCARDIOGRAM (TEE);  Surgeon: Melrose Nakayama, MD;  Location: [place];  Service: Open  Heart Surgery;  Laterality: N/A;    Family History  Problem Relation Age of Onset  . CAD Mother   . Stroke Mother   . COPD Father   . Colon cancer Neg Hx     Social History:  reports that she has never smoked. She has never used smokeless tobacco. She reports that she does not drink alcohol or use drugs.  Allergies:  Allergies  Allergen Reactions   . Pork-Derived Products Other (See Comments)    Pt is [place]    Medications: I have reviewed the patient's current medications.  Results for orders placed or performed during the hospital encounter of 07/12/18 (from the past 48 hour(s))  POC occult blood, ED     Status: Abnormal   Collection Time: 07/12/18  3:23 PM  Result Value Ref Range   Fecal Occult Bld POSITIVE (A) NEGATIVE  CBC with Differential/Platelet     Status: Abnormal   Collection Time: 07/12/18  3:58 PM  Result Value Ref Range   WBC 12.4 (H) 4.0 - 10.5 K/uL   RBC 3.95 3.87 - 5.11 MIL/uL   Hemoglobin 10.3 (L) 12.0 - 15.0 g/dL   HCT 32.8 (L) 36.0 - 46.0 %   MCV 83.0 80.0 - 100.0 fL   MCH 26.1 26.0 - 34.0 pg   MCHC 31.4 30.0 - 36.0 g/dL   RDW 13.9 11.5 - 15.5 %   Platelets 355 150 - 400 K/uL   nRBC 0.0 0.0 - 0.2 %   Neutrophils Relative % 79 %   Neutro Abs 9.7 (H) 1.7 - 7.7 K/uL   Lymphocytes Relative 9 %   Lymphs Abs 1.2 0.7 - 4.0 K/uL   Monocytes Relative 6 %   Monocytes Absolute 0.8 0.1 - 1.0 K/uL   Eosinophils Relative 5 %   Eosinophils Absolute 0.6 (H) 0.0 - 0.5 K/uL   Basophils Relative 0 %   Basophils Absolute 0.0 0.0 - 0.1 K/uL   Immature Granulocytes 1 %   Abs Immature Granulocytes 0.09 (H) 0.00 - 0.07 K/uL    Comment: Performed at [facility], [address], [place], [place] [address]  Comprehensive metabolic panel     Status: Abnormal   Collection Time: 07/12/18  3:58 PM  Result Value Ref Range   Sodium 134 (L) 135 - 145 mmol/L   Potassium 3.0 (L) 3.5 - 5.1 mmol/L   Chloride 94 (L) 98 - 111 mmol/L   CO2 27 22 - 32 mmol/L   Glucose, Bld 335 (H) 70 - 99 mg/dL   BUN 32 (H) 6 - 20 mg/dL   Creatinine, Ser 9.76 (H) 0.44 - 1.00 mg/dL   Calcium 8.1 (L) 8.9 - 10.3 mg/dL   Total Protein 5.9 (L) 6.5 - 8.1 g/dL   Albumin 1.7 (L) 3.5 - 5.0 g/dL   AST 22 15 - 41 U/L   ALT 5 0 - 44 U/L   Alkaline Phosphatase 228 (H) 38 - 126 U/L   Total Bilirubin 0.5 0.3 - 1.2 mg/dL   GFR calc non Af Amer 4 (L)  >60 mL/min   GFR calc Af Amer 5 (L) >60 mL/min   Anion gap 13 5 - 15    Comment: Performed at [facility], [place] [address], [place], [place] [address]  Lipase, blood     Status: Abnormal   Collection Time: 07/12/18  3:58 PM  Result Value Ref Range   Lipase 54 (H) 11 - 51 U/L    Comment: Performed  at [facility], [place] [address] [place], [place] [address]  Type and screen     Status: None (Preliminary result)   Collection Time: 07/12/18  3:58 PM  Result Value Ref Range   ABO/RH(D) O POS    Antibody Screen NEG    Sample Expiration      07/15/2018 Performed at [facility], [place] [address], [place], [place] [address]    Unit Number [id number]    Blood Component Type RBC LR PHER2    Unit division 00    Status of Unit ISSUED    Transfusion Status OK TO TRANSFUSE    Crossmatch Result Compatible    Unit Number [id number]    Blood Component Type RBC LR PHER2    Unit division 00    Status of Unit ISSUED    Transfusion Status OK TO TRANSFUSE    Crossmatch Result Compatible    Unit Number [id number]    Blood Component Type RBC LR PHER2    Unit division 00    Status of Unit ALLOCATED    Transfusion Status OK TO TRANSFUSE    Crossmatch Result Compatible    Unit Number [id number]    Blood Component Type RBC LR PHER1    Unit division 00    Status of Unit ALLOCATED    Transfusion Status OK TO TRANSFUSE    Crossmatch Result Compatible   Protime-INR     Status: Abnormal   Collection Time: 07/12/18  3:58 PM  Result Value Ref Range   Prothrombin Time 16.1 (H) 11.4 - 15.2 seconds   INR 1.3 (H) 0.8 - 1.2    Comment: (NOTE) INR goal varies based on device and disease states. Performed at [facility], [place] [address], [place], [place] [address]   APTT     Status: None   Collection Time: 07/12/18  3:58 PM  Result Value Ref Range   aPTT 26 24 - 36 seconds    Comment: Performed at [place] [address] [place], [place] [address]  I-Stat  beta hCG blood, ED     Status: Abnormal   Collection Time: 07/12/18  4:12 PM  Result Value Ref Range   I-stat hCG, quantitative 5.7 (H) <5 mIU/mL   Comment 3            Comment:   GEST. AGE      CONC.  (mIU/mL)   <=1 WEEK        5 - 50     2 WEEKS       50 - 500     3 WEEKS       100 - 10,000     4 WEEKS     1,000 - 30,000        FEMALE AND NON-PREGNANT FEMALE:     LESS THAN 5 mIU/mL   Hemoglobin and hematocrit, blood     Status: Abnormal   Collection Time: 07/12/18  7:27 PM  Result Value Ref Range   Hemoglobin 8.0 (L) 12.0 - 15.0 g/dL    Comment: REPEATED TO VERIFY   HCT 25.3 (L) 36.0 - 46.0 %    Comment: Performed at [place] [address], [place], [place] [address]  Prepare RBC     Status: None   Collection Time: 07/12/18  8:07 PM  Result Value Ref Range   Order Confirmation      ORDER PROCESSED BY BLOOD BANK Performed at [facility], [place] [address], [place], [place] [address]     No results found.  ROS negative except above Blood pressure (!) 93/35, pulse 83, temperature 98.2 F (36.8 C), temperature source Oral, resp. rate 18, height 5' (1.524 m), weight 53.5 kg, SpO2 100 %. Physical Exam Vital signs stable afebrile no acute distress exam please see preassessment evaluation BUN and creatinine about the same hemoglobin dropped from 10-8 today and from 11 5 days ago INR 1.3 Assessment/Plan: Upper GI bleed in patient with multiple medical problems on aspirin and Plavix Plan: The risks benefits methods of endoscopy was discussed with the patient and her family and we compared it to the colonoscopy she had a month ago and will proceed this evening with further work-up and plans pending those findings  [place] E 07/12/2018, 9:25 PM

## 2018-07-12 NOTE — Transfer of Care (Signed)
Immediate Anesthesia Transfer of Care Note  Patient: Priscilla [name]  Procedure(s) Performed: ESOPHAGOGASTRODUODENOSCOPY (EGD) WITH PROPOFOL (N/A )  Patient Location: ICU  Anesthesia Type:General  Level of Consciousness: awake and alert   Airway & Oxygen Therapy: Patient Spontanous Breathing and Patient connected to face mask oxygen  Post-op Assessment: Report given to RN and Post -op Vital signs reviewed and stable  Post vital signs: Reviewed and stable  Last Vitals:  Vitals Value Taken Time  BP 98/85 07/12/2018 10:24 PM  Temp    Pulse 83 07/12/2018 10:27 PM  Resp 15 07/12/2018 10:27 PM  SpO2 100 % 07/12/2018 10:27 PM  Vitals shown include unvalidated device data.  Last Pain:  Vitals:   07/12/18 2223  TempSrc:   PainSc: (P) 0-No pain         Complications: No apparent anesthesia complications

## 2018-07-12 NOTE — Anesthesia Procedure Notes (Signed)
Procedure Name: Intubation Date/Time: 07/12/2018 9:41 PM Performed by: Suzy Bouchard, CRNA Pre-anesthesia Checklist: Patient identified, Emergency Drugs available, Suction available, Patient being monitored and Timeout performed Patient Re-evaluated:Patient Re-evaluated prior to induction Oxygen Delivery Method: Circle system utilized Preoxygenation: Pre-oxygenation with 100% oxygen Induction Type: IV induction, Rapid sequence and Cricoid Pressure applied Laryngoscope Size: Miller and 2 Grade View: Grade III Tube type: Oral Tube size: 7.0 mm Number of attempts: 1 Airway Equipment and Method: Stylet Placement Confirmation: positive ETCO2,  breath sounds checked- equal and bilateral and ETT inserted through vocal cords under direct vision Secured at: 23 cm Tube secured with: Tape Dental Injury: Teeth and Oropharynx as per pre-operative assessment

## 2018-07-13 ENCOUNTER — Inpatient Hospital Stay (HOSPITAL_COMMUNITY): Payer: Medicare Other | Admitting: Certified Registered Nurse Anesthetist

## 2018-07-13 ENCOUNTER — Encounter (HOSPITAL_COMMUNITY): Payer: Self-pay | Admitting: Gastroenterology

## 2018-07-13 ENCOUNTER — Encounter (HOSPITAL_COMMUNITY)
Admission: EM | Disposition: A | Discharge status: Home Health | Payer: Self-pay | Source: Home / Self Care | Attending: Internal Medicine

## 2018-07-13 DIAGNOSIS — R578 Other shock: Secondary | ICD-10-CM

## 2018-07-13 HISTORY — PX: SCLEROTHERAPY: SHX6841

## 2018-07-13 HISTORY — PX: HEMOSTASIS CLIP PLACEMENT: SHX6857

## 2018-07-13 HISTORY — PX: HOT HEMOSTASIS: SHX5433

## 2018-07-13 HISTORY — PX: ESOPHAGOGASTRODUODENOSCOPY (EGD) WITH PROPOFOL: SHX5813

## 2018-07-13 LAB — CBC
HCT: 27 % — ABNORMAL LOW (ref 36.0–46.0)
HEMATOCRIT: 19 % — AB (ref 36.0–46.0)
Hemoglobin: 6.5 g/dL — CL (ref 12.0–15.0)
Hemoglobin: 8.9 g/dL — ABNORMAL LOW (ref 12.0–15.0)
MCH: 28.1 pg (ref 26.0–34.0)
MCH: 29.2 pg (ref 26.0–34.0)
MCHC: 34.2 g/dL (ref 30.0–36.0)
MCHC: 33 g/dL (ref 30.0–36.0)
MCV: 82.3 fL (ref 80.0–100.0)
MCV: 88.5 fL (ref 80.0–100.0)
PLATELETS: 315 10*3/uL (ref 150–400)
Platelets: 274 10*3/uL (ref 150–400)
RBC: 2.31 MIL/uL — ABNORMAL LOW (ref 3.87–5.11)
RBC: 3.05 MIL/uL — ABNORMAL LOW (ref 3.87–5.11)
RDW: 13.8 % (ref 11.5–15.5)
RDW: 15.5 % (ref 11.5–15.5)
WBC: 16 10*3/uL — AB (ref 4.0–10.5)
WBC: 20.3 10*3/uL — ABNORMAL HIGH (ref 4.0–10.5)
nRBC: 0 % (ref 0.0–0.2)
nRBC: 0 % (ref 0.0–0.2)

## 2018-07-13 LAB — COMPREHENSIVE METABOLIC PANEL
ALT: 8 U/L (ref 0–44)
AST: 25 U/L (ref 15–41)
Albumin: 1.9 g/dL — ABNORMAL LOW (ref 3.5–5.0)
Alkaline Phosphatase: 123 U/L (ref 38–126)
Anion gap: 12 (ref 5–15)
BUN: 43 mg/dL — ABNORMAL HIGH (ref 6–20)
CO2: 21 mmol/L — ABNORMAL LOW (ref 22–32)
Calcium: 7.3 mg/dL — ABNORMAL LOW (ref 8.9–10.3)
Chloride: 105 mmol/L (ref 98–111)
Creatinine, Ser: 9.39 mg/dL — ABNORMAL HIGH (ref 0.44–1.00)
GFR calc Af Amer: 5 mL/min — ABNORMAL LOW (ref >60)
GFR calc non Af Amer: 5 mL/min — ABNORMAL LOW (ref >60)
Glucose, Bld: 136 mg/dL — ABNORMAL HIGH (ref 70–99)
POTASSIUM: 3.2 mmol/L — AB (ref 3.5–5.1)
Sodium: 138 mmol/L (ref 135–145)
TOTAL PROTEIN: 4.5 g/dL — AB (ref 6.5–8.1)
Total Bilirubin: 0.6 mg/dL (ref 0.3–1.2)

## 2018-07-13 LAB — GLUCOSE, CAPILLARY
Glucose-Capillary: 100 mg/dL — ABNORMAL HIGH (ref 70–99)
Glucose-Capillary: 100 mg/dL — ABNORMAL HIGH (ref 70–99)
Glucose-Capillary: 153 mg/dL — ABNORMAL HIGH (ref 70–99)
Glucose-Capillary: 170 mg/dL — ABNORMAL HIGH (ref 70–99)
Glucose-Capillary: 197 mg/dL — ABNORMAL HIGH (ref 70–99)
Glucose-Capillary: 236 mg/dL — ABNORMAL HIGH (ref 70–99)
Glucose-Capillary: 264 mg/dL — ABNORMAL HIGH (ref 70–99)
Glucose-Capillary: 274 mg/dL — ABNORMAL HIGH (ref 70–99)
Glucose-Capillary: 84 mg/dL (ref 70–99)

## 2018-07-13 LAB — MRSA PCR SCREENING: MRSA by PCR: NEGATIVE

## 2018-07-13 LAB — PREPARE RBC (CROSSMATCH)

## 2018-07-13 SURGERY — ESOPHAGOGASTRODUODENOSCOPY (EGD) WITH PROPOFOL
Anesthesia: Monitor Anesthesia Care

## 2018-07-13 MED ORDER — PROPOFOL 10 MG/ML IV BOLUS
INTRAVENOUS | Status: DC | PRN
  Administered 2018-07-13: 20 mg via INTRAVENOUS
  Administered 2018-07-13 (×2): 10 mg via INTRAVENOUS

## 2018-07-13 MED ORDER — INSULIN ASPART 100 UNIT/ML ~~LOC~~ SOLN
4.0000 [IU] | Freq: Once | SUBCUTANEOUS | Status: AC
  Administered 2018-07-13: 4 [IU] via SUBCUTANEOUS

## 2018-07-13 MED ORDER — ALBUMIN HUMAN 25 % IV SOLN
12.5000 g | Freq: Once | INTRAVENOUS | Status: AC
  Administered 2018-07-13: 12.5 g via INTRAVENOUS
  Filled 2018-07-13: qty 50

## 2018-07-13 MED ORDER — GENTAMICIN SULFATE 0.1 % EX CREA
1.0000 "application " | TOPICAL_CREAM | Freq: Every day | CUTANEOUS | Status: DC
  Administered 2018-07-13 – 2018-07-14 (×2): 1 via TOPICAL
  Filled 2018-07-13: qty 15

## 2018-07-13 MED ORDER — DELFLEX-LC/1.5% DEXTROSE 344 MOSM/L IP SOLN: INTRAPERITONEAL | Status: DC

## 2018-07-13 MED ORDER — ONDANSETRON HCL 4 MG/2ML IJ SOLN
INTRAMUSCULAR | Status: DC | PRN
  Administered 2018-07-13: 4 mg via INTRAVENOUS

## 2018-07-13 MED ORDER — GABAPENTIN 100 MG PO CAPS
100.0000 mg | ORAL_CAPSULE | Freq: Three times a day (TID) | ORAL | Status: DC
  Administered 2018-07-14 – 2018-07-24 (×31): 100 mg via ORAL
  Filled 2018-07-13 (×30): qty 1

## 2018-07-13 MED ORDER — PROMETHAZINE HCL 25 MG/ML IJ SOLN
12.5000 mg | Freq: Four times a day (QID) | INTRAMUSCULAR | Status: DC | PRN
  Administered 2018-07-13 – 2018-07-18 (×8): 12.5 mg via INTRAVENOUS
  Filled 2018-07-13 (×8): qty 1

## 2018-07-13 MED ORDER — PROPOFOL 500 MG/50ML IV EMUL
INTRAVENOUS | Status: DC | PRN
  Administered 2018-07-13: 75 ug/kg/min via INTRAVENOUS
  Administered 2018-07-13: 50 ug/kg/min via INTRAVENOUS

## 2018-07-13 MED ORDER — PHENYLEPHRINE HCL 10 MG/ML IJ SOLN
INTRAMUSCULAR | Status: DC | PRN
  Administered 2018-07-13 (×2): 80 ug via INTRAVENOUS
  Administered 2018-07-13: 120 ug via INTRAVENOUS
  Administered 2018-07-13: 80 ug via INTRAVENOUS
  Administered 2018-07-13 (×2): 120 ug via INTRAVENOUS
  Administered 2018-07-13: 80 ug via INTRAVENOUS

## 2018-07-13 MED ORDER — LEVONORGESTREL 20 MCG/24HR IU IUD: 1.0000 | INTRAUTERINE_SYSTEM | Freq: Once | INTRAUTERINE | Status: DC

## 2018-07-13 MED ORDER — INSULIN NPH (HUMAN) (ISOPHANE) 100 UNIT/ML ~~LOC~~ SUSP
20.0000 [IU] | Freq: Every day | SUBCUTANEOUS | Status: DC
  Administered 2018-07-13 – 2018-07-15 (×3): 20 [IU] via SUBCUTANEOUS
  Filled 2018-07-13: qty 10

## 2018-07-13 MED ORDER — POTASSIUM CHLORIDE 10 MEQ/100ML IV SOLN
10.0000 meq | INTRAVENOUS | Status: AC
  Administered 2018-07-13: 10 meq via INTRAVENOUS

## 2018-07-13 MED ORDER — ALBUMIN HUMAN 5 % IV SOLN
INTRAVENOUS | Status: DC | PRN
  Administered 2018-07-13: 15:00:00 via INTRAVENOUS

## 2018-07-13 MED ORDER — LOSARTAN POTASSIUM 50 MG PO TABS
50.0000 mg | ORAL_TABLET | Freq: Every day | ORAL | Status: DC
  Administered 2018-07-14: 50 mg via ORAL
  Filled 2018-07-13: qty 1

## 2018-07-13 MED ORDER — INSULIN LISPRO 100 UNIT/ML ~~LOC~~ SOLN: 1.0000 [IU] | Freq: Three times a day (TID) | SUBCUTANEOUS | Status: DC

## 2018-07-13 MED ORDER — DELFLEX-LC/2.5% DEXTROSE 394 MOSM/L IP SOLN: INTRAPERITONEAL | Status: DC

## 2018-07-13 MED ORDER — RENA-VITE PO TABS
1.0000 | ORAL_TABLET | Freq: Every day | ORAL | Status: DC
  Administered 2018-07-14 – 2018-07-23 (×10): 1 via ORAL
  Filled 2018-07-13 (×10): qty 1

## 2018-07-13 MED ORDER — LATANOPROST 0.005 % OP SOLN
1.0000 [drp] | Freq: Every day | OPHTHALMIC | Status: DC
  Administered 2018-07-13 – 2018-07-23 (×11): 1 [drp] via OPHTHALMIC
  Filled 2018-07-13 (×2): qty 2.5

## 2018-07-13 MED ORDER — LOPERAMIDE HCL 2 MG PO CAPS
2.0000 mg | ORAL_CAPSULE | Freq: Three times a day (TID) | ORAL | Status: DC | PRN
  Administered 2018-07-15 – 2018-07-19 (×4): 2 mg via ORAL
  Filled 2018-07-13 (×5): qty 1

## 2018-07-13 MED ORDER — HEPARIN 1000 UNIT/ML FOR PERITONEAL DIALYSIS
INTRAPERITONEAL | Status: DC | PRN
  Filled 2018-07-13: qty 5000

## 2018-07-13 MED ORDER — EPHEDRINE SULFATE 50 MG/ML IJ SOLN
INTRAMUSCULAR | Status: DC | PRN
  Administered 2018-07-13: 10 mg via INTRAVENOUS

## 2018-07-13 MED ORDER — GENTAMICIN SULFATE 0.1 % EX CREA: 1.0000 "application " | TOPICAL_CREAM | CUTANEOUS | Status: DC

## 2018-07-13 MED ORDER — ATORVASTATIN CALCIUM 10 MG PO TABS
10.0000 mg | ORAL_TABLET | Freq: Every day | ORAL | Status: DC
  Administered 2018-07-14 – 2018-07-23 (×10): 10 mg via ORAL
  Filled 2018-07-13 (×10): qty 1

## 2018-07-13 MED ORDER — MORPHINE SULFATE (PF) 2 MG/ML IV SOLN
0.5000 mg | Freq: Once | INTRAVENOUS | Status: AC | PRN
  Administered 2018-07-13: 0.5 mg via INTRAVENOUS
  Filled 2018-07-13: qty 1

## 2018-07-13 MED ORDER — SODIUM CHLORIDE 0.9 % IV SOLN
1000.0000 mg | Freq: Once | INTRAVENOUS | Status: AC
  Administered 2018-07-13: 1000 mg via INTRAVENOUS
  Filled 2018-07-13: qty 20

## 2018-07-13 MED ORDER — SODIUM CHLORIDE 0.9% IV SOLUTION: Freq: Once | INTRAVENOUS | Status: AC

## 2018-07-13 MED ORDER — SODIUM CHLORIDE (PF) 0.9 % IJ SOLN
PREFILLED_SYRINGE | INTRAMUSCULAR | Status: DC | PRN
  Administered 2018-07-13: 9 mL

## 2018-07-13 MED ORDER — METOCLOPRAMIDE HCL 5 MG/ML IJ SOLN
10.0000 mg | Freq: Once | INTRAMUSCULAR | Status: AC
  Administered 2018-07-13: 10 mg via INTRAVENOUS
  Filled 2018-07-13: qty 2

## 2018-07-13 MED ORDER — EPINEPHRINE PF 1 MG/10ML IJ SOSY
PREFILLED_SYRINGE | INTRAMUSCULAR | Status: AC
  Filled 2018-07-13: qty 20

## 2018-07-13 MED ORDER — HEPARIN 1000 UNIT/ML FOR PERITONEAL DIALYSIS: 500.0000 [IU] | INTRAMUSCULAR | Status: DC | PRN

## 2018-07-13 MED ORDER — ACETAMINOPHEN 325 MG PO TABS: 650.0000 mg | ORAL_TABLET | ORAL | Status: DC | PRN

## 2018-07-13 MED ORDER — LACTATED RINGERS IV SOLN
INTRAVENOUS | Status: DC | PRN
  Administered 2018-07-13 (×2): via INTRAVENOUS

## 2018-07-13 MED ORDER — PANCRELIPASE (LIP-PROT-AMYL) 12000-38000 UNITS PO CPEP
72000.0000 [IU] | ORAL_CAPSULE | ORAL | Status: DC
  Administered 2018-07-14 – 2018-07-24 (×9): 72000 [IU] via ORAL
  Filled 2018-07-13: qty 2
  Filled 2018-07-13: qty 6
  Filled 2018-07-13 (×2): qty 2
  Filled 2018-07-13 (×2): qty 6
  Filled 2018-07-13 (×2): qty 2
  Filled 2018-07-13: qty 6
  Filled 2018-07-13 (×5): qty 2
  Filled 2018-07-13: qty 6
  Filled 2018-07-13 (×3): qty 2

## 2018-07-13 MED ORDER — PANCRELIPASE (LIP-PROT-AMYL) 12000-38000 UNITS PO CPEP
108000.0000 [IU] | ORAL_CAPSULE | Freq: Three times a day (TID) | ORAL | Status: DC
  Administered 2018-07-14 – 2018-07-24 (×18): 108000 [IU] via ORAL
  Filled 2018-07-13: qty 9
  Filled 2018-07-13 (×2): qty 3
  Filled 2018-07-13 (×7): qty 9
  Filled 2018-07-13: qty 3
  Filled 2018-07-13: qty 9
  Filled 2018-07-13: qty 3
  Filled 2018-07-13 (×3): qty 9
  Filled 2018-07-13: qty 3
  Filled 2018-07-13: qty 9
  Filled 2018-07-13: qty 3
  Filled 2018-07-13: qty 9
  Filled 2018-07-13: qty 3
  Filled 2018-07-13: qty 9
  Filled 2018-07-13 (×2): qty 3
  Filled 2018-07-13: qty 9
  Filled 2018-07-13 (×4): qty 3

## 2018-07-13 MED ORDER — AMITRIPTYLINE HCL 10 MG PO TABS
10.0000 mg | ORAL_TABLET | Freq: Every day | ORAL | Status: DC
  Administered 2018-07-14 – 2018-07-23 (×10): 10 mg via ORAL
  Filled 2018-07-13 (×12): qty 1

## 2018-07-13 MED ORDER — POTASSIUM CHLORIDE 10 MEQ/100ML IV SOLN
10.0000 meq | INTRAVENOUS | Status: DC
  Administered 2018-07-13 (×2): 10 meq via INTRAVENOUS
  Filled 2018-07-13 (×3): qty 100

## 2018-07-13 SURGICAL SUPPLY — 15 items

## 2018-07-13 NOTE — Progress Notes (Signed)
PD catheter open to air. Site clean, dry, and intact. No erythema or drainage noted. Called hemodialysis regarding need for dressing for site. RN instructed to cleanse with chlorhexidine and place either split gauze or 4 x 4 over site. Will dress as instructed and continue to monitor site.

## 2018-07-13 NOTE — Progress Notes (Signed)
eLink Physician-Brief Progress Note Patient Name: Priscilla [name] DOB: [birth year] MRN: [id number]   Date of Service  07/13/2018  HPI/Events of Note  Patient complained of 7/10 epigastric pain cramping in nature. SBP 90s  eICU Interventions  Ordered morphine 0.5 mg IV x 1 Ordered albumin 25%     Intervention Category Major Interventions: Hypotension - evaluation and management Intermediate Interventions: Pain - evaluation and management  Judd Lien 07/13/2018, 5:44 AM

## 2018-07-13 NOTE — H&P (View-Only) (Signed)
Patient was seen and examined at bedside.  Hb has dropped to 6.5, after 1 unit PRBC transfusion and 1 unit platelet transfusion yesterday. Plan repeat EGD today at noon, will give 1 dose IV erythromycin and 1 dose IV Reglan in an attempt to flush the clot noted on endoscopy yesterday.  Patient is not tachycardic but slightly hypotensive, is nothing by mouth and is continued on IV Protonix drip.  No further episodes of melena or coffee-ground emesis since endoscopy.  Multiple comorbidities-end-stage renal disease on peritoneal dialysis, status post CABG, kidney and pancreatic transplant in 2012, mitral valve repair, ischemic cardiomyopathy 2/16, diabetes,recentrepair of right common femoral artery 2 femoral vein fistula with pain patch angioplasty on 07/07/2018.  Last dose of aspirin 325 mg and Plavix 75 mg yesterday.  Ronnette Juniper, M.D.

## 2018-07-13 NOTE — Progress Notes (Signed)
CRITICAL VALUE ALERT  Critical Value: hgb 6.5  Date & Time Notied:  07/13/18 0730  Provider Notified: Loanne Drilling, MD  Orders Received/Actions taken: Give 1U PRBC

## 2018-07-13 NOTE — Consult Note (Signed)
Renal Service Consult Note [facility]  Priscilla [name] 07/13/2018 [place] Requesting Physician:  Dr Merrilee Jansky  Reason for Consult:  ESRD pt on PD w/ bloody stools HPI: The patient is a 48 y.o. year-old with hx of MI/ CABG/ MV repair, HTN, HL, h/o renal transplant, ESRD on CCPD, ICM and anemia of ckd presented to ED with dark bloody stools and anemia.  Pt admitted and went for EGD which showed large clot in stomach.  Was given 2u prbc's and Hb improved to 9.3 at 11pm last night, but is down again to 6.5 this am.  GI planning another EGD today, giving IV erythmo/ reglan to hopefully clear out the clot in the stomach and find the bleeded. We are asked to see for ESRD.    Patient is w/o complaints.  Missed PD last night.  Pt started dialysis in 2010, had renal Rx from 2012- 2014, did HD for one year then switched to PD in 2015.  No recent catheter issues.    ROS  denies CP  no joint pain   no HA  no blurry vision  no rash  no diarrhea  no nausea/ vomiting   Past Medical History  Past Medical History:  Diagnosis Date  . Anemia   . CAD (coronary artery disease)   . Cardiomyopathy, ischemic 06/24/2014  . Diabetes mellitus without complication ([place])   . End stage renal disease ([place])   . Foot drop, left foot   . GERD (gastroesophageal reflux disease)   . H/O kidney transplant   . Hyperlipidemia   . Hypertension   . Myocardial infarction ([place])   . Pancreas transplanted ([place])   . Renal disorder   . 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 S/P mitral valve repair 07/18/14   Past Surgical History  Past Surgical History:  Procedure Laterality Date  . ABDOMINAL AORTOGRAM W/LOWER EXTREMITY N/A 09/19/2017   Procedure: ABDOMINAL AORTOGRAM W/LOWER EXTREMITY;  Surgeon: Angelia Mould, MD;  Location: [place] [facility];  Service: Cardiovascular;  Laterality: N/A;  . CAPD INSERTION N/A 09/08/2013   Procedure: Laparoscopic CAPD peritoneal dialysis catheter placement,  possible lysis of adhesions   ;  Surgeon: Adin Hector, MD;  Location: [place];  Service: General;  Laterality: N/A;  . CARDIAC CATHETERIZATION     06/2014  . CESAREAN SECTION    . COMBINED KIDNEY-PANCREAS TRANSPLANT  05/2010   [facility]..  Pancreas left mid abdomen  . CORONARY ARTERY BYPASS GRAFT N/A 07/18/2014   Procedure: CORONARY ARTERY BYPASS GRAFTING (CABG)X2 LIMA-LAD; SVG-PD;  Surgeon: Melrose Nakayama, MD;  Location: [place];  Service: Open Heart Surgery;  Laterality: N/A;  . ESOPHAGOGASTRODUODENOSCOPY (EGD) WITH PROPOFOL N/A 07/12/2018   Procedure: ESOPHAGOGASTRODUODENOSCOPY (EGD) WITH PROPOFOL;  Surgeon: Clarene Essex, MD;  Location: [place];  Service: Endoscopy;  Laterality: N/A;  . FEMORAL ARTERY EXPLORATION Right 07/07/2018   Procedure: REPAIR RIGHT COMMON FEMORAL ARTERY TO FEMORAL VEIN FISTULA  WITH VEIN PATCH ANGIOPLASTY;  Surgeon: Angelia Mould, MD;  Location: [place];  Service: Vascular;  Laterality: Right;  . INSERTION OF DIALYSIS CATHETER  September 28, 2012   Right upper chest  . LAPAROSCOPIC INSERTION PERITONEAL CATHETER  07/18/2008   Dr Excell Seltzer  . LAPAROSCOPIC REPOSITIONING CAPD CATHETER  08/17/202010   Dr Excell Seltzer  . LAPAROSCOPIC REPOSITIONING CAPD CATHETER  11/08/2008   Dr Excell Seltzer  . LAPAROSCOPIC REPOSITIONING CAPD CATHETER  10/03/2009   Unplugging CAPD catheter Dr Excell Seltzer  . LEFT HEART CATHETERIZATION WITH  CORONARY ANGIOGRAM N/A 06/28/2014   Procedure: LEFT HEART CATHETERIZATION WITH CORONARY ANGIOGRAM;  Surgeon: [name], MD;  Location: [place] CATH LAB;  Service: Cardiovascular;  Laterality: N/A;  . MITRAL VALVE REPAIR N/A 07/18/2014   Procedure: MITRAL VALVE REPAIR (MVR);  Surgeon: Melrose Nakayama, MD;  Location: [place];  Service: Open Heart Surgery;  Laterality: N/A;  . OMENTECTOMY  11/08/2008   Dr Excell Seltzer  . PERIPHERAL VASCULAR INTERVENTION Bilateral 09/19/2017   Procedure: PERIPHERAL VASCULAR INTERVENTION;  Surgeon: Angelia Mould, MD;   Location: [place] CV [facility];  Service: Cardiovascular;  Laterality: Bilateral;  Iliac stents  . TEE WITHOUT CARDIOVERSION N/A 07/18/2014   Procedure: TRANSESOPHAGEAL ECHOCARDIOGRAM (TEE);  Surgeon: Melrose Nakayama, MD;  Location: [place];  Service: Open Heart Surgery;  Laterality: N/A;   Family History  Family History  Problem Relation Age of Onset  . CAD Mother   . Stroke Mother   . COPD Father   . Colon cancer Neg Hx    Social History  reports that she has never smoked. She has never used smokeless tobacco. She reports that she does not drink alcohol or use drugs. Allergies  Allergies  Allergen Reactions  . Pork-Derived Products Other (See Comments)    Pt is [place]   Home medications Prior to Admission medications   Medication Sig Start Date End Date Taking? Authorizing Provider  acetaminophen (TYLENOL) 325 MG tablet Take 650 mg by mouth every 4 (four) hours as needed for mild pain.   Yes [provider]  amitriptyline (ELAVIL) 10 MG tablet Take 10 mg by mouth at bedtime.  01/19/18  Yes [provider]  aspirin 325 MG tablet Take 325 mg by mouth at bedtime.  05/31/10  Yes [provider]  atorvastatin (LIPITOR) 10 MG tablet Take 10 mg by mouth at bedtime.    Yes [provider]  calcitRIOL (ROCALTROL) 0.5 MCG capsule Take 0.5 mcg by mouth at bedtime.    Yes [provider]  calcium acetate (PHOSLO) 667 MG capsule Take 2,001 mg by mouth See admin instructions. Take 3 capsules (2001 mg) by mouth three times daily with meals, take 2 capsules (1334 mg) with snacks   Yes [provider]  carvedilol (COREG) 6.25 MG tablet TAKE 1/2 TABLET TWICE A DAY X 3 WEEKS THEN INCREASE TO 1 TABLET TWICE A DAY Patient taking differently: Take 6.25 mg by mouth 2 (two) times daily with a meal.  10/20/14  Yes Croitoru, Mihai, MD  cloNIDine (CATAPRES) 0.1 MG tablet Take 0.1 mg by mouth at bedtime.  06/10/18  Yes [provider]  clopidogrel  (PLAVIX) 75 MG tablet TAKE 1 TABLET BY MOUTH DAILY Patient taking differently: Take 75 mg by mouth at bedtime.  05/15/18  Yes Angelia Mould, MD  gabapentin (NEURONTIN) 100 MG capsule Take 100 mg by mouth 3 (three) times daily.   Yes [provider]  gentamicin cream (GARAMYCIN) 0.1 % Apply 1 application topically See admin instructions. Apply topically to dialysis site daily after showering   Yes [provider]  insulin lispro (HUMALOG) 100 UNIT/ML injection Inject 1-10 Units into the skin 3 (three) times daily before meals. Per sliding scale   Yes [provider]  insulin NPH Human (HUMULIN N,NOVOLIN N) 100 UNIT/ML injection Inject 20 Units into the skin at bedtime.   Yes [provider]  levonorgestrel (MIRENA) 20 MCG/24HR IUD 1 each by Intrauterine route once. Implanted March 2018   Yes [provider]  lipase/protease/amylase (CREON) 36000 UNITS CPEP capsule Take 72,000-108,000 Units by mouth See admin instructions. Take 3 capsules (108,000 units) by mouth three times daily with meals, take 2 capsules (72,000 units) with snacks   Yes [provider]  loperamide (IMODIUM) 2 MG capsule Take 2 mg by mouth every 8 (eight) hours as needed for diarrhea or loose stools.   Yes [provider]  losartan (COZAAR) 100 MG tablet Take 50 mg by mouth at bedtime.    Yes [provider]  multivitamin (RENA-VIT) TABS tablet Take 1 tablet by mouth at bedtime.   Yes [provider]  potassium chloride SA (K-DUR,KLOR-CON) 20 MEQ tablet Take 1 tablet (20 mEq total) by mouth daily. Patient taking differently: Take 20 mEq by mouth at bedtime.  07/03/18 10/01/18 Yes Meng, Isaac Laud, PA  Travoprost, BAK Free, (TRAVATAN) 0.004 % SOLN ophthalmic solution Place 1 drop into both eyes at bedtime.   Yes [provider]  oxyCODONE-acetaminophen (PERCOCET/ROXICET) 5-325 MG tablet Take 1 tablet by mouth every 6 (six) hours as needed for  moderate pain. Patient not taking: Reported on 07/12/2018 07/08/18   Dagoberto Ligas, PA-C   Liver Function Tests Recent Labs  Lab 07/07/18 1717 07/12/18 1558 07/13/18 0616  AST 29 22 25   ALT 28 5 8   ALKPHOS 218* 228* 123  BILITOT 0.8 0.5 0.6  PROT 7.0 5.9* 4.5*  ALBUMIN 2.0* 1.7* 1.9*   Recent Labs  Lab 07/12/18 1558  LIPASE 54*   CBC Recent Labs  Lab 07/12/18 1558 07/12/18 1927 07/12/18 2317 07/13/18 0616  WBC 12.4*  --  19.8* 16.0*  NEUTROABS 9.7*  --   --   --   HGB 10.3* 8.0* 9.4* 6.5*  HCT 32.8* 25.3* 28.0* 19.0*  MCV 83.0  --  83.1 82.3  PLT 355  --  257 762   Basic Metabolic Panel Recent Labs  Lab 07/07/18 0614 07/07/18 1717 07/12/18 1558 07/13/18 0616  NA 135 135 134* 138  K 3.1* 3.9 3.0* 3.2*  CL  --  98 94* 105  CO2  --  21* 27 21*  GLUCOSE 58* 94 335* 136*  BUN  --  27* 32* 43*  CREATININE  --  9.87* 9.76* 9.39*  CALCIUM  --  8.5* 8.1* 7.3*   Iron/TIBC/Ferritin/ %Sat    Component Value Date/Time   IRON 21 (L) 09/25/2012 0525   TIBC 274 09/25/2012 0525   FERRITIN 28 09/25/2012 0525   IRONPCTSAT 8 (L) 09/25/2012 0525    Vitals:   07/13/18 0731 07/13/18 0816 07/13/18 0830 07/13/18 0845  BP:  (!) 97/51 (!) 87/41 106/65  Pulse:  80  79  Resp:  (!) 22 13 19   Temp: 98.6 F (37 C)  98.7 F (37.1 C) 98.7 F (37.1 C)  TempSrc: Oral Oral Oral Oral  SpO2:  100% 100% 100%  Weight:      Height:       Exam Gen alert, no distress No rash, cyanosis or gangrene Sclera anicteric, throat clear   No jvd or bruits Chest clear bilat RRR no MRG Abd soft ntnd no mass or ascites +bs  LUQ PD cath clean exit site GU defer MS no joint effusions or deformity  Ext no LE edema, no wounds or ulcers Neuro is alert, Ox 3 , nf      Home meds:  - losartan 50 hs/ carvedilol 6.25 bid/ clonidine 0.1 mg hs/ kdur 20 qd  - calc acetate 4 ac tid/ creon tid ac  -  insulin nph 20u qd/ insulin lispro 1-10 ssi tid  - gabapentin 100 tid/ amitriptyline 10 hs/  oxycodone- acetaminophen 1 qid prn  - atorvastatin 10 hs/ clopidogrel 75/ ecasa 81 qd/      PD [facility] EDW 54 kg 6 total exchanges- 5 on the cycler 2 L fill vol 1 hr 30 min dwell time and 1 pause of 1500 mL, 4 hrs, dry day.     Assessment: 1. Upper GIB - for repeat endo today per GI 2. Anemia abl/ ckd - transfuse if Hb < 7 3. CAD hx CABG/ MV repair 4. HTN - bp's soft, hold BP meds 5. HL 6. DM2 on insulin    Plan: 1. Will plan on CCPD tonight, see orders      [facility] 07/13/2018, 10:54 AM

## 2018-07-13 NOTE — Progress Notes (Addendum)
NAMETekila Caillouet, MRN:  [id number], DOB:  08-Jul-1970, LOS: 1 ADMISSION DATE:  07/12/2018, CONSULTATION DATE:  07/12/2018 REFERRING MD:  [name], CHIEF COMPLAINT:  Hematemesis   Brief History   48 yo [name] with complicated PMHx of ESRD on PD, S/P CABG presents for hematemesis and melena.   History of present illness   48 yo [name] with a past history of ESRD on peritoneal dialysis despite kidney transplant, as well as pancreatic transplant, mitral repair and CABG x2; presented this evening to [facility] for vomiting blood. While convalescing from an AV fistula repair, she did have some nausea post-procedure and did vomit once without blood.  The patient abruptly developed frank bloody emesis for two episodes, both seemingly of high volume. In retrospect the patient may have some dark stool heralding the bleed. There was no associated abdominal pain. There was not diarrhea. The patient did not have syncope. She is has a tenuous right  femoral fistula or which she has been on both ASA 325mg   and Plavix 75mg  daily. GI was consulted in the ED and subsequently was taken to the endoscopy suite. EGD revealed large clot burden at the cardia. The patient was transfused a unit of PRBC and planned for re-scope tomorrow for active source and potential control of site.   Past Medical History  She has a past medical history of Anemia, CAD (coronary artery disease), Cardiomyopathy, ischemic (06/24/2014), Diabetes mellitus without complication ([place]), End stage renal disease ([place]), Foot drop, left foot, GERD (gastroesophageal reflux disease), H/O kidney transplant, Hyperlipidemia, Hypertension, Myocardial infarction [facility]), Pancreas transplanted [facility]), Renal disorder, S/P CABG x 2 (07/18/14), and S/P mitral valve repair (07/18/14).  Significant Hospital Events   EGD 1u PRBC  Consults:  GI  Nephrology  Procedures:  EGD  Significant Diagnostic Tests:  EGD - Red blood and large clots in the cardia, in  the gastric fundus, in the gastric body, in the lesser curvature of the stomach and in the gastric antrum. No obvious active bleeding seen  Micro Data:    Antimicrobials:    Interim history/subjective:  S/p EGD. No episodes of hematemesis or diarrhea. Reports abdominal pain overnight that improved with pain medication. Abodminal pain 2/10. Feels fatigued. Received 1 U PRBC and 1 U Platelets. Hg dropped to 6.5  Objective   Blood pressure (!) (P) 97/51, pulse (P) 80, temperature 98.7 F (37.1 C), temperature source Oral, resp. rate (!) (P) 22, height 5' (1.524 m), weight 55.5 kg, SpO2 (P) 100 %.        Intake/Output Summary (Last 24 hours) at 07/13/2018 0825 Last data filed at 07/13/2018 0600 Gross per 24 hour  Intake 3257.76 ml  Output -  Net 3257.76 ml   Filed Weights   07/12/18 1424 07/13/18 0500  Weight: 53.5 kg 55.5 kg   Physical Exam: General: Pale, ill-appearing, no acute distress HENT: [place], AT, OP clear, MMM Eyes: EOMI, no scleral icterus Respiratory: Clear to auscultation bilaterally.  No crackles, wheezing or rales Cardiovascular: RRR, -M/R/G, no JVD GI: BS+, soft, nontender Extremities:-Edema,-tenderness Neuro: AAO x4, CNII-XII grossly intact Skin: Intact, no rashes or bruising Psych: Normal mood, normal affect  Resolved Hospital Problem list     Assessment & Plan:   Acute blood loss anemia secondary to UGIB Last Plavix and ASA taken on 3/15. S/p PRBC x 1, plt x 1 and albumin 5%  Plan: Transfuse additional unit PRBC this morning GI following: Plan for re-scope today. Administer Reglan and erythromycin Hold anticoagulation/antiplatelet  therapy: Plavix PPI gtt Serial CBC Remain NPO  Hemorrhagic Shock Responsive to IVF and blood product resuscitation  Plan: IVF fluid bolus to maintain MAPs > 65 Transfusion as above  Hypokalemia Plan: Replete   ICM/CABG MVR Last echo 2 years ago.  Plan: Hold ASA and Plavix  ESRD S/p kidney and pancreatic  transplant in 2012 Plan: Peritoneal dialysis per Nephrology  DM/post pancrease transplant Plan: CBGs by surveillance Glycemic Control Protocol   Best practice:  Diet: NPO Pain/Anxiety/Delirium protocol (if indicated): -- VAP protocol (if indicated): -- DVT prophylaxis: SCD GI prophylaxis: PPI gtt Glucose control: CGP Mobility: as tolerated Code Status: FC Family Communication: discussed at bedside Disposition: ICU  Labs   CBC: Recent Labs  Lab 07/07/18 1717 07/12/18 1558 07/12/18 1927 07/12/18 2317 07/13/18 0616  WBC 14.2* 12.4*  --  19.8* 16.0*  NEUTROABS  --  9.7*  --   --   --   HGB 11.3* 10.3* 8.0* 9.4* 6.5*  HCT 33.8* 32.8* 25.3* 28.0* 19.0*  MCV 80.5 83.0  --  83.1 82.3  PLT 343 355  --  257 784    Basic Metabolic Panel: Recent Labs  Lab 07/07/18 0614 07/07/18 1717 07/12/18 1558 07/13/18 0616  NA 135 135 134* 138  K 3.1* 3.9 3.0* 3.2*  CL  --  98 94* 105  CO2  --  21* 27 21*  GLUCOSE 58* 94 335* 136*  BUN  --  27* 32* 43*  CREATININE  --  9.87* 9.76* 9.39*  CALCIUM  --  8.5* 8.1* 7.3*   GFR: Estimated Creatinine Clearance: 5.8 mL/min (A) (by C-G formula based on SCr of 9.39 mg/dL (H)). Recent Labs  Lab 07/07/18 1717 07/12/18 1558 07/12/18 2317 07/13/18 0616  WBC 14.2* 12.4* 19.8* 16.0*    Liver Function Tests: Recent Labs  Lab 07/07/18 1717 07/12/18 1558 07/13/18 0616  AST 29 22 25   ALT 28 5 8   ALKPHOS 218* 228* 123  BILITOT 0.8 0.5 0.6  PROT 7.0 5.9* 4.5*  ALBUMIN 2.0* 1.7* 1.9*   Recent Labs  Lab 07/12/18 1558  LIPASE 54*   No results for input(s): AMMONIA in the last 168 hours.  ABG    Component Value Date/Time   PHART 7.269 (L) 07/19/2014 0247   PCO2ART 41.0 07/19/2014 0247   PO2ART 83.0 07/19/2014 0247   HCO3 20.1 05/14/2016 1537   HCO3 20.1 05/14/2016 1537   TCO2 30 09/19/2017 0924   ACIDBASEDEF 7.0 (H) 05/14/2016 1537   ACIDBASEDEF 7.0 (H) 05/14/2016 1537   O2SAT 36.0 05/14/2016 1537   O2SAT 36.0 05/14/2016  1537     Coagulation Profile: Recent Labs  Lab 07/07/18 1717 07/12/18 1558  INR 1.2 1.3*    Cardiac Enzymes: No results for input(s): CKTOTAL, CKMB, CKMBINDEX, TROPONINI in the last 168 hours.  HbA1C: Hgb A1c MFr Bld  Date/Time Value Ref Range Status  06/30/2018 03:20 PM 11.0 (H) 4.8 - 5.6 % Final    Comment:    (NOTE) Pre diabetes:          5.7%-6.4% Diabetes:              >6.4% Glycemic control for   <7.0% adults with diabetes   05/14/2016 07:07 PM 15.0 (H) 4.8 - 5.6 % Final    Comment:    (NOTE)         Pre-diabetes: 5.7 - 6.4         Diabetes: >6.4         Glycemic control  for adults with diabetes: <7.0     CBG: Recent Labs  Lab 07/12/18 2318 07/13/18 0119 07/13/18 0404 07/13/18 0509 07/13/18 0731  GLUCAP 278* 236* 170* 153* 100*      Critical care time: 32 minutes  The patient is critically ill with multiple organ systems failure and requires high complexity decision making for assessment and support, frequent evaluation and titration of therapies, application of advanced monitoring technologies and extensive interpretation of multiple databases.   Rodman Pickle, M.D. [facility] Pulmonary/Critical Care Medicine Pager: [phone] After hours pager: [phone]

## 2018-07-13 NOTE — Progress Notes (Signed)
Pt arrived back to unit from endo. RN called phlebotomy and made aware of arrival. Waiting for post transfusion CBC to be drawn.

## 2018-07-13 NOTE — Transfer of Care (Signed)
Immediate Anesthesia Transfer of Care Note  Patient: Priscilla [name]  Procedure(s) Performed: ESOPHAGOGASTRODUODENOSCOPY (EGD) WITH PROPOFOL (N/A ) HOT HEMOSTASIS (ARGON PLASMA COAGULATION/BICAP) (N/A ) SCLEROTHERAPY HEMOSTASIS CLIP PLACEMENT  Patient Location: Short Stay  Anesthesia Type:MAC  Level of Consciousness: drowsy and patient cooperative  Airway & Oxygen Therapy: Patient Spontanous Breathing and Patient connected to nasal cannula oxygen  Post-op Assessment: Report given to RN and Post -op Vital signs reviewed and stable  Post vital signs: Reviewed and stable  Last Vitals:  Vitals Value Taken Time  BP    Temp    Pulse    Resp 15 07/13/2018  3:10 PM  SpO2    Vitals shown include unvalidated device data.  Last Pain:  Vitals:   07/13/18 1207  TempSrc: Oral  PainSc: 4          Complications: No apparent anesthesia complications

## 2018-07-13 NOTE — Anesthesia Preprocedure Evaluation (Addendum)
Anesthesia Evaluation  Patient identified by MRN, date of birth, ID band Patient awake    Reviewed: Allergy & Precautions, NPO status , Patient's Chart, lab work & pertinent test results  History of Anesthesia Complications Negative for: history of anesthetic complications  Airway Mallampati: II  TM Distance: >3 FB Neck ROM: Full    Dental no notable dental hx. (+) Dental Advisory Given   Pulmonary neg pulmonary ROS,    Pulmonary exam normal        Cardiovascular hypertension, + CAD, + Past MI and +CHF  Normal cardiovascular exam+ Valvular Problems/Murmurs MR      Neuro/Psych negative neurological ROS  negative psych ROS   GI/Hepatic Neg liver ROS, GERD  ,  Endo/Other  diabetes  Renal/GU ESRFRenal diseaseH/o kidney transplant, pancreas transplant  negative genitourinary   Musculoskeletal negative musculoskeletal ROS (+)   Abdominal   Peds negative pediatric ROS (+)  Hematology negative hematology ROS (+)   Anesthesia Other Findings   Reproductive/Obstetrics negative OB ROS                           Anesthesia Physical Anesthesia Plan  ASA: III  Anesthesia Plan: MAC   Post-op Pain Management:    Induction:   PONV Risk Score and Plan: Ondansetron and Propofol infusion  Airway Management Planned: Natural Airway  Additional Equipment:   Intra-op Plan:   Post-operative Plan:   Informed Consent: I have reviewed the patients History and Physical, chart, labs and discussed the procedure including the risks, benefits and alternatives for the proposed anesthesia with the patient or authorized representative who has indicated his/her understanding and acceptance.     Dental advisory given  Plan Discussed with: CRNA and Anesthesiologist  Anesthesia Plan Comments:         Anesthesia Quick Evaluation

## 2018-07-13 NOTE — Op Note (Signed)
[facility] Patient Name: Priscilla [name] Procedure Date : 07/13/2018 MRN: [id number] Attending MD: Ronnette Juniper , MD Date of Birth: [birth year] CSN: [id number] Age: 48 Admit Type: Inpatient Procedure:                Upper GI endoscopy Indications:              Hematemesis, Melena Providers:                Ronnette Juniper, MD, Carollee Sires,                            Technician Referring MD:              Medicines:                Monitored Anesthesia Care Complications:            No immediate complications. Estimated blood loss:                            Minimal. Estimated Blood Loss:     Estimated blood loss was minimal. Procedure:                Pre-Anesthesia Assessment:                           - Prior to the procedure, a History and Physical                            was performed, and patient medications and                            allergies were reviewed. The patient's tolerance of                            previous anesthesia was also reviewed. The risks                            and benefits of the procedure and the sedation                            options and risks were discussed with the patient.                            All questions were answered, and informed consent                            was obtained. Prior Anticoagulants: The patient has                            taken Plavix (clopidogrel) and ASA last dose was 1                            day prior to procedure. ASA Grade Assessment: IV -  A patient with severe systemic disease that is a                            constant threat to life. After reviewing the risks                            and benefits, the patient was deemed in                            satisfactory condition to undergo the procedure.                           After obtaining informed consent, the endoscope was                            passed under direct vision.  Throughout the                            procedure, the patient's blood pressure, pulse, and                            oxygen saturations were monitored continuously. The                            GIF-H190 ([id number]) Olympus gastroscope was                            introduced through the mouth, and advanced to the                            second part of duodenum. The upper GI endoscopy was                            accomplished without difficulty. The patient                            tolerated the procedure well. Scope In: Scope Out: Findings:      The examined esophagus was normal.      The Z-line was regular and was found 38 cm from the incisors.      Clotted blood was found in the cardia, in the gastric fundus and in the       gastric body. Clot was removed with use of BIOVAC, roth's net and       suction using a double channel scope.      One gastric ulcer was found on the greater curvature of the stomach. The       lesion was at least 4 cm in largest dimension.      There was adherent clot as well as visible vessel , possibly 2, noted       within the large ulcer.      Area was successfully injected with 9 mL of a 1:10,000 solution of       epinephrine for hemostasis. Coagulation for hemostasis using bipolar       probe was successful. For hemostasis, two hemostatic clips were  successfully placed.      There was no bleeding at the end of the procedure.      The examined duodenum was normal. Blood was noted, but on lavage, there       was no active bleeding noted. Impression:               - Normal esophagus.                           - Z-line regular, 38 cm from the incisors.                           - Clotted blood in the cardia, in the gastric                            fundus and in the gastric body.                           - Oozing gastric ulcer with visible vessel,                            possibly two and adherent clot noted. Injected.                             Treated with bipolar cautery. Clips were placed.                           - Normal examined duodenum.                           - No specimens collected. Moderate Sedation:      Patient did not receive moderate sedation for this procedure, but       instead received monitored anesthesia care. Recommendation:           - NPO.                           - Give Protonix (pantoprazole): 8 mg/hr IV by                            continuous infusion for 3 days.                           - Avoid ASA and Plavix for at least 3 days.                           - H and H monitoring and transfuse if Hb is below 7. Procedure Code(s):        --- Professional ---                           [phone], Esophagogastroduodenoscopy, flexible,                            transoral; with control of bleeding, any method Diagnosis Code(s):        --- Professional ---  K92.2, Gastrointestinal hemorrhage, unspecified                           K25.4, Chronic or unspecified gastric ulcer with                            hemorrhage                           K92.0, Hematemesis                           K92.1, Melena (includes Hematochezia) CPT copyright 2018 American Medical Association. All rights reserved. The codes documented in this report are preliminary and upon coder review may  be revised to meet current compliance requirements. Ronnette Juniper, MD 07/13/2018 3:12:28 PM This report has been signed electronically. Number of Addenda: 0

## 2018-07-13 NOTE — Anesthesia Postprocedure Evaluation (Signed)
Anesthesia Post Note  Patient: Letonya [name]  Procedure(s) Performed: ESOPHAGOGASTRODUODENOSCOPY (EGD) WITH PROPOFOL (N/A ) HOT HEMOSTASIS (ARGON PLASMA COAGULATION/BICAP) (N/A ) SCLEROTHERAPY HEMOSTASIS CLIP PLACEMENT     Patient location during evaluation: PACU Anesthesia Type: MAC Level of consciousness: awake and alert Pain management: pain level controlled Vital Signs Assessment: post-procedure vital signs reviewed and stable Respiratory status: spontaneous breathing and respiratory function stable Cardiovascular status: stable Postop Assessment: no apparent nausea or vomiting Anesthetic complications: no    Last Vitals:  Vitals:   07/13/18 1906 07/13/18 1944  BP: 108/61   Pulse: 91   Resp: 13   Temp:  36.9 C  SpO2: 100%     Last Pain:  Vitals:   07/13/18 1944  TempSrc: Oral  PainSc:                  Reathel Turi DANIEL

## 2018-07-13 NOTE — Progress Notes (Signed)
Patient was seen and examined at bedside.  Hb has dropped to 6.5, after 1 unit PRBC transfusion and 1 unit platelet transfusion yesterday. Plan repeat EGD today at noon, will give 1 dose IV erythromycin and 1 dose IV Reglan in an attempt to flush the clot noted on endoscopy yesterday.  Patient is not tachycardic but slightly hypotensive, is nothing by mouth and is continued on IV Protonix drip.  No further episodes of melena or coffee-ground emesis since endoscopy.  Multiple comorbidities-end-stage renal disease on peritoneal dialysis, status post CABG, kidney and pancreatic transplant in 2012, mitral valve repair, ischemic cardiomyopathy 2/16, diabetes,recentrepair of right common femoral artery 2 femoral vein fistula with pain patch angioplasty on 07/07/2018.  Last dose of aspirin 325 mg and Plavix 75 mg yesterday.  Ronnette Juniper, M.D.

## 2018-07-13 NOTE — Progress Notes (Addendum)
CRITICAL VALUE ALERT  Critical Value:  hgb 6.5  Date & Time Notied:  07/13/18 0735  Provider Notified: Loanne Drilling  Orders Received/Actions taken: MD paged; awaiting return call. 0746- Dr. Loanne Drilling made aware of low hgb and potassium. Orders received to transfuse PRBC.

## 2018-07-13 NOTE — Brief Op Note (Signed)
07/12/2018 - 07/13/2018  3:12 PM  PATIENT:  Priscilla [name]  48 y.o. female  PRE-OPERATIVE DIAGNOSIS:  GI Bleed  POST-OPERATIVE DIAGNOSIS:  * No post-op diagnosis entered *  PROCEDURE:  Procedure(s): ESOPHAGOGASTRODUODENOSCOPY (EGD) WITH PROPOFOL (N/A) HOT HEMOSTASIS (ARGON PLASMA COAGULATION/BICAP) (N/A) SCLEROTHERAPY HEMOSTASIS CLIP PLACEMENT  SURGEON:  Surgeon(s) and Role:    Ronnette Juniper, MD - Primary  PHYSICIAN ASSISTANT:   ASSISTANTS: Angus Seller, RN, William Dalton, Tech   ANESTHESIA:   MAC  EBL:  80 mL   BLOOD ADMINISTERED:none  DRAINS: none   LOCAL MEDICATIONS USED:  NONE  SPECIMEN:  No Specimen  DISPOSITION OF SPECIMEN:  N/A  COUNTS:  YES  TOURNIQUET:  * No tourniquets in log *  DICTATION: .Dragon Dictation  PLAN OF CARE: Admit to inpatient   PATIENT DISPOSITION:  PACU - hemodynamically stable.   Delay start of Pharmacological VTE agent (>24hrs) due to surgical blood loss or risk of bleeding: yes

## 2018-07-13 NOTE — Anesthesia Postprocedure Evaluation (Signed)
Anesthesia Post Note  Patient: Priscilla [name]  Procedure(s) Performed: ESOPHAGOGASTRODUODENOSCOPY (EGD) WITH PROPOFOL (N/A )     Patient location during evaluation: ICU Anesthesia Type: General Level of consciousness: awake and alert Pain management: pain level controlled Vital Signs Assessment: post-procedure vital signs reviewed and stable Respiratory status: spontaneous breathing, nonlabored ventilation, respiratory function stable and patient connected to nasal cannula oxygen Cardiovascular status: blood pressure returned to baseline and stable Postop Assessment: no apparent nausea or vomiting Anesthetic complications: no    Last Vitals:  Vitals:   07/13/18 0100 07/13/18 0101  BP:  (!) 102/91  Pulse: 77 77  Resp: (!) 0 18  Temp:    SpO2: 100% 100%    Last Pain:  Vitals:   07/13/18 0048  TempSrc: Oral  PainSc:                  Catalina Gravel

## 2018-07-13 NOTE — Interval H&P Note (Signed)
History and Physical Interval Note: 47/female with UGI bleeding on ASA and Plavix for an EGD today. 07/13/2018 12:20 PM  Priscilla [name]  has presented today for EGD, with the diagnosis of GI Bleed.  The various methods of treatment have been discussed with the patient and family. After consideration of risks, benefits and other options for treatment, the patient has consented to  Procedure(s): ESOPHAGOGASTRODUODENOSCOPY (EGD) WITH PROPOFOL (N/A) as a surgical intervention.  The patient's history has been reviewed, patient examined, no change in status, stable for surgery.  I have reviewed the patient's chart and labs.  Questions were answered to the patient's satisfaction.     Ronnette Juniper

## 2018-07-13 NOTE — Anesthesia Procedure Notes (Signed)
Procedure Name: MAC Date/Time: 07/13/2018 1:41 PM Performed by: Kathryne Hitch, CRNA Pre-anesthesia Checklist: Patient identified, Emergency Drugs available, Suction available, Patient being monitored and Timeout performed Patient Re-evaluated:Patient Re-evaluated prior to induction Oxygen Delivery Method: Nasal cannula Induction Type: IV induction Dental Injury: Teeth and Oropharynx as per pre-operative assessment

## 2018-07-13 NOTE — Progress Notes (Signed)
RN stated PIV no longer needed at this time

## 2018-07-14 ENCOUNTER — Encounter (HOSPITAL_COMMUNITY): Payer: Self-pay | Admitting: Gastroenterology

## 2018-07-14 LAB — GLUCOSE, CAPILLARY
Glucose-Capillary: 191 mg/dL — ABNORMAL HIGH (ref 70–99)
Glucose-Capillary: 172 mg/dL — ABNORMAL HIGH (ref 70–99)
Glucose-Capillary: 86 mg/dL (ref 70–99)
Glucose-Capillary: 42 mg/dL — CL (ref 70–99)
Glucose-Capillary: 71 mg/dL (ref 70–99)
Glucose-Capillary: 91 mg/dL (ref 70–99)
Glucose-Capillary: 154 mg/dL — ABNORMAL HIGH (ref 70–99)
Glucose-Capillary: 176 mg/dL — ABNORMAL HIGH (ref 70–99)

## 2018-07-14 LAB — CBC
HCT: 20.6 % — ABNORMAL LOW (ref 36.0–46.0)
Hemoglobin: 7.1 g/dL — ABNORMAL LOW (ref 12.0–15.0)
MCH: 29.6 pg (ref 26.0–34.0)
MCHC: 34.5 g/dL (ref 30.0–36.0)
MCV: 85.8 fL (ref 80.0–100.0)
Platelets: 225 10*3/uL (ref 150–400)
RBC: 2.4 MIL/uL — ABNORMAL LOW (ref 3.87–5.11)
RDW: 15.2 % (ref 11.5–15.5)
WBC: 13.9 10*3/uL — ABNORMAL HIGH (ref 4.0–10.5)
nRBC: 0 % (ref 0.0–0.2)

## 2018-07-14 LAB — PREPARE PLATELET PHERESIS: Unit division: 0

## 2018-07-14 LAB — BASIC METABOLIC PANEL
Anion gap: 12 (ref 5–15)
BUN: 41 mg/dL — AB (ref 6–20)
CO2: 22 mmol/L (ref 22–32)
Calcium: 7.6 mg/dL — ABNORMAL LOW (ref 8.9–10.3)
Chloride: 104 mmol/L (ref 98–111)
Creatinine, Ser: 8.58 mg/dL — ABNORMAL HIGH (ref 0.44–1.00)
GFR calc Af Amer: 6 mL/min — ABNORMAL LOW (ref >60)
GFR calc non Af Amer: 5 mL/min — ABNORMAL LOW (ref >60)
Glucose, Bld: 85 mg/dL (ref 70–99)
POTASSIUM: 3.1 mmol/L — AB (ref 3.5–5.1)
Sodium: 138 mmol/L (ref 135–145)

## 2018-07-14 LAB — HEMOGLOBIN AND HEMATOCRIT, BLOOD
HCT: 22.6 % — ABNORMAL LOW (ref 36.0–46.0)
Hemoglobin: 7.6 g/dL — ABNORMAL LOW (ref 12.0–15.0)

## 2018-07-14 LAB — BPAM PLATELET PHERESIS
Blood Product Expiration Date: 202003162359
ISSUE DATE / TIME: 202003160042
Unit Type and Rh: 6200

## 2018-07-14 MED ORDER — HEPARIN 1000 UNIT/ML FOR PERITONEAL DIALYSIS
INTRAPERITONEAL | Status: DC | PRN
  Filled 2018-07-14: qty 5000

## 2018-07-14 MED ORDER — DELFLEX-LC/2.5% DEXTROSE 394 MOSM/L IP SOLN: INTRAPERITONEAL | Status: DC

## 2018-07-14 MED ORDER — DELFLEX-LC/1.5% DEXTROSE 344 MOSM/L IP SOLN: INTRAPERITONEAL | Status: DC

## 2018-07-14 MED ORDER — GENTAMICIN SULFATE 0.1 % EX CREA
1.0000 "application " | TOPICAL_CREAM | Freq: Every day | CUTANEOUS | Status: DC
  Administered 2018-07-15: 1 via TOPICAL
  Filled 2018-07-14: qty 15

## 2018-07-14 MED ORDER — LACTATED RINGERS IV BOLUS
500.0000 mL | Freq: Once | INTRAVENOUS | Status: AC
  Administered 2018-07-14: 500 mL via INTRAVENOUS

## 2018-07-14 MED ORDER — POTASSIUM CHLORIDE CRYS ER 20 MEQ PO TBCR
20.0000 meq | EXTENDED_RELEASE_TABLET | Freq: Every day | ORAL | Status: DC
  Administered 2018-07-14 – 2018-07-15 (×2): 20 meq via ORAL
  Filled 2018-07-14 (×2): qty 1

## 2018-07-14 MED ORDER — HEPARIN 1000 UNIT/ML FOR PERITONEAL DIALYSIS: 500.0000 [IU] | INTRAMUSCULAR | Status: DC | PRN

## 2018-07-14 NOTE — Progress Notes (Signed)
[name], MRN:  [id number], DOB:  05-08-1970, LOS: 2 ADMISSION DATE:  07/12/2018, CONSULTATION DATE:  07/12/2018 REFERRING MD:  [name], CHIEF COMPLAINT:  Hematemesis   Brief History   48 yo [name] with complicated PMHx of ESRD on PD, S/P CABG presents for hematemesis and melena.   History of present illness   48 yo [name] with a past history of ESRD on peritoneal dialysis despite kidney transplant, as well as pancreatic transplant, mitral repair and CABG x2; presented this evening to [facility] [place] for vomiting blood. While convalescing from an AV fistula repair, she did have some nausea post-procedure and did vomit once without blood.  The patient abruptly developed frank bloody emesis for two episodes, both seemingly of high volume. In retrospect the patient may have some dark stool heralding the bleed. There was no associated abdominal pain. There was not diarrhea. The patient did not have syncope. She is has a tenuous right  femoral fistula or which she has been on both ASA 325mg   and Plavix 75mg  daily. GI was consulted in the ED and subsequently was taken to the endoscopy suite. EGD revealed large clot burden at the cardia. The patient was transfused a unit of PRBC and planned for re-scope tomorrow for active source and potential control of site.   Past Medical History  She has a past medical history of Anemia, CAD (coronary artery disease), Cardiomyopathy, ischemic (06/24/2014), Diabetes mellitus without complication ([place]), End stage renal disease ([place]), Foot drop, left foot, GERD (gastroesophageal reflux disease), H/O kidney transplant, Hyperlipidemia, Hypertension, Myocardial infarction [facility]), Pancreas transplanted 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  Significant Hospital Events   EGD 1u PRBC  Consults:  GI  Nephrology  Procedures:  EGD  Significant Diagnostic Tests:  EGD - Red blood and large clots in the cardia, in  the gastric fundus, in the gastric body, in the lesser curvature of the stomach and in the gastric antrum. No obvious active bleeding seen  Micro Data:    Antimicrobials:    Interim history/subjective:  1x melena this morning Hgb decrease from 8.9 yesterday afternoon to 7.1 this morning Potassium 3.1 this morning, being replaced    Objective   Blood pressure (!) 103/47, pulse 84, temperature 98.6 F (37 C), temperature source Oral, resp. rate 19, height 5' (1.524 m), weight 57.8 kg, SpO2 100 %.        Intake/Output Summary (Last 24 hours) at 07/14/2018 1143 Last data filed at 07/14/2018 0900 Gross per 24 hour  Intake 13518.66 ml  Output 81 ml  Net 13437.66 ml   Filed Weights   07/13/18 1857 07/14/18 0500 07/14/18 0858  Weight: 58.4 kg 58.9 kg 57.8 kg   Physical Exam:  General: chronically ill appearing adult female, laying in bed, NAD  HENT: NCAT, pink mmm, trachea midline, PERRL, non-icteric sclera Respiratory: CTA bilaterally. No accessory muscle recruitment  Cardiovascular: RRR, no r/g/m, no JVD GI: soft, ndnt, normoactive bowel sounds x4  Extremities: Symmetrical bulk and tone, no edema, no tenderness Neuro: AAOx4. Following commands. No focal deficits  Skin: Clean, dry, warm, intact. No rashes  Psych: euthymic mood, congruent affect, appropriate for age and situation   Resolved Hospital Problem list     Assessment & Plan:   Acute blood loss anemia secondary to UGIB Last Plavix and ASA taken on 3/15. S/p PRBC x 1, plt x 1 and albumin 5%  -3/16 EGD with epinephrine administration, cauterization and  2x endoclip placement -3/17: Hgb 7.1 from 8.9  Plan: GI following, appreciate recs  Hold anticoagulation/antiplatelet therapy for another 2 days, at least, per GI  PPI gtt for 72 hours, then BID PPI as per GI  Follow up stool h.pylori per GI  Clear liquid diet per GI  Check H/H this afternoon, transfuse for Hgb < 7   Hemorrhagic Shock Responsive to IVF and blood  product resuscitation  Plan: Goal MAPs > 65 If hypotensive, consider IVF vs Blood Product administration  Transfuse for Hgb < 7  Hypokalemia Plan: Replete PRN Continue to monitor BMP   ICM/CABG MVR Last echo 2 years ago.  Plan: Continue to hold ASA and plavix Cozaar, lipitor   ESRD S/p kidney and pancreatic transplant in 2012 Plan: Peritoneal dialysis per Nephrology  DM/post pancrease transplant  Plan: CBG q4 NPH qHS and SSI  [place]     Best practice:  Diet: Clear liquid Pain/Anxiety/Delirium protocol (if indicated): -- VAP protocol (if indicated): -- DVT prophylaxis: SCD GI prophylaxis: PPI gtt  Glucose control: CBG q4 , qHS NPH + SSI  Mobility: as tolerated Code Status: FC Family Communication: discussed at bedside Disposition: ICU  Labs   CBC: Recent Labs  Lab 07/12/18 1558 07/12/18 1927 07/12/18 2317 07/13/18 0616 07/13/18 1612 07/14/18 1026  WBC 12.4*  --  19.8* 16.0* 20.3* 13.9*  NEUTROABS 9.7*  --   --   --   --   --   HGB 10.3* 8.0* 9.4* 6.5* 8.9* 7.1*  HCT 32.8* 25.3* 28.0* 19.0* 27.0* 20.6*  MCV 83.0  --  83.1 82.3 88.5 85.8  PLT 355  --  257 315 274 272    Basic Metabolic Panel: Recent Labs  Lab 07/07/18 1717 07/12/18 1558 07/13/18 0616 07/14/18 1026  NA 135 134* 138 138  K 3.9 3.0* 3.2* 3.1*  CL 98 94* 105 104  CO2 21* 27 21* 22  GLUCOSE 94 335* 136* 85  BUN 27* 32* 43* 41*  CREATININE 9.87* 9.76* 9.39* 8.58*  CALCIUM 8.5* 8.1* 7.3* 7.6*   GFR: Estimated Creatinine Clearance: 6.5 mL/min (A) (by C-G formula based on SCr of 8.58 mg/dL (H)). Recent Labs  Lab 07/12/18 2317 07/13/18 0616 07/13/18 1612 07/14/18 1026  WBC 19.8* 16.0* 20.3* 13.9*    Liver Function Tests: Recent Labs  Lab 07/07/18 1717 07/12/18 1558 07/13/18 0616  AST 29 22 25   ALT 28 5 8   ALKPHOS 218* 228* 123  BILITOT 0.8 0.5 0.6  PROT 7.0 5.9* 4.5*  ALBUMIN 2.0* 1.7* 1.9*   Recent Labs  Lab 07/12/18 1558  LIPASE 54*   No results for  input(s): AMMONIA in the last 168 hours.  ABG    Component Value Date/Time   PHART 7.269 (L) 07/19/2014 0247   PCO2ART 41.0 07/19/2014 0247   PO2ART 83.0 07/19/2014 0247   HCO3 20.1 05/14/2016 1537   HCO3 20.1 05/14/2016 1537   TCO2 30 09/19/2017 0924   ACIDBASEDEF 7.0 (H) 05/14/2016 1537   ACIDBASEDEF 7.0 (H) 05/14/2016 1537   O2SAT 36.0 05/14/2016 1537   O2SAT 36.0 05/14/2016 1537     Coagulation Profile: Recent Labs  Lab 07/07/18 1717 07/12/18 1558  INR 1.2 1.3*    Cardiac Enzymes: No results for input(s): CKTOTAL, CKMB, CKMBINDEX, TROPONINI in the last 168 hours.  HbA1C: Hgb A1c MFr Bld  Date/Time Value Ref Range Status  06/30/2018 03:20 PM 11.0 (H) 4.8 - 5.6 % Final    Comment:    (NOTE) Pre diabetes:  5.7%-6.4% Diabetes:              >6.4% Glycemic control for   <7.0% adults with diabetes   05/14/2016 07:07 PM 15.0 (H) 4.8 - 5.6 % Final    Comment:    (NOTE)         Pre-diabetes: 5.7 - 6.4         Diabetes: >6.4         Glycemic control for adults with diabetes: <7.0     CBG: Recent Labs  Lab 07/13/18 1944 07/13/18 2342 07/14/18 0359 07/14/18 0723 07/14/18 1059  GLUCAP 100* 197* 191* 172* 86    Critical care time: 30 minutes    Eliseo Gum MSN, AGACNP-BC [place] [phone] If no answer, [phone] 07/14/2018, 11:44 AM

## 2018-07-14 NOTE — Plan of Care (Signed)
Pt currently in bed, receiving dialysis. Multiple stools today. H/H came  back 7.6. No complaints of nausea at this time, pt is on clear liquid diet. No urine output this shift.  Gave 500 bolus of LR per MD Loanne Drilling, due to BP. No complaints of pain this shift either. Vital signs currently stable. No new changes at this time. Will continue to monitor.   Problem: Education: Goal: Knowledge of General Education information will improve Description Including pain rating scale, medication(s)/side effects and non-pharmacologic comfort measures Outcome: Progressing   Problem: Health Behavior/Discharge Planning: Goal: Ability to manage health-related needs will improve Outcome: Progressing   Problem: Clinical Measurements: Goal: Ability to maintain clinical measurements within normal limits will improve Outcome: Progressing Goal: Will remain free from infection Outcome: Progressing Goal: Diagnostic test results will improve Outcome: Progressing Goal: Respiratory complications will improve Outcome: Progressing Goal: Cardiovascular complication will be avoided Outcome: Progressing   Problem: Activity: Goal: Risk for activity intolerance will decrease Outcome: Progressing   Problem: Nutrition: Goal: Adequate nutrition will be maintained Outcome: Progressing   Problem: Coping: Goal: Level of anxiety will decrease Outcome: Progressing   Problem: Elimination: Goal: Will not experience complications related to bowel motility Outcome: Progressing Goal: Will not experience complications related to urinary retention Outcome: Progressing   Problem: Pain Managment: Goal: General experience of comfort will improve Outcome: Progressing   Problem: Safety: Goal: Ability to remain free from injury will improve Outcome: Progressing   Problem: Skin Integrity: Goal: Risk for impaired skin integrity will decrease Outcome: Progressing   Problem: Education: Goal: Ability to identify signs  and symptoms of gastrointestinal bleeding will improve Outcome: Progressing   Problem: Bowel/Gastric: Goal: Will show no signs and symptoms of gastrointestinal bleeding Outcome: Progressing   Problem: Fluid Volume: Goal: Will show no signs and symptoms of excessive bleeding Outcome: Progressing   Problem: Clinical Measurements: Goal: Complications related to the disease process, condition or treatment will be avoided or minimized Outcome: Progressing

## 2018-07-14 NOTE — Progress Notes (Addendum)
Inpatient Diabetes Program Recommendations  AACE/ADA: New Consensus Statement on Inpatient Glycemic Control (2015)  Target Ranges:  Prepandial:   less than 140 mg/dL      Peak postprandial:   less than 180 mg/dL (1-2 hours)      Critically ill patients:  140 - 180 mg/dL   Lab Results  Component Value Date   GLUCAP 91 07/14/2018   HGBA1C 11.0 (H) 06/30/2018     Review of Glycemic Control Results for Priscilla [name], Priscilla [name] (MRN [id number]) as of 07/14/2018 16:06  Ref. Range 07/14/2018 07:23 07/14/2018 10:59 07/14/2018 15:20  Glucose-Capillary Latest Ref Range: 70 - 99 mg/dL 172 (H) 86 91   Diabetes history: T1DM Outpatient Diabetes medications: Humalog SSI 1-10 units TID AC; NPH 20 units QHS Current orders for Inpatient glycemic control: Novolog Standard Correction Scale/SSI (2-6 units) Q4H, NPH 20 units QHS  Inpatient Diabetes Program Recommendations:    Pt w/ hypoglycemic episode today per notes. Consider decreasing Novolog to 1-3 units Q4H.  Addendum @ 9147: Talked to patient. Reviewed patient's current A1c of 11%. Explained what an A1c is and what it measures. Also reviewed goal A1c with patient, importance of good glucose control @ home, and blood sugar goals. Patient states she has been working with her endo, Dr. Chalmers Cater, to get her A1c down.  At home, she usually starts her PD around 8pm and takes Humulin N before bed. Fasting CBGs are usually "extremely high," up to 400 mg/dL per patient. Reports her CBG trends down (160-180's mg/dL) during the day when she is bolusing and covering carbs. Discussed insulin timing in lieu of PD and encouraged discussion with providers. Patient reports endo, Dr. Chalmers Cater, is planning to put her on an insulin pump within the next few weeks, has to see insulin pump trainer first. Patient was planning by April. Encouraged patient to follow-up with Dr. Chalmers Cater as scheduled for insulin pump placement and back up plan with SQ, discuss insulin timing with  regard to PD, importance of checking CBGs and to discuss recommendations for insulin timing at QHS with nehprologist as this could possibly impact glycemic control vs dialysate vs dialyzing the insulin.   Discussed w/ patient the possibility of placing Freestyle Libre prior to discharge to enable close monitoring of glucose trends at home. Educated patient on meter use prior to d/c pending approval of primary team, cost vs benefits, and how to use as a tool to improve glycemic control. Patient to consider. Will follow up with patient on 3/18.  Thanks, Bronson Curb, MSN, RNC-OB Diabetes Coordinator [phone] (8a-5p)

## 2018-07-14 NOTE — Progress Notes (Signed)
[facility] Progress Note  Subjective: did well, sp EGD yesterday w/ Rx of ulcers in stomach. Hb 7's today. No new c/o, feeling better. Had PD overnight, Uf ~400 cc.   Vitals:   07/14/18 0930 07/14/18 1000 07/14/18 1057 07/14/18 1105  BP: (!) 102/51 105/68  (!) 103/47  Pulse: 84 84  84  Resp: 17 14  19   Temp:   98.6 F (37 C)   TempSrc:   Oral   SpO2: 100% 100%  100%  Weight:      Height:        Inpatient medications: . amitriptyline  10 mg Oral QHS  . atorvastatin  10 mg Oral QHS  . gabapentin  100 mg Oral TID  . gentamicin cream  1 application Topical Daily  . insulin aspart  2-6 Units Subcutaneous Q4H  . insulin NPH Human  20 Units Subcutaneous QHS  . latanoprost  1 drop Both Eyes QHS  . lipase/protease/amylase  108,000 Units Oral TID WC  . lipase/protease/amylase  72,000 Units Oral With snacks  . losartan  50 mg Oral QHS  . multivitamin  1 tablet Oral QHS  . potassium chloride  20 mEq Oral Daily   . sodium chloride Stopped (07/13/18 1109)  . dialysis solution 1.5% low-MG/low-CA    . dialysis solution 2.5% low-MG/low-CA    . pantoprozole (PROTONIX) infusion 8 mg/hr (07/14/18 0900)   sodium chloride, acetaminophen, dianeal solution for CAPD/CCPD with heparin, dianeal solution for CAPD/CCPD with heparin, loperamide, promethazine  Iron/TIBC/Ferritin/ %Sat    Component Value Date/Time   IRON 21 (L) 09/25/2012 0525   TIBC 274 09/25/2012 0525   FERRITIN 28 09/25/2012 0525   IRONPCTSAT 8 (L) 09/25/2012 0525    Exam: Gen alert, no distress No rash, cyanosis or gangrene Sclera anicteric, throat clear   No jvd or bruits Chest clear bilat RRR no MRG Abd soft ntnd no mass or ascites +bs  LUQ PD cath clean exit site Ext no LE edema Neuro is alert, Ox 3 , nf      Home meds:  - losartan 50 hs/ carvedilol 6.25 bid/ clonidine 0.1 mg hs/ kdur 20 qd  - calc acetate 4 ac tid/ creon tid ac  - insulin nph 20u qd/ insulin lispro 1-10 ssi tid  -  gabapentin 100 tid/ amitriptyline 10 hs/ oxycodone- acetaminophen 1 qid prn  - atorvastatin 10 hs/ clopidogrel 75/ ecasa 81 qd/      PD [facility] EDW 54 kg 6 total exchanges- 5 on the cycler 2 L fill vol 1 hr 30 min dwell time and 1 pause of 1500 mL, 4 hrs, dry day.     Assessment: 1. Upper GIB - repeat EGD yest 3/16 w/ rx of gastric ulcers 2. Anemia abl/ ckd - sp 2up prbc's so far, Hb low 7's today. Per primary 3. 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 CAD hx CABG/ MV repair 5. HTN - bp's soft, hold BP meds 6. HL 7. DM2 on insulin   P: 1. PD tonight, mix of 1.5 and 2.5% fluids      [facility] 07/14/2018, 11:23 AM  Recent Labs  Lab 07/07/18 1717 07/12/18 1558 07/13/18 0616  NA 135 134* 138  K 3.9 3.0* 3.2*  CL 98 94* 105  CO2 21* 27 21*  GLUCOSE 94 335* 136*  BUN 27* 32* 43*  CREATININE 9.87* 9.76* 9.39*  CALCIUM 8.5* 8.1* 7.3*  ALBUMIN 2.0* 1.7* 1.9*  INR 1.2 1.3*  --  Recent Labs  Lab 07/12/18 1558 07/13/18 0616  AST 22 25  ALT 5 8  ALKPHOS 228* 123  BILITOT 0.5 0.6  PROT 5.9* 4.5*   Recent Labs  Lab 07/12/18 1558  07/13/18 1612 07/14/18 1026  WBC 12.4*   < > 20.3* 13.9*  NEUTROABS 9.7*  --   --   --   HGB 10.3*   < > 8.9* 7.1*  HCT 32.8*   < > 27.0* 20.6*  MCV 83.0   < > 88.5 85.8  PLT 355   < > 274 225   < > = values in this interval not displayed.

## 2018-07-14 NOTE — Progress Notes (Signed)
Subjective: The patient was seen and examined at bedside. She had one episode of melanotic bowel movement after EGD which was likely residual blood. No further episodes of hematemesis, no bowel movements overnight or today morning. She denies abdominal pain.  Objective: Vital signs in last 24 hours: Temp:  [97.8 F (36.6 C)-98.7 F (37.1 C)] 98.5 F (36.9 C) (03/17 0401) Pulse Rate:  [79-109] 98 (03/17 0730) Resp:  [0-31] 20 (03/17 0730) BP: (83-138)/(31-75) 115/66 (03/17 0730) SpO2:  [100 %] 100 % (03/17 0730) Weight:  [58.4 kg-58.9 kg] 58.9 kg (03/17 0500) Weight change: 4.876 kg Last BM Date: 07/12/18  PE: not in distress, appears frail GENERAL:mild pallor ABDOMEN:soft, nondistended, nontender EXTREMITIES:no deformity  Lab Results: Results for orders placed or performed during the hospital encounter of 07/12/18 (from the past 48 hour(s))  POC occult blood, ED     Status: Abnormal   Collection Time: 07/12/18  3:23 PM  Result Value Ref Range   Fecal Occult Bld POSITIVE (A) NEGATIVE  CBC with Differential/Platelet     Status: Abnormal   Collection Time: 07/12/18  3:58 PM  Result Value Ref Range   WBC 12.4 (H) 4.0 - 10.5 K/uL   RBC 3.95 3.87 - 5.11 MIL/uL   Hemoglobin 10.3 (L) 12.0 - 15.0 g/dL   HCT 32.8 (L) 36.0 - 46.0 %   MCV 83.0 80.0 - 100.0 fL   MCH 26.1 26.0 - 34.0 pg   MCHC 31.4 30.0 - 36.0 g/dL   RDW 13.9 11.5 - 15.5 %   Platelets 355 150 - 400 K/uL   nRBC 0.0 0.0 - 0.2 %   Neutrophils Relative % 79 %   Neutro Abs 9.7 (H) 1.7 - 7.7 K/uL   Lymphocytes Relative 9 %   Lymphs Abs 1.2 0.7 - 4.0 K/uL   Monocytes Relative 6 %   Monocytes Absolute 0.8 0.1 - 1.0 K/uL   Eosinophils Relative 5 %   Eosinophils Absolute 0.6 (H) 0.0 - 0.5 K/uL   Basophils Relative 0 %   Basophils Absolute 0.0 0.0 - 0.1 K/uL   Immature Granulocytes 1 %   Abs Immature Granulocytes 0.09 (H) 0.00 - 0.07 K/uL    Comment: Performed at [facility], [address] [place], [place]  [address]  Comprehensive metabolic panel     Status: Abnormal   Collection Time: 07/12/18  3:58 PM  Result Value Ref Range   Sodium 134 (L) 135 - 145 mmol/L   Potassium 3.0 (L) 3.5 - 5.1 mmol/L   Chloride 94 (L) 98 - 111 mmol/L   CO2 27 22 - 32 mmol/L   Glucose, Bld 335 (H) 70 - 99 mg/dL   BUN 32 (H) 6 - 20 mg/dL   Creatinine, Ser 9.76 (H) 0.44 - 1.00 mg/dL   Calcium 8.1 (L) 8.9 - 10.3 mg/dL   Total Protein 5.9 (L) 6.5 - 8.1 g/dL   Albumin 1.7 (L) 3.5 - 5.0 g/dL   AST 22 15 - 41 U/L   ALT 5 0 - 44 U/L   Alkaline Phosphatase 228 (H) 38 - 126 U/L   Total Bilirubin 0.5 0.3 - 1.2 mg/dL   GFR calc non Af Amer 4 (L) >60 mL/min   GFR calc Af Amer 5 (L) >60 mL/min   Anion gap 13 5 - 15    Comment: Performed at [facility], [place] [address], [place], [place] [address]  Lipase, blood     Status: Abnormal   Collection Time:  07/12/18  3:58 PM  Result Value Ref Range   Lipase 54 (H) 11 - 51 U/L    Comment: Performed at [facility] [address], [place], [place] [address]  Type and screen     Status: None (Preliminary result)   Collection Time: 07/12/18  3:58 PM  Result Value Ref Range   ABO/RH(D) O POS    Antibody Screen NEG    Sample Expiration 07/15/2018    Unit Number [id number]    Blood Component Type RBC LR PHER2    Unit division 00    Status of Unit ISSUED,FINAL    Transfusion Status OK TO TRANSFUSE    Crossmatch Result      Compatible Performed at [facility], [place] [address], [place], [place] [address]    Unit Number [id number]    Blood Component Type RBC LR PHER2    Unit division 00    Status of Unit ISSUED,FINAL    Transfusion Status OK TO TRANSFUSE    Crossmatch Result Compatible    Unit Number [id number]    Blood Component Type RBC LR PHER2    Unit division 00    Status of Unit ALLOCATED    Transfusion Status OK TO TRANSFUSE    Crossmatch Result Compatible    Unit Number [id number]    Blood Component Type RBC LR PHER1    Unit  division 00    Status of Unit ALLOCATED    Transfusion Status OK TO TRANSFUSE    Crossmatch Result Compatible   Protime-INR     Status: Abnormal   Collection Time: 07/12/18  3:58 PM  Result Value Ref Range   Prothrombin Time 16.1 (H) 11.4 - 15.2 seconds   INR 1.3 (H) 0.8 - 1.2    Comment: (NOTE) INR goal varies based on device and disease states. Performed at [facility], [place] [address], [place], [place] [address]   APTT     Status: None   Collection Time: 07/12/18  3:58 PM  Result Value Ref Range   aPTT 26 24 - 36 seconds    Comment: Performed at [facility] [address] [place], [place] [address]  I-Stat beta hCG blood, ED     Status: Abnormal   Collection Time: 07/12/18  4:12 PM  Result Value Ref Range   I-stat hCG, quantitative 5.7 (H) <5 mIU/mL   Comment 3            Comment:   GEST. AGE      CONC.  (mIU/mL)   <=1 WEEK        5 - 50     2 WEEKS       50 - 500     3 WEEKS       100 - 10,000     4 WEEKS     1,000 - 30,000        FEMALE AND NON-PREGNANT FEMALE:     LESS THAN 5 mIU/mL   Hemoglobin and hematocrit, blood     Status: Abnormal   Collection Time: 07/12/18  7:27 PM  Result Value Ref Range   Hemoglobin 8.0 (L) 12.0 - 15.0 g/dL    Comment: REPEATED TO VERIFY   HCT 25.3 (L) 36.0 - 46.0 %    Comment: Performed at [facility] [address] [place], [place] [address]  Prepare RBC     Status: None   Collection Time: 07/12/18  8:07 PM  Result Value Ref Range   Order Confirmation      ORDER PROCESSED BY BLOOD BANK Performed at [facility], [place] [address], [place], [place] [address]   Glucose, capillary     Status: Abnormal   Collection Time: 07/12/18  9:24 PM  Result Value Ref Range   Glucose-Capillary 274 (H) 70 - 99 mg/dL  Glucose, capillary     Status: Abnormal   Collection Time: 07/12/18 10:24 PM  Result Value Ref Range   Glucose-Capillary 264 (H) 70 - 99 mg/dL  MRSA PCR Screening     Status: None   Collection Time:  07/12/18 10:43 PM  Result Value Ref Range   MRSA by PCR NEGATIVE NEGATIVE    Comment:        The GeneXpert MRSA Assay (FDA approved for NASAL specimens only), is one component of a comprehensive MRSA colonization surveillance program. It is not intended to diagnose MRSA infection nor to guide or monitor treatment for MRSA infections. Performed at [facility], [place] [address], [place], [place] [address]   Prepare Pheresed Platelets     Status: None   Collection Time: 07/12/18 10:49 PM  Result Value Ref Range   Unit Number [id number]    Blood Component Type PLTPHER LR2    Unit division 00    Status of Unit ISSUED,FINAL    Transfusion Status      OK TO TRANSFUSE Performed at [facility] [address], [place], [place] [address]   CBC     Status: Abnormal   Collection Time: 07/12/18 11:17 PM  Result Value Ref Range   WBC 19.8 (H) 4.0 - 10.5 K/uL   RBC 3.37 (L) 3.87 - 5.11 MIL/uL   Hemoglobin 9.4 (L) 12.0 - 15.0 g/dL   HCT 28.0 (L) 36.0 - 46.0 %   MCV 83.1 80.0 - 100.0 fL   MCH 27.9 26.0 - 34.0 pg   MCHC 33.6 30.0 - 36.0 g/dL   RDW 13.7 11.5 - 15.5 %   Platelets 257 150 - 400 K/uL   nRBC 0.0 0.0 - 0.2 %    Comment: Performed at [facility], [place] [address], [place], [place] [address]  Glucose, capillary     Status: Abnormal   Collection Time: 07/12/18 11:18 PM  Result Value Ref Range   Glucose-Capillary 278 (H) 70 - 99 mg/dL  Glucose, capillary     Status: Abnormal   Collection Time: 07/13/18  1:19 AM  Result Value Ref Range   Glucose-Capillary 236 (H) 70 - 99 mg/dL  Glucose, capillary     Status: Abnormal   Collection Time: 07/13/18  4:04 AM  Result Value Ref Range   Glucose-Capillary 170 (H) 70 - 99 mg/dL  Glucose, capillary     Status: Abnormal   Collection Time: 07/13/18  5:09 AM  Result Value Ref Range   Glucose-Capillary 153 (H) 70 - 99 mg/dL  Comprehensive metabolic panel     Status: Abnormal   Collection Time: 07/13/18  6:16 AM   Result Value Ref Range   Sodium 138 135 - 145 mmol/L   Potassium 3.2 (L) 3.5 - 5.1 mmol/L   Chloride 105 98 - 111 mmol/L   CO2 21 (L) 22 - 32 mmol/L   Glucose, Bld 136 (H) 70 - 99 mg/dL   BUN 43 (H) 6 - 20 mg/dL   Creatinine, Ser 9.39 (H) 0.44 - 1.00 mg/dL   Calcium 7.3 (L) 8.9 - 10.3 mg/dL  Total Protein 4.5 (L) 6.5 - 8.1 g/dL   Albumin 1.9 (L) 3.5 - 5.0 g/dL   AST 25 15 - 41 U/L   ALT 8 0 - 44 U/L   Alkaline Phosphatase 123 38 - 126 U/L   Total Bilirubin 0.6 0.3 - 1.2 mg/dL   GFR calc non Af Amer 5 (L) >60 mL/min   GFR calc Af Amer 5 (L) >60 mL/min   Anion gap 12 5 - 15    Comment: Performed at [facility] [address] [place] [address]  CBC     Status: Abnormal   Collection Time: 07/13/18  6:16 AM  Result Value Ref Range   WBC 16.0 (H) 4.0 - 10.5 K/uL   RBC 2.31 (L) 3.87 - 5.11 MIL/uL   Hemoglobin 6.5 (LL) 12.0 - 15.0 g/dL    Comment: REPEATED TO VERIFY THIS CRITICAL RESULT HAS VERIFIED AND BEEN CALLED TO CHRISTINA SALAS RN BY SHANNON FLEMING ON 03 16 2020 AT 0731, AND HAS BEEN READ BACK.     HCT 19.0 (L) 36.0 - 46.0 %   MCV 82.3 80.0 - 100.0 fL   MCH 28.1 26.0 - 34.0 pg   MCHC 34.2 30.0 - 36.0 g/dL   RDW 13.8 11.5 - 15.5 %   Platelets 315 150 - 400 K/uL   nRBC 0.0 0.0 - 0.2 %    Comment: Performed at [facility], [place] [address], [place], [place] [address]  Glucose, capillary     Status: Abnormal   Collection Time: 07/13/18  7:31 AM  Result Value Ref Range   Glucose-Capillary 100 (H) 70 - 99 mg/dL  Prepare RBC     Status: None   Collection Time: 07/13/18  7:55 AM  Result Value Ref Range   Order Confirmation      ORDER PROCESSED BY BLOOD BANK BLOOD ALREADY AVAILABLE Performed at [facility], [place] [address] [place], [place] [address]   CBC     Status: Abnormal   Collection Time: 07/13/18  4:12 PM  Result Value Ref Range   WBC 20.3 (H) 4.0 - 10.5 K/uL   RBC 3.05 (L) 3.87 - 5.11 MIL/uL   Hemoglobin 8.9 (L) 12.0 - 15.0 g/dL     Comment: REPEATED TO VERIFY POST TRANSFUSION SPECIMEN    HCT 27.0 (L) 36.0 - 46.0 %   MCV 88.5 80.0 - 100.0 fL    Comment: POST TRANSFUSION SPECIMEN REPEATED TO VERIFY    MCH 29.2 26.0 - 34.0 pg   MCHC 33.0 30.0 - 36.0 g/dL   RDW 15.5 11.5 - 15.5 %   Platelets 274 150 - 400 K/uL   nRBC 0.0 0.0 - 0.2 %    Comment: Performed at [facility], [place] [address] [place], [place] [address]  Glucose, capillary     Status: None   Collection Time: 07/13/18  4:18 PM  Result Value Ref Range   Glucose-Capillary 84 70 - 99 mg/dL  Glucose, capillary     Status: Abnormal   Collection Time: 07/13/18  7:44 PM  Result Value Ref Range   Glucose-Capillary 100 (H) 70 - 99 mg/dL  Glucose, capillary     Status: Abnormal   Collection Time: 07/13/18 11:42 PM  Result Value Ref Range   Glucose-Capillary 197 (H) 70 - 99 mg/dL  Glucose, capillary     Status: Abnormal   Collection Time: 07/14/18  3:59 AM  Result Value Ref Range   Glucose-Capillary 191 (  H) 70 - 99 mg/dL  Glucose, capillary     Status: Abnormal   Collection Time: 07/14/18  7:23 AM  Result Value Ref Range   Glucose-Capillary 172 (H) 70 - 99 mg/dL    Studies/Results: No results found.  Medications: I have reviewed the patient's current medications.  Assessment: Upper GI bleeding-gastric ulcer with adherent clot and visible vessels noted on EGD yesterday. Treated with epinephrine injection, bipolar cauterization and placement of 2 endoclips. Hemoglobin stable at 8.9 Not tachycardic, blood pressure slightly low  Multiple comorbidities-end-stage renal disease on peritoneal dialysis, history of MI, CABG, mitral valve repair, hypertension, history of renal and pancreatic transplant, ischemic cardiomyopathy and anemia   Plan: Clear liquid diet today Hold aspirin and Plavix for at least another 2 days Continue IV Protonix drip for total of 72 hours Monitor H&H and transfuse as needed Stool for H. Pylori antigen, treat if  found Thereafter, recommend PPI twice a day for at least 8 weeks and then indefinitely once a day as patient needs to be on aspirin and Plavix as an outpatient.   Ronnette Juniper 07/14/2018, 8:30 AM   Pager [phone] If no answer or after 5 PM call [phone]

## 2018-07-14 NOTE — Progress Notes (Signed)
CRITICAL VALUE ALERT  Critical Value: 38 POC  Date & Time Notied:  07/14/18 1410  Orders Received/Actions taken: gave orange juice and will recheck Blood sugar in 15 mins

## 2018-07-15 DIAGNOSIS — E876 Hypokalemia: Secondary | ICD-10-CM

## 2018-07-15 LAB — GLUCOSE, CAPILLARY
GLUCOSE-CAPILLARY: 52 mg/dL — AB (ref 70–99)
Glucose-Capillary: 95 mg/dL (ref 70–99)
Glucose-Capillary: 71 mg/dL (ref 70–99)
Glucose-Capillary: 60 mg/dL — ABNORMAL LOW (ref 70–99)
Glucose-Capillary: 60 mg/dL — ABNORMAL LOW (ref 70–99)
Glucose-Capillary: 121 mg/dL — ABNORMAL HIGH (ref 70–99)

## 2018-07-15 LAB — HEMOGLOBIN AND HEMATOCRIT, BLOOD
HCT: 23.5 % — ABNORMAL LOW (ref 36.0–46.0)
Hemoglobin: 7.7 g/dL — ABNORMAL LOW (ref 12.0–15.0)

## 2018-07-15 MED ORDER — SODIUM CHLORIDE 0.9% IV SOLUTION: Freq: Once | INTRAVENOUS | Status: DC

## 2018-07-15 MED ORDER — DELFLEX-LC/4.25% DEXTROSE 483 MOSM/L IP SOLN
INTRAPERITONEAL | Status: DC
  Administered 2018-07-15: 18:00:00 via INTRAPERITONEAL

## 2018-07-15 MED ORDER — HEPARIN 1000 UNIT/ML FOR PERITONEAL DIALYSIS
INTRAPERITONEAL | Status: DC | PRN
  Filled 2018-07-15: qty 5000

## 2018-07-15 MED ORDER — GENTAMICIN SULFATE 0.1 % EX CREA
1.0000 "application " | TOPICAL_CREAM | Freq: Every day | CUTANEOUS | Status: DC
  Administered 2018-07-15: 1 via TOPICAL
  Filled 2018-07-15: qty 15

## 2018-07-15 MED ORDER — DELFLEX-LC/2.5% DEXTROSE 394 MOSM/L IP SOLN: INTRAPERITONEAL | Status: DC

## 2018-07-15 MED ORDER — PANTOPRAZOLE SODIUM 40 MG IV SOLR
40.0000 mg | Freq: Two times a day (BID) | INTRAVENOUS | Status: DC
  Administered 2018-07-15 – 2018-07-16 (×4): 40 mg via INTRAVENOUS
  Filled 2018-07-15 (×4): qty 40

## 2018-07-15 MED ORDER — HEPARIN 1000 UNIT/ML FOR PERITONEAL DIALYSIS: 500.0000 [IU] | INTRAMUSCULAR | Status: DC | PRN

## 2018-07-15 MED ORDER — DELFLEX-LC/4.25% DEXTROSE 483 MOSM/L IP SOLN: INTRAPERITONEAL | Status: DC

## 2018-07-15 MED ORDER — GENTAMICIN SULFATE 0.1 % EX CREA: 1.0000 "application " | TOPICAL_CREAM | Freq: Every day | CUTANEOUS | Status: DC

## 2018-07-15 MED ORDER — DELFLEX-LC/2.5% DEXTROSE 394 MOSM/L IP SOLN
INTRAPERITONEAL | Status: DC
  Administered 2018-07-15: 20:00:00 via INTRAPERITONEAL

## 2018-07-15 MED ORDER — INSULIN ASPART 100 UNIT/ML ~~LOC~~ SOLN
0.0000 [IU] | SUBCUTANEOUS | Status: DC
  Administered 2018-07-16: 1 [IU] via SUBCUTANEOUS
  Administered 2018-07-16: 2 [IU] via SUBCUTANEOUS

## 2018-07-15 NOTE — Progress Notes (Signed)
[facility] Progress Note  Subjective: seen in room, no distress  Vitals:   07/15/18 0900 07/15/18 0910 07/15/18 1000 07/15/18 1053  BP: (!) 87/18 (!) 106/52 (!) 96/40   Pulse: (!) 57 (!) 46    Resp: 17 (!) 25 12   Temp:    98.3 F (36.8 C)  TempSrc:    Oral  SpO2: 100% 100% 100%   Weight:      Height:        Inpatient medications: . amitriptyline  10 mg Oral QHS  . atorvastatin  10 mg Oral QHS  . gabapentin  100 mg Oral TID  . gentamicin cream  1 application Topical Daily  . insulin aspart  2-6 Units Subcutaneous Q4H  . insulin NPH Human  20 Units Subcutaneous QHS  . latanoprost  1 drop Both Eyes QHS  . lipase/protease/amylase  108,000 Units Oral TID WC  . lipase/protease/amylase  72,000 Units Oral With snacks  . losartan  50 mg Oral QHS  . multivitamin  1 tablet Oral QHS  . pantoprazole (PROTONIX) IV  40 mg Intravenous Q12H  . potassium chloride  20 mEq Oral Daily   . sodium chloride Stopped (07/13/18 1109)  . dialysis solution 1.5% low-MG/low-CA    . dialysis solution 2.5% low-MG/low-CA     sodium chloride, acetaminophen, dianeal solution for CAPD/CCPD with heparin, dianeal solution for CAPD/CCPD with heparin, loperamide, promethazine  Iron/TIBC/Ferritin/ %Sat    Component Value Date/Time   IRON 21 (L) 09/25/2012 0525   TIBC 274 09/25/2012 0525   FERRITIN 28 09/25/2012 0525   IRONPCTSAT 8 (L) 09/25/2012 0525    Exam: Gen alert, no distress No jvd or bruits Chest clear bilat RRR no MRG Abd soft ntnd no mass or ascites +bs  LUQ PD cath clean exit site Ext no LE edema Neuro is alert, Ox 3 , nf      Home meds:  - losartan 50 hs/ carvedilol 6.25 bid/ clonidine 0.1 mg hs/ kdur 20 qd  - calc acetate 4 ac tid/ creon tid ac  - insulin nph 20u qd/ insulin lispro 1-10 ssi tid  - gabapentin 100 tid/ amitriptyline 10 hs/ oxycodone- acetaminophen 1 qid prn  - atorvastatin 10 hs/ clopidogrel 75/ ecasa 81 qd/      PD [facility] EDW 54 kg 6 total  exchanges- 5 on the cycler 2 L fill vol 1 hr 30 min dwell time and 1 pause of 1500 mL, 4 hrs, dry day.     Assessment/ Plan: 1. Upper GIB - repeat EGD 3/16 w/ rx of gastric ulcers x2.  Bleeding appears to be abating.  Hb 7.7 this am, stools normalizing.  2. Anemia abl/ ckd - sp 2up prbc's, Hb 7.7 will give one more unit prbc's today. Pt sig fatigued.  3. 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 ^UF to all 2.5% tonight.  4. CAD hx CABG/ MV repair 5. HTN - bp's soft, dc losartan.  6. HL 7. DM2 on insulin      [facility] 07/15/2018, 11:19 AM  Recent Labs  Lab 07/12/18 1558 07/13/18 0616 07/14/18 1026  NA 134* 138 138  K 3.0* 3.2* 3.1*  CL 94* 105 104  CO2 27 21* 22  GLUCOSE 335* 136* 85  BUN 32* 43* 41*  CREATININE 9.76* 9.39* 8.58*  CALCIUM 8.1* 7.3* 7.6*  ALBUMIN 1.7* 1.9*  --   INR 1.3*  --   --    Recent Labs  Lab 07/12/18  1558 07/13/18 0616  AST 22 25  ALT 5 8  ALKPHOS 228* 123  BILITOT 0.5 0.6  PROT 5.9* 4.5*   Recent Labs  Lab 07/12/18 1558  07/13/18 1612 07/14/18 1026 07/14/18 1527 07/15/18 1009  WBC 12.4*   < > 20.3* 13.9*  --   --   NEUTROABS 9.7*  --   --   --   --   --   HGB 10.3*   < > 8.9* 7.1* 7.6* 7.7*  HCT 32.8*   < > 27.0* 20.6* 22.6* 23.5*  MCV 83.0   < > 88.5 85.8  --   --   PLT 355   < > 274 225  --   --    < > = values in this interval not displayed.

## 2018-07-15 NOTE — Plan of Care (Signed)
  Problem: Health Behavior/Discharge Planning: Goal: Ability to manage health-related needs will improve Outcome: Progressing   Problem: Clinical Measurements: Goal: Ability to maintain clinical measurements within normal limits will improve Outcome: Progressing Goal: Will remain free from infection Outcome: Progressing Goal: Respiratory complications will improve Outcome: Progressing   Problem: Coping: Goal: Level of anxiety will decrease Outcome: Progressing   Problem: Safety: Goal: Ability to remain free from injury will improve Outcome: Progressing   Problem: Bowel/Gastric: Goal: Will show no signs and symptoms of gastrointestinal bleeding Outcome: Progressing

## 2018-07-15 NOTE — Progress Notes (Signed)
Subjective: The patient was seen and examined at bedside.  Reports having 5-6 dark bowel movements yesterday, however her bowel movement today is semi-formed and yellow in color.  She denies nausea, vomiting or abdominal pain.  Objective: Vital signs in last 24 hours: Temp:  [97.3 F (36.3 C)-98.6 F (37 C)] 98.1 F (36.7 C) (03/18 0718) Pulse Rate:  [25-88] 46 (03/18 0910) Resp:  [12-29] 25 (03/18 0910) BP: (79-142)/(18-102) 106/52 (03/18 0910) SpO2:  [88 %-100 %] 100 % (03/18 0910) Weight:  [58.2 kg-59.7 kg] 58.2 kg (03/18 0630) Weight change: -0.6 kg Last BM Date: 07/14/18  PE: Appears more comfortable than yesterday GENERAL: prominent pallor ABDOMEN: Nondistended EXTREMITIES: No deformity  Lab Results: Results for orders placed or performed during the hospital encounter of 07/12/18 (from the past 48 hour(s))  CBC     Status: Abnormal   Collection Time: 07/13/18  4:12 PM  Result Value Ref Range   WBC 20.3 (H) 4.0 - 10.5 K/uL   RBC 3.05 (L) 3.87 - 5.11 MIL/uL   Hemoglobin 8.9 (L) 12.0 - 15.0 g/dL    Comment: REPEATED TO VERIFY POST TRANSFUSION SPECIMEN    HCT 27.0 (L) 36.0 - 46.0 %   MCV 88.5 80.0 - 100.0 fL    Comment: POST TRANSFUSION SPECIMEN REPEATED TO VERIFY    MCH 29.2 26.0 - 34.0 pg   MCHC 33.0 30.0 - 36.0 g/dL   RDW 15.5 11.5 - 15.5 %   Platelets 274 150 - 400 K/uL   nRBC 0.0 0.0 - 0.2 %    Comment: Performed at [facility], [place] [address] [place], [place] [address]  Glucose, capillary     Status: None   Collection Time: 07/13/18  4:18 PM  Result Value Ref Range   Glucose-Capillary 84 70 - 99 mg/dL  Glucose, capillary     Status: Abnormal   Collection Time: 07/13/18  7:44 PM  Result Value Ref Range   Glucose-Capillary 100 (H) 70 - 99 mg/dL  Glucose, capillary     Status: Abnormal   Collection Time: 07/13/18 11:42 PM  Result Value Ref Range   Glucose-Capillary 197 (H) 70 - 99 mg/dL  Glucose, capillary     Status: Abnormal   Collection Time:  07/14/18  3:59 AM  Result Value Ref Range   Glucose-Capillary 191 (H) 70 - 99 mg/dL  Glucose, capillary     Status: Abnormal   Collection Time: 07/14/18  7:23 AM  Result Value Ref Range   Glucose-Capillary 172 (H) 70 - 99 mg/dL  Basic metabolic panel     Status: Abnormal   Collection Time: 07/14/18 10:26 AM  Result Value Ref Range   Sodium 138 135 - 145 mmol/L   Potassium 3.1 (L) 3.5 - 5.1 mmol/L   Chloride 104 98 - 111 mmol/L   CO2 22 22 - 32 mmol/L   Glucose, Bld 85 70 - 99 mg/dL   BUN 41 (H) 6 - 20 mg/dL   Creatinine, Ser 8.58 (H) 0.44 - 1.00 mg/dL   Calcium 7.6 (L) 8.9 - 10.3 mg/dL   GFR calc non Af Amer 5 (L) >60 mL/min   GFR calc Af Amer 6 (L) >60 mL/min   Anion gap 12 5 - 15    Comment: Performed at [facility], [place]. [address], [place], [place] [address]  CBC     Status: Abnormal   Collection Time: 07/14/18 10:26 AM  Result Value Ref Range   WBC 13.9 (H) 4.0 -  10.5 K/uL   RBC 2.40 (L) 3.87 - 5.11 MIL/uL   Hemoglobin 7.1 (L) 12.0 - 15.0 g/dL    Comment: REPEATED TO VERIFY DELTA CHECK NOTED    HCT 20.6 (L) 36.0 - 46.0 %   MCV 85.8 80.0 - 100.0 fL   MCH 29.6 26.0 - 34.0 pg   MCHC 34.5 30.0 - 36.0 g/dL   RDW 15.2 11.5 - 15.5 %   Platelets 225 150 - 400 K/uL   nRBC 0.0 0.0 - 0.2 %    Comment: Performed at [place] [address], [place], [place] [address]  Glucose, capillary     Status: None   Collection Time: 07/14/18 10:59 AM  Result Value Ref Range   Glucose-Capillary 86 70 - 99 mg/dL  Glucose, capillary     Status: Abnormal   Collection Time: 07/14/18  2:09 PM  Result Value Ref Range   Glucose-Capillary 42 (LL) 70 - 99 mg/dL   Comment 1 Notify RN   Glucose, capillary     Status: None   Collection Time: 07/14/18  2:27 PM  Result Value Ref Range   Glucose-Capillary 71 70 - 99 mg/dL  Glucose, capillary     Status: None   Collection Time: 07/14/18  3:20 PM  Result Value Ref Range   Glucose-Capillary 91 70 - 99 mg/dL  Hemoglobin and  hematocrit, blood     Status: Abnormal   Collection Time: 07/14/18  3:27 PM  Result Value Ref Range   Hemoglobin 7.6 (L) 12.0 - 15.0 g/dL   HCT 22.6 (L) 36.0 - 46.0 %    Comment: Performed at [facility], [place] [address] [place], [place] [address]  Glucose, capillary     Status: Abnormal   Collection Time: 07/14/18  7:34 PM  Result Value Ref Range   Glucose-Capillary 154 (H) 70 - 99 mg/dL  Glucose, capillary     Status: Abnormal   Collection Time: 07/14/18 11:22 PM  Result Value Ref Range   Glucose-Capillary 176 (H) 70 - 99 mg/dL  Glucose, capillary     Status: None   Collection Time: 07/15/18  3:32 AM  Result Value Ref Range   Glucose-Capillary 95 70 - 99 mg/dL  Glucose, capillary     Status: Abnormal   Collection Time: 07/15/18  7:20 AM  Result Value Ref Range   Glucose-Capillary 52 (L) 70 - 99 mg/dL    Studies/Results: No results found.  Medications: I have reviewed the patient's current medications.  Assessment: Gastric ulcer with visible vessel and adherent clot Treated with epinephrine injection, bipolar cautery and Endo Clip placement  Stools yellow today, has not required further transfusions and remains hemodynamically stable  Plan: We will start solids, renal diet Okay to switch IV Protonix drip to Protonix 40 mg twice daily Okay to resume aspirin and Plavix in a.m. tomorrow if she continues to have no further melena and stable hemoglobin GI will sign off, please recall if needed Patient will need PPI twice daily for at least 8 weeks and then likely indefinitely once a day as long as she needs aspirin and Plavix.   Ronnette Juniper 07/15/2018, 10:51 AM   Pager [phone] If no answer or after 5 PM call [phone]

## 2018-07-15 NOTE — Progress Notes (Signed)
Inpatient Diabetes Program Recommendations  AACE/ADA: New Consensus Statement on Inpatient Glycemic Control (2015)  Target Ranges:  Prepandial:   less than 140 mg/dL      Peak postprandial:   less than 180 mg/dL (1-2 hours)      Critically ill patients:  140 - 180 mg/dL   Lab Results  Component Value Date   GLUCAP 95 07/15/2018   HGBA1C 11.0 (H) 06/30/2018    Review of Glycemic Control Results for Priscilla [name], Priscilla [name] (MRN [id number]) as of 07/15/2018 08:18  Ref. Range 07/14/2018 15:20 07/14/2018 19:34 07/14/2018 23:22 07/15/2018 03:32  Glucose-Capillary Latest Ref Range: 70 - 99 mg/dL 91 154 (H) 176 (H) 95   iabetes history:T1DM Outpatient Diabetes medications:Humalog SSI 1-10 units TID AC; NPH 20 units QHS Current orders for Inpatient glycemic control:Novolog Standard Correction Scale/SSI (2-6 units) Q4H, NPH 20 units QHS  Inpatient Diabetes Program Recommendations:  Noted hypoglycemic event on 3/17 of 42 mg/dL.  Consider decreasing Novolog to 1-3 units Q4H under the ICU order set.  Thanks, Bronson Curb, MSN, RNC-OB Diabetes Coordinator [phone] (8a-5p)

## 2018-07-15 NOTE — Progress Notes (Signed)
NAMEHaelyn [name], MRN:  [id number], DOB:  26-Jul-1970, LOS: 3 ADMISSION DATE:  07/12/2018, CONSULTATION DATE:  07/12/2018 REFERRING MD:  [name], CHIEF COMPLAINT:  Hematemesis   Brief History   48 yo [name] with complicated PMHx of ESRD on PD, S/P CABG presents for hematemesis and melena.   History of present illness   48 yo [name] with a past history of ESRD on peritoneal dialysis despite kidney transplant, as well as pancreatic transplant, mitral repair and CABG x2; presented this evening to [facility] for vomiting blood. While convalescing from an AV fistula repair, she did have some nausea post-procedure and did vomit once without blood.  The patient abruptly developed frank bloody emesis for two episodes, both seemingly of high volume. In retrospect the patient may have some dark stool heralding the bleed. There was no associated abdominal pain. There was not diarrhea. The patient did not have syncope. She is has a tenuous right  femoral fistula or which she has been on both ASA 325mg   and Plavix 75mg  daily. GI was consulted in the ED and subsequently was taken to the endoscopy suite. EGD revealed large clot burden at the cardia. The patient was transfused a unit of PRBC and planned for re-scope tomorrow for active source and potential control of site.   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  Significant Hospital Events   EGD 1u PRBC  Consults:  GI  Nephrology  Procedures:  EGD  Significant Diagnostic Tests:  EGD - Red blood and large clots in the cardia, in  the gastric fundus, in the gastric body, in the lesser curvature of the stomach and in the gastric antrum. No obvious active bleeding seen  Micro Data:    Antimicrobials:    Interim history/subjective:  Stools now becoming yellow in nature. Hemodynamically stable Transfer to stepdown unit for tried hospital service   Objective   Blood pressure (!) 96/40, pulse (!) 46, temperature 98.3 F (36.8 C), temperature source Oral, resp. rate 12, height 5' (1.524 m), weight 58.2 kg, SpO2 100 %.        Intake/Output Summary (Last 24 hours) at 07/15/2018 1113 Last data filed at 07/15/2018 1000 Gross per 24 hour  Intake 13725.38 ml  Output 12777 ml  Net 948.38 ml   Filed Weights   07/14/18 0858 07/14/18 1635 07/15/18 0630  Weight: 57.8 kg 59.7 kg 58.2 kg   Physical Exam:  General: Well-nourished well-developed female no acute distress HEENT: No JVD or lymphadenopathy is appreciated Neuro: Grossly intact CV: s1s2 rrr, no m/r/g hemodynamically stable PULM: even/non-labored, lungs bilaterally diminished in the bases UX:NATF, non-tender, bsx4 active, reports bowel pain when coughing Extremities: warm/dry, negative edema  Skin: no rashes or lesions   Resolved Hospital Problem list     Assessment & Plan:   Acute blood loss anemia secondary to UGIB Last Plavix and ASA taken on 3/15. S/p PRBC x 1, plt x 1 and albumin 5%  -3/16 EGD with epinephrine administration, cauterization and 2x endoclip placement -3/17: Hgb 7.1 from 8.9  Plan: GI is following continue to follow the recommendations Continue to hold antiplatelet therapy She will need PPI drip for approximately  24 more hours and then p.o. twice daily Further involvement with GI Advancing her diet per GI Transition to stepdown care unit and to [facility] hospitalist service as of 07/16/2018  Hemorrhagic Shock Recent Labs    07/14/18 1527 07/15/18 1009  HGB 7.6* 7.7*    Responsive to IVF and blood product resuscitation  Plan:  Transfuse per protocol  Maintain mean arterial pressure greater than 65 7  Hypokalemia Plan: Replete as needed Monitor BMP Being followed by nephrology  ICM/CABG MVR Last echo 2 years ago.  Plan: Continue to hold aspirin and Plavix in the setting of recent GI bleed Continue Cozaar Lipitor  ESRD S/p kidney and pancreatic transplant in 2012 Plan: Peritoneal dialysis per nephrology  DM/post pancrease transplant  Plan: Currently on CBGs every 4 hours NPH nightly and with sliding scale insulin Creon    Best practice:  Diet: Advance as tolerated Pain/Anxiety/Delirium protocol (if indicated): None indicated VAP protocol (if indicated): None indicated DVT prophylaxis: SCD GI prophylaxis: PPI gtt  Glucose control: CBG q4 , qHS NPH + SSI  Mobility: as tolerated Code Status: FC Family Communication: 07/15/1998 patient updated on Disposition: ICU  Labs   CBC: Recent Labs  Lab 07/12/18 1558  07/12/18 2317 07/13/18 0616 07/13/18 1612 07/14/18 1026 07/14/18 1527 07/15/18 1009  WBC 12.4*  --  19.8* 16.0* 20.3* 13.9*  --   --   NEUTROABS 9.7*  --   --   --   --   --   --   --   HGB 10.3*   < > 9.4* 6.5* 8.9* 7.1* 7.6* 7.7*  HCT 32.8*   < > 28.0* 19.0* 27.0* 20.6* 22.6* 23.5*  MCV 83.0  --  83.1 82.3 88.5 85.8  --   --   PLT 355  --  257 315 274 225  --   --    < > = values in this interval not displayed.    Basic Metabolic Panel: Recent Labs  Lab 07/12/18 1558 07/13/18 0616 07/14/18 1026  NA 134* 138 138  K 3.0* 3.2* 3.1*  CL 94* 105 104  CO2 27 21* 22  GLUCOSE 335* 136* 85  BUN 32* 43* 41*  CREATININE 9.76* 9.39* 8.58*  CALCIUM 8.1* 7.3* 7.6*   GFR: Estimated Creatinine Clearance: 6.5 mL/min (A) (by C-G formula based on SCr of 8.58 mg/dL (H)). Recent Labs  Lab 07/12/18 2317 07/13/18 0616 07/13/18 1612 07/14/18 1026  WBC 19.8* 16.0* 20.3* 13.9*    Liver Function Tests: Recent Labs  Lab 07/12/18 1558 07/13/18 0616  AST 22 25  ALT 5 8   ALKPHOS 228* 123  BILITOT 0.5 0.6  PROT 5.9* 4.5*  ALBUMIN 1.7* 1.9*   Recent Labs  Lab 07/12/18 1558  LIPASE 54*    Coagulation Profile: Recent Labs  Lab 07/12/18 1558  INR 1.3*    Cardiac Enzymes: No results for input(s): CKTOTAL, CKMB, CKMBINDEX, TROPONINI in the last 168 hours.  HbA1C: Hgb A1c MFr Bld  Date/Time Value Ref Range Status  06/30/2018 03:20 PM 11.0 (H) 4.8 - 5.6 % Final    Comment:    (NOTE) Pre diabetes:          5.7%-6.4% Diabetes:              >6.4% Glycemic control for   <7.0% adults with diabetes   05/14/2016 07:07 PM 15.0 (H) 4.8 - 5.6 % Final    Comment:    (NOTE)  Pre-diabetes: 5.7 - 6.4         Diabetes: >6.4         Glycemic control for adults with diabetes: <7.0     CBG: Recent Labs  Lab 07/14/18 1934 07/14/18 2322 07/15/18 0332 07/15/18 0720 07/15/18 1055  GLUCAP 154* 176* 95 52* 71       Steve Maitri Schnoebelen ACNP Maryanna Shape PCCM Pager [phone] till 1 pm If no answer page [phone] 07/15/2018, 11:13 AM

## 2018-07-16 DIAGNOSIS — E162 Hypoglycemia, unspecified: Secondary | ICD-10-CM

## 2018-07-16 LAB — BASIC METABOLIC PANEL
Anion gap: 9 (ref 5–15)
BUN: 30 mg/dL — ABNORMAL HIGH (ref 6–20)
CHLORIDE: 104 mmol/L (ref 98–111)
CO2: 24 mmol/L (ref 22–32)
CREATININE: 8.05 mg/dL — AB (ref 0.44–1.00)
Calcium: 7.4 mg/dL — ABNORMAL LOW (ref 8.9–10.3)
GFR calc Af Amer: 6 mL/min — ABNORMAL LOW (ref >60)
GFR calc non Af Amer: 5 mL/min — ABNORMAL LOW (ref >60)
Glucose, Bld: 70 mg/dL (ref 70–99)
Potassium: 2.9 mmol/L — ABNORMAL LOW (ref 3.5–5.1)
Sodium: 137 mmol/L (ref 135–145)

## 2018-07-16 LAB — BPAM RBC
Blood Product Expiration Date: 202004162359
Blood Product Expiration Date: 202004162359
Blood Product Expiration Date: 202004162359
Blood Product Expiration Date: 202004172359
ISSUE DATE / TIME: 202003141235
ISSUE DATE / TIME: 202003151959
ISSUE DATE / TIME: 202003160810
Unit Type and Rh: 5100
Unit Type and Rh: 5100
Unit Type and Rh: 5100
Unit Type and Rh: 5100

## 2018-07-16 LAB — CBC WITH DIFFERENTIAL/PLATELET
Abs Immature Granulocytes: 0.25 10*3/uL — ABNORMAL HIGH (ref 0.00–0.07)
Basophils Absolute: 0 10*3/uL (ref 0.0–0.1)
Basophils Relative: 0 %
Eosinophils Absolute: 0.7 10*3/uL — ABNORMAL HIGH (ref 0.0–0.5)
Eosinophils Relative: 5 %
HCT: 24 % — ABNORMAL LOW (ref 36.0–46.0)
Hemoglobin: 7.8 g/dL — ABNORMAL LOW (ref 12.0–15.0)
Immature Granulocytes: 2 %
LYMPHS PCT: 8 %
Lymphs Abs: 1.3 10*3/uL (ref 0.7–4.0)
MCH: 28.5 pg (ref 26.0–34.0)
MCHC: 32.5 g/dL (ref 30.0–36.0)
MCV: 87.6 fL (ref 80.0–100.0)
Monocytes Absolute: 1.2 10*3/uL — ABNORMAL HIGH (ref 0.1–1.0)
Monocytes Relative: 8 %
NEUTROS ABS: 11.5 10*3/uL — AB (ref 1.7–7.7)
Neutrophils Relative %: 77 %
Platelets: 242 10*3/uL (ref 150–400)
RBC: 2.74 MIL/uL — ABNORMAL LOW (ref 3.87–5.11)
RDW: 15.8 % — ABNORMAL HIGH (ref 11.5–15.5)
WBC: 14.9 10*3/uL — ABNORMAL HIGH (ref 4.0–10.5)
nRBC: 0.1 % (ref 0.0–0.2)

## 2018-07-16 LAB — TYPE AND SCREEN
ABO/RH(D): O POS
Antibody Screen: NEGATIVE
UNIT DIVISION: 0
Unit division: 0
Unit division: 0
Unit division: 0

## 2018-07-16 LAB — GLUCOSE, CAPILLARY
Glucose-Capillary: 193 mg/dL — ABNORMAL HIGH (ref 70–99)
Glucose-Capillary: 147 mg/dL — ABNORMAL HIGH (ref 70–99)
Glucose-Capillary: 60 mg/dL — ABNORMAL LOW (ref 70–99)
Glucose-Capillary: 43 mg/dL — CL (ref 70–99)
Glucose-Capillary: 43 mg/dL — CL (ref 70–99)
Glucose-Capillary: 56 mg/dL — ABNORMAL LOW (ref 70–99)
Glucose-Capillary: 146 mg/dL — ABNORMAL HIGH (ref 70–99)
Glucose-Capillary: 126 mg/dL — ABNORMAL HIGH (ref 70–99)
Glucose-Capillary: 227 mg/dL — ABNORMAL HIGH (ref 70–99)

## 2018-07-16 LAB — MAGNESIUM: Magnesium: 1.1 mg/dL — ABNORMAL LOW (ref 1.7–2.4)

## 2018-07-16 LAB — GLUCOSE, RANDOM: Glucose, Bld: 57 mg/dL — ABNORMAL LOW (ref 70–99)

## 2018-07-16 LAB — PHOSPHORUS: Phosphorus: 3.3 mg/dL (ref 2.5–4.6)

## 2018-07-16 MED ORDER — NEPRO/CARBSTEADY PO LIQD
237.0000 mL | Freq: Three times a day (TID) | ORAL | Status: DC
  Administered 2018-07-16 – 2018-07-24 (×3): 237 mL via ORAL
  Filled 2018-07-16 (×7): qty 237

## 2018-07-16 MED ORDER — MAGNESIUM SULFATE 2 GM/50ML IV SOLN
2.0000 g | Freq: Once | INTRAVENOUS | Status: AC
  Administered 2018-07-16: 2 g via INTRAVENOUS
  Filled 2018-07-16: qty 50

## 2018-07-16 MED ORDER — DEXTROSE 50 % IV SOLN
INTRAVENOUS | Status: AC
  Administered 2018-07-16: 12.5 g
  Filled 2018-07-16: qty 50

## 2018-07-16 MED ORDER — GLUCOSE 40 % PO GEL
2.0000 | ORAL | Status: AC
  Administered 2018-07-16: 75 g via ORAL

## 2018-07-16 MED ORDER — INSULIN ASPART 100 UNIT/ML ~~LOC~~ SOLN
1.0000 [IU] | SUBCUTANEOUS | Status: DC
  Administered 2018-07-16: 2 [IU] via SUBCUTANEOUS
  Administered 2018-07-17: 3 [IU] via SUBCUTANEOUS
  Administered 2018-07-17: 1 [IU] via SUBCUTANEOUS
  Administered 2018-07-18: 2 [IU] via SUBCUTANEOUS

## 2018-07-16 MED ORDER — GLUCOSE 40 % PO GEL
ORAL | Status: AC
  Filled 2018-07-16: qty 1

## 2018-07-16 MED ORDER — CLOPIDOGREL BISULFATE 75 MG PO TABS
75.0000 mg | ORAL_TABLET | Freq: Every day | ORAL | Status: DC
  Administered 2018-07-16 – 2018-07-17 (×2): 75 mg via ORAL
  Filled 2018-07-16 (×2): qty 1

## 2018-07-16 MED ORDER — HEPARIN 1000 UNIT/ML FOR PERITONEAL DIALYSIS: 500.0000 [IU] | INTRAMUSCULAR | Status: DC | PRN

## 2018-07-16 MED ORDER — GENTAMICIN SULFATE 0.1 % EX CREA
1.0000 "application " | TOPICAL_CREAM | Freq: Every day | CUTANEOUS | Status: DC
  Administered 2018-07-16 – 2018-07-17 (×3): 1 via TOPICAL
  Filled 2018-07-16: qty 15

## 2018-07-16 MED ORDER — DELFLEX-LC/2.5% DEXTROSE 394 MOSM/L IP SOLN: INTRAPERITONEAL | Status: DC

## 2018-07-16 MED ORDER — INSULIN NPH (HUMAN) (ISOPHANE) 100 UNIT/ML ~~LOC~~ SUSP
16.0000 [IU] | Freq: Every evening | SUBCUTANEOUS | Status: DC
  Administered 2018-07-17: 16 [IU] via SUBCUTANEOUS
  Filled 2018-07-16: qty 10

## 2018-07-16 MED ORDER — ASPIRIN EC 81 MG PO TBEC
81.0000 mg | DELAYED_RELEASE_TABLET | Freq: Every day | ORAL | Status: DC
  Administered 2018-07-16 – 2018-07-17 (×2): 81 mg via ORAL
  Filled 2018-07-16 (×3): qty 1

## 2018-07-16 MED ORDER — HEPARIN 1000 UNIT/ML FOR PERITONEAL DIALYSIS
INTRAPERITONEAL | Status: DC | PRN
  Filled 2018-07-16: qty 5

## 2018-07-16 MED ORDER — HEPARIN 1000 UNIT/ML FOR PERITONEAL DIALYSIS
INTRAPERITONEAL | Status: DC | PRN
  Filled 2018-07-16: qty 5000

## 2018-07-16 MED ORDER — DELFLEX-LC/4.25% DEXTROSE 483 MOSM/L IP SOLN: INTRAPERITONEAL | Status: DC

## 2018-07-16 MED ORDER — POTASSIUM CHLORIDE CRYS ER 20 MEQ PO TBCR
30.0000 meq | EXTENDED_RELEASE_TABLET | Freq: Three times a day (TID) | ORAL | Status: AC
  Administered 2018-07-16 – 2018-07-17 (×4): 30 meq via ORAL
  Filled 2018-07-16 (×4): qty 1

## 2018-07-16 NOTE — Progress Notes (Signed)
Initial Nutrition Assessment  DOCUMENTATION CODES:   Non-severe (moderate) malnutrition in context of acute illness/injury(likely component of chronic malnutrition as well)  INTERVENTION:   Liberalize diet to REGULAR  Snacks TID between meals  Nepro Shake po TID prn between meals, each supplement provides 425 kcal and 19 grams protein  Continue Rena-Vit  Consider increasing Creon dose as pt has not experienced any improvement in diarrhea post initiation of Creon 1 month ago  Discussed blood sugar management with diabetes coordinator; complex situation given brittle DM, pancreatic insuffiency and ESRD on PD (dialysate provides dextrose)   NUTRITION DIAGNOSIS:   Moderate Malnutrition related to acute illness(acute blood loss anemia with UGIB and associated GI symptoms) as evidenced by energy intake < or equal to 50% for > or equal to 5 days, mild fat depletion, mild muscle depletion.  GOAL:   Patient will meet greater than or equal to 90% of their needs  MONITOR:   PO intake, Supplement acceptance, Labs, Weight trends  REASON FOR ASSESSMENT:   Rounds    ASSESSMENT:   48 yo female admitted with acute blood loss anemia secondary to UGIB.  PMH includes ESRD on PD with hx of kidney and  pancreatic transplant, mitral repair and CABG x 2, brittle DM  Pt reports poor appetite; ate 1/2 bowl of oatmeal and 2 juice this AM. Pt reports she did not eat anything yesterday due to significant nausea; ate some liquids the day before. Prior to this pt had not eaten anything since Sunday. Prior to this pt indicates poor appetite and po intake with early satiety x 1 week. Pt reports being full after 5-6 bites and not able to eat anything else. PO intake <50% for >/= 5 days. At baseline, pt eats 3 meals per day; does not snack due to medication burden;  as she reports she does not want to have to take insulin, creon and phoslo more times than she has to. Pt reports she does not take ONS currently  but reports RD recently sent her a protein shake which she has not tried.  Pt currently on renal diet with fluid restriction. Pt does not require potassium or phosphorus restriction at this time given hypokalemia and phosphorus below reference range for HD and poor po intake. Pt also does not require strict fluid restriction given pt receives PD daily and with chronic diarrhea on creon. Pt reports she does not follow fluid restriction at home and does not have issues with high IDWG.   EDW 54 kg; current wt 59.5 kg. Pt noted to have mild generalized edema. Noted weight up after PD? Question accuracy of weights. Noted admission wt 53.5 kg. Pt believes she has lost some weight recently but unsure  Multiple Hypoglycemic episodes; CBGs 43-193. Discussed concerns with diabetes coordinator. It appears that the blood sugar spikes requiring insulin are happening overnight which is while pt is receiving PD (dialysate for PD contains dextrose).  Pt receiving minimal insulin and sugars bottoming out after disconnection from PD.   Current PD prescription: 6 fills, 2 Liter volume/fill, 2/3 4.25%, 1/3 2.5%; provides estimated 600-756 kcals/day via dextrose (estimated 176 g-222 g of CHO)  Phosphorus 3.3, phosphorus binder currently not. Recommend continuing to hold for now; po intake poor, phosphorus <3.5  Labs: potassium 2.9 (L), phosphorus 3.3 (considered low for HD), magnesium 1.1 (L) Meds: Rena-Vit, Creon 108,000 units with meals, 72,000 units with snacks, 20 units insulin NPH human at bedtime ss novolog q 4 hours  NUTRITION - FOCUSED PHYSICAL  EXAM:    Most Recent Value  Orbital Region  Mild depletion  Upper Arm Region  Mild depletion  Thoracic and Lumbar Region  Mild depletion  Buccal Region  No depletion  Temple Region  Mild depletion  Clavicle Bone Region  Mild depletion  Clavicle and Acromion Bone Region  Mild depletion  Scapular Bone Region  Mild depletion  Dorsal Hand  No depletion  Patellar  Region  Mild depletion  Anterior Thigh Region  Moderate depletion  Posterior Calf Region  Moderate depletion  Edema (RD Assessment)  Mild  Hair  Reviewed  Eyes  Reviewed  Mouth  Reviewed  Skin  Reviewed  Nails  Reviewed       Diet Order:  RENAL with 1200 mL Fluid restriction  EDUCATION NEEDS:   Education needs have been addressed  Skin:  Skin Assessment: Skin Integrity Issues: Skin Integrity Issues:: Diabetic Ulcer Diabetic Ulcer: foot  Last BM:  3/19  Height:   Ht Readings from Last 1 Encounters:  07/12/18 5' (1.524 m)    Weight:   Wt Readings from Last 1 Encounters:  07/16/18 59.5 kg    BMI:  Body mass index is 25.62 kg/m.  Estimated Nutritional Needs:   Kcal:  1950-2160 kcals   Protein:  98-108 g   Fluid:  1.9-2.1 L   Kerman Passey MS, RD, LDN, CNSC [phone] Pager  [phone] Weekend/On-Call Pager

## 2018-07-16 NOTE — Plan of Care (Signed)
Patient continues with diarrhea at this time. PRN medication in place. PD continues. A&O x 4. Verbalizes understanding relative to all education measures. Denies further need / question / concern at this time.

## 2018-07-16 NOTE — Progress Notes (Addendum)
Inpatient Diabetes Program Recommendations  AACE/ADA: New Consensus Statement on Inpatient Glycemic Control (2015)  Target Ranges:  Prepandial:   less than 140 mg/dL      Peak postprandial:   less than 180 mg/dL (1-2 hours)      Critically ill patients:  140 - 180 mg/dL   Lab Results  Component Value Date   GLUCAP 43 (LL) 07/16/2018   HGBA1C 11.0 (H) 06/30/2018    Review of Glycemic Control Results for SYD, MANGES (MRN [id number]) as of 07/16/2018 11:45  Ref. Range 07/16/2018 03:15 07/16/2018 09:17 07/16/2018 10:15 07/16/2018 11:19  Glucose-Capillary Latest Ref Range: 70 - 99 mg/dL 147 (H) 60 (L) 43 (LL) 43 (LL)   Diabetes history:T1DM Outpatient Diabetes medications:Humalog SSI 1-10 units TID AC; NPH 20 units QHS Current orders for Inpatient glycemic control:Novolog 0-9 units Q4H, NPH 20 units QHS  Inpatient Diabetes Program Recommendations:  Noted hypoglycemia of 43 mg/dL and interventions associated from hypoglycemia protocol.   Consider decreasing NPH to 16 units QHS. Additionally, in the setting of multiple episode of hypoglycemia, consider Novolog custom scale Q4H.  Novolog Custom Correction Scale CBG <70 - implement hypoglycemia protocol   70-120- 0 121-150 - 0 151-200 - 1 201-250 - 2 251-300 - 3 301-350 - 4 351-400 - 5 >400- call MD   @1200  Spoke with RN regarding po intake and hypoglycemia protocol. RN initiated and plan for glucose serum. Informed about possible order changes.  Will continue to follow.    Thanks, Bronson Curb, MSN, RNC-OB Diabetes Coordinator [phone] (8a-5p)

## 2018-07-16 NOTE — Progress Notes (Addendum)
Inpatient Diabetes Program Recommendations  AACE/ADA: New Consensus Statement on Inpatient Glycemic Control (2015)  Target Ranges:  Prepandial:   less than 140 mg/dL      Peak postprandial:   less than 180 mg/dL (1-2 hours)      Critically ill patients:  140 - 180 mg/dL   Lab Results  Component Value Date   GLUCAP 56 (L) 07/16/2018   HGBA1C 11.0 (H) 06/30/2018    Review of Glycemic Control  Results for ADIANA, SMELCER (MRN [id number]) as of 07/16/2018 14:52  Ref. Range 07/16/2018 09:17 07/16/2018 10:15 07/16/2018 11:19 07/16/2018 12:27  Glucose-Capillary Latest Ref Range: 70 - 99 mg/dL 60 (L) 43 (LL) 43 (LL) 56 (L)   Diabetes history:T1DM Outpatient Diabetes medications:Humalog SSI 1-10 units TID AC; NPH 20 units QHS Current orders for Inpatient glycemic control:Novolog 0-9 units Q4H, NPH 20 units QHS  Inpatient Diabetes Program Recommendations:     Spoke w/ Dr. Loanne Drilling and pharmacy regarding recommendations given patient's hypoglycemia this AM and insulin dosing schedule. Per patient, at home she usually starts PD around 2000 and takes NPH around the same time. She reports elevated fasting CBGs up to 400 mg/dL as  A FSBS at home. Curious if peak time is contributing to rebound hyperglycemia? While inpatient, she has been starting PD around 1800 and receiving NPH around 2200. Question differences in home vs inpatient PD and insulin dosing schedules combined w/ nighttime Q4H correction (which she does not take at home) may be contributing to AM hypoglycemia.  Dr. Alroy Bailiff to decrease NPH to 16 units daily at 2100, change Novolog to custom scale w/ coverage starting at 150 mg/dL, and patient to start PD around 2000 to mirror her home schedule.  Also received approval for [facility] placement from Dr. Loanne Drilling. Patient educated on meter use and reader placement. Patient successfully demonstrated reader placement on L upper arm. Encouraged patient to check CBG frequently  and that nursing staff will continue to check fingerstick CBG and dose SSI based on those readings as they have been doing, and that readings will vary between [employer] and our hospital glucometers. Patient verbalized understanding. Bedside RN also notified of [employer] Libre reader placement. Patient encouraged to take this device along with readings to endocrinologist at next scheduled visit.  Will continue to follow.   Thanks, Dorrene German, RN, BSN [employer] MSN Student

## 2018-07-16 NOTE — Progress Notes (Signed)
NAMEKandra [name], MRN:  [id number], DOB:  [birth year], LOS: 4 ADMISSION DATE:  07/12/2018, CONSULTATION DATE:  07/12/2018 REFERRING MD:  [name], CHIEF COMPLAINT:  Hematemesis   Brief History   49 yo [name] with complicated PMHx of ESRD on PD, S/P CABG presents for hematemesis and melena.   History of present illness   48 yo [name] with a past history of ESRD on peritoneal dialysis despite kidney transplant, as well as pancreatic transplant, mitral repair and CABG x2; presented this evening to [facility] for vomiting blood. While convalescing from an AV fistula repair, she did have some nausea post-procedure and did vomit once without blood.  The patient abruptly developed frank bloody emesis for two episodes, both seemingly of high volume. In retrospect the patient may have some dark stool heralding the bleed. There was no associated abdominal pain. There was not diarrhea. The patient did not have syncope. She is has a tenuous right  femoral fistula or which she has been on both ASA 325mg   and Plavix 75mg  daily. GI was consulted in the ED and subsequently was taken to the endoscopy suite. EGD revealed large clot burden at the cardia. The patient was transfused a unit of PRBC and planned for re-scope tomorrow for active source and potential control of site.   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  Significant Hospital Events   EGD 1u PRBC  Consults:  GI  Nephrology  Procedures:  EGD  Significant Diagnostic Tests:  EGD - Red blood and large clots in the cardia, in  the gastric fundus, in the gastric body, in the lesser curvature of the stomach and in the gastric antrum. No obvious active bleeding seen  Micro Data:    Antimicrobials:    Interim history/subjective:  Continues to improve ready for transfer to stepdown unit when bed available  Objective   Blood pressure (!) 125/38, pulse 82, temperature 98.7 F (37.1 C), temperature source Oral, resp. rate 16, height 5' (1.524 m), weight 58.5 kg, SpO2 100 %.        Intake/Output Summary (Last 24 hours) at 07/16/2018 0858 Last data filed at 07/15/2018 1845 Gross per 24 hour  Intake 46.26 ml  Output 2 ml  Net 44.26 ml   Filed Weights   07/15/18 0630 07/15/18 1754 07/16/18 0500  Weight: 58.2 kg 58.7 kg 58.5 kg   Physical Exam:  General: Well-nourished well-developed female no acute distress  HEENT: No JVD or lymphadenopathy is appreciated Neuro: Grossly intact CV: s1s2 rrr, no m/r/g PULM: even/non-labored, lungs bilaterally air FB:PZWC, non-tender, bsx4 active, currently receiving peritoneal dialysis Extremities: warm/dry, negative edema  Skin: no rashes or lesions    Resolved Hospital Problem list     Assessment & Plan:   Acute blood loss anemia secondary to UGIB Last Plavix and ASA taken on 3/15. S/p PRBC x 1, plt x 1 and albumin 5%  -3/16 EGD with epinephrine administration, cauterization and 2x endoclip placement -3/17: Hgb 7.1 from 8.9  Plan: GI is following Continue to hold antiplatelet therapy Change PPI to IV every 12 Advance diet Transition to stepdown care and return to internal medicine teaching service as of 07/17/2018  Hemorrhagic Shock Recent Labs    07/14/18 1527 07/15/18 1009  HGB 7.6* 7.7*    Responsive to IVF and blood product resuscitation  Plan: Transfuse per protocol  Hypokalemia Plan: Per nephrology  ICM/CABG MVR Last echo 2 years ago.  Plan: Continue to hold aspirin and Plavix in setting of recent GI bleed Timing of resumption of Plavix  and aspirin per GI Continue Cozaar Lipitor  ESRD S/p kidney and pancreatic transplant in 2012 Plan: 07/16/2018 currently undergoing peritoneal dialysis  DM/post pancrease transplant  CBG (last 3)  Recent Labs    07/15/18 2023 07/15/18 2312 07/16/18 0315  GLUCAP 121* 193* 147*    Plan: Sliding scale insulin protocol NPH insulin New CBGs    Best practice:  Diet: Advance as tolerated Pain/Anxiety/Delirium protocol (if indicated): None indicated VAP protocol (if indicated): None indicated DVT prophylaxis: SCD GI prophylaxis: PPI gtt  Glucose control: CBG q4 , qHS NPH + SSI  Mobility: as tolerated Code Status: FC Family Communication: 07/16/2018 patient updated at bedside Disposition: ICU  Labs   CBC: Recent Labs  Lab 07/12/18 1558  07/12/18 2317 07/13/18 0616 07/13/18 1612 07/14/18 1026 07/14/18 1527 07/15/18 1009  WBC 12.4*  --  19.8* 16.0* 20.3* 13.9*  --   --   NEUTROABS 9.7*  --   --   --   --   --   --   --   HGB 10.3*   < > 9.4* 6.5* 8.9* 7.1* 7.6* 7.7*  HCT 32.8*   < > 28.0* 19.0* 27.0* 20.6* 22.6* 23.5*  MCV 83.0  --  83.1 82.3 88.5 85.8  --   --   PLT 355  --  257 315 274 225  --   --    < > = values in this interval not displayed.    Basic Metabolic Panel: Recent Labs  Lab 07/12/18 1558 07/13/18 0616 07/14/18 1026  NA 134* 138 138  K 3.0* 3.2* 3.1*  CL 94* 105 104  CO2 27 21* 22  GLUCOSE 335* 136* 85  BUN 32* 43* 41*  CREATININE 9.76* 9.39* 8.58*  CALCIUM 8.1* 7.3* 7.6*   GFR: Estimated Creatinine Clearance: 6.6 mL/min (A) (by C-G formula based on SCr of 8.58 mg/dL (H)). Recent Labs  Lab 07/12/18 2317 07/13/18 0616 07/13/18 1612 07/14/18 1026  WBC 19.8* 16.0* 20.3* 13.9*    Liver Function Tests: Recent Labs  Lab 07/12/18 1558 07/13/18 0616  AST 22 25  ALT 5 8  ALKPHOS 228* 123  BILITOT 0.5 0.6  PROT 5.9* 4.5*  ALBUMIN 1.7* 1.9*   Recent Labs  Lab 07/12/18 1558  LIPASE 54*    Coagulation Profile: Recent Labs   Lab 07/12/18 1558  INR 1.3*    Cardiac Enzymes: No results for input(s): CKTOTAL, CKMB, CKMBINDEX, TROPONINI in the last 168 hours.  HbA1C: Hgb A1c MFr Bld  Date/Time Value Ref Range Status  06/30/2018 03:20 PM 11.0 (H) 4.8 - 5.6 % Final    Comment:    (NOTE) Pre diabetes:          5.7%-6.4% Diabetes:              >6.4% Glycemic control for   <7.0% adults with diabetes   05/14/2016 07:07 PM 15.0 (H) 4.8 - 5.6 % Final    Comment:    (NOTE)         Pre-diabetes: 5.7 - 6.4         Diabetes: >6.4  Glycemic control for adults with diabetes: <7.0     CBG: Recent Labs  Lab 07/15/18 1055 07/15/18 1507 07/15/18 2023 07/15/18 2312 07/16/18 0315  GLUCAP 71 60*  60* 121* 193* 147*       Steve Arbie Reisz ACNP Maryanna Shape PCCM Pager [phone] till 1 pm If no answer page [phone] 07/16/2018, 8:58 AM

## 2018-07-16 NOTE — Progress Notes (Signed)
Blood glucose level 60. Patient asymptomatic. Denies fatigue, dizziness, nausea or emesis. 240 ml orange juice given. Will reassess blood glucose level in approximately 30 minutes.

## 2018-07-16 NOTE — Progress Notes (Signed)
[facility] Progress Note  Subjective: seen in room, no distress. Lightheaded on standing. Less swelling in LE's today.   Vitals:   07/16/18 0920 07/16/18 1000 07/16/18 1040 07/16/18 1100  BP:  (!) 90/43 (!) 112/94 (!) 99/54  Pulse:  88 90 87  Resp:  18 17 17   Temp: 98.5 F (36.9 C)  98.2 F (36.8 C)   TempSrc: Oral  Oral   SpO2:  100% 100% 100%  Weight:   59.3 kg 59.5 kg  Height:        Inpatient medications: . sodium chloride   Intravenous Once  . amitriptyline  10 mg Oral QHS  . atorvastatin  10 mg Oral QHS  . gabapentin  100 mg Oral TID  . gentamicin cream  1 application Topical Daily  . insulin aspart  0-9 Units Subcutaneous Q4H  . insulin NPH Human  20 Units Subcutaneous QHS  . latanoprost  1 drop Both Eyes QHS  . lipase/protease/amylase  108,000 Units Oral TID WC  . lipase/protease/amylase  72,000 Units Oral With snacks  . multivitamin  1 tablet Oral QHS  . pantoprazole (PROTONIX) IV  40 mg Intravenous Q12H  . potassium chloride  20 mEq Oral Daily   . sodium chloride Stopped (07/13/18 1109)  . dialysis solution 2.5% low-MG/low-CA    . dialysis solution 4.25% low-MG/low-CA     sodium chloride, acetaminophen, dianeal solution for CAPD/CCPD with heparin, dianeal solution for CAPD/CCPD with heparin, loperamide, promethazine  Iron/TIBC/Ferritin/ %Sat    Component Value Date/Time   IRON 21 (L) 09/25/2012 0525   TIBC 274 09/25/2012 0525   FERRITIN 28 09/25/2012 0525   IRONPCTSAT 8 (L) 09/25/2012 0525    Exam: Gen alert, no distress No jvd or bruits Chest clear bilat RRR no MRG Abd soft ntnd no mass or ascites +bs  LUQ PD cath clean exit site Ext no LE edema Neuro is alert, Ox 3 , nf      Home meds:  - losartan 50 hs/ carvedilol 6.25 bid/ clonidine 0.1 mg hs/ kdur 20 qd  - calc acetate 4 ac tid/ creon tid ac  - insulin nph 20u qd/ insulin lispro 1-10 ssi tid  - gabapentin 100 tid/ amitriptyline 10 hs/ oxycodone- acetaminophen 1 qid  prn  - atorvastatin 10 hs/ clopidogrel 75/ ecasa 81 qd/      PD [facility] EDW 54 kg 6 total exchanges- 5 on the cycler 2 L fill vol 1 hr 30 min dwell time and 1 pause of 1500 mL, 4 hrs, dry day.     Assessment/ Plan: 1. Upper GIB - EGD x2, on 3/16 had Rx of gastric ulcers w/ stigmata of bleeding x2.  Bleeding appears to be abating.  2. Anemia abl/ ckd - sp 2up prbc's on 3/15. Hb slow decline. Was not transfused yesterday due to low supply. Hb 7.8 this am.   3. Hypotension - not sure cause, vol up, not septic appearing. Losartan dc'd yest 4. Volume - +jvd/ edema on exam, mild. Up 4-5kg by wt.  Cont Ardyth Harps PD fluids for vol removal.  5. ESRD on CCPD - cont PD qpm 6. CAD hx CABG/ MV repair 7. HTN - bp's soft, dc losartan.  8. HL 9. DM2 on insulin 10. Hypomagnesemia - IV MgSO4 2 gm       [facility] 07/16/2018, 11:53 AM  Recent Labs  Lab 07/12/18 1558 07/13/18 0616 07/14/18 1026 07/16/18 0858  NA 134* 138 138 137  K 3.0*  3.2* 3.1* 2.9*  CL 94* 105 104 104  CO2 27 21* 22 24  GLUCOSE 335* 136* 85 70  BUN 32* 43* 41* 30*  CREATININE 9.76* 9.39* 8.58* 8.05*  CALCIUM 8.1* 7.3* 7.6* 7.4*  PHOS  --   --   --  3.3  ALBUMIN 1.7* 1.9*  --   --   INR 1.3*  --   --   --    Recent Labs  Lab 07/12/18 1558 07/13/18 0616  AST 22 25  ALT 5 8  ALKPHOS 228* 123  BILITOT 0.5 0.6  PROT 5.9* 4.5*   Recent Labs  Lab 07/12/18 1558  07/14/18 1026  07/15/18 1009 07/16/18 0858  WBC 12.4*   < > 13.9*  --   --  14.9*  NEUTROABS 9.7*  --   --   --   --  11.5*  HGB 10.3*   < > 7.1*   < > 7.7* 7.8*  HCT 32.8*   < > 20.6*   < > 23.5* 24.0*  MCV 83.0   < > 85.8  --   --  87.6  PLT 355   < > 225  --   --  242   < > = values in this interval not displayed.

## 2018-07-17 DIAGNOSIS — I2510 Atherosclerotic heart disease of native coronary artery without angina pectoris: Secondary | ICD-10-CM

## 2018-07-17 DIAGNOSIS — Z79899 Other long term (current) drug therapy: Secondary | ICD-10-CM

## 2018-07-17 DIAGNOSIS — E10649 Type 1 diabetes mellitus with hypoglycemia without coma: Secondary | ICD-10-CM

## 2018-07-17 DIAGNOSIS — K529 Noninfective gastroenteritis and colitis, unspecified: Secondary | ICD-10-CM

## 2018-07-17 DIAGNOSIS — I120 Hypertensive chronic kidney disease with stage 5 chronic kidney disease or end stage renal disease: Secondary | ICD-10-CM

## 2018-07-17 DIAGNOSIS — Z992 Dependence on renal dialysis: Secondary | ICD-10-CM

## 2018-07-17 DIAGNOSIS — Z7902 Long term (current) use of antithrombotics/antiplatelets: Secondary | ICD-10-CM

## 2018-07-17 DIAGNOSIS — E1022 Type 1 diabetes mellitus with diabetic chronic kidney disease: Secondary | ICD-10-CM

## 2018-07-17 DIAGNOSIS — Z951 Presence of aortocoronary bypass graft: Secondary | ICD-10-CM

## 2018-07-17 DIAGNOSIS — E1051 Type 1 diabetes mellitus with diabetic peripheral angiopathy without gangrene: Secondary | ICD-10-CM

## 2018-07-17 DIAGNOSIS — E440 Moderate protein-calorie malnutrition: Secondary | ICD-10-CM | POA: Diagnosis present

## 2018-07-17 DIAGNOSIS — Z7982 Long term (current) use of aspirin: Secondary | ICD-10-CM

## 2018-07-17 DIAGNOSIS — T86890 Other transplanted tissue rejection: Secondary | ICD-10-CM

## 2018-07-17 DIAGNOSIS — E8770 Fluid overload, unspecified: Secondary | ICD-10-CM

## 2018-07-17 DIAGNOSIS — Z95820 Peripheral vascular angioplasty status with implants and grafts: Secondary | ICD-10-CM

## 2018-07-17 DIAGNOSIS — T8611 Kidney transplant rejection: Secondary | ICD-10-CM

## 2018-07-17 DIAGNOSIS — R011 Cardiac murmur, unspecified: Secondary | ICD-10-CM

## 2018-07-17 DIAGNOSIS — Z8679 Personal history of other diseases of the circulatory system: Secondary | ICD-10-CM

## 2018-07-17 DIAGNOSIS — Z9889 Other specified postprocedural states: Secondary | ICD-10-CM

## 2018-07-17 DIAGNOSIS — K8689 Other specified diseases of pancreas: Secondary | ICD-10-CM

## 2018-07-17 LAB — PHOSPHORUS: Phosphorus: 3.1 mg/dL (ref 2.5–4.6)

## 2018-07-17 LAB — MAGNESIUM: Magnesium: 1.6 mg/dL — ABNORMAL LOW (ref 1.7–2.4)

## 2018-07-17 LAB — CBC
HCT: 23.5 % — ABNORMAL LOW (ref 36.0–46.0)
Hemoglobin: 8 g/dL — ABNORMAL LOW (ref 12.0–15.0)
MCH: 29.6 pg (ref 26.0–34.0)
MCHC: 34 g/dL (ref 30.0–36.0)
MCV: 87 fL (ref 80.0–100.0)
Platelets: 259 10*3/uL (ref 150–400)
RBC: 2.7 MIL/uL — ABNORMAL LOW (ref 3.87–5.11)
RDW: 15.8 % — ABNORMAL HIGH (ref 11.5–15.5)
WBC: 17.7 10*3/uL — AB (ref 4.0–10.5)
nRBC: 0 % (ref 0.0–0.2)

## 2018-07-17 LAB — BASIC METABOLIC PANEL
Anion gap: 9 (ref 5–15)
BUN: 27 mg/dL — ABNORMAL HIGH (ref 6–20)
CALCIUM: 7.7 mg/dL — AB (ref 8.9–10.3)
CO2: 24 mmol/L (ref 22–32)
Chloride: 103 mmol/L (ref 98–111)
Creatinine, Ser: 8.01 mg/dL — ABNORMAL HIGH (ref 0.44–1.00)
GFR calc Af Amer: 6 mL/min — ABNORMAL LOW (ref >60)
GFR calc non Af Amer: 5 mL/min — ABNORMAL LOW (ref >60)
Glucose, Bld: 294 mg/dL — ABNORMAL HIGH (ref 70–99)
Potassium: 3.4 mmol/L — ABNORMAL LOW (ref 3.5–5.1)
Sodium: 136 mmol/L (ref 135–145)

## 2018-07-17 LAB — GLUCOSE, CAPILLARY
GLUCOSE-CAPILLARY: 122 mg/dL — AB (ref 70–99)
Glucose-Capillary: 154 mg/dL — ABNORMAL HIGH (ref 70–99)
Glucose-Capillary: 90 mg/dL (ref 70–99)

## 2018-07-17 LAB — IRON AND TIBC
Iron: 20 ug/dL — ABNORMAL LOW (ref 28–170)
Saturation Ratios: 12 % (ref 10.4–31.8)
TIBC: 161 ug/dL — ABNORMAL LOW (ref 250–450)
UIBC: 141 ug/dL

## 2018-07-17 LAB — H. PYLORI ANTIGEN, STOOL: H. Pylori Stool Ag, Eia: NEGATIVE

## 2018-07-17 MED ORDER — DELFLEX-LC/4.25% DEXTROSE 483 MOSM/L IP SOLN: INTRAPERITONEAL | Status: DC

## 2018-07-17 MED ORDER — DELFLEX-LC/2.5% DEXTROSE 394 MOSM/L IP SOLN
INTRAPERITONEAL | Status: DC
  Administered 2018-07-17: 5000 mL via INTRAPERITONEAL

## 2018-07-17 MED ORDER — POTASSIUM CHLORIDE 20 MEQ PO PACK
20.0000 meq | PACK | Freq: Every day | ORAL | Status: DC
  Administered 2018-07-17: 20 meq via ORAL
  Filled 2018-07-17 (×2): qty 1

## 2018-07-17 MED ORDER — HEPARIN 1000 UNIT/ML FOR PERITONEAL DIALYSIS
INTRAPERITONEAL | Status: DC | PRN
  Filled 2018-07-17: qty 5000

## 2018-07-17 MED ORDER — DARBEPOETIN ALFA 100 MCG/0.5ML IJ SOSY: 100.0000 ug | PREFILLED_SYRINGE | INTRAMUSCULAR | Status: DC

## 2018-07-17 MED ORDER — DARBEPOETIN ALFA 100 MCG/0.5ML IJ SOSY
100.0000 ug | PREFILLED_SYRINGE | Freq: Once | INTRAMUSCULAR | Status: AC
  Administered 2018-07-17: 100 ug via SUBCUTANEOUS
  Filled 2018-07-17: qty 0.5

## 2018-07-17 MED ORDER — MAGNESIUM SULFATE 2 GM/50ML IV SOLN
2.0000 g | Freq: Once | INTRAVENOUS | Status: AC
  Administered 2018-07-17: 2 g via INTRAVENOUS
  Filled 2018-07-17: qty 50

## 2018-07-17 MED ORDER — PANTOPRAZOLE SODIUM 40 MG IV SOLR
40.0000 mg | Freq: Two times a day (BID) | INTRAVENOUS | Status: DC
  Administered 2018-07-17: 40 mg via INTRAVENOUS
  Filled 2018-07-17: qty 40

## 2018-07-17 MED ORDER — GENTAMICIN SULFATE 0.1 % EX CREA
1.0000 "application " | TOPICAL_CREAM | Freq: Every day | CUTANEOUS | Status: DC
  Administered 2018-07-17 – 2018-07-18 (×3): 1 via TOPICAL
  Filled 2018-07-17: qty 15

## 2018-07-17 MED ORDER — HEPARIN 1000 UNIT/ML FOR PERITONEAL DIALYSIS: 500.0000 [IU] | INTRAMUSCULAR | Status: DC | PRN

## 2018-07-17 MED ORDER — PANTOPRAZOLE SODIUM 40 MG PO TBEC
40.0000 mg | DELAYED_RELEASE_TABLET | Freq: Two times a day (BID) | ORAL | Status: DC
  Administered 2018-07-17 (×2): 40 mg via ORAL
  Filled 2018-07-17 (×2): qty 1

## 2018-07-17 NOTE — Progress Notes (Signed)
Patient prepared for transport to [address],. Educated regarding transfer procedures. Denies question / concern. Handoff report given to Otila Kluver, Therapist, sports. Transported to [place], via [place], by [employer] and [employer] No signs of distress prior to transport.

## 2018-07-17 NOTE — Progress Notes (Signed)
Transfer note  Priscilla [name] 48 year old lady with PMHx significant for ESRD s/p failed renal transplant on PD, pancreatic transplant, type 1 diabetes,CAD s/p CABG X2, mitral regurgitation s/p valve repair, hypertension and peripheral arterial disease s/p bilateral iliac artery stents, femoral arteriovenous fistula s/p recent repair on 07/07/18 presented to ED on 07/12/18 with hematemesis and melena.  Patient was on dual antiplatelets with aspirin 325 mg and Plavix 75 mg daily.  GI was consulted and then later patient was transferred to ICU due to hypotension and drop in her hemoglobin from 11-6.5, never required any pressor.  Underwent to EGD and found to have a bleeding ulcer lung greater curvature, exposed vessels were clipped, no more GI bleed, initially dual antiplatelet was held and restarted yesterday by GI.  Patient total received 2 units of packed RBCs and 1 unit of platelet.  Hemoglobin stable since then. Patient was moved to stepdown yesterday and I MTS was consulted to resume her care.  Subjective: Patient was feeling better when seen this morning.  Denies any more hematemesis or melena.  She was experiencing multiple episodes of diarrhea after eating yesterday.  Denies any melena or hematochezia.  This is her chronic problem due to pancreatic insufficiency.  She had diarrhea each time she eats something.  Denies any abdominal pain, had one episode of vomiting last night but denies any hematemesis.  Objective:  Vital signs in last 24 hours: Vitals:   07/17/18 0700 07/17/18 0800 07/17/18 0900 07/17/18 1000  BP:      Pulse:      Resp: (!) 7 18 16 20   Temp: 98.6 F (37 C)     TempSrc: Oral     SpO2:      Weight:      Height:       Vitals:   07/17/18 0700 07/17/18 0800 07/17/18 0900 07/17/18 1000  BP:   (!) 115/41   Pulse:      Resp: (!) 7 18 16 20   Temp: 98.6 F (37 C)     TempSrc: Oral     SpO2:      Weight:   57.4 kg   Height:       General: Vital  signs reviewed.  Patient is well-developed and sitting in chair, in no acute distress and cooperative with exam.  Head: Normocephalic and atraumatic. Eyes: EOMI, conjunctivae normal, no scleral icterus.  Cardiovascular: RRR, S1 normal, S2 normal, systolic murmur. Pulmonary/Chest: Clear to auscultation bilaterally, no wheezes, rales, or rhonchi. Abdominal: Soft, non-tender, non-distended, BS +, PD catheter with clean bandage. Extremities: No lower extremity edema bilaterally,  pulses symmetric and intact bilaterally.  Skin: Warm, dry and intact.  Psychiatric: Normal mood and affect. speech and behavior is normal. Cognition and memory are normal.  Assessment/Plan:  48 year old lady with multiple comorbidities, recent GI bleed due to bleeding ulcer, exposed vessels were clipped.  Upper GI bleed resolved since then.  Upper GI bleed.  She was on dual antiplatelet due to her peripheral arterial disease and recent stenting.  H. pylori negative.  Now resolved and hemoglobin stable. -Continue monitoring and she will get Aranesp from nephrology. -Aspirin and Plavix were restarted by GI yesterday. -Continue Protonix 40 mg twice daily. -Advance diet as tolerated.  Diarrhea.  Chronic issue due to pancreatic insufficiency. -Continue Creon and Imodium.  Electrolyte abnormalities.  Most likely due to diarrhea. -Replete electrolytes as needed.  Type 1 diabetes.  Uncontrolled  with last A1c on 06/30/18 was 11.  Couple of episodes of hypoglycemia so her insulin was decreased yesterday.  154 this morning. -Continue monitoring with SSI. -Novolin 16 units at bedtime.  ESRD.  Continue peritoneal dialysis as being managed by nephrology.   Dispo: Anticipated discharge in approximately 1-2 day(s).   Lorella Nimrod, MD 07/17/2018, 10:28 AM Pager: [phone]

## 2018-07-17 NOTE — Progress Notes (Signed)
   07/17/18 1800  Incision (Closed) 07/07/18 Groin Right  Date First Assessed/Time First Assessed: 07/07/18 0929   Location: Groin  Location Orientation: Right  Present on Admission: Yes  Dressing Old drainage (marked);Changed  Dressing Change Frequency PRN  Site / Wound Assessment Painful;Yellow  Margins Attached edges (approximated)  Drainage Amount Copious  Drainage Description Purulent  Treatment Cleansed   Notified teaching service about Incision appearance.  Saddie Benders RN

## 2018-07-17 NOTE — Plan of Care (Signed)
  Problem: Pain Managment: Goal: General experience of comfort will improve Outcome: Progressing   Problem: Elimination: Goal: Will not experience complications related to bowel motility Outcome: Not Progressing

## 2018-07-17 NOTE — Progress Notes (Signed)
I was called the patients bedside for concerns of drainage and pain to right groin AVF. Priscilla [name] reports a several day history of tenderness to the AVF site with increased drainage. She does not believe that the pain is worsening. She denies fevers and chills.  Physical exam: Blood pressure 134/71, pulse (!) 123, temperature 98.6 F (37 C), temperature source Oral, resp. rate 18, height 5' (1.524 m), weight 59.2 kg, SpO2 93 %. General-Lying in bed in no acute distress MSK- Her right femoral fistula repair site appears to have very mild drainage. The area is mildly tender to palpation. No warmth or erythema to the area.   Assessment/Plan: The site of her right femoral fistula does not appear to be infected. Additionally her vital signs have remained WNL with no fevers. Will continue to monitor for now.

## 2018-07-17 NOTE — Progress Notes (Addendum)
Pt had an episode of bright red emesis. She continues to remain nauseous despite antiemetic. BP 121/61 HR 113. Paged IM Dr. Alfonse Spruce. CBC and type & screen orders received along with IV protonix. Will continue to monitor.

## 2018-07-17 NOTE — Progress Notes (Addendum)
Inpatient Diabetes Program Recommendations  AACE/ADA: New Consensus Statement on Inpatient Glycemic Control (2015)  Target Ranges:  Prepandial:   less than 140 mg/dL      Peak postprandial:   less than 180 mg/dL (1-2 hours)      Critically ill patients:  140 - 180 mg/dL   Results for EMAAN, Priscilla [name] (MRN [id number]) as of 07/17/2018 11:58  Ref. Range 07/15/2018 23:12 07/16/2018 03:15 07/16/2018 09:17 07/16/2018 10:15 07/16/2018 11:19 07/16/2018 12:27 07/16/2018 13:09 07/16/2018 19:51  Glucose-Capillary Latest Ref Range: 70 - 99 mg/dL 193 (H)  2 units NOVOLOG  20 units NPH Insulin given at 10pm on 03/18 147 (H)  1 unit NOVOLOG  60 (L) 43 (LL)  75 grams Glucose gel 43 (LL) 56 (L) 146 (H) 126 (H)   Results for Priscilla [name], Priscilla [name] (MRN [id number]) as of 07/17/2018 11:58  Ref. Range 07/16/2018 23:00 07/17/2018 07:21 07/17/2018 11:04  Glucose-Capillary Latest Ref Range: 70 - 99 mg/dL 227 (H)  2 units NOVOLOG +  3 units NOVOLOG given at 4am  154 (H)  1 unit NOVOLOG  90     Home DM Meds: NPH 20 units QHS       Humalog 1-10 units TID per SSI  Current Orders: NPH 16 units QHS      Novolog 1-5 units Q4 hours      (Novolog coverage starts at 151 mg/dl)      MD- please note the following:  Patient refused NPH Insulin last night due to being Hypoglycemic on several occasions yesterday.  Libre continuous glucose monitor was placed on pt's arm yesterday and pt started frequently scanning for CBGs yesterday.  Trends are as follows: 3:11pm- 93 mg/dl 4:13pm- 101 mg/dl 7:13pm- 84 mg/dl 8:27pm- 115 mg/dl 10:31pm- 191 mg/dl 2:46am- 257 mg/dl 3:57am- 282 mg/dl 4:05am- 264 mg/dl 6:39am- 166 mg/dl 8:58am- 92 mg/dl 10:07am- 69 mg/dl 10:23am- 65 mg/dl 10:39am- 64 mg/dl 11:12am- 72 mg/dl 11:23am- 75 mg/dl   Based on the interpretation of the hospital CBG meter, Libre CGM meter, and Doses of Insulin given, recommend the following:  1. Reduce NPH Insulin to 10  units--  Recommend changing timing of the NPH Insulin to be given at 6pm so that it will peak closer to the time her CBGs are on the rise due to the effects of the glucose in the dilaysate  2. Reduce the amount of Novolog SSI patient is getting.  Pt got 1 unit Novolog at 8am today and per her Libre CGM results, her CBGs dropped to the mid 82s around 10am today:  Change Novolog to the following Q4 hours: 200-250 mg/dl- 1 unit 251-300 mg/dl- 2 units 301-350 mg/dl- 3 units 351-400 mg/dl- 4 units >400 mg/dl- Give 5 units and call MD    --Will follow patient during hospitalization--  Wyn Quaker RN, MSN, CDE Diabetes Coordinator Inpatient Glycemic Control Team Team Pager: [phone] (8a-5p)

## 2018-07-17 NOTE — TOC Initial Note (Signed)
Transition of Care [facility]) - Initial/Assessment Note    Patient Details  Name: Priscilla [name] MRN: [id number] Date of Birth: 28-Jun-1970  Transition of Care [facility]) CM/SW Contact:    Midge Minium RN, BSN, NCM-BC, ACM-RN [phone] Phone Number: 07/17/2018, 11:31 AM  Clinical Narrative: 48 yo female presented with hematemesis and melena. CM met with patient to discuss dispositional needs. Patient states living an home PTA and being independent with ADLs with no AD in use. Patient verified being independent with performing PD and denied any transportation needs nor difficulty obtaining her Rx. Demographics and PCP verified. Pharmacy of choice: [facility]. CM team will continue to follow.  Midge Minium RN, BSN, NCM-BC, ACM-RN [phone]                 Expected Discharge Plan: Home/Self Care Barriers to Discharge: Continued Medical Work up   Patient Goals and CMS Choice Patient states their goals for this hospitalization and ongoing recovery are:: "To get healthier overall" CMS Medicare.gov Compare Post Acute Care list provided to:: (N/A) Choice offered to / list presented to : NA  Expected Discharge Plan and Services Expected Discharge Plan: Home/Self Care In-house Referral: NA Discharge Planning Services: CM Consult Post Acute Care Choice: NA Living arrangements for the past 2 months: Single Family Home                 DME Arranged: N/A DME Agency: NA HH Arranged: NA HH Agency: NA  Prior Living Arrangements/Services Living arrangements for the past 2 months: Single Family Home Lives with:: Self, Adult Children, Siblings(2 sisters and son) Patient language and need for interpreter reviewed:: No Do you feel safe going back to the place where you live?: Yes      Need for Family Participation in Patient Care: No (Comment) Care giver support system in place?: Yes (comment) Current home services: Other (comment)([facility]) Criminal Activity/Legal Involvement  Pertinent to Current Situation/Hospitalization: No - Comment as needed  Activities of Daily Living Home Assistive Devices/Equipment: Cane (specify quad or straight), CBG Meter ADL Screening (condition at time of admission) Patient's cognitive ability adequate to safely complete daily activities?: Yes Is the patient deaf or have difficulty hearing?: No Does the patient have difficulty seeing, even when wearing glasses/contacts?: No Does the patient have difficulty concentrating, remembering, or making decisions?: No Patient able to express need for assistance with ADLs?: Yes Does the patient have difficulty dressing or bathing?: No Independently performs ADLs?: Yes (appropriate for developmental age) Does the patient have difficulty walking or climbing stairs?: Yes Weakness of Legs: Both Weakness of Arms/Hands: None  Permission Sought/Granted Permission sought to share information with : Case Manager Permission granted to share information with : Yes, Verbal Permission Granted              Emotional Assessment Appearance:: Appears stated age Attitude/Demeanor/Rapport: Engaged, Gracious Affect (typically observed): Accepting, Appropriate, Calm, Happy, Stable Orientation: : Oriented to Place, Oriented to  Time, Oriented to Situation Alcohol / Substance Use: Not Applicable Psych Involvement: No (comment)  Admission diagnosis:  Gastrointestinal hemorrhage, unspecified gastrointestinal hemorrhage type [K92.2] GI bleed [K92.2] Patient Active Problem List   Diagnosis Date Noted  . Malnutrition of moderate degree 07/17/2018  . GI bleed 07/12/2018  . Hypovolemic shock ([place])   . Anemia associated with acute blood loss   . AVF (arteriovenous fistula) ([place]) 07/07/2018  . History of mitral valve annuloplasty #28 Memo ring 08/02/2016  . Mixed hyperlipidemia 08/02/2016  . Elevation of cardiac enzymes   . Difficult  intravenous access   . DKA (diabetic ketoacidoses) ([place]) 05/14/2016  .  Microcytic anemia 05/14/2016  . Hyponatremia 05/14/2016  . Transaminitis 05/14/2016  . CHF (congestive heart failure) ([place])   . Essential hypertension 11/28/2014  . 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 Mitral regurgitation 06/29/2014  . Abnormal EKG   . Coronary artery disease involving native coronary artery of native heart without angina pectoris   . Cardiomyopathy, ischemic 06/24/2014  . Abnormal nuclear cardiac imaging test 06/24/2014  . 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 ESRD (end stage renal disease) ([place]) 09/24/2012  . Diabetes mellitus type 1 ([place]) 09/24/2012  . 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 Pancreatitis of pancreas transplant 09/24/2012   PCP:  [name], MD Pharmacy:   [facility] [place], [address] [place] [address] Phone: [phone] Fax: [phone]     Social Determinants of Health (SDOH) Interventions    Readmission Risk Interventions Readmission Risk Prevention Plan 07/17/2018  Transportation Screening Complete  PCP or Specialist Appt within 3-5 Days Not Complete  Not Complete comments Patient is being managed by Nephrology for outpatient appointment for ESRD  HRI or Home Care Consult Complete  Social Work Consult for [place] Planning/Counseling Not Complete  SW consult not completed comments Not appropriate; patient verbalized being independent and declined any additional needs  Palliative Care Screening Not Applicable  Medication Review Press photographer) Complete  Some recent data might be hidden

## 2018-07-17 NOTE — Progress Notes (Signed)
Called to Priscilla [name]'s bedside after she experienced an episode of "bright red bloody looking emesis". Priscilla [name] was transferred from the ICU this morning. She was admitted for hemorrhagic shock 2/2 upper GI bleed from gastric ulcers. EGD performed on days 1 and 2 were successful in establishing hemostasis and her H/H have stabilized after receiving 2 units pRBCs and 1 unit of platelets. It appears that she has not had any new episodes of melena or hematochezia until tonight. She reports filling "1/4" of the emesis bag. She remains nauseous.   Vitals: Blood pressure 121/61, pulse 113 Physical Exam: General-Lying in bed in no acute distress. Abdominal-Soft, non-tender to palpation, non-distended  Assessment/Plan: I am concerned for an upper GI bleed from her known gastric ulcers. It is however reassuring that her vital signs remain unchanged with normal blood pressure. Will plan on giving IV pantoprazole 40 mg, check CBC w/diff and type/screen. If she has additional episodes of hematemesis or vital sign disturbances, will consider consulting GI.

## 2018-07-17 NOTE — Progress Notes (Addendum)
[facility] Progress Note  Subjective: feeling better, BP's are better today. Stood to weigh at 57.4kg  Vitals:   07/17/18 0900 07/17/18 1000 07/17/18 1100 07/17/18 1200  BP: (!) 115/41     Pulse:      Resp: 16 20 (!) 21 20  Temp:   98.5 F (36.9 C)   TempSrc:   Oral   SpO2:      Weight: 57.4 kg     Height:        Inpatient medications: . sodium chloride   Intravenous Once  . amitriptyline  10 mg Oral QHS  . aspirin EC  81 mg Oral Daily  . atorvastatin  10 mg Oral QHS  . clopidogrel  75 mg Oral Daily  . feeding supplement (NEPRO CARB STEADY)  237 mL Oral TID BM  . gabapentin  100 mg Oral TID  . gentamicin cream  1 application Topical Daily  . insulin aspart  1-5 Units Subcutaneous Q4H  . insulin NPH Human  16 Units Subcutaneous QPM  . latanoprost  1 drop Both Eyes QHS  . lipase/protease/amylase  108,000 Units Oral TID WC  . lipase/protease/amylase  72,000 Units Oral With snacks  . multivitamin  1 tablet Oral QHS  . pantoprazole  40 mg Oral BID  . potassium chloride  30 mEq Oral TID  . potassium chloride  20 mEq Oral Daily   . sodium chloride Stopped (07/13/18 1109)  . dialysis solution 2.5% low-MG/low-CA    . dialysis solution 4.25% low-MG/low-CA     sodium chloride, acetaminophen, dianeal solution for CAPD/CCPD with heparin, dianeal solution for CAPD/CCPD with heparin, loperamide, promethazine  Iron/TIBC/Ferritin/ %Sat    Component Value Date/Time   IRON 21 (L) 09/25/2012 0525   TIBC 274 09/25/2012 0525   FERRITIN 28 09/25/2012 0525   IRONPCTSAT 8 (L) 09/25/2012 0525    Exam: Gen alert, no distress No jvd or bruits Chest clear bilat RRR no MRG Abd soft ntnd no mass or ascites +bs  LUQ PD cath clean exit site Ext no LE edema Neuro is alert, Ox 3 , nf      Home meds:  - losartan 50 hs/ carvedilol 6.25 bid/ clonidine 0.1 mg hs/ kdur 20 qd  - calc acetate 4 ac tid/ creon tid ac  - insulin nph 20u qd/ insulin lispro 1-10 ssi tid  -  gabapentin 100 tid/ amitriptyline 10 hs/ oxycodone- acetaminophen 1 qid prn  - atorvastatin 10 hs/ clopidogrel 75/ ecasa 81 qd/     PD [facility] EDW 54 kg 6 total exchanges- 5 on the cycler 2 L fill vol 1 hr 30 min dwell time and 1 pause of 1500 mL, 4 hrs, dry day.     Assessment/ Plan: 1. Upper GIB - EGD x2, on 3/16 had Rx of gastric ulcers w/ stigmata of bleeding x2.  Bleeding appears to be abating. Hb stable.  2. Anemia abl/ ckd - sp 2up prbc's on 3/15. Hb stable now. Will give esa darbe 100 ug today Sq then weekly.  Check tsat.  3. Hypotension - due to bleed/ anemia/ losartan x 1, resolving.  4. Volume ^ - improving, up 2-3kg today. Cont 2.5 and 4.25% fluids w/ pd 5. ESRD on CCPD - cont PD nightly 6. CAD hx CABG/ MV repair 7. HTN - bp's soft, dc losartan.  8. HL 9. DM2 on insulin 10. Hypomagnesemia - very low Mg 1.1, got 2gm yest and 4gm IV today. Repeat in am.  11. Hypokalemia -  repleting po, diarrhea contributing, K+ up today      [facility] 07/17/2018, 12:15 PM  Recent Labs  Lab 07/12/18 1558 07/13/18 0616  07/16/18 0858 07/16/18 1157 07/17/18 0438  NA 134* 138   < > 137  --  136  K 3.0* 3.2*   < > 2.9*  --  3.4*  CL 94* 105   < > 104  --  103  CO2 27 21*   < > 24  --  24  GLUCOSE 335* 136*   < > 70 57* 294*  BUN 32* 43*   < > 30*  --  27*  CREATININE 9.76* 9.39*   < > 8.05*  --  8.01*  CALCIUM 8.1* 7.3*   < > 7.4*  --  7.7*  PHOS  --   --   --  3.3  --  3.1  ALBUMIN 1.7* 1.9*  --   --   --   --   INR 1.3*  --   --   --   --   --    < > = values in this interval not displayed.   Recent Labs  Lab 07/12/18 1558 07/13/18 0616  AST 22 25  ALT 5 8  ALKPHOS 228* 123  BILITOT 0.5 0.6  PROT 5.9* 4.5*   Recent Labs  Lab 07/12/18 1558  07/16/18 0858 07/17/18 0741  WBC 12.4*   < > 14.9* 17.7*  NEUTROABS 9.7*  --  11.5*  --   HGB 10.3*   < > 7.8* 8.0*  HCT 32.8*   < > 24.0* 23.5*  MCV 83.0   < > 87.6 87.0  PLT 355   < > 242 259    < > = values in this interval not displayed.

## 2018-07-18 DIAGNOSIS — E1065 Type 1 diabetes mellitus with hyperglycemia: Secondary | ICD-10-CM

## 2018-07-18 DIAGNOSIS — I255 Ischemic cardiomyopathy: Secondary | ICD-10-CM

## 2018-07-18 DIAGNOSIS — K920 Hematemesis: Secondary | ICD-10-CM

## 2018-07-18 LAB — RENAL FUNCTION PANEL
Albumin: 1.9 g/dL — ABNORMAL LOW (ref 3.5–5.0)
Anion gap: 16 — ABNORMAL HIGH (ref 5–15)
BUN: 28 mg/dL — ABNORMAL HIGH (ref 6–20)
CO2: 20 mmol/L — ABNORMAL LOW (ref 22–32)
Calcium: 8.1 mg/dL — ABNORMAL LOW (ref 8.9–10.3)
Chloride: 99 mmol/L (ref 98–111)
Creatinine, Ser: 8.58 mg/dL — ABNORMAL HIGH (ref 0.44–1.00)
GFR calc Af Amer: 6 mL/min — ABNORMAL LOW (ref >60)
GFR calc non Af Amer: 5 mL/min — ABNORMAL LOW (ref >60)
Glucose, Bld: 217 mg/dL — ABNORMAL HIGH (ref 70–99)
Phosphorus: 2.8 mg/dL (ref 2.5–4.6)
Potassium: 5.5 mmol/L — ABNORMAL HIGH (ref 3.5–5.1)
Sodium: 135 mmol/L (ref 135–145)

## 2018-07-18 LAB — CBC
HCT: 22 % — ABNORMAL LOW (ref 36.0–46.0)
HCT: 24.5 % — ABNORMAL LOW (ref 36.0–46.0)
Hemoglobin: 7.2 g/dL — ABNORMAL LOW (ref 12.0–15.0)
Hemoglobin: 7.9 g/dL — ABNORMAL LOW (ref 12.0–15.0)
MCH: 28.6 pg (ref 26.0–34.0)
MCH: 28.4 pg (ref 26.0–34.0)
MCHC: 32.7 g/dL (ref 30.0–36.0)
MCHC: 32.2 g/dL (ref 30.0–36.0)
MCV: 87.3 fL (ref 80.0–100.0)
MCV: 88.1 fL (ref 80.0–100.0)
Platelets: 275 10*3/uL (ref 150–400)
Platelets: 297 10*3/uL (ref 150–400)
RBC: 2.52 MIL/uL — ABNORMAL LOW (ref 3.87–5.11)
RBC: 2.78 MIL/uL — ABNORMAL LOW (ref 3.87–5.11)
RDW: 15.8 % — ABNORMAL HIGH (ref 11.5–15.5)
RDW: 15.9 % — ABNORMAL HIGH (ref 11.5–15.5)
WBC: 23.7 10*3/uL — ABNORMAL HIGH (ref 4.0–10.5)
WBC: 27.6 10*3/uL — ABNORMAL HIGH (ref 4.0–10.5)
nRBC: 0 % (ref 0.0–0.2)
nRBC: 0 % (ref 0.0–0.2)

## 2018-07-18 LAB — CBC WITH DIFFERENTIAL/PLATELET
Abs Immature Granulocytes: 0.19 10*3/uL — ABNORMAL HIGH (ref 0.00–0.07)
Abs Immature Granulocytes: 0.24 10*3/uL — ABNORMAL HIGH (ref 0.00–0.07)
Basophils Absolute: 0.1 10*3/uL (ref 0.0–0.1)
Basophils Absolute: 0.1 10*3/uL (ref 0.0–0.1)
Basophils Relative: 0 %
Basophils Relative: 0 %
Eosinophils Absolute: 0.7 10*3/uL — ABNORMAL HIGH (ref 0.0–0.5)
Eosinophils Absolute: 0.7 10*3/uL — ABNORMAL HIGH (ref 0.0–0.5)
Eosinophils Relative: 3 %
Eosinophils Relative: 3 %
HCT: 21.8 % — ABNORMAL LOW (ref 36.0–46.0)
HCT: 22.4 % — ABNORMAL LOW (ref 36.0–46.0)
Hemoglobin: 7 g/dL — ABNORMAL LOW (ref 12.0–15.0)
Hemoglobin: 7.5 g/dL — ABNORMAL LOW (ref 12.0–15.0)
Immature Granulocytes: 1 %
Immature Granulocytes: 1 %
Lymphocytes Relative: 5 %
Lymphocytes Relative: 6 %
Lymphs Abs: 1.3 10*3/uL (ref 0.7–4.0)
Lymphs Abs: 1.5 10*3/uL (ref 0.7–4.0)
MCH: 28 pg (ref 26.0–34.0)
MCH: 29.4 pg (ref 26.0–34.0)
MCHC: 32.1 g/dL (ref 30.0–36.0)
MCHC: 33.5 g/dL (ref 30.0–36.0)
MCV: 87.2 fL (ref 80.0–100.0)
MCV: 87.8 fL (ref 80.0–100.0)
Monocytes Absolute: 1.5 10*3/uL — ABNORMAL HIGH (ref 0.1–1.0)
Monocytes Absolute: 1.8 10*3/uL — ABNORMAL HIGH (ref 0.1–1.0)
Monocytes Relative: 6 %
Monocytes Relative: 8 %
Neutro Abs: 19 10*3/uL — ABNORMAL HIGH (ref 1.7–7.7)
Neutro Abs: 21.1 10*3/uL — ABNORMAL HIGH (ref 1.7–7.7)
Neutrophils Relative %: 82 %
Neutrophils Relative %: 85 %
Platelets: 259 10*3/uL (ref 150–400)
Platelets: 269 10*3/uL (ref 150–400)
RBC: 2.5 MIL/uL — ABNORMAL LOW (ref 3.87–5.11)
RBC: 2.55 MIL/uL — ABNORMAL LOW (ref 3.87–5.11)
RDW: 15.8 % — ABNORMAL HIGH (ref 11.5–15.5)
RDW: 15.8 % — ABNORMAL HIGH (ref 11.5–15.5)
WBC: 23.3 10*3/uL — ABNORMAL HIGH (ref 4.0–10.5)
WBC: 24.8 10*3/uL — ABNORMAL HIGH (ref 4.0–10.5)
nRBC: 0 % (ref 0.0–0.2)
nRBC: 0 % (ref 0.0–0.2)

## 2018-07-18 LAB — TYPE AND SCREEN
ABO/RH(D): O POS
Antibody Screen: NEGATIVE

## 2018-07-18 LAB — MAGNESIUM: Magnesium: 1.9 mg/dL (ref 1.7–2.4)

## 2018-07-18 LAB — GLUCOSE, CAPILLARY
GLUCOSE-CAPILLARY: 114 mg/dL — AB (ref 70–99)
GLUCOSE-CAPILLARY: 264 mg/dL — AB (ref 70–99)
Glucose-Capillary: 112 mg/dL — ABNORMAL HIGH (ref 70–99)
Glucose-Capillary: 97 mg/dL (ref 70–99)
Glucose-Capillary: 165 mg/dL — ABNORMAL HIGH (ref 70–99)

## 2018-07-18 MED ORDER — SODIUM CHLORIDE 0.9 % IV SOLN
1000.0000 mg | Freq: Once | INTRAVENOUS | Status: DC
  Filled 2018-07-18: qty 20

## 2018-07-18 MED ORDER — SODIUM ZIRCONIUM CYCLOSILICATE 10 G PO PACK: 10.0000 g | PACK | Freq: Three times a day (TID) | ORAL | Status: DC

## 2018-07-18 MED ORDER — INSULIN ASPART 100 UNIT/ML ~~LOC~~ SOLN
1.0000 [IU] | SUBCUTANEOUS | Status: DC
  Administered 2018-07-18 – 2018-07-23 (×16): 1 [IU] via SUBCUTANEOUS
  Administered 2018-07-24: 2 [IU] via SUBCUTANEOUS
  Administered 2018-07-24 (×2): 1 [IU] via SUBCUTANEOUS

## 2018-07-18 MED ORDER — SODIUM CHLORIDE 0.9 % IV SOLN
250.0000 mg | Freq: Once | INTRAVENOUS | Status: AC
  Administered 2018-07-18: 250 mg via INTRAVENOUS
  Filled 2018-07-18: qty 20

## 2018-07-18 MED ORDER — DELFLEX-LC/2.5% DEXTROSE 394 MOSM/L IP SOLN: INTRAPERITONEAL | Status: DC

## 2018-07-18 MED ORDER — SODIUM CHLORIDE 0.9 % IV SOLN
80.0000 mg | Freq: Once | INTRAVENOUS | Status: AC
  Administered 2018-07-18: 80 mg via INTRAVENOUS
  Filled 2018-07-18: qty 80

## 2018-07-18 MED ORDER — SODIUM ZIRCONIUM CYCLOSILICATE 10 G PO PACK
10.0000 g | PACK | Freq: Once | ORAL | Status: DC
  Filled 2018-07-18: qty 1

## 2018-07-18 MED ORDER — METOCLOPRAMIDE HCL 5 MG/ML IJ SOLN
10.0000 mg | Freq: Four times a day (QID) | INTRAMUSCULAR | Status: AC
  Administered 2018-07-18 – 2018-07-19 (×4): 10 mg via INTRAVENOUS
  Filled 2018-07-18 (×4): qty 2

## 2018-07-18 MED ORDER — SODIUM CHLORIDE 0.9 % IV SOLN
8.0000 mg/h | INTRAVENOUS | Status: DC
  Administered 2018-07-18: 8 mg/h via INTRAVENOUS
  Filled 2018-07-18 (×2): qty 80

## 2018-07-18 MED ORDER — SODIUM CHLORIDE 0.9 % IV SOLN
125.0000 mg | INTRAVENOUS | Status: DC
  Administered 2018-07-20 – 2018-07-22 (×2): 125 mg via INTRAVENOUS
  Filled 2018-07-18 (×3): qty 10

## 2018-07-18 MED ORDER — GENTAMICIN SULFATE 0.1 % EX CREA
1.0000 "application " | TOPICAL_CREAM | Freq: Every day | CUTANEOUS | Status: DC
  Administered 2018-07-18: 1 via TOPICAL
  Filled 2018-07-18: qty 15

## 2018-07-18 MED ORDER — HEPARIN 1000 UNIT/ML FOR PERITONEAL DIALYSIS
500.0000 [IU] | INTRAMUSCULAR | Status: DC | PRN
  Filled 2018-07-18: qty 0.5

## 2018-07-18 MED ORDER — INSULIN NPH (HUMAN) (ISOPHANE) 100 UNIT/ML ~~LOC~~ SUSP
10.0000 [IU] | Freq: Every evening | SUBCUTANEOUS | Status: DC
  Administered 2018-07-18 – 2018-07-20 (×3): 10 [IU] via SUBCUTANEOUS
  Filled 2018-07-18 (×2): qty 10

## 2018-07-18 MED ORDER — SODIUM ZIRCONIUM CYCLOSILICATE 10 G PO PACK
10.0000 g | PACK | Freq: Three times a day (TID) | ORAL | Status: AC
  Administered 2018-07-18: 10 g via ORAL
  Filled 2018-07-18: qty 1

## 2018-07-18 NOTE — Progress Notes (Signed)
Subjective: Patient had one episode of bloody emesis at around 10:40 yesterday evening. She continues to feel nauseous, further hematemesis, bowel movement was yesterday morning-denies melena or hematochezia.  Objective: Vital signs in last 24 hours: Temp:  [98.3 F (36.8 C)-99.2 F (37.3 C)] 99.2 F (37.3 C) (03/21 0406) Pulse Rate:  [90-123] 90 (03/21 0755) Resp:  [14-24] 16 (03/21 0600) BP: (98-143)/(19-71) 133/19 (03/21 0755) SpO2:  [93 %-100 %] 100 % (03/21 0406) Weight:  [59.2 kg] 59.2 kg (03/20 1859) Weight change: -1.9 kg Last BM Date: 07/17/18  PE: Not in distress GENERAL: Prominent pallor ABDOMEN: Soft, nondistended, normoactive bowel sounds EXTREMITIES: no Deformity  Lab Results: Results for orders placed or performed during the hospital encounter of 07/12/18 (from the past 48 hour(s))  Glucose, capillary     Status: Abnormal   Collection Time: 07/16/18 10:15 AM  Result Value Ref Range   Glucose-Capillary 43 (LL) 70 - 99 mg/dL  Glucose, capillary     Status: Abnormal   Collection Time: 07/16/18 11:19 AM  Result Value Ref Range   Glucose-Capillary 43 (LL) 70 - 99 mg/dL  Glucose, random     Status: Abnormal   Collection Time: 07/16/18 11:57 AM  Result Value Ref Range   Glucose, Bld 57 (L) 70 - 99 mg/dL    Comment: Performed at [facility], [address] [place], [place] [address]  Glucose, capillary     Status: Abnormal   Collection Time: 07/16/18 12:27 PM  Result Value Ref Range   Glucose-Capillary 56 (L) 70 - 99 mg/dL  Glucose, capillary     Status: Abnormal   Collection Time: 07/16/18  1:09 PM  Result Value Ref Range   Glucose-Capillary 146 (H) 70 - 99 mg/dL  Glucose, capillary     Status: Abnormal   Collection Time: 07/16/18  3:03 PM  Result Value Ref Range   Glucose-Capillary 112 (H) 70 - 99 mg/dL  Glucose, capillary     Status: Abnormal   Collection Time: 07/16/18  4:21 PM  Result Value Ref Range   Glucose-Capillary 114 (H) 70 - 99 mg/dL   Glucose, capillary     Status: Abnormal   Collection Time: 07/16/18  7:51 PM  Result Value Ref Range   Glucose-Capillary 126 (H) 70 - 99 mg/dL  Glucose, capillary     Status: Abnormal   Collection Time: 07/16/18 11:00 PM  Result Value Ref Range   Glucose-Capillary 227 (H) 70 - 99 mg/dL  Basic metabolic panel     Status: Abnormal   Collection Time: 07/17/18  4:38 AM  Result Value Ref Range   Sodium 136 135 - 145 mmol/L   Potassium 3.4 (L) 3.5 - 5.1 mmol/L   Chloride 103 98 - 111 mmol/L   CO2 24 22 - 32 mmol/L   Glucose, Bld 294 (H) 70 - 99 mg/dL   BUN 27 (H) 6 - 20 mg/dL   Creatinine, Ser 8.01 (H) 0.44 - 1.00 mg/dL   Calcium 7.7 (L) 8.9 - 10.3 mg/dL   GFR calc non Af Amer 5 (L) >60 mL/min   GFR calc Af Amer 6 (L) >60 mL/min   Anion gap 9 5 - 15    Comment: Performed at [facility], [place] [address], [place], [place] [address]  Magnesium     Status: Abnormal   Collection Time: 07/17/18  4:38 AM  Result Value Ref Range   Magnesium 1.6 (L) 1.7 - 2.4 mg/dL    Comment: Performed at  [facility], [place] [address], [place], [place] [address]  Phosphorus     Status: None   Collection Time: 07/17/18  4:38 AM  Result Value Ref Range   Phosphorus 3.1 2.5 - 4.6 mg/dL    Comment: Performed at [place] [address], [place], [place] [address]  Glucose, capillary     Status: Abnormal   Collection Time: 07/17/18  7:21 AM  Result Value Ref Range   Glucose-Capillary 154 (H) 70 - 99 mg/dL  CBC     Status: Abnormal   Collection Time: 07/17/18  7:41 AM  Result Value Ref Range   WBC 17.7 (H) 4.0 - 10.5 K/uL   RBC 2.70 (L) 3.87 - 5.11 MIL/uL   Hemoglobin 8.0 (L) 12.0 - 15.0 g/dL   HCT 23.5 (L) 36.0 - 46.0 %   MCV 87.0 80.0 - 100.0 fL   MCH 29.6 26.0 - 34.0 pg   MCHC 34.0 30.0 - 36.0 g/dL   RDW 15.8 (H) 11.5 - 15.5 %   Platelets 259 150 - 400 K/uL   nRBC 0.0 0.0 - 0.2 %    Comment: Performed at [facility], [place] [address] [place], [place] [address]  Glucose,  capillary     Status: None   Collection Time: 07/17/18 11:04 AM  Result Value Ref Range   Glucose-Capillary 90 70 - 99 mg/dL  Iron and TIBC     Status: Abnormal   Collection Time: 07/17/18  2:10 PM  Result Value Ref Range   Iron 20 (L) 28 - 170 ug/dL   TIBC 161 (L) 250 - 450 ug/dL   Saturation Ratios 12 10.4 - 31.8 %   UIBC 141 ug/dL    Comment: Performed at [facility], [place] [address], [place], [place] [address]  Glucose, capillary     Status: Abnormal   Collection Time: 07/17/18  3:35 PM  Result Value Ref Range   Glucose-Capillary 122 (H) 70 - 99 mg/dL  Type and screen [place]     Status: None   Collection Time: 07/17/18 11:30 PM  Result Value Ref Range   ABO/RH(D) O POS    Antibody Screen NEG    Sample Expiration      07/20/2018 Performed at [facility], [place] [address] [place], [place] [address]   CBC with Differential     Status: Abnormal   Collection Time: 07/17/18 11:39 PM  Result Value Ref Range   WBC 24.8 (H) 4.0 - 10.5 K/uL   RBC 2.55 (L) 3.87 - 5.11 MIL/uL   Hemoglobin 7.5 (L) 12.0 - 15.0 g/dL   HCT 22.4 (L) 36.0 - 46.0 %   MCV 87.8 80.0 - 100.0 fL   MCH 29.4 26.0 - 34.0 pg   MCHC 33.5 30.0 - 36.0 g/dL   RDW 15.8 (H) 11.5 - 15.5 %   Platelets 269 150 - 400 K/uL   nRBC 0.0 0.0 - 0.2 %   Neutrophils Relative % 85 %   Neutro Abs 21.1 (H) 1.7 - 7.7 K/uL   Lymphocytes Relative 5 %   Lymphs Abs 1.3 0.7 - 4.0 K/uL   Monocytes Relative 6 %   Monocytes Absolute 1.5 (H) 0.1 - 1.0 K/uL   Eosinophils Relative 3 %   Eosinophils Absolute 0.7 (H) 0.0 - 0.5 K/uL   Basophils Relative 0 %   Basophils Absolute 0.1 0.0 - 0.1 K/uL   Immature Granulocytes 1 %  Abs Immature Granulocytes 0.19 (H) 0.00 - 0.07 K/uL    Comment: Performed at [facility], [place] [address] [place], [place] [address]  Renal function panel     Status: Abnormal   Collection Time: 07/18/18 12:14 AM  Result Value Ref Range   Sodium 135 135 - 145 mmol/L    Potassium 5.5 (H) 3.5 - 5.1 mmol/L    Comment: DELTA CHECK NOTED   Chloride 99 98 - 111 mmol/L   CO2 20 (L) 22 - 32 mmol/L   Glucose, Bld 217 (H) 70 - 99 mg/dL   BUN 28 (H) 6 - 20 mg/dL   Creatinine, Ser 8.58 (H) 0.44 - 1.00 mg/dL   Calcium 8.1 (L) 8.9 - 10.3 mg/dL   Phosphorus 2.8 2.5 - 4.6 mg/dL   Albumin 1.9 (L) 3.5 - 5.0 g/dL   GFR calc non Af Amer 5 (L) >60 mL/min   GFR calc Af Amer 6 (L) >60 mL/min   Anion gap 16 (H) 5 - 15    Comment: Performed at [facility], [place] [address], [place], [place] [address]  Magnesium     Status: None   Collection Time: 07/18/18 12:14 AM  Result Value Ref Range   Magnesium 1.9 1.7 - 2.4 mg/dL    Comment: Performed at [place] [address], [place], [place] [address]  CBC with Differential     Status: Abnormal   Collection Time: 07/18/18  3:21 AM  Result Value Ref Range   WBC 23.3 (H) 4.0 - 10.5 K/uL   RBC 2.50 (L) 3.87 - 5.11 MIL/uL   Hemoglobin 7.0 (L) 12.0 - 15.0 g/dL   HCT 21.8 (L) 36.0 - 46.0 %   MCV 87.2 80.0 - 100.0 fL   MCH 28.0 26.0 - 34.0 pg   MCHC 32.1 30.0 - 36.0 g/dL   RDW 15.8 (H) 11.5 - 15.5 %   Platelets 259 150 - 400 K/uL   nRBC 0.0 0.0 - 0.2 %   Neutrophils Relative % 82 %   Neutro Abs 19.0 (H) 1.7 - 7.7 K/uL   Lymphocytes Relative 6 %   Lymphs Abs 1.5 0.7 - 4.0 K/uL   Monocytes Relative 8 %   Monocytes Absolute 1.8 (H) 0.1 - 1.0 K/uL   Eosinophils Relative 3 %   Eosinophils Absolute 0.7 (H) 0.0 - 0.5 K/uL   Basophils Relative 0 %   Basophils Absolute 0.1 0.0 - 0.1 K/uL   Immature Granulocytes 1 %   Abs Immature Granulocytes 0.24 (H) 0.00 - 0.07 K/uL    Comment: Performed at [facility], [place] [address] [place], [place] [address]  CBC     Status: Abnormal   Collection Time: 07/18/18  7:50 AM  Result Value Ref Range   WBC 23.7 (H) 4.0 - 10.5 K/uL   RBC 2.52 (L) 3.87 - 5.11 MIL/uL   Hemoglobin 7.2 (L) 12.0 - 15.0 g/dL   HCT 22.0 (L) 36.0 - 46.0 %   MCV 87.3 80.0 - 100.0 fL   MCH 28.6  26.0 - 34.0 pg   MCHC 32.7 30.0 - 36.0 g/dL   RDW 15.8 (H) 11.5 - 15.5 %   Platelets 275 150 - 400 K/uL   nRBC 0.0 0.0 - 0.2 %    Comment: Performed at [facility], [place] [address] [place], [place] [address]  Glucose, capillary     Status: Abnormal   Collection Time: 07/18/18  7:54 AM  Result Value Ref Range   Glucose-Capillary 264 (H) 70 - 99 mg/dL    Studies/Results: No results found.  Medications: I have reviewed the patient's current medications.  Assessment: Gastric ulcer with visible vessel treated with epinephrine, Endo Clip placement and bipolar cautery on 07/13/2018, aspirin and Plavix were resumed 3 days later when there was no further episode of ongoing bleeding. Patient had another episode of hematemesis.  End-stage renal disease on peritoneal dialysis, anemia, hypotension, CAD, CABG, MV repair, diabetes, PAD with bilateral iliac stents and right femoral AV fistula  Plan: Recommend to hold aspirin and Plavix, monitor H&H and transfuse if hemoglobin is less than 7. Start IV Protonix drip. Keep patient n.p.o..    Ronnette Juniper 07/18/2018, 9:51 AM   Pager [phone] If no answer or after 5 PM call [phone]

## 2018-07-18 NOTE — H&P (View-Only) (Signed)
Subjective: Patient had one episode of bloody emesis at around 10:40 yesterday evening. She continues to feel nauseous, further hematemesis, bowel movement was yesterday morning-denies melena or hematochezia.  Objective: Vital signs in last 24 hours: Temp:  [98.3 F (36.8 C)-99.2 F (37.3 C)] 99.2 F (37.3 C) (03/21 0406) Pulse Rate:  [90-123] 90 (03/21 0755) Resp:  [14-24] 16 (03/21 0600) BP: (98-143)/(19-71) 133/19 (03/21 0755) SpO2:  [93 %-100 %] 100 % (03/21 0406) Weight:  [59.2 kg] 59.2 kg (03/20 1859) Weight change: -1.9 kg Last BM Date: 07/17/18  PE: Not in distress GENERAL: Prominent pallor ABDOMEN: Soft, nondistended, normoactive bowel sounds EXTREMITIES: no Deformity  Lab Results: Results for orders placed or performed during the hospital encounter of 07/12/18 (from the past 48 hour(s))  Glucose, capillary     Status: Abnormal   Collection Time: 07/16/18 10:15 AM  Result Value Ref Range   Glucose-Capillary 43 (LL) 70 - 99 mg/dL  Glucose, capillary     Status: Abnormal   Collection Time: 07/16/18 11:19 AM  Result Value Ref Range   Glucose-Capillary 43 (LL) 70 - 99 mg/dL  Glucose, random     Status: Abnormal   Collection Time: 07/16/18 11:57 AM  Result Value Ref Range   Glucose, Bld 57 (L) 70 - 99 mg/dL    Comment: Performed at [facility], [address], [place], [place] [address]  Glucose, capillary     Status: Abnormal   Collection Time: 07/16/18 12:27 PM  Result Value Ref Range   Glucose-Capillary 56 (L) 70 - 99 mg/dL  Glucose, capillary     Status: Abnormal   Collection Time: 07/16/18  1:09 PM  Result Value Ref Range   Glucose-Capillary 146 (H) 70 - 99 mg/dL  Glucose, capillary     Status: Abnormal   Collection Time: 07/16/18  3:03 PM  Result Value Ref Range   Glucose-Capillary 112 (H) 70 - 99 mg/dL  Glucose, capillary     Status: Abnormal   Collection Time: 07/16/18  4:21 PM  Result Value Ref Range   Glucose-Capillary 114 (H) 70 - 99 mg/dL   Glucose, capillary     Status: Abnormal   Collection Time: 07/16/18  7:51 PM  Result Value Ref Range   Glucose-Capillary 126 (H) 70 - 99 mg/dL  Glucose, capillary     Status: Abnormal   Collection Time: 07/16/18 11:00 PM  Result Value Ref Range   Glucose-Capillary 227 (H) 70 - 99 mg/dL  Basic metabolic panel     Status: Abnormal   Collection Time: 07/17/18  4:38 AM  Result Value Ref Range   Sodium 136 135 - 145 mmol/L   Potassium 3.4 (L) 3.5 - 5.1 mmol/L   Chloride 103 98 - 111 mmol/L   CO2 24 22 - 32 mmol/L   Glucose, Bld 294 (H) 70 - 99 mg/dL   BUN 27 (H) 6 - 20 mg/dL   Creatinine, Ser 8.01 (H) 0.44 - 1.00 mg/dL   Calcium 7.7 (L) 8.9 - 10.3 mg/dL   GFR calc non Af Amer 5 (L) >60 mL/min   GFR calc Af Amer 6 (L) >60 mL/min   Anion gap 9 5 - 15    Comment: Performed at [facility], [place] [address], [place], [place] [address]  Magnesium     Status: Abnormal   Collection Time: 07/17/18  4:38 AM  Result Value Ref Range   Magnesium 1.6 (L) 1.7 - 2.4 mg/dL    Comment: Performed at  [facility], [place] [address] [place], [place] [address]  Phosphorus     Status: None   Collection Time: 07/17/18  4:38 AM  Result Value Ref Range   Phosphorus 3.1 2.5 - 4.6 mg/dL    Comment: Performed at [place] [address] [place], [place] [address]  Glucose, capillary     Status: Abnormal   Collection Time: 07/17/18  7:21 AM  Result Value Ref Range   Glucose-Capillary 154 (H) 70 - 99 mg/dL  CBC     Status: Abnormal   Collection Time: 07/17/18  7:41 AM  Result Value Ref Range   WBC 17.7 (H) 4.0 - 10.5 K/uL   RBC 2.70 (L) 3.87 - 5.11 MIL/uL   Hemoglobin 8.0 (L) 12.0 - 15.0 g/dL   HCT 23.5 (L) 36.0 - 46.0 %   MCV 87.0 80.0 - 100.0 fL   MCH 29.6 26.0 - 34.0 pg   MCHC 34.0 30.0 - 36.0 g/dL   RDW 15.8 (H) 11.5 - 15.5 %   Platelets 259 150 - 400 K/uL   nRBC 0.0 0.0 - 0.2 %    Comment: Performed at [facility], [place] [address] [place], [place] [address]  Glucose,  capillary     Status: None   Collection Time: 07/17/18 11:04 AM  Result Value Ref Range   Glucose-Capillary 90 70 - 99 mg/dL  Iron and TIBC     Status: Abnormal   Collection Time: 07/17/18  2:10 PM  Result Value Ref Range   Iron 20 (L) 28 - 170 ug/dL   TIBC 161 (L) 250 - 450 ug/dL   Saturation Ratios 12 10.4 - 31.8 %   UIBC 141 ug/dL    Comment: Performed at [facility], [place] [address], [place], [place] [address]  Glucose, capillary     Status: Abnormal   Collection Time: 07/17/18  3:35 PM  Result Value Ref Range   Glucose-Capillary 122 (H) 70 - 99 mg/dL  Type and screen [place]     Status: None   Collection Time: 07/17/18 11:30 PM  Result Value Ref Range   ABO/RH(D) O POS    Antibody Screen NEG    Sample Expiration      07/20/2018 Performed at [facility], [place] [address] [place],  [address]   CBC with Differential     Status: Abnormal   Collection Time: 07/17/18 11:39 PM  Result Value Ref Range   WBC 24.8 (H) 4.0 - 10.5 K/uL   RBC 2.55 (L) 3.87 - 5.11 MIL/uL   Hemoglobin 7.5 (L) 12.0 - 15.0 g/dL   HCT 22.4 (L) 36.0 - 46.0 %   MCV 87.8 80.0 - 100.0 fL   MCH 29.4 26.0 - 34.0 pg   MCHC 33.5 30.0 - 36.0 g/dL   RDW 15.8 (H) 11.5 - 15.5 %   Platelets 269 150 - 400 K/uL   nRBC 0.0 0.0 - 0.2 %   Neutrophils Relative % 85 %   Neutro Abs 21.1 (H) 1.7 - 7.7 K/uL   Lymphocytes Relative 5 %   Lymphs Abs 1.3 0.7 - 4.0 K/uL   Monocytes Relative 6 %   Monocytes Absolute 1.5 (H) 0.1 - 1.0 K/uL   Eosinophils Relative 3 %   Eosinophils Absolute 0.7 (H) 0.0 - 0.5 K/uL   Basophils Relative 0 %   Basophils Absolute 0.1 0.0 - 0.1 K/uL   Immature Granulocytes 1 %  Abs Immature Granulocytes 0.19 (H) 0.00 - 0.07 K/uL    Comment: Performed at [facility], [place] [address] [place], [place] [address]  Renal function panel     Status: Abnormal   Collection Time: 07/18/18 12:14 AM  Result Value Ref Range   Sodium 135 135 - 145 mmol/L    Potassium 5.5 (H) 3.5 - 5.1 mmol/L    Comment: DELTA CHECK NOTED   Chloride 99 98 - 111 mmol/L   CO2 20 (L) 22 - 32 mmol/L   Glucose, Bld 217 (H) 70 - 99 mg/dL   BUN 28 (H) 6 - 20 mg/dL   Creatinine, Ser 8.58 (H) 0.44 - 1.00 mg/dL   Calcium 8.1 (L) 8.9 - 10.3 mg/dL   Phosphorus 2.8 2.5 - 4.6 mg/dL   Albumin 1.9 (L) 3.5 - 5.0 g/dL   GFR calc non Af Amer 5 (L) >60 mL/min   GFR calc Af Amer 6 (L) >60 mL/min   Anion gap 16 (H) 5 - 15    Comment: Performed at [facility], [place] [address], [place], [place] [address]  Magnesium     Status: None   Collection Time: 07/18/18 12:14 AM  Result Value Ref Range   Magnesium 1.9 1.7 - 2.4 mg/dL    Comment: Performed at [facility] [address], [place], [place] [address]  CBC with Differential     Status: Abnormal   Collection Time: 07/18/18  3:21 AM  Result Value Ref Range   WBC 23.3 (H) 4.0 - 10.5 K/uL   RBC 2.50 (L) 3.87 - 5.11 MIL/uL   Hemoglobin 7.0 (L) 12.0 - 15.0 g/dL   HCT 21.8 (L) 36.0 - 46.0 %   MCV 87.2 80.0 - 100.0 fL   MCH 28.0 26.0 - 34.0 pg   MCHC 32.1 30.0 - 36.0 g/dL   RDW 15.8 (H) 11.5 - 15.5 %   Platelets 259 150 - 400 K/uL   nRBC 0.0 0.0 - 0.2 %   Neutrophils Relative % 82 %   Neutro Abs 19.0 (H) 1.7 - 7.7 K/uL   Lymphocytes Relative 6 %   Lymphs Abs 1.5 0.7 - 4.0 K/uL   Monocytes Relative 8 %   Monocytes Absolute 1.8 (H) 0.1 - 1.0 K/uL   Eosinophils Relative 3 %   Eosinophils Absolute 0.7 (H) 0.0 - 0.5 K/uL   Basophils Relative 0 %   Basophils Absolute 0.1 0.0 - 0.1 K/uL   Immature Granulocytes 1 %   Abs Immature Granulocytes 0.24 (H) 0.00 - 0.07 K/uL    Comment: Performed at [facility], [place] [address], [place], [place] [address]  CBC     Status: Abnormal   Collection Time: 07/18/18  7:50 AM  Result Value Ref Range   WBC 23.7 (H) 4.0 - 10.5 K/uL   RBC 2.52 (L) 3.87 - 5.11 MIL/uL   Hemoglobin 7.2 (L) 12.0 - 15.0 g/dL   HCT 22.0 (L) 36.0 - 46.0 %   MCV 87.3 80.0 - 100.0 fL   MCH 28.6  26.0 - 34.0 pg   MCHC 32.7 30.0 - 36.0 g/dL   RDW 15.8 (H) 11.5 - 15.5 %   Platelets 275 150 - 400 K/uL   nRBC 0.0 0.0 - 0.2 %    Comment: Performed at [facility], [place] [address] [place], [place] [address]  Glucose, capillary     Status: Abnormal   Collection Time: 07/18/18  7:54 AM  Result Value Ref Range   Glucose-Capillary 264 (H) 70 - 99 mg/dL    Studies/Results: No results found.  Medications: I have reviewed the patient's current medications.  Assessment: Gastric ulcer with visible vessel treated with epinephrine, Endo Clip placement and bipolar cautery on 07/13/2018, aspirin and Plavix were resumed 3 days later when there was no further episode of ongoing bleeding. Patient had another episode of hematemesis.  End-stage renal disease on peritoneal dialysis, anemia, hypotension, CAD, CABG, MV repair, diabetes, PAD with bilateral iliac stents and right femoral AV fistula  Plan: Recommend to hold aspirin and Plavix, monitor H&H and transfuse if hemoglobin is less than 7. Start IV Protonix drip. Keep patient n.p.o..    Ronnette Juniper 07/18/2018, 9:51 AM   Pager [phone] If no answer or after 5 PM call [phone]

## 2018-07-18 NOTE — Progress Notes (Signed)
[facility] Progress Note  Subjective: states she vomited blood last night. Hb 7.2 this am. Seen by GI , asa / Plavix but back on hold and PPI gtt resumed.   Vitals:   07/18/18 0406 07/18/18 0600 07/18/18 0755 07/18/18 0812  BP: (!) 99/29 (!) 112/27 (!) 133/19 (!) 133/19  Pulse: 92  90 91  Resp: 14 16  18   Temp: 99.2 F (37.3 C)   99.5 F (37.5 C)  TempSrc: Oral   Oral  SpO2: 100%   98%  Weight:    56.2 kg  Height:        Inpatient medications: . sodium chloride   Intravenous Once  . amitriptyline  10 mg Oral QHS  . atorvastatin  10 mg Oral QHS  . [START ON 07/24/2018] darbepoetin (ARANESP) injection - NON-DIALYSIS  100 mcg Subcutaneous Q Fri-1800  . feeding supplement (NEPRO CARB STEADY)  237 mL Oral TID BM  . gabapentin  100 mg Oral TID  . gentamicin cream  1 application Topical Daily  . insulin aspart  1-5 Units Subcutaneous Q4H  . insulin NPH Human  10 Units Subcutaneous QPM  . latanoprost  1 drop Both Eyes QHS  . lipase/protease/amylase  108,000 Units Oral TID WC  . lipase/protease/amylase  72,000 Units Oral With snacks  . metoCLOPramide (REGLAN) injection  10 mg Intravenous Q6H  . multivitamin  1 tablet Oral QHS   . sodium chloride 250 mL (07/18/18 1228)  . dialysis solution 2.5% low-MG/low-CA    . erythromycin 250 mL/hr at 07/18/18 1222  . pantoprozole (PROTONIX) infusion     sodium chloride, acetaminophen, heparin, dianeal solution for CAPD/CCPD with heparin, dianeal solution for CAPD/CCPD with heparin, loperamide, promethazine  Iron/TIBC/Ferritin/ %Sat    Component Value Date/Time   IRON 20 (L) 07/17/2018 1410   TIBC 161 (L) 07/17/2018 1410   FERRITIN 28 09/25/2012 0525   IRONPCTSAT 12 07/17/2018 1410    Exam: Gen alert, no distress No jvd or bruits Chest clear bilat RRR no MRG Abd soft ntnd no mass or ascites +bs  LUQ PD cath clean exit site Ext no LE edema Neuro is alert, Ox 3 , nf      Home meds:  - losartan 50 hs/ carvedilol  6.25 bid/ clonidine 0.1 mg hs/ kdur 20 qd  - calc acetate 4 ac tid/ creon tid ac  - insulin nph 20u qd/ insulin lispro 1-10 ssi tid  - gabapentin 100 tid/ amitriptyline 10 hs/ oxycodone- acetaminophen 1 qid prn  - atorvastatin 10 hs/ clopidogrel 75/ ecasa 81 qd/     PD [facility] EDW 54 kg 6 total exchanges- 5 on the cycler 2 L fill vol 1 hr 30 min dwell time and 1 pause of 1500 mL, 4 hrs, dry day.   Assessment/ Plan: 1. Upper GIB - EGD x2 due to clotted blood in stomach. On 2nd egd 3/16 had Rx of gastric ulcers w/ stigmata of bleeding x2.  New hematemesis last night 3/20, GI saw and but asa/ plavix back on hold and resumed IV PPI gtt. Check Hb bid.  2. Anemia abl/ ckd - sp 2up prbc's on 3/15. Hb low 7's. Got esa darbe 100 ug 3/20 the q Friday. Tsat yest was 12% will start IV Fe load.  3. Hypotension - due to bleed/ anemia/ losartan x 1, resolved.  4. Volume ^ - improving, only 1kg up today, edema better. BP's stable now. Will use all 2.5% tonight.  5. ESRD on CCPD - cont  PD nightly 6. CAD hx CABG/ MV repair 7. HTN - bp's soft, holding BP meds 8. HL 9. DM2 on insulin 10. Hypomagnesemia - very low 1.1, got 6gm IV Magnesium last 2 days. Up to 1.9 today. 11. Hyperkalemia - overcorrected the low K+.  K + 5.5 today. Lokelma x 1. Will come down w/ PD and renal diet as well.    [facility] 07/18/2018, 12:58 PM  Recent Labs  Lab 07/12/18 1558 07/13/18 0616  07/17/18 0438 07/18/18 0014  NA 134* 138   < > 136 135  K 3.0* 3.2*   < > 3.4* 5.5*  CL 94* 105   < > 103 99  CO2 27 21*   < > 24 20*  GLUCOSE 335* 136*   < > 294* 217*  BUN 32* 43*   < > 27* 28*  CREATININE 9.76* 9.39*   < > 8.01* 8.58*  CALCIUM 8.1* 7.3*   < > 7.7* 8.1*  PHOS  --   --    < > 3.1 2.8  ALBUMIN 1.7* 1.9*  --   --  1.9*  INR 1.3*  --   --   --   --    < > = values in this interval not displayed.   Recent Labs  Lab 07/12/18 1558 07/13/18 0616  AST 22 25  ALT 5 8  ALKPHOS 228* 123   BILITOT 0.5 0.6  PROT 5.9* 4.5*   Recent Labs  Lab 07/17/18 2339 07/18/18 0321 07/18/18 0750  WBC 24.8* 23.3* 23.7*  NEUTROABS 21.1* 19.0*  --   HGB 7.5* 7.0* 7.2*  HCT 22.4* 21.8* 22.0*  MCV 87.8 87.2 87.3  PLT 269 259 275

## 2018-07-18 NOTE — Progress Notes (Addendum)
   Subjective: Experienced an episode of hematemesis overnight. Evaluated by our night float team. PPI changed to IV Protonix BID and ASA and plavix stopped. CBC overnight with hemoglobin down trending, 7.0. This morning she continues to feel nauseous but denies further hematemesis. Diarrhea has resolved.   Objective:  Vital signs in last 24 hours: Vitals:   07/17/18 2210 07/18/18 0406 07/18/18 0600 07/18/18 0755  BP: 121/61 (!) 99/29 (!) 112/27 (!) 133/19  Pulse:  92  90  Resp: 14 14 16    Temp:  99.2 F (37.3 C)    TempSrc:  Oral    SpO2:  100%    Weight:      Height:       Physical Exam Constitutional: NAD, appears comfortable Cardiovascular: RRR, no murmurs, rubs, or gallops.  Pulmonary/Chest: CTAB, no wheezes, rales, or rhonchi. Abdominal: Soft, non tender, non distended. +BS.  Extremities: Warm and well perfused. No edema.  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 Stabilized with 2 units of pRBC and 1unit of platelets. Weaned from pressors and transferred out of the ICU on 3/20. Unfortunately developed recurrent hematemesis overnight, hemoglobin trending back down, 7.0. Hemodynamically stable. No further episodes of bleeding.  -- Make NPO -- Hold ASA & plavix -- Change IV PPI BID to Protonix drip  -- CBC q12h -- Re consult GI; appreciate recommendations -- Erythromycin IV x 1    Pancreatic Insuffiencey: Hx Pancreas transplant January of 2012. 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 (LIMA to LAD, SVG to PDA in 2016 by Dr. Roxan Hockey). During his procedure in 2016, she also had mitral valve repair with for severe MR and PFO closure. -- Antihypertensives on hold with soft BPs  (Coreg, losartan, clonidine), recently weaned from pressors, now with recurrent UGIB  Type I DM: Prescribed NPH 20 units QHS, Humalog 1-10 units TID AC at home. Diabetic coordinator consulted - appreciate recommendations. Unfortunately did not see note from yesterday or change patient's insulin. She is now hyperglycemic but recently made NPO.  -- Reduce NPH to 10 units @ 6pm and adjust SSI as recommended   Dispo: Anticipated discharge pending stabilization of GI bleed.   Velna Ochs, MD 07/18/2018, 8:20 AM Pager: [phone]

## 2018-07-19 ENCOUNTER — Inpatient Hospital Stay (HOSPITAL_COMMUNITY): Payer: Medicare Other | Admitting: Certified Registered Nurse Anesthetist

## 2018-07-19 ENCOUNTER — Encounter (HOSPITAL_COMMUNITY)
Admission: EM | Disposition: A | Discharge status: Home Health | Payer: Self-pay | Source: Home / Self Care | Attending: Internal Medicine

## 2018-07-19 ENCOUNTER — Encounter (HOSPITAL_COMMUNITY): Payer: Self-pay | Admitting: *Deleted

## 2018-07-19 DIAGNOSIS — Z8774 Personal history of (corrected) congenital malformations of heart and circulatory system: Secondary | ICD-10-CM

## 2018-07-19 DIAGNOSIS — Z9483 Pancreas transplant status: Secondary | ICD-10-CM

## 2018-07-19 DIAGNOSIS — S31103A Unspecified open wound of abdominal wall, right lower quadrant without penetration into peritoneal cavity, initial encounter: Secondary | ICD-10-CM

## 2018-07-19 DIAGNOSIS — X58XXXA Exposure to other specified factors, initial encounter: Secondary | ICD-10-CM

## 2018-07-19 HISTORY — PX: HOT HEMOSTASIS: SHX5433

## 2018-07-19 HISTORY — PX: SCLEROTHERAPY: SHX6841

## 2018-07-19 HISTORY — PX: ESOPHAGOGASTRODUODENOSCOPY (EGD) WITH PROPOFOL: SHX5813

## 2018-07-19 LAB — CBC
HCT: 21.5 % — ABNORMAL LOW (ref 36.0–46.0)
HCT: 23.3 % — ABNORMAL LOW (ref 36.0–46.0)
Hemoglobin: 7.2 g/dL — ABNORMAL LOW (ref 12.0–15.0)
Hemoglobin: 7.5 g/dL — ABNORMAL LOW (ref 12.0–15.0)
MCH: 29.6 pg (ref 26.0–34.0)
MCH: 28.4 pg (ref 26.0–34.0)
MCHC: 33.5 g/dL (ref 30.0–36.0)
MCHC: 32.2 g/dL (ref 30.0–36.0)
MCV: 88.5 fL (ref 80.0–100.0)
MCV: 88.3 fL (ref 80.0–100.0)
Platelets: 285 10*3/uL (ref 150–400)
Platelets: 308 10*3/uL (ref 150–400)
RBC: 2.43 MIL/uL — ABNORMAL LOW (ref 3.87–5.11)
RBC: 2.64 MIL/uL — ABNORMAL LOW (ref 3.87–5.11)
RDW: 15.8 % — ABNORMAL HIGH (ref 11.5–15.5)
RDW: 15.8 % — ABNORMAL HIGH (ref 11.5–15.5)
WBC: 26.3 10*3/uL — ABNORMAL HIGH (ref 4.0–10.5)
WBC: 30.2 10*3/uL — ABNORMAL HIGH (ref 4.0–10.5)
nRBC: 0.1 % (ref 0.0–0.2)
nRBC: 0.2 % (ref 0.0–0.2)

## 2018-07-19 LAB — BASIC METABOLIC PANEL
Anion gap: 12 (ref 5–15)
BUN: 24 mg/dL — ABNORMAL HIGH (ref 6–20)
CO2: 23 mmol/L (ref 22–32)
Calcium: 7.6 mg/dL — ABNORMAL LOW (ref 8.9–10.3)
Chloride: 101 mmol/L (ref 98–111)
Creatinine, Ser: 7.84 mg/dL — ABNORMAL HIGH (ref 0.44–1.00)
GFR calc Af Amer: 6 mL/min — ABNORMAL LOW (ref >60)
GFR calc non Af Amer: 6 mL/min — ABNORMAL LOW (ref >60)
Glucose, Bld: 145 mg/dL — ABNORMAL HIGH (ref 70–99)
Potassium: 4 mmol/L (ref 3.5–5.1)
Sodium: 136 mmol/L (ref 135–145)

## 2018-07-19 LAB — GLUCOSE, CAPILLARY
GLUCOSE-CAPILLARY: 95 mg/dL (ref 70–99)
Glucose-Capillary: 103 mg/dL — ABNORMAL HIGH (ref 70–99)
Glucose-Capillary: 124 mg/dL — ABNORMAL HIGH (ref 70–99)
Glucose-Capillary: 128 mg/dL — ABNORMAL HIGH (ref 70–99)
Glucose-Capillary: 158 mg/dL — ABNORMAL HIGH (ref 70–99)
Glucose-Capillary: 163 mg/dL — ABNORMAL HIGH (ref 70–99)
Glucose-Capillary: 169 mg/dL — ABNORMAL HIGH (ref 70–99)
Glucose-Capillary: 219 mg/dL — ABNORMAL HIGH (ref 70–99)
Glucose-Capillary: 221 mg/dL — ABNORMAL HIGH (ref 70–99)

## 2018-07-19 LAB — MAGNESIUM: Magnesium: 1.8 mg/dL (ref 1.7–2.4)

## 2018-07-19 SURGERY — ESOPHAGOGASTRODUODENOSCOPY (EGD) WITH PROPOFOL
Anesthesia: Monitor Anesthesia Care

## 2018-07-19 MED ORDER — PROPOFOL 500 MG/50ML IV EMUL
INTRAVENOUS | Status: DC | PRN
  Administered 2018-07-19: 100 ug/kg/min via INTRAVENOUS

## 2018-07-19 MED ORDER — PHENYLEPHRINE 40 MCG/ML (10ML) SYRINGE FOR IV PUSH (FOR BLOOD PRESSURE SUPPORT)
PREFILLED_SYRINGE | INTRAVENOUS | Status: DC | PRN
  Administered 2018-07-19: 120 ug via INTRAVENOUS
  Administered 2018-07-19: 80 ug via INTRAVENOUS
  Administered 2018-07-19: 120 ug via INTRAVENOUS

## 2018-07-19 MED ORDER — SODIUM CHLORIDE 0.9 % IV SOLN
2.0000 g | INTRAVENOUS | Status: DC
  Administered 2018-07-19 – 2018-07-21 (×3): 2 g via INTRAVENOUS
  Filled 2018-07-19 (×3): qty 20

## 2018-07-19 MED ORDER — BUTAMBEN-TETRACAINE-BENZOCAINE 2-2-14 % EX AERO
INHALATION_SPRAY | CUTANEOUS | Status: DC | PRN
  Administered 2018-07-19: 2 via TOPICAL

## 2018-07-19 MED ORDER — HEPARIN 1000 UNIT/ML FOR PERITONEAL DIALYSIS: 500.0000 [IU] | INTRAMUSCULAR | Status: DC | PRN

## 2018-07-19 MED ORDER — HEPARIN 1000 UNIT/ML FOR PERITONEAL DIALYSIS
INTRAPERITONEAL | Status: DC | PRN
  Filled 2018-07-19: qty 5000

## 2018-07-19 MED ORDER — SODIUM CHLORIDE 0.9 % IV SOLN: INTRAVENOUS | Status: DC

## 2018-07-19 MED ORDER — SODIUM CHLORIDE 0.9 % IV SOLN
INTRAVENOUS | Status: DC | PRN
  Administered 2018-07-19: 08:00:00 via INTRAVENOUS

## 2018-07-19 MED ORDER — DELFLEX-LC/2.5% DEXTROSE 394 MOSM/L IP SOLN
INTRAPERITONEAL | Status: DC
  Administered 2018-07-19: 5000 mL via INTRAPERITONEAL

## 2018-07-19 MED ORDER — POTASSIUM CHLORIDE CRYS ER 20 MEQ PO TBCR
20.0000 meq | EXTENDED_RELEASE_TABLET | Freq: Every day | ORAL | Status: DC
  Administered 2018-07-19 – 2018-07-24 (×6): 20 meq via ORAL
  Filled 2018-07-19 (×7): qty 1

## 2018-07-19 MED ORDER — PROPOFOL 10 MG/ML IV BOLUS
INTRAVENOUS | Status: DC | PRN
  Administered 2018-07-19 (×2): 20 mg via INTRAVENOUS

## 2018-07-19 MED ORDER — PANTOPRAZOLE SODIUM 40 MG PO TBEC
40.0000 mg | DELAYED_RELEASE_TABLET | Freq: Two times a day (BID) | ORAL | Status: DC
  Administered 2018-07-19 – 2018-07-24 (×10): 40 mg via ORAL
  Filled 2018-07-19 (×10): qty 1

## 2018-07-19 MED ORDER — SODIUM CHLORIDE (PF) 0.9 % IJ SOLN
PREFILLED_SYRINGE | INTRAMUSCULAR | Status: DC | PRN
  Administered 2018-07-19: 5 mL

## 2018-07-19 MED ORDER — GENTAMICIN SULFATE 0.1 % EX CREA
1.0000 "application " | TOPICAL_CREAM | Freq: Every day | CUTANEOUS | Status: DC
  Administered 2018-07-19 – 2018-07-21 (×3): 1 via TOPICAL
  Filled 2018-07-19: qty 15

## 2018-07-19 SURGICAL SUPPLY — 15 items

## 2018-07-19 NOTE — Progress Notes (Addendum)
[facility] Progress Note   Subjective: Says she feels "a little weak" when sitting up but otherwise OK. Used 2.5% solutions last night. No issues with PD. Went to endo this AM.   Objective Vitals:   07/19/18 0818 07/19/18 0859 07/19/18 0900 07/19/18 0910  BP: (!) 109/25  (!) 104/17 (!) 107/26  Pulse: 91 94 95 95  Resp: 16 (!) 21 (!) 27 (!) 23  Temp: 98.9 F (37.2 C)  97.7 F (36.5 C)   TempSrc: Oral Oral Oral   SpO2: 97% 100% 100% 99%  Weight:      Height:       Physical Exam General: Pleasant, NAD Heart: S1,S2, RRR Lungs: CTAB A/P Abdomen: Active BS, S, NT Extremities: Trace BLE Dialysis Access: LUQ PD cath, drsg CDI.   Additional Objective Labs: Basic Metabolic Panel: Recent Labs  Lab 07/16/18 0858  07/17/18 0438 07/18/18 0014 07/19/18 0646  NA 137  --  136 135 136  K 2.9*  --  3.4* 5.5* 4.0  CL 104  --  103 99 101  CO2 24  --  24 20* 23  GLUCOSE 70   < > 294* 217* 145*  BUN 30*  --  27* 28* 24*  CREATININE 8.05*  --  8.01* 8.58* 7.84*  CALCIUM 7.4*  --  7.7* 8.1* 7.6*  PHOS 3.3  --  3.1 2.8  --    < > = values in this interval not displayed.   Liver Function Tests: Recent Labs  Lab 07/12/18 1558 07/13/18 0616 07/18/18 0014  AST 22 25  --   ALT 5 8  --   ALKPHOS 228* 123  --   BILITOT 0.5 0.6  --   PROT 5.9* 4.5*  --   ALBUMIN 1.7* 1.9* 1.9*   Recent Labs  Lab 07/12/18 1558  LIPASE 54*   CBC: Recent Labs  Lab 07/16/18 0858  07/17/18 2339 07/18/18 0321 07/18/18 0750 07/18/18 1858 07/19/18 0646  WBC 14.9*   < > 24.8* 23.3* 23.7* 27.6* 26.3*  NEUTROABS 11.5*  --  21.1* 19.0*  --   --   --   HGB 7.8*   < > 7.5* 7.0* 7.2* 7.9* 7.2*  HCT 24.0*   < > 22.4* 21.8* 22.0* 24.5* 21.5*  MCV 87.6   < > 87.8 87.2 87.3 88.1 88.5  PLT 242   < > 269 259 275 297 285   < > = values in this interval not displayed.   Blood Culture    Component Value Date/Time   SDES URINE, CLEAN CATCH 09/24/2012 2007   SPECREQUEST NONE 09/24/2012  2007   CULT YEAST 09/24/2012 2007   REPTSTATUS 09/26/2012 FINAL 09/24/2012 2007    Cardiac Enzymes: No results for input(s): CKTOTAL, CKMB, CKMBINDEX, TROPONINI in the last 168 hours. CBG: Recent Labs  Lab 07/18/18 1628 07/18/18 2148 07/19/18 0010 07/19/18 0429 07/19/18 0751  GLUCAP 97 165* 169* 158* 124*   Iron Studies:  Recent Labs    07/17/18 1410  IRON 20*  TIBC 161*   @[email or web address] @ Studies/Results: No results found. Medications: . sodium chloride 10 mL/hr at 07/18/18 1542  . dialysis solution 2.5% low-MG/low-CA    . erythromycin 250 mL/hr at 07/18/18 1222  . [START ON 07/20/2018] ferric gluconate (FERRLECIT/NULECIT) IV     . sodium chloride   Intravenous Once  . amitriptyline  10 mg Oral QHS  . atorvastatin  10 mg Oral QHS  . [START ON 07/24/2018] darbepoetin (ARANESP) injection - NON-DIALYSIS  100 mcg Subcutaneous Q Fri-1800  . feeding supplement (NEPRO CARB STEADY)  237 mL Oral TID BM  . gabapentin  100 mg Oral TID  . gentamicin cream  1 application Topical Daily  . insulin aspart  1-5 Units Subcutaneous Q4H  . insulin NPH Human  10 Units Subcutaneous QPM  . latanoprost  1 drop Both Eyes QHS  . lipase/protease/amylase  108,000 Units Oral TID WC  . lipase/protease/amylase  72,000 Units Oral With snacks  . multivitamin  1 tablet Oral QHS  . pantoprazole  40 mg Oral BID AC     PD [facility] EDW 54 kg 6 total exchanges- 5 on the cycler 2 L fill vol 1 hr 30 min dwell time and 1 pause of 1500 mL, 4 hrs, dry day.   Assessment/ Plan: 1. Upper GIB - EGD x2 due to clotted blood in stomach. On 2nd egd 3/16 had Rx of gastric ulcers w/ stigmata of bleeding x2.  New hematemesis 3/20. Seen by GI, asa/ plavix held and resumed IV PPI gtt. To endo today 3/22 had oozing gastric ulcer; epi injected and bicap used per GI.  2. Anemia abl/ ckd - sp 2up prbc's on 3/15. Hb low 7's. Got esa darbe 100 ug 3/20 the q Friday. Tsat yest was 12% so started IV Fe load.  3. Hypotension - due  to bleed/ anemia. BP's better. BP meds on hold.   4. Volume ^ - Standing wt 56.8 kg. Up 1-2kg.  BP still on soft side but improving. Will use all 2.5% again tonight.  5. ESRD on CCPD - cont PD nightly 6. CAD hx CABG/ MV repair 7. HTN - holding BP meds 8. DM2 on insulin 9. Hypomagnesemia - very low 1.1, got 6gm IV Mg 3/19-20, level better 10. Hyperkalemia - K+ down today 4.0.  Resume 20 mEq / day po.   Rita H. Brown NP-C 07/19/2018, 10:23 AM  [facility] [phone]  Pt seen, examined and agree w A/P as above.  [facility] 07/19/2018, 2:08 PM

## 2018-07-19 NOTE — Progress Notes (Signed)
Subjective: Priscilla [name] was just getting back from endoscopy, she reports that she tolerated this well and that the GI physician discussed the results with her already. She denied any hematemesis or other signs of bleeding since 2 nights ago. She does report that she is concerned about her right groin wound and thinks that it is infected. We discussed that we are having vascular come and evaluate and that we will be starting her on some antibiotics. We discussed that we will be hold ASA and plavix for now. She is in agreement with the plan.   Objective:  Vital signs in last 24 hours: Vitals:   07/18/18 1438 07/18/18 1755 07/18/18 2011 07/19/18 0431  BP: (!) 84/58 (!) 124/27  (!) 121/48  Pulse:  97 93 93  Resp:  18 16 16   Temp: 99.2 F (37.3 C) 99.2 F (37.3 C) 98 F (36.7 C) 99.8 F (37.7 C)  TempSrc: Oral Oral Oral Oral  SpO2: 97% 97% 100% 98%  Weight:  58.7 kg    Height:        General: Tired appearing female, NAD, resting comfortable Cardiac: RRR, no m/r/g Pulmonary: CTABL, no wheezing or rhonchi Abdomen: Soft, non-tender to palpation, +BS, right groin area has surgical wound with some purulent material that is foul smelling, no erythema or edema around the area Extremity: No LE edema  Assessment/Plan:  Principal Problem:   GI bleed Active Problems:   ESRD (end stage renal disease) (HCC)   Diabetes mellitus type 1 (HCC)   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 HSe received epineprhine injection, cautery, and endo clip placement, she ahs received 2 units pRBC and 1 unit of platelets. Transferred out of ICU on 3/20. She had been restarted on her ASA and plavix since she had no evidence of bleeding for 3  days however had recurrence of hematemesis the night on 3/20. Hgb has been stable. GI took her for endoscopy today, it showed the non-bleeding gastric ulcer with previously placed 2 endoclips with no active bleeding, with localized severely erythematous mucosa with bleeding in the gastric body, injected with epinephrine for hemostasis and coagulation with bipolar probe, and a few non-bleeding gastric ulcers with clean base.  -GI consulted, appreciate recommendations -Hold ASA and plavix x 24 hours -Resume regular diet -CBC daily -Continue protonix BID -S/p erythromycin IV x1  Right groin wound: S/p bilateral iliac stents on 09/19/2017, found to have a right groin AVF and underwent repair on 07/07/18. She has some purulent, foul smelling drainage out of her wound, no erythema or edema around the area. Vascular was consulted, they probed and opened wound to facilitate drainage, cultures sent, and they recommended continuing antibiotics.  -Ceftriaxone  -Wet to dry dressings -Vascular following, appreciate recommendations -F/u culture results  Pancreatic insufficiency: -Patient has a history of pancreas transplant in Jan 2012, she is on Creon at home for this. She denies any diarrhea.  -Continue Creon  ESRD on PD: -Nephrology following, appreciate recommendations -Continue PD overnight  PVD: Patient has had bilateral common iliac artery angioplasty and stenting, she is on ASA and plavix for this.  -Holding ASA and plavix due to GI bleed, appreciate GI recommendations -Restart medications tomorrow  Ischemic cardiomyopathy Hx  severe MR s/p mitral valve repair Hx PFO s/p closure HTN:  S/p CABG x2. She is on coreg, lorartan, and clonidine at home. These were held due to her UGIB causing hypotension requiring pressors. Continue to monitor and restart as needed.   DM type 1: -She is on NPH 20 units QHS and humalog 1-10 units TID AC at home.  -Decreased NPH to 10 units yesterday, she is  currently NPO  FEN: No cc/hr, replete lytes prn, NPO VTE ppx: Lovenox Code Status: FULL    Dispo: Anticipated discharge is pending stabilization of GI bleed.   Asencion Noble, MD 07/19/2018, 6:45 AM Pager: [phone]

## 2018-07-19 NOTE — Anesthesia Preprocedure Evaluation (Addendum)
Anesthesia Evaluation  Patient identified by MRN, date of birth, ID band Patient awake    Reviewed: Allergy & Precautions, NPO status , Patient's Chart, lab work & pertinent test results  Airway Mallampati: II  TM Distance: >3 FB Neck ROM: Full    Dental  (+) Teeth Intact, Dental Advisory Given   Pulmonary    breath sounds clear to auscultation       Cardiovascular hypertension,  Rhythm:Regular Rate:Normal     Neuro/Psych    GI/Hepatic   Endo/Other  diabetes  Renal/GU      Musculoskeletal   Abdominal   Peds  Hematology   Anesthesia Other Findings   Reproductive/Obstetrics                            Anesthesia Physical Anesthesia Plan  ASA: III  Anesthesia Plan: MAC   Post-op Pain Management:    Induction: Intravenous  PONV Risk Score and Plan: Ondansetron and Propofol infusion  Airway Management Planned: Natural Airway and Nasal Cannula  Additional Equipment:   Intra-op Plan:   Post-operative Plan:   Informed Consent: I have reviewed the patients History and Physical, chart, labs and discussed the procedure including the risks, benefits and alternatives for the proposed anesthesia with the patient or authorized representative who has indicated his/her understanding and acceptance.       Plan Discussed with: CRNA and Anesthesiologist  Anesthesia Plan Comments:         Anesthesia Quick Evaluation

## 2018-07-19 NOTE — Op Note (Signed)
[facility] Patient Name: Priscilla [name] Procedure Date : 07/19/2018 MRN: [id number] Attending MD: Ronnette Juniper , MD Date of Birth: 10/12/1970 CSN: [id number] Age: 48 Admit Type: Inpatient Procedure:                Upper GI endoscopy Indications:              Hematemesis after resuming ASA and Plavix, gastric                            ulcer was recently treated with endoclip placement                            and cautery Providers:                Ronnette Juniper, MD, Debara Pickett., Technician, Clearnce Sorrel, CRNA Referring MD:              Medicines:                Monitored Anesthesia Care Complications:            No immediate complications. Estimated blood loss:                            None. Estimated Blood Loss:     Estimated blood loss: none. Procedure:                Pre-Anesthesia Assessment:                           - Prior to the procedure, a History and Physical                            was performed, and patient medications and                            allergies were reviewed. The patient's tolerance of                            previous anesthesia was also reviewed. The risks                            and benefits of the procedure and the sedation                            options and risks were discussed with the patient.                            All questions were answered, and informed consent                            was obtained. Prior Anticoagulants: The patient has                            taken  no previous anticoagulant or antiplatelet                            agents. After reviewing the risks and benefits, the                            patient was deemed in satisfactory condition to                            undergo the procedure.                           - Prior to the procedure, a History and Physical                            was performed, and patient medications and                            allergies were reviewed. The patient's tolerance of                            previous anesthesia was also reviewed. The risks                            and benefits of the procedure and the sedation                            options and risks were discussed with the patient.                            All questions were answered, and informed consent                            was obtained. Prior Anticoagulants: The patient has                            taken Plavix (clopidogrel), last dose was 1 day                            prior to procedure. ASA Grade Assessment: III - A                            patient with severe systemic disease. After                            reviewing the risks and benefits, the patient was                            deemed in satisfactory condition to undergo the                            procedure.  After obtaining informed consent, the endoscope was                            passed under direct vision. Throughout the                            procedure, the patient's blood pressure, pulse, and                            oxygen saturations were monitored continuously. The                            GIF-H190 ([id number]) Olympus gastroscope was                            introduced through the mouth, and advanced to the                            second part of duodenum. The upper GI endoscopy was                            accomplished without difficulty. The patient                            tolerated the procedure well. Scope In: Scope Out: Findings:      The examined esophagus was normal.      The Z-line was regular and was found 38 cm from the incisors.      One non-bleeding pigmented gastric ulcer with previoulsy placed 2       endoclips was found on the greater curvature of the stomach. It appeared       to be healing with no evidence of active bleeding from that site.      Localized severely  erythematous mucosa with bleeding was found in the       gastric body proximal to the gastric ulcer. This area was successfully       injected with 5 mL of a 1:10,000 solution of epinephrine for hemostasis.       Coagulation for hemostasis using bipolar probe was successful.      Few non-bleeding superficial gastric ulcers with a clean ulcer base       (Forrest Class III) were found on the greater curvature of the stomach,       in the gastric antrum and at the pylorus.      The examined duodenum was normal.      The cardia and gastric fundus were normal on retroflexion. Impression:               - Normal esophagus.                           - Z-line regular, 38 cm from the incisors.                           - Non-bleeding gastric ulcer with pigmented  material.Previously placed endoclips in place.                           - Erythematous mucosa with bleeding in the gastric                            body. Injected. Treated with bipolar cautery.                           - Non-bleeding gastric ulcers with a clean ulcer                            base (Forrest Class III).                           - Normal examined duodenum.                           - No specimens collected. Moderate Sedation:      Patient did not receive moderate sedation for this procedure, but       instead received monitored anesthesia care. Recommendation:           - Resume regular diet.                           - Use Protonix (pantoprazole) 40 mg PO BID.                           - Recommend to hold ASA and Plavix for at least                            another 24 hours. Procedure Code(s):        --- Professional ---                           [phone], Esophagogastroduodenoscopy, flexible,                            transoral; with control of bleeding, any method Diagnosis Code(s):        --- Professional ---                           K25.9, Gastric ulcer, unspecified as acute or                             chronic, without hemorrhage or perforation                           K31.89, Other diseases of stomach and duodenum                           K92.0, Hematemesis CPT copyright 2018 American Medical Association. All rights reserved. The codes documented in this report are preliminary and upon coder review may  be revised to meet current compliance requirements. Ronnette Juniper, MD 07/19/2018 9:03:11 AM This report has been signed electronically. Number of Addenda: 0

## 2018-07-19 NOTE — Anesthesia Postprocedure Evaluation (Signed)
Anesthesia Post Note  Patient: Priscilla [name]  Procedure(s) Performed: ESOPHAGOGASTRODUODENOSCOPY (EGD) WITH PROPOFOL (N/A ) SCLEROTHERAPY HOT HEMOSTASIS (ARGON PLASMA COAGULATION/BICAP) (N/A )     Patient location during evaluation: Endoscopy Anesthesia Type: MAC Level of consciousness: awake and alert Pain management: pain level controlled Vital Signs Assessment: post-procedure vital signs reviewed and stable Respiratory status: spontaneous breathing, nonlabored ventilation, respiratory function stable and patient connected to nasal cannula oxygen Cardiovascular status: stable and blood pressure returned to baseline Postop Assessment: no apparent nausea or vomiting Anesthetic complications: no    Last Vitals:  Vitals:   07/19/18 1926 07/19/18 2037  BP: 101/65 109/63  Pulse: 100 (!) 101  Resp:    Temp: 36.8 C 36.6 C  SpO2: 98% 100%    Last Pain:  Vitals:   07/19/18 2037  TempSrc: Oral  PainSc:                  [place] Winslow COKER

## 2018-07-19 NOTE — Interval H&P Note (Signed)
History and Physical Interval Note: 47/female with recent UGI bleeding from gastric ulcer, treated with endoclips, epinephrine and cautery, had another episode of hematemesis, for a repeat EGD today.  07/19/2018 8:24 AM  Priscilla [name]  has presented today for EGD, with the diagnosis of hemetamesis.  The various methods of treatment have been discussed with the patient and family. After consideration of risks, benefits and other options for treatment, the patient has consented to  Procedure(s): ESOPHAGOGASTRODUODENOSCOPY (EGD) WITH PROPOFOL (N/A) as a surgical intervention.  The patient's history has been reviewed, patient examined, no change in status, stable for surgery.  I have reviewed the patient's chart and labs.  Questions were answered to the patient's satisfaction.     Ronnette Juniper

## 2018-07-19 NOTE — Transfer of Care (Signed)
Immediate Anesthesia Transfer of Care Note  Patient: Priscilla [name]  Procedure(s) Performed: ESOPHAGOGASTRODUODENOSCOPY (EGD) WITH PROPOFOL (N/A ) SCLEROTHERAPY HOT HEMOSTASIS (ARGON PLASMA COAGULATION/BICAP) (N/A )  Patient Location: Endoscopy Unit  Anesthesia Type:MAC  Level of Consciousness: awake, alert  and oriented  Airway & Oxygen Therapy: Patient Spontanous Breathing and Patient connected to nasal cannula oxygen  Post-op Assessment: Report given to RN and Post -op Vital signs reviewed and stable  Post vital signs: Reviewed and stable  Last Vitals:  Vitals Value Taken Time  BP 113/12 07/19/2018  8:59 AM  Temp    Pulse 95 07/19/2018  9:01 AM  Resp 26 07/19/2018  9:01 AM  SpO2 100 % 07/19/2018  9:01 AM  Vitals shown include unvalidated device data.  Last Pain:  Vitals:   07/19/18 0818  TempSrc: Oral  PainSc: 0-No pain      Patients Stated Pain Goal: 0 (50/38/88 2800)  Complications: No apparent anesthesia complications

## 2018-07-19 NOTE — Brief Op Note (Signed)
07/12/2018 - 07/19/2018  9:03 AM  PATIENT:  Priscilla [name]  48 y.o. female  PRE-OPERATIVE DIAGNOSIS:  hemetamesis  POST-OPERATIVE DIAGNOSIS:  oozing gastric ulcer; epi injected and bicap used  PROCEDURE:  Procedure(s): ESOPHAGOGASTRODUODENOSCOPY (EGD) WITH PROPOFOL (N/A) SCLEROTHERAPY HOT HEMOSTASIS (ARGON PLASMA COAGULATION/BICAP) (N/A)  SURGEON:  Surgeon(s) and Role:    Ronnette Juniper, MD - Primary  PHYSICIAN ASSISTANT:   ASSISTANTS: Angus Seller, RN, Elspeth Cho, Tech   ANESTHESIA:   MAC  EBL:  Minimal  BLOOD ADMINISTERED:none  DRAINS: none   LOCAL MEDICATIONS USED:  OTHER None  SPECIMEN:  No Specimen  DISPOSITION OF SPECIMEN:  N/A  COUNTS:  YES  TOURNIQUET:  * No tourniquets in log *  DICTATION: .Dragon Dictation  PLAN OF CARE: Admit to inpatient   PATIENT DISPOSITION:  PACU - hemodynamically stable.   Delay start of Pharmacological VTE agent (>24hrs) due to surgical blood loss or risk of bleeding: yes

## 2018-07-19 NOTE — Progress Notes (Signed)
[facility]  Patient name: Priscilla [name] MRN: [id number] DOB: 14-Oct-1970 Sex: female     HISOTRY OF PRESENT ILLNESS:    Priscilla [name] is a 48 y.o. female who is status post bilateral iliac stents on 09-19-2017 for rest pain and a left heel wound.  She was found to have a right groin AVF and underwent repair on 07-07-2018.  The defects were repaired with prolene suture.  She was admitted for a GI bleed and was found to have drainage from her right groin incision   PAST MEDICAL HISTORY:   Past Medical History:  Diagnosis Date  . Anemia   . CAD (coronary artery disease)   . Cardiomyopathy, ischemic 06/24/2014  . Diabetes mellitus without complication ([place])   . End stage renal disease ([place])   . Foot drop, left foot   . GERD (gastroesophageal reflux disease)   . H/O kidney transplant   . Hyperlipidemia   . Hypertension   . Myocardial infarction ([place])   . Pancreas transplanted ([place])   . Renal disorder   . 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 S/P mitral valve repair 07/18/14     FAMILY HISTORY:   Family History  Problem Relation Age of Onset  . CAD Mother   . Stroke Mother   . COPD Father   . Colon cancer Neg Hx     SOCIAL HISTORY:   Social History   Tobacco Use  . Smoking status: Never Smoker  . Smokeless tobacco: Never Used  Substance Use Topics  . Alcohol use: No    Alcohol/week: 0.0 standard drinks     ALLERGIES:   Allergies  Allergen Reactions  . Pork-Derived Products Other (See Comments)    Pt is [place]     CURRENT MEDICATIONS:   Current Facility-Administered Medications  Medication Dose Route Frequency Provider Last Rate Last Dose  . 0.9 %  sodium chloride infusion (Manually program via Guardrails IV Fluids)   Intravenous Once Ronnette Juniper, MD      . 0.9 %  sodium chloride infusion   Intravenous PRN Ronnette Juniper, MD 10 mL/hr at 07/18/18 1542    . acetaminophen (TYLENOL)  tablet 650 mg  650 mg Oral Q4H PRN Ronnette Juniper, MD      . amitriptyline (ELAVIL) tablet 10 mg  10 mg Oral QHS Ronnette Juniper, MD   10 mg at 07/18/18 2157  . atorvastatin (LIPITOR) tablet 10 mg  10 mg Oral QHS Ronnette Juniper, MD   10 mg at 07/18/18 2157  . [START ON 07/24/2018] Darbepoetin Alfa (ARANESP) injection 100 mcg  100 mcg Subcutaneous Q Fri-1800 Ronnette Juniper, MD      . dialysis solution 2.5% low-MG/low-CA dianeal solution   Intraperitoneal Q24H Ronnette Juniper, MD      . erythromycin 1,000 mg in sodium chloride 0.9 % 250 mL IVPB  1,000 mg Intravenous Once Ronnette Juniper, MD 250 mL/hr at 07/18/18 1222    . feeding supplement (NEPRO CARB STEADY) liquid 237 mL  237 mL Oral TID BM Ronnette Juniper, MD   237 mL at 07/17/18 1203  . [START ON 07/20/2018] ferric gluconate (NULECIT) 125 mg in sodium chloride 0.9 % 100 mL IVPB  125 mg Intravenous Sharman Crate, MD      . gabapentin (NEURONTIN) capsule 100 mg  100 mg Oral TID Ronnette Juniper, MD   100 mg at 07/18/18 2158  . gentamicin cream (GARAMYCIN) 0.1 % 1 application  1 application Topical Daily Ronnette Juniper,  MD   1 application at 16/10/96 1754  . heparin 1000 unit/ml injection 500 Units  500 Units Intraperitoneal PRN Ronnette Juniper, MD      . heparin 2,500 Units in dialysis solution 2.5% low-MG/low-CA 5,000 mL dialysis solution   Peritoneal Dialysis PRN Ronnette Juniper, MD      . heparin 2,500 Units in dialysis solution 4.25% low-MG/low-CA 5,000 mL dialysis solution   Peritoneal Dialysis PRN Ronnette Juniper, MD      . insulin aspart (novoLOG) injection 1-5 Units  1-5 Units Subcutaneous Q4H Ronnette Juniper, MD   1 Units at 07/19/18 [phone]  . insulin NPH Human (HUMULIN N,NOVOLIN N) injection 10 Units  10 Units Subcutaneous QPM Ronnette Juniper, MD   10 Units at 07/18/18 2157  . latanoprost (XALATAN) 0.005 % ophthalmic solution 1 drop  1 drop Both Eyes QHS Ronnette Juniper, MD   1 drop at 07/18/18 2158  . lipase/protease/amylase (CREON) capsule 108,000 Units  108,000 Units Oral TID WC Ronnette Juniper,  MD   Stopped at 07/17/18 1754  . lipase/protease/amylase (CREON) capsule 72,000 Units  72,000 Units Oral With snacks Ronnette Juniper, MD   72,000 Units at 07/17/18 1207  . loperamide (IMODIUM) capsule 2 mg  2 mg Oral Q8H PRN Ronnette Juniper, MD   2 mg at 07/16/18 2118  . multivitamin (RENA-VIT) tablet 1 tablet  1 tablet Oral QHS Ronnette Juniper, MD   1 tablet at 07/18/18 2157  . pantoprazole (PROTONIX) EC tablet 40 mg  40 mg Oral BID AC Ronnette Juniper, MD      . promethazine (PHENERGAN) injection 12.5 mg  12.5 mg Intravenous Q6H PRN Ronnette Juniper, MD   12.5 mg at 07/18/18 0434    REVIEW OF SYSTEMS:   [X]  denotes positive finding, [ ]  denotes negative finding Cardiac  Comments:  Chest pain or chest pressure:    Shortness of breath upon exertion:    Short of breath when lying flat:    Irregular heart rhythm:        Vascular    Pain in calf, thigh, or hip brought on by ambulation:    Pain in feet at night that wakes you up from your sleep:     Blood clot in your veins:    Leg swelling:         Pulmonary    Oxygen at home:    Productive cough:     Wheezing:         Neurologic    Sudden weakness in arms or legs:     Sudden numbness in arms or legs:     Sudden onset of difficulty speaking or slurred speech:    Temporary loss of vision in one eye:     Problems with dizziness:         Gastrointestinal    Blood in stool:     Vomited blood:         Genitourinary    Burning when urinating:     Blood in urine:        Psychiatric    Major depression:         Hematologic    Bleeding problems:    Problems with blood clotting too easily:        Skin    Rashes or ulcers:        Constitutional    Fever or chills:      PHYSICAL EXAM:   Vitals:   07/19/18 0818 07/19/18 0859 07/19/18 0900 07/19/18 0910  BP: Marland Kitchen)  109/25  (!) 104/17 (!) 107/26  Pulse: 91 94 95 95  Resp: 16 (!) 21 (!) 27 (!) 23  Temp: 98.9 F (37.2 C)  97.7 F (36.5 C)   TempSrc: Oral Oral Oral   SpO2: 97% 100% 100% 99%   Weight:      Height:        GENERAL: The patient is a well-nourished female, in no acute distress. The vital signs are documented above. CARDIAC: There is a regular rate and rhythm.  VASCULAR: purulent drainage from right groin PULMONARY: Non-labored respirations ABDOMEN: Soft and non-tender with normal pitched bowel sounds.  MUSCULOSKELETAL: There are no major deformities or cyanosis. NEUROLOGIC: No focal weakness or paresthesias are detected. SKIN: There are no ulcers or rashes noted. PSYCHIATRIC: The patient has a normal affect.  STUDIES:   none  MEDICAL ISSUES:   Right groin infection:  The incision was probed and ultimately opened for about 1 com to facilitate drainage.  Cultures of the wound were sent.  Will do wet to dry dressing changes BID and continue ABx.  Await culture results.    Leia Alf, MD, FACS [facility] [phone] Pager [phone]

## 2018-07-20 DIAGNOSIS — R197 Diarrhea, unspecified: Secondary | ICD-10-CM

## 2018-07-20 DIAGNOSIS — T827XXA Infection and inflammatory reaction due to other cardiac and vascular devices, implants and grafts, initial encounter: Secondary | ICD-10-CM

## 2018-07-20 LAB — CBC
HCT: 22.3 % — ABNORMAL LOW (ref 36.0–46.0)
Hemoglobin: 7.1 g/dL — ABNORMAL LOW (ref 12.0–15.0)
MCH: 28.4 pg (ref 26.0–34.0)
MCHC: 31.8 g/dL (ref 30.0–36.0)
MCV: 89.2 fL (ref 80.0–100.0)
Platelets: 335 10*3/uL (ref 150–400)
RBC: 2.5 MIL/uL — ABNORMAL LOW (ref 3.87–5.11)
RDW: 15.8 % — ABNORMAL HIGH (ref 11.5–15.5)
WBC: 23.9 10*3/uL — ABNORMAL HIGH (ref 4.0–10.5)
nRBC: 0.3 % — ABNORMAL HIGH (ref 0.0–0.2)

## 2018-07-20 LAB — BASIC METABOLIC PANEL
Anion gap: 13 (ref 5–15)
BUN: 25 mg/dL — ABNORMAL HIGH (ref 6–20)
CO2: 22 mmol/L (ref 22–32)
Calcium: 7.7 mg/dL — ABNORMAL LOW (ref 8.9–10.3)
Chloride: 100 mmol/L (ref 98–111)
Creatinine, Ser: 8.01 mg/dL — ABNORMAL HIGH (ref 0.44–1.00)
GFR calc Af Amer: 6 mL/min — ABNORMAL LOW (ref >60)
GFR calc non Af Amer: 5 mL/min — ABNORMAL LOW (ref >60)
Glucose, Bld: 174 mg/dL — ABNORMAL HIGH (ref 70–99)
Potassium: 3.7 mmol/L (ref 3.5–5.1)
Sodium: 135 mmol/L (ref 135–145)

## 2018-07-20 LAB — GLUCOSE, CAPILLARY
Glucose-Capillary: 100 mg/dL — ABNORMAL HIGH (ref 70–99)
Glucose-Capillary: 141 mg/dL — ABNORMAL HIGH (ref 70–99)
Glucose-Capillary: 151 mg/dL — ABNORMAL HIGH (ref 70–99)
Glucose-Capillary: 173 mg/dL — ABNORMAL HIGH (ref 70–99)
Glucose-Capillary: 181 mg/dL — ABNORMAL HIGH (ref 70–99)
Glucose-Capillary: 222 mg/dL — ABNORMAL HIGH (ref 70–99)
Glucose-Capillary: 86 mg/dL (ref 70–99)

## 2018-07-20 NOTE — Progress Notes (Signed)
[facility] Progress Note  Priscilla [name] 48 y.o. [birth year]   Subjective: Feels good. Denies abd pain. Denies N/V for the past 2 days. Had nonbloody diarrhea after eating dinner yesterday that she attributes to not having enough Creon. Tolerated lunch and dinner otherwise.  Objective: Vital signs: Vitals:   07/20/18 0459 07/20/18 0900  BP: 100/61 (!) 100/57  Pulse: 94 91  Resp:  20  Temp: 99.1 F (37.3 C) 98.3 F (36.8 C)  SpO2: 100% 94%    Physical Exam: Gen: alert, no acute distress  HEENT: anicteric sclera CV: RRR Chest: CTA B Abd: soft, nontender, nondistended, +BS Ext: no edema  Lab Results: Recent Labs    07/18/18 0014 07/19/18 0646 07/20/18 0618  NA 135 136 135  K 5.5* 4.0 3.7  CL 99 101 100  CO2 20* 23 22  GLUCOSE 217* 145* 174*  BUN 28* 24* 25*  CREATININE 8.58* 7.84* 8.01*  CALCIUM 8.1* 7.6* 7.7*  MG 1.9 1.8  --   PHOS 2.8  --   --    Recent Labs    07/18/18 0014  ALBUMIN 1.9*   Recent Labs    07/17/18 2339 07/18/18 0321  07/19/18 1917 07/20/18 0618  WBC 24.8* 23.3*   < > 30.2* 23.9*  NEUTROABS 21.1* 19.0*  --   --   --   HGB 7.5* 7.0*   < > 7.5* 7.1*  HCT 22.4* 21.8*   < > 23.3* 22.3*  MCV 87.8 87.2   < > 88.3 89.2  PLT 269 259   < > 308 335   < > = values in this interval not displayed.      Assessment/Plan: Gastric ulcer bleed - s/p repeat EGD yesterday with area of bleeding near previously clipped area in stomach. S/P epi injection and cautery. Hgb 7.1. Follow H/Hs. Supportive care. Hold ASA/Plavix. Continue Protonix 40 mg BID.   Priscilla [name] 07/20/2018, 10:28 AM  Questions please call [phone] ID: Priscilla [name], female   DOB: 21-Jun-1970, 48 y.o.   MRN: [id number]

## 2018-07-20 NOTE — Progress Notes (Signed)
   Subjective: Patient feels well this morning, no further episodes of bleeding. Has mild cough this morning but denies chest pain, SOB, or myalgias. Underwent EGD yesterday. Afebrile.   Objective:  Vital signs in last 24 hours: Vitals:   07/19/18 1926 07/19/18 2037 07/20/18 0459 07/20/18 0900  BP: 101/65 109/63 100/61 (!) 100/57  Pulse: 100 (!) 101 94 91  Resp:    20  Temp: 98.3 F (36.8 C) 97.9 F (36.6 C) 99.1 F (37.3 C) 98.3 F (36.8 C)  TempSrc: Oral Oral Oral Oral  SpO2: 98% 100% 100% 94%  Weight: 57.7 kg   58.2 kg  Height:       Physical Exam Constitutional: NAD, appears comfortable Cardiovascular: RRR, no murmurs, rubs, or gallops.  Pulmonary/Chest: CTAB, no wheezes, rales, or rhonchi. Extremities: Warm and well perfused. No edema. Right Groin with packing and overlying bandage in place, no drainage.  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 Stabilized with 2 units of pRBC and 1unit of platelets. Weaned from pressors and transferred out of the ICU on 3/20. Unfortunately developed recurrent hematemesis overnight on 3/20. Underwent repeat EGD yesterday with erythematous mucosa with bleeding in the gastric body, injected with epi and cauterized. She has not had any further episodes of hematemesis or melena. Feels well. No pain.  -- Renal diet; tolerating well  -- Hold ASA & plavix for today; repeat CBC tomorrow.  -- Continue Protonix 40 mg BID -- GI following; appreciate recommendations   Right Groin AVF Infection: S/p I&D per [facility]  on 3/22. Gram stain with abundant gram negative rods, rare gram positive cocci. Cultures pending.  -- Continue ceftriaxone (day 2) -- [facility] consulted; appreciate assistance    Pancreatic Insuffiencey: Hx Pancreas transplant January of 2012. Diarrhea has resolved -- Continue creon   ESRD on Peritoneal Dialysis: S/p failed renal transplant  -- Nephrology consult, appreciate assistance   PVD: S/p Bilateral common iliac artery angioplasty and stenting  -- Holding ASA & Plavix with acute GI bleed  Ischemic Cardiomyopathy Hx Severe MR s/p mitral valve repair Hx PFO s/p closure HTN S/p CABG x 2. (LIMA to LAD, SVG to PDA in 2016 by Dr. Roxan Hockey). During his procedure in 2016, she also had mitral valve repair with for severe MR and PFO closure. -- Antihypertensives on hold with soft BPs  (Coreg, losartan, clonidine)  Type I DM: Prescribed NPH 20 units QHS, Humalog 1-10 units TID AC at home.  -- Continue NPH 10 units @ 6pm and custom SSI as recommended -- CBG checks   FEN: fluid restrict, replete lytes prn, renal diet VTE ppx: SCDs Code Status: FULL    Dispo: Anticipated discharge 1-2 days.   Velna Ochs, MD 07/20/2018, 10:09 AM Pager: [phone]

## 2018-07-20 NOTE — Progress Notes (Addendum)
[facility] Progress Note   Dialysis Orders: PD [facility] EDW 54 kg 6 total exchanges- 5 on the cycler 2 L fill vol 1 hr 30 min dwell time and 1 pause of 1500 mL, 4 hrs, dry day.  Assessment/Plan: 1. Upper GIB - EGD x2due to clotted blood in stomach. On 2nd egd 3/16had Rx of gastric ulcers w/ stigmata of bleeding x2.New hematemesis 3/20. Seen by GI, asa/ plavix held and resumed IV PPI - repeat EGD 3/22 - had epi and bicap of oozing gastric ulcer. 2.  Anemia abl/ ckd - sp 2up prbc's on 3/15. Hblow 7 down to 7.1  - trend daily - transfuse if < 7 or symptomatic . Aranesp 100  q Friday. Low tsat 12% on  IV Fe load. - if hgb doesn't trend up will ^ ESA dose on Friday - 3. Hypotension - due to bleed/ anemia. BP's marginal BP meds on hold.   4. Volume  - net UF 1.3 Sunday  And 1122  this am  BP still on soft side but improving. Will continue to use  2.5%  5. ESRD on CCPD - cont PD nightly 6. CAD hx CABG/ MV repair 7. HTN -holding BP meds 8. DM2 on insulin 9. Hypomagnesemia - very low1.1, got 6gm IV Mg 3/19-20, level better 1.8 3/22 10. Hyperkalemia - K 3.7  Resume 20 mEq / day po.  11. Right groin wound/PVD - infection - post AVF repair 3/10 on ceftriaxone- plavix/asa on hold- culture GNR and rare gram cocci 12. Hx pancreas transplant 2012 - on creon for insufficiency 13. Hx mitral valve repar/PFO closure - CABG x 2 -   [name], PA-C [facility] Beeper [phone] 07/20/2018,8:36 AM  LOS: 8 days   Subjective:   Notes some SOB when supine.  Sats 100% on room air this am.  Hasn't been up moving much.. Starting to eat more. Takes pancrease for pancreatic insufficiency.  Objective Vitals:   07/19/18 1646 07/19/18 1926 07/19/18 2037 07/20/18 0459  BP: (!) 88/61 101/65 109/63 100/61  Pulse: 99 100 (!) 101 94  Resp:      Temp:  98.3 F (36.8 C) 97.9 F (36.6 C) 99.1 F (37.3 C)  TempSrc:  Oral Oral Oral  SpO2:  98% 100% 100%  Weight:  57.7 kg     Height:       Physical Exam General: NAD on room air sitting up in bed Heart: RRR with rare ectopy on tele Extremities: no distal edema Dialysis Access:  PD cath   Additional Objective Labs: Basic Metabolic Panel: Recent Labs  Lab 07/16/18 0858  07/17/18 0438 07/18/18 0014 07/19/18 0646 07/20/18 0618  NA 137  --  136 135 136 135  K 2.9*  --  3.4* 5.5* 4.0 3.7  CL 104  --  103 99 101 100  CO2 24  --  24 20* 23 22  GLUCOSE 70   < > 294* 217* 145* 174*  BUN 30*  --  27* 28* 24* 25*  CREATININE 8.05*  --  8.01* 8.58* 7.84* 8.01*  CALCIUM 7.4*  --  7.7* 8.1* 7.6* 7.7*  PHOS 3.3  --  3.1 2.8  --   --    < > = values in this interval not displayed.   Liver Function Tests: Recent Labs  Lab 07/18/18 0014  ALBUMIN 1.9*   No results for input(s): LIPASE, AMYLASE in the last 168 hours. CBC: Recent Labs  Lab 07/16/18 [phone]  07/17/18 2339 07/18/18 0321 07/18/18 0750 07/18/18 1858 07/19/18 0646 07/19/18 1917 07/20/18 0618  WBC 14.9*   < > 24.8* 23.3* 23.7* 27.6* 26.3* 30.2* 23.9*  NEUTROABS 11.5*  --  21.1* 19.0*  --   --   --   --   --   HGB 7.8*   < > 7.5* 7.0* 7.2* 7.9* 7.2* 7.5* 7.1*  HCT 24.0*   < > 22.4* 21.8* 22.0* 24.5* 21.5* 23.3* 22.3*  MCV 87.6   < > 87.8 87.2 87.3 88.1 88.5 88.3 89.2  PLT 242   < > 269 259 275 297 285 308 335   < > = values in this interval not displayed.   Blood Culture    Component Value Date/Time   SDES ABSCESS 07/19/2018 1127   SPECREQUEST Normal 07/19/2018 1127   CULT PENDING 07/19/2018 1127   REPTSTATUS PENDING 07/19/2018 1127    Cardiac Enzymes: No results for input(s): CKTOTAL, CKMB, CKMBINDEX, TROPONINI in the last 168 hours. CBG: Recent Labs  Lab 07/19/18 1634 07/19/18 2026 07/20/18 0000 07/20/18 0454 07/20/18 0823  GLUCAP 103* 128* 222* 181* 151*   Iron Studies:  Recent Labs    07/17/18 1410  IRON 20*  TIBC 161*   Lab Results  Component Value Date   INR 1.3 (H) 07/12/2018   INR 1.2 07/07/2018   INR 1.1  06/30/2018   Studies/Results: No results found. Medications: . sodium chloride 10 mL/hr at 07/18/18 1542  . cefTRIAXone (ROCEPHIN)  IV 2 g (07/19/18 1219)  . dialysis solution 2.5% low-MG/low-CA    . erythromycin 250 mL/hr at 07/18/18 1222  . ferric gluconate (FERRLECIT/NULECIT) IV     . amitriptyline  10 mg Oral QHS  . atorvastatin  10 mg Oral QHS  . [START ON 07/24/2018] darbepoetin (ARANESP) injection - NON-DIALYSIS  100 mcg Subcutaneous Q Fri-1800  . feeding supplement (NEPRO CARB STEADY)  237 mL Oral TID BM  . gabapentin  100 mg Oral TID  . gentamicin cream  1 application Topical Daily  . insulin aspart  1-5 Units Subcutaneous Q4H  . insulin NPH Human  10 Units Subcutaneous QPM  . latanoprost  1 drop Both Eyes QHS  . lipase/protease/amylase  108,000 Units Oral TID WC  . lipase/protease/amylase  72,000 Units Oral With snacks  . multivitamin  1 tablet Oral QHS  . pantoprazole  40 mg Oral BID AC  . potassium chloride  20 mEq Oral Daily      I have seen and examined this patient and agree with plan and assessment in the above note with renal recommendations/intervention highlighted.  Feels well.  Hgb down slightly to 7.1.  Continue to follow H/H and transfuse if <7.  Continue with CCPD. Broadus John A Daiton Cowles,MD 07/20/2018 1:36 PM

## 2018-07-20 NOTE — Progress Notes (Signed)
[facility]  Subjective  - Doing well over all. States the right groin drainage odor is not as bad.   Objective 100/61 94 99.1 F (37.3 C) (Oral) (!) 23 100%  Intake/Output Summary (Last 24 hours) at 07/20/2018 0752 Last data filed at 07/19/2018 2030 Gross per 24 hour  Intake 12451 ml  Output -  Net 12451 ml    Right groin with incisional opening and greenish/yellow drainage, wick with tip of 4 x 4 and covered with dry 4 x 4.  Cultures FEW WBC PRESENT,BOTH PMN AND MONONUCLEAR  ABUNDANT GRAM NEGATIVE RODS  RARE GRAM POSITIVE COCCI   Assessment/Planning: Repair of right groin AV fistula (repair of right superficial femoral artery and repair of right femoral vein) 1 cm opening in groin incision for drainage. Dressing change 2 times daily with packing of open area dry guaze wick.  Currently on ceftriaxone   Roxy Horseman 07/20/2018 7:52 AM --  Laboratory Lab Results: Recent Labs    07/19/18 1917 07/20/18 0618  WBC 30.2* 23.9*  HGB 7.5* 7.1*  HCT 23.3* 22.3*  PLT 308 335   BMET Recent Labs    07/19/18 0646 07/20/18 0618  NA 136 135  K 4.0 3.7  CL 101 100  CO2 23 22  GLUCOSE 145* 174*  BUN 24* 25*  CREATININE 7.84* 8.01*  CALCIUM 7.6* 7.7*    COAG Lab Results  Component Value Date   INR 1.3 (H) 07/12/2018   INR 1.2 07/07/2018   INR 1.1 06/30/2018   No results found for: PTT

## 2018-07-20 NOTE — Progress Notes (Signed)
Subjective: Ms. Capano reports that she is doing well today, no acute events overnight. She denied any new episodes of hematemesis. She has been having some pain in her right groin area, states that vascular did some bedside debridement yesterday which was quite painful, she reported that she was given tramadol which didn't help, and then she was given Hydrocordone/acetaminophen which helped. We discussed that we are still holding her ASA and plavix, and that we will need to evaluate the risks and benefits of restarting them.   Objective:  Vital signs in last 24 hours: Vitals:   07/21/18 1424 07/21/18 1903 07/22/18 0436 07/22/18 0438  BP:  101/70 106/74   Pulse: 88 92 88   Resp: 15 16    Temp: 98 F (36.7 C) 98.4 F (36.9 C) 98 F (36.7 C)   TempSrc: Oral Oral Oral   SpO2: 100% 100% 100%   Weight:  58.2 kg  60.3 kg  Height:        General: Well appearing, NAD, resting comfortably Cardiac: RRR, no m/r/g Pulmonary: CTABL, no wheezing or rhonchi Abdomen: Soft, non-tender, non-tendeded Extremity:Right groin with overlying bandage in place, no obvious drainage Psychiatry: Normal mood and affect    Assessment/Plan:  Principal Problem:   GI bleed Active Problems:   ESRD (end stage renal disease) (HCC)   Diabetes mellitus type 1 (HCC)   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 She received epineprhine injection, cautery, and endo clip placement, she had received 2 units pRBC and 1 unit of platelets. Transferred out of ICU on 3/20. She had been restarted on her ASA and plavix but then had a recurrence of hematemesis the night on 3/20. GI took her for endoscopy that  showed the non-bleeding gastric ulcer with previously placed 2 endoclips with no active bleeding, with localized severely erythematous mucosa with bleeding in the gastric body, injected with epinephrine for hemostasis and coagulation with bipolar probe, and a few non-bleeding gastric ulcers with clean base. Yesterday her Hgb dropped down to 6.7, she received 1 unit of pRBC and Hgb improved to 9.1, hgb today is 8.8. She denies any episodes of hematemesis.  -GI following, appreciate recommendations -Continue to hold ASA and plavix -CBC daily -Continue protonix BID -S/p erythromycin IV x1  Right groin AVF infection: S/p bilateral iliac stents on 09/19/2017, found to have a right groin AVF and underwent repair on 07/07/18. She has some purulent, foul smelling drainage out of her wound, no erythema or edema around the area. Vascular was consulted, they probed and opened wound to facilitate drainage, cultures sent, and they recommended continuing antibiotics. Cultures grew klebsiella, vascular considering OR debridement today. -Continue cefazolin (day 4) -Wet to dry dressings -Vascular following, appreciate recommendations, plan for debridement today  Pancreatic insufficiency: -Patient has a history of pancreas transplant in Jan 2012, she is on Creon at home for this. She denies any diarrhea or issues.  -Continue Creon  ESRD on PD: -Nephrology following, appreciate recommendations -Continue PD overnight  PVD: Patient has had bilateral common iliac artery angioplasty and stenting, she is on ASA and plavix for this.  -Holding ASA and plavix due to GI bleed -Will need to discuss with  vascular the benefits of restarting this -Consider restarting medications tomorrow  Ischemic cardiomyopathy Hx severe MR s/p mitral valve repair Hx PFO s/p closure HTN:  S/p CABG x2. She is on coreg, lorartan, and clonidine at home. These were held due to her UGIB causing hypotension requiring pressors. Continue  to monitor and restart as needed.   DM type 1: -She is on NPH 20 units QHS and humalog 1-10 units TID AC at home.  -Continue NPH 10 units and custom SSI -Frequent CBCs  FEN: No fluids, replete lytes prn, NPO VTE ppx: SCDs Code Status: FULL    Dispo: Anticipated discharge in approximately 1-2 day(s).   Priscilla Noble, MD 07/22/2018, 7:13 AM Pager: [phone]

## 2018-07-21 ENCOUNTER — Encounter (HOSPITAL_COMMUNITY): Payer: Self-pay | Admitting: Gastroenterology

## 2018-07-21 LAB — CBC
HCT: 20.6 % — ABNORMAL LOW (ref 36.0–46.0)
HCT: 27.7 % — ABNORMAL LOW (ref 36.0–46.0)
Hemoglobin: 6.7 g/dL — CL (ref 12.0–15.0)
Hemoglobin: 9.1 g/dL — ABNORMAL LOW (ref 12.0–15.0)
MCH: 29 pg (ref 26.0–34.0)
MCH: 28.8 pg (ref 26.0–34.0)
MCHC: 32.5 g/dL (ref 30.0–36.0)
MCHC: 32.9 g/dL (ref 30.0–36.0)
MCV: 89.2 fL (ref 80.0–100.0)
MCV: 87.7 fL (ref 80.0–100.0)
Platelets: 340 10*3/uL (ref 150–400)
Platelets: 336 10*3/uL (ref 150–400)
RBC: 2.31 MIL/uL — ABNORMAL LOW (ref 3.87–5.11)
RBC: 3.16 MIL/uL — ABNORMAL LOW (ref 3.87–5.11)
RDW: 16 % — ABNORMAL HIGH (ref 11.5–15.5)
RDW: 15.2 % (ref 11.5–15.5)
WBC: 23 10*3/uL — ABNORMAL HIGH (ref 4.0–10.5)
WBC: 27.1 10*3/uL — ABNORMAL HIGH (ref 4.0–10.5)
nRBC: 0.4 % — ABNORMAL HIGH (ref 0.0–0.2)
nRBC: 0.3 % — ABNORMAL HIGH (ref 0.0–0.2)

## 2018-07-21 LAB — AEROBIC CULTURE W GRAM STAIN (SUPERFICIAL SPECIMEN): Special Requests: NORMAL

## 2018-07-21 LAB — GLUCOSE, CAPILLARY
GLUCOSE-CAPILLARY: 222 mg/dL — AB (ref 70–99)
Glucose-Capillary: 210 mg/dL — ABNORMAL HIGH (ref 70–99)
Glucose-Capillary: 189 mg/dL — ABNORMAL HIGH (ref 70–99)
Glucose-Capillary: 87 mg/dL (ref 70–99)
Glucose-Capillary: 100 mg/dL — ABNORMAL HIGH (ref 70–99)

## 2018-07-21 LAB — AEROBIC CULTURE  (SUPERFICIAL SPECIMEN)

## 2018-07-21 LAB — PREPARE RBC (CROSSMATCH)

## 2018-07-21 MED ORDER — GENTAMICIN SULFATE 0.1 % EX CREA: 1.0000 "application " | TOPICAL_CREAM | Freq: Every day | CUTANEOUS | Status: DC

## 2018-07-21 MED ORDER — SODIUM CHLORIDE 0.9% IV SOLUTION
Freq: Once | INTRAVENOUS | Status: AC
  Administered 2018-07-21: 21:00:00 via INTRAVENOUS

## 2018-07-21 MED ORDER — MORPHINE SULFATE (PF) 2 MG/ML IV SOLN
1.0000 mg | Freq: Once | INTRAVENOUS | Status: AC
  Administered 2018-07-21: 1 mg via INTRAVENOUS
  Filled 2018-07-21: qty 1

## 2018-07-21 MED ORDER — TRAMADOL HCL 50 MG PO TABS
50.0000 mg | ORAL_TABLET | Freq: Four times a day (QID) | ORAL | Status: DC | PRN
  Administered 2018-07-22 – 2018-07-23 (×2): 50 mg via ORAL
  Filled 2018-07-21 (×2): qty 1

## 2018-07-21 MED ORDER — TRAMADOL HCL 50 MG PO TABS
50.0000 mg | ORAL_TABLET | Freq: Two times a day (BID) | ORAL | Status: DC | PRN
  Administered 2018-07-21: 50 mg via ORAL
  Filled 2018-07-21: qty 1

## 2018-07-21 MED ORDER — HEPARIN 1000 UNIT/ML FOR PERITONEAL DIALYSIS: 500.0000 [IU] | INTRAMUSCULAR | Status: DC | PRN

## 2018-07-21 MED ORDER — DELFLEX-LC/2.5% DEXTROSE 394 MOSM/L IP SOLN: INTRAPERITONEAL | Status: DC

## 2018-07-21 MED ORDER — ONDANSETRON 4 MG PO TBDP
8.0000 mg | ORAL_TABLET | Freq: Three times a day (TID) | ORAL | Status: DC | PRN
  Administered 2018-07-21: 8 mg via ORAL
  Filled 2018-07-21 (×2): qty 2

## 2018-07-21 MED ORDER — DEXTROSE 5 % IV SOLN
500.0000 mg | Freq: Two times a day (BID) | INTRAVENOUS | Status: DC
  Administered 2018-07-21 – 2018-07-23 (×5): 500 mg via INTRAVENOUS
  Filled 2018-07-21 (×6): qty 5

## 2018-07-21 MED ORDER — HYDROCODONE-ACETAMINOPHEN 5-325 MG PO TABS
2.0000 | ORAL_TABLET | Freq: Once | ORAL | Status: AC
  Administered 2018-07-21: 2 via ORAL
  Filled 2018-07-21: qty 2

## 2018-07-21 NOTE — Evaluation (Signed)
Physical Therapy Evaluation Patient Details Name: Priscilla [name] MRN: [id number] DOB: [birth year] Today's Date: 07/21/2018   History of Present Illness  Pt presents for EGD for GI bleed with PMH: end-stage renal disease on peritoneal dialysis, status post CABG, kidney and pancreatic transplant in 2012, mitral valve repair, ischemic cardiomyopathy 2/16, diabetes,recentrepair of right common femoral artery 2 femoral vein fistula with pain patch angioplasty on 07/07/2018.  Clinical Impression  Pt admitted with above diagnosis. Pt currently with functional limitations due to the deficits listed below (see PT Problem List). Eval limited today due to pt's hgb being low, receiving blood this afternoon and can hopefully do more tomorrow. Min A needed for OOB mobility due to weakness. Pt with L foot drop for which she has been seeing outpt PT and planning to get L AFO. Recommend return to this at d/c (unless outpt PT closed for covid, then recommend short term HHPT). Pt will benefit from skilled PT to increase their independence and safety with mobility to allow discharge to the venue listed below.       Follow Up Recommendations Outpatient PT    Equipment Recommendations  Other (comment)(TBD)    Recommendations for Other Services       Precautions / Restrictions Precautions Precautions: Fall Precaution Comments: has L partial foot drop, was in the process of working with outpt PT and getting AFO Restrictions Weight Bearing Restrictions: No      Mobility  Bed Mobility Overal bed mobility: Needs Assistance Bed Mobility: Supine to Sit     Supine to sit: Supervision     General bed mobility comments: increased time and effort to get to EOB but no physical assist needed  Transfers Overall transfer level: Needs assistance Equipment used: 1 person hand held assist Transfers: Sit to/from Stand Sit to Stand: Min assist         General transfer comment: min A to  steady  Ambulation/Gait             General Gait Details: deferred today due to low hgb, pt to get blood later today  Stairs            Wheelchair Mobility    Modified Rankin (Stroke Patients Only)       Balance Overall balance assessment: Needs assistance Sitting-balance support: No upper extremity supported Sitting balance-Leahy Scale: Fair     Standing balance support: Bilateral upper extremity supported Standing balance-Leahy Scale: Poor Standing balance comment: reliant on UE support at this point                             Pertinent Vitals/Pain Pain Assessment: Faces Faces Pain Scale: Hurts a little bit Pain Location: infected wound R thigh Pain Descriptors / Indicators: Sore Pain Intervention(s): Limited activity within patient's tolerance;Monitored during session    Home Living Family/patient expects to be discharged to:: Private residence Living Arrangements: Children;Other relatives Available Help at Discharge: Family;Available PRN/intermittently Type of Home: House Home Access: Stairs to enter Entrance Stairs-Rails: None Entrance Stairs-Number of Steps: 1 Home Layout: Two level;Able to live on main level with bedroom/bathroom Home Equipment: Kasandra Knudsen - single point Additional Comments: pt lives with her son (54 yo) and sisters. Does not have to go to upstairs bedroom    Prior Function Level of Independence: Independent with assistive device(s)         Comments: used cane occasionally after a fall due to foot drop, works as a Education officer, museum  Hand Dominance   Dominant Hand: Right    Extremity/Trunk Assessment   Upper Extremity Assessment Upper Extremity Assessment: Generalized weakness    Lower Extremity Assessment Lower Extremity Assessment: Generalized weakness;LLE deficits/detail LLE Deficits / Details: ankle df 3/5 LLE Sensation: decreased light touch;decreased proprioception;history of peripheral neuropathy LLE  Coordination: decreased fine motor;decreased gross motor    Cervical / Trunk Assessment Cervical / Trunk Assessment: Normal  Communication   Communication: No difficulties  Cognition Arousal/Alertness: Awake/alert Behavior During Therapy: WFL for tasks assessed/performed Overall Cognitive Status: Within Functional Limits for tasks assessed                                        General Comments General comments (skin integrity, edema, etc.): took pt theraband for LE ther ex    Exercises General Exercises - Lower Extremity Ankle Circles/Pumps: AROM;Both;20 reps;Seated;Supine Long Arc Quad: AROM;Both;10 reps;Seated Heel Slides: AROM;Both;10 reps;Supine Hip ABduction/ADduction: AROM;Both;10 reps;Seated Straight Leg Raises: AROM;Both;10 reps;Supine Hip Flexion/Marching: AROM;Both;15 reps;Standing Heel Raises: AROM;Both;15 reps;Standing Other Exercises Other Exercises: bridging, bilateral and unilateral, 10x, supine   Assessment/Plan    PT Assessment Patient needs continued PT services  PT Problem List Decreased strength;Decreased activity tolerance;Decreased balance;Decreased mobility;Decreased coordination;Decreased knowledge of use of DME;Decreased knowledge of precautions;Impaired sensation       PT Treatment Interventions DME instruction;Gait training;Stair training;Functional mobility training;Therapeutic activities;Therapeutic exercise;Balance training;Neuromuscular re-education;Cognitive remediation    PT Goals (Current goals can be found in the Care Plan section)  Acute Rehab PT Goals Patient Stated Goal: return home PT Goal Formulation: With patient Time For Goal Achievement: 08/04/18 Potential to Achieve Goals: Good    Frequency Min 3X/week   Barriers to discharge        Co-evaluation               AM-PAC PT "6 Clicks" Mobility  Outcome Measure Help needed turning from your back to your side while in a flat bed without using bedrails?:  None Help needed moving from lying on your back to sitting on the side of a flat bed without using bedrails?: A Little Help needed moving to and from a bed to a chair (including a wheelchair)?: A Little Help needed standing up from a chair using your arms (e.g., wheelchair or bedside chair)?: A Lot Help needed to walk in hospital room?: A Lot Help needed climbing 3-5 steps with a railing? : Total 6 Click Score: 15    End of Session   Activity Tolerance: Patient tolerated treatment well Patient left: in chair;with call bell/phone within reach Nurse Communication: Mobility status PT Visit Diagnosis: Unsteadiness on feet (R26.81);Muscle weakness (generalized) (M62.81)    Time: 0927-1000 PT Time Calculation (min) (ACUTE ONLY): 33 min   Charges:   PT Evaluation $PT Eval Moderate Complexity: 1 Mod PT Treatments $Neuromuscular Re-education: 8-22 mins        [place]  Pager [phone] Office [place] 07/21/2018, 1:49 PM

## 2018-07-21 NOTE — Progress Notes (Signed)
[facility] Progress Note  Priscilla [name] 48 y.o. [birth year]   Subjective: Feels good. Sitting in bedside chair. Denies rectal bleeding. Denies N/V/abd pain.  Objective: Vital signs: Vitals:   07/21/18 1129 07/21/18 1130  BP: 105/62 105/62  Pulse:  93  Resp:  18  Temp: 98.1 F (36.7 C) (!) 97.5 F (36.4 C)  SpO2: 100% 97%    Physical Exam: Gen: alert, no acute distress  HEENT: anicteric sclera CV: RRR Chest: CTA B Abd: soft, nontender, nondistended, +BS Ext: no edema  Lab Results: Recent Labs    07/19/18 0646 07/20/18 0618  NA 136 135  K 4.0 3.7  CL 101 100  CO2 23 22  GLUCOSE 145* 174*  BUN 24* 25*  CREATININE 7.84* 8.01*  CALCIUM 7.6* 7.7*  MG 1.8  --    No results for input(s): AST, ALT, ALKPHOS, BILITOT, PROT, ALBUMIN in the last 72 hours. Recent Labs    07/20/18 0618 07/21/18 0334  WBC 23.9* 23.0*  HGB 7.1* 6.7*  HCT 22.3* 20.6*  MCV 89.2 89.2  PLT 335 340      Assessment/Plan: Gastric ulcer bleed - Hgb drop to 6.7 without any overt signs of bleeding. Receiving one unit PRBCs. Bedside debridement by vascular surgery today and may do operative debridement tomorrow if needed. Continue to hold ASA/Plavix until signs that Hgb is stable without need for blood transfusions. Continue PPI PO BID. On renal diet. Will follow.   [name] 07/21/2018, 1:32 PM  Questions please call [phone] ID: [name], female   DOB: [birth year], 48 y.o.   MRN: [id number]

## 2018-07-21 NOTE — Progress Notes (Signed)
CBG-89- Pt given snacks to eat.

## 2018-07-21 NOTE — Progress Notes (Signed)
Pharmacy Antibiotic Note  Priscilla [name] is a 48 y.o. female admitted on 07/12/2018 with Right groin wound infection.  Right groin AVF site - cultures with klebsiella.   Pharmacy has been consulted for Cefazolin dosing, to narrow from Ceftriaxone.  Vascular surgery considering OR debridement tomorrow, NPO @ [facility].   ERSD on CCPD.   Receiving IV antibiotics via IV.   Plan: Cefazolin 500 mg IV q12 hours Monitor clinical status and culture results daily.     Height: 5' (152.4 cm) Weight: 131 lb 3.2 oz (59.5 kg) IBW/kg (Calculated) : 45.5  Temp (24hrs), Avg:98.6 F (37 C), Min:98.2 F (36.8 C), Max:98.9 F (37.2 C)  Recent Labs  Lab 07/16/18 0858 07/17/18 0438  07/18/18 0014  07/18/18 1858 07/19/18 0646 07/19/18 1917 07/20/18 0618 07/21/18 0334  WBC 14.9*  --    < >  --    < > 27.6* 26.3* 30.2* 23.9* 23.0*  CREATININE 8.05* 8.01*  --  8.58*  --   --  7.84*  --  8.01*  --    < > = values in this interval not displayed.    Estimated Creatinine Clearance: 7.1 mL/min (A) (by C-G formula based on SCr of 8.01 mg/dL (H)).    Allergies  Allergen Reactions  . Pork-Derived Products Other (See Comments)    Pt is [place]    Antimicrobials this admission: /22 CTX > (3/28)  3/24 Cefazolin >>   (?end 3/28)  Microbiology results: 3/22 wound Cx: Kleb pneumo - sens to everything but ampicillin     Thank you for allowing pharmacy to be a part of this patient's care. Nicole Cella, RPh 07/21/2018 11:34 AM

## 2018-07-21 NOTE — Progress Notes (Signed)
   Subjective: Doing well this morning. Stayed up all night watching carpool karaoke. Had some anxiety but improved this morning.   Objective:  Vital signs in last 24 hours: Vitals:   07/20/18 1725 07/20/18 2057 07/21/18 0633 07/21/18 1104  BP: 98/69 98/67 (!) 108/59 (!) 104/58  Pulse: 91 100 85 (!) 41  Resp: 18   17  Temp: 98.9 F (37.2 C) 98.9 F (37.2 C) 98.4 F (36.9 C) 98.2 F (36.8 C)  TempSrc: Oral Oral Oral Oral  SpO2:  98% 99% 96%  Weight: 58.2 kg  59.5 kg   Height:       Physical Exam Constitutional: NAD, appears comfortable Cardiovascular:RRR, no murmurs, rubs, or gallops.  Pulmonary/Chest:CTAB, no wheezes, rales, or rhonchi. Extremities: Warm and well perfused. No edema. Right Groin with packing and overlying bandage in place, no drainage.  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 Stabilized with 2 units of pRBC and 1unit of platelets. Weaned from pressors and transferred out of the ICU on 3/20. Unfortunately developed recurrent hematemesis overnight on 3/20. Underwent repeat EGD 3/22 with erythematous mucosa with bleeding in the gastric body, injected with epi and cauterized. No futher episodes of bleeding. Hemoglobin slowly trending down, 6.7 today.  -- Transfuse 1 unit -- F/u post transfusion CBC -- Continue holding ASA & plavix -- Daily CBC --Continue Protonix 40 mg BID -- GI following; appreciate recommendations   Right Groin AVF Infection: S/p I&D on 3/22. Cultures growing  Klebsiella. Vascular surgery considering OR debridement tomorrow, NPO @ [facility].  -- Narrow ceftriaxone -> Cefazolin (day 3) -- VVS consulted; appreciate assistance   Pancreatic Insuffiencey: Hx Pancreas transplant January of 2012. Diarrhea has resolved -- Continue creon   ESRD on Peritoneal Dialysis:S/p failed renal transplant  -- Nephrology consult, appreciate assistance   PVD: S/p Bilateral common iliac artery angioplasty and stenting  -- Holding ASA & Plavix with acute GI bleed  Ischemic Cardiomyopathy Hx Severe MR s/p mitral valve repair Hx PFO s/p closure HTN S/p CABG x 2.(LIMA to LAD, SVG to PDA in 2016 by Dr. Roxan Hockey). During his procedure in 2016, she also had mitral valve repair withforsevere MR and PFO closure. -- Antihypertensives on hold with soft BPs (Coreg, losartan, clonidine)  Type I DM: Prescribed NPH 20 units QHS, Humalog 1-10 units TID AC at home.  -- Continue NPH 10 units @ 6pm and custom SSI as recommended -- CBG checks   GMW:NUUVO restrict, replete lytes prn, renal diet (NPO @ MN) VTE ppx: SCDs Code Status: FULL   Dispo: Anticipated discharge in approximately 2-3 day(s).   Priscilla Ochs, MD 07/21/2018, 11:11 AM Pager: [phone]

## 2018-07-21 NOTE — Progress Notes (Addendum)
[facility] Progress Note   Dialysis Orders: PD [facility] EDW 54 kg 6 total exchanges- 5 on the cycler 2 L fill vol 1 hr 30 min dwell time and 1 pause of 1500 mL, 4 hrs, dry day.  Assessment/Plan: 1. Upper GIB - EGD x2due to clotted blood in stomach. On 2nd egd 3/16had Rx of gastric ulcers w/ stigmata of bleeding x2.New hematemesis3/20. Seen by GI,asa/ plavixheld and resumed IV PPI - repeat EGD 3/22 - had epi and bicap of oozing gastric ulcer. 2.  Anemia abl/ ckd - sp 2up prbc's on 3/15. Hgb 6.7 today - transfusing 1 unit PRBC  . Aranesp 100  q Friday. Low tsat 12%on IV Fe load. - if hgb doesn't trend up will ^ ESA dose on Friday - hard to measure responsiveness during GIB 3. Hypotension - due to bleed/ anemia.BP's marginal BP meds on hold. 4. Volume  - net UF 1051   this am  BP still on soft side but improving. Will continue to use  2.5%  5. ESRD on CCPD - cont PD nightly 6. CAD hx CABG/ MV repair 7. HTN -holding BP meds 8. DM2 on insulin 9. Hypomagnesemia - very low1.1, got 6gm IV Mg 3/19-20, level better 1.8 3/22 10. Hyperkalemia -K 3.7 Resume 20 mEq / day po. No chemistries drawn today - will order for Wed 11. Right groin wound/PVD - infection - post AVF repair 3/10 on ceftriaxone- plavix/asa on hold- culture GNR and rare gram cocci. Persistent leukocytosis 12. Hx pancreas transplant 2012 - on creon for insufficiency 13. Hx mitral valve repar/PFO closure - CABG x 2 -    [name], PA-C [facility] [phone] 07/21/2018,9:24 AM  LOS: 9 days   Subjective:   C/o slight dry cough though she thinks it is better today than yesterday   Objective Vitals:   07/20/18 1717 07/20/18 1725 07/20/18 2057 07/21/18 0633  BP: 98/69 98/69 98/67  (!) 108/59  Pulse:  91 100 85  Resp:  18    Temp:  98.9 F (37.2 C) 98.9 F (37.2 C) 98.4 F (36.9 C)  TempSrc:  Oral Oral Oral  SpO2:   98% 99%  Weight:  58.2 kg  59.5 kg  Height:        Physical Exam General: NAD sats ok on room air spirits good Heart: tachy regular on tele Extremities: no LE edema Dialysis Access: PD cath  Additional Objective Labs: Basic Metabolic Panel: Recent Labs  Lab 07/16/18 0858  07/17/18 0438 07/18/18 0014 07/19/18 0646 07/20/18 0618  NA 137  --  136 135 136 135  K 2.9*  --  3.4* 5.5* 4.0 3.7  CL 104  --  103 99 101 100  CO2 24  --  24 20* 23 22  GLUCOSE 70   < > 294* 217* 145* 174*  BUN 30*  --  27* 28* 24* 25*  CREATININE 8.05*  --  8.01* 8.58* 7.84* 8.01*  CALCIUM 7.4*  --  7.7* 8.1* 7.6* 7.7*  PHOS 3.3  --  3.1 2.8  --   --    < > = values in this interval not displayed.   Liver Function Tests: Recent Labs  Lab 07/18/18 0014  ALBUMIN 1.9*   No results for input(s): LIPASE, AMYLASE in the last 168 hours. CBC: Recent Labs  Lab 07/16/18 0858  07/17/18 2339 07/18/18 0321  07/18/18 1858 07/19/18 0646 07/19/18 1917 07/20/18 0618 07/21/18 0334  WBC 14.9*   < >  24.8* 23.3*   < > 27.6* 26.3* 30.2* 23.9* 23.0*  NEUTROABS 11.5*  --  21.1* 19.0*  --   --   --   --   --   --   HGB 7.8*   < > 7.5* 7.0*   < > 7.9* 7.2* 7.5* 7.1* 6.7*  HCT 24.0*   < > 22.4* 21.8*   < > 24.5* 21.5* 23.3* 22.3* 20.6*  MCV 87.6   < > 87.8 87.2   < > 88.1 88.5 88.3 89.2 89.2  PLT 242   < > 269 259   < > 297 285 308 335 340   < > = values in this interval not displayed.   Blood Culture    Component Value Date/Time   SDES ABSCESS 07/19/2018 1127   SPECREQUEST Normal 07/19/2018 1127   CULT ABUNDANT GRAM NEGATIVE RODS 07/19/2018 1127   REPTSTATUS PENDING 07/19/2018 1127    Cardiac Enzymes: No results for input(s): CKTOTAL, CKMB, CKMBINDEX, TROPONINI in the last 168 hours. CBG: Recent Labs  Lab 07/20/18 1709 07/20/18 2101 07/21/18 0054 07/21/18 0630 07/21/18 0831  GLUCAP 86 173* 222* 210* 189*   Iron Studies: No results for input(s): IRON, TIBC, TRANSFERRIN, FERRITIN in the last 72 hours. Lab Results  Component Value Date   INR  1.3 (H) 07/12/2018   INR 1.2 07/07/2018   INR 1.1 06/30/2018   Studies/Results: No results found. Medications: . sodium chloride 10 mL/hr at 07/18/18 1542  . cefTRIAXone (ROCEPHIN)  IV Stopped (07/20/18 1208)  . dialysis solution 2.5% low-MG/low-CA    . erythromycin 250 mL/hr at 07/18/18 1222  . ferric gluconate (FERRLECIT/NULECIT) IV Stopped (07/20/18 1359)   . sodium chloride   Intravenous Once  . amitriptyline  10 mg Oral QHS  . atorvastatin  10 mg Oral QHS  . [START ON 07/24/2018] darbepoetin (ARANESP) injection - NON-DIALYSIS  100 mcg Subcutaneous Q Fri-1800  . feeding supplement (NEPRO CARB STEADY)  237 mL Oral TID BM  . gabapentin  100 mg Oral TID  . gentamicin cream  1 application Topical Daily  . insulin aspart  1-5 Units Subcutaneous Q4H  . insulin NPH Human  10 Units Subcutaneous QPM  . latanoprost  1 drop Both Eyes QHS  . lipase/protease/amylase  108,000 Units Oral TID WC  . lipase/protease/amylase  72,000 Units Oral With snacks  . multivitamin  1 tablet Oral QHS  . pantoprazole  40 mg Oral BID AC  . potassium chloride  20 mEq Oral Daily     I have seen and examined this patient and agree with plan and assessment in the above note with renal recommendations/intervention highlighted.  Hgb slowly trending down.  For blood transfusion of 1 unit today and continue to follow H/H.  Transfuse prn.  Governor Rooks Ernesto Zukowski,MD 07/21/2018 12:13 PM

## 2018-07-21 NOTE — Progress Notes (Addendum)
  Progress Note    07/21/2018 10:42 AM 2 Days Post-Op  Subjective:  Still concerned about R groin odor   Vitals:   07/20/18 2057 07/21/18 0633  BP: 98/67 (!) 108/59  Pulse: 100 85  Resp:    Temp: 98.9 F (37.2 C) 98.4 F (36.9 C)  SpO2: 98% 99%   Physical Exam: Lungs:  Non labored Incisions:  R groin incision with cloudy drainage with manipulation; odor Extremities:  Feet symmetrically warm to touch Abdomen:  Soft Neurologic: A&O  CBC    Component Value Date/Time   WBC 23.0 (H) 07/21/2018 0334   RBC 2.31 (L) 07/21/2018 0334   HGB 6.7 (LL) 07/21/2018 0334   HCT 20.6 (L) 07/21/2018 0334   PLT 340 07/21/2018 0334   MCV 89.2 07/21/2018 0334   MCH 29.0 07/21/2018 0334   MCHC 32.5 07/21/2018 0334   RDW 16.0 (H) 07/21/2018 0334   LYMPHSABS 1.5 07/18/2018 0321   MONOABS 1.8 (H) 07/18/2018 0321   EOSABS 0.7 (H) 07/18/2018 0321   BASOSABS 0.1 07/18/2018 0321    BMET    Component Value Date/Time   NA 135 07/20/2018 0618   K 3.7 07/20/2018 0618   CL 100 07/20/2018 0618   CO2 22 07/20/2018 0618   GLUCOSE 174 (H) 07/20/2018 0618   BUN 25 (H) 07/20/2018 0618   CREATININE 8.01 (H) 07/20/2018 0618   CREATININE 10.48 (H) 01/16/2015 0912   CALCIUM 7.7 (L) 07/20/2018 0618   CALCIUM 7.3 (L) 07/13/2008 0933   GFRNONAA 5 (L) 07/20/2018 0618   GFRAA 6 (L) 07/20/2018 0618    INR    Component Value Date/Time   INR 1.3 (H) 07/12/2018 1558     Intake/Output Summary (Last 24 hours) at 07/21/2018 1042 Last data filed at 07/20/2018 1600 Gross per 24 hour  Intake 670 ml  Output -  Net 670 ml     Assessment/Plan:  48 y.o. female is 2 weeks post op from repair of right groin AV fistula  R groin dry dressing changed; still having cloudy drainage and odor Continue rocephin PD per Nephrology 1u PRBCs per GI   Dagoberto Ligas, PA-C [facility] [phone] 07/21/2018 10:42 AM   Agree with the above.  I did as much debridement that she would  tolerate at the bedside.  There was a lot of necrotic fat that had a foul odor.   She will need wet to dry dressing changes BID.  I will make her NPO after midnight in case she needs operative debridement.   [employer]

## 2018-07-21 NOTE — Progress Notes (Signed)
CRITICAL VALUE ALERT  Critical Value:  Hemoglobin 6.7  Date & Time Notied:  07/21/2018 0515  Provider Notified: Alfonse Spruce   Orders Received/Actions taken:

## 2018-07-22 ENCOUNTER — Inpatient Hospital Stay (HOSPITAL_COMMUNITY): Payer: Medicare Other | Admitting: Anesthesiology

## 2018-07-22 ENCOUNTER — Encounter (HOSPITAL_COMMUNITY): Payer: Self-pay | Admitting: Certified Registered Nurse Anesthetist

## 2018-07-22 ENCOUNTER — Encounter (HOSPITAL_COMMUNITY)
Admission: EM | Disposition: A | Discharge status: Home Health | Payer: Self-pay | Source: Home / Self Care | Attending: Internal Medicine

## 2018-07-22 DIAGNOSIS — S71101A Unspecified open wound, right thigh, initial encounter: Secondary | ICD-10-CM

## 2018-07-22 DIAGNOSIS — I9789 Other postprocedural complications and disorders of the circulatory system, not elsewhere classified: Secondary | ICD-10-CM

## 2018-07-22 DIAGNOSIS — S71001A Unspecified open wound, right hip, initial encounter: Secondary | ICD-10-CM

## 2018-07-22 HISTORY — PX: GROIN DEBRIDEMENT: SHX5159

## 2018-07-22 LAB — CBC
HCT: 27.5 % — ABNORMAL LOW (ref 36.0–46.0)
Hemoglobin: 8.8 g/dL — ABNORMAL LOW (ref 12.0–15.0)
MCH: 28.2 pg (ref 26.0–34.0)
MCHC: 32 g/dL (ref 30.0–36.0)
MCV: 88.1 fL (ref 80.0–100.0)
NRBC: 0.4 % — AB (ref 0.0–0.2)
PLATELETS: 335 10*3/uL (ref 150–400)
RBC: 3.12 MIL/uL — ABNORMAL LOW (ref 3.87–5.11)
RDW: 15.8 % — AB (ref 11.5–15.5)
WBC: 20.6 10*3/uL — ABNORMAL HIGH (ref 4.0–10.5)

## 2018-07-22 LAB — BASIC METABOLIC PANEL
Anion gap: 12 (ref 5–15)
BUN: 23 mg/dL — ABNORMAL HIGH (ref 6–20)
CO2: 26 mmol/L (ref 22–32)
Calcium: 8 mg/dL — ABNORMAL LOW (ref 8.9–10.3)
Chloride: 97 mmol/L — ABNORMAL LOW (ref 98–111)
Creatinine, Ser: 7.58 mg/dL — ABNORMAL HIGH (ref 0.44–1.00)
GFR calc Af Amer: 7 mL/min — ABNORMAL LOW (ref >60)
GFR calc non Af Amer: 6 mL/min — ABNORMAL LOW (ref >60)
Glucose, Bld: 129 mg/dL — ABNORMAL HIGH (ref 70–99)
Potassium: 3.6 mmol/L (ref 3.5–5.1)
Sodium: 135 mmol/L (ref 135–145)

## 2018-07-22 LAB — GLUCOSE, CAPILLARY
Glucose-Capillary: 174 mg/dL — ABNORMAL HIGH (ref 70–99)
Glucose-Capillary: 177 mg/dL — ABNORMAL HIGH (ref 70–99)
Glucose-Capillary: 213 mg/dL — ABNORMAL HIGH (ref 70–99)
Glucose-Capillary: 81 mg/dL (ref 70–99)
Glucose-Capillary: 95 mg/dL (ref 70–99)
Glucose-Capillary: 81 mg/dL (ref 70–99)
Glucose-Capillary: 85 mg/dL (ref 70–99)

## 2018-07-22 LAB — RENAL FUNCTION PANEL
Albumin: 1.8 g/dL — ABNORMAL LOW (ref 3.5–5.0)
Anion gap: 12 (ref 5–15)
BUN: 25 mg/dL — ABNORMAL HIGH (ref 6–20)
CO2: 25 mmol/L (ref 22–32)
Calcium: 8.1 mg/dL — ABNORMAL LOW (ref 8.9–10.3)
Chloride: 96 mmol/L — ABNORMAL LOW (ref 98–111)
Creatinine, Ser: 7.84 mg/dL — ABNORMAL HIGH (ref 0.44–1.00)
GFR calc Af Amer: 6 mL/min — ABNORMAL LOW (ref >60)
GFR calc non Af Amer: 6 mL/min — ABNORMAL LOW (ref >60)
Glucose, Bld: 169 mg/dL — ABNORMAL HIGH (ref 70–99)
Phosphorus: 3.7 mg/dL (ref 2.5–4.6)
Potassium: 3.6 mmol/L (ref 3.5–5.1)
Sodium: 133 mmol/L — ABNORMAL LOW (ref 135–145)

## 2018-07-22 LAB — TYPE AND SCREEN
ABO/RH(D): O POS
Antibody Screen: NEGATIVE
Unit division: 0

## 2018-07-22 LAB — BPAM RBC
Blood Product Expiration Date: 202003242359
ISSUE DATE / TIME: 202003241102
Unit Type and Rh: 9500

## 2018-07-22 LAB — SURGICAL PCR SCREEN
MRSA, PCR: NEGATIVE
Staphylococcus aureus: NEGATIVE

## 2018-07-22 SURGERY — DEBRIDEMENT, INGUINAL REGION
Anesthesia: General | Site: Groin | Laterality: Right

## 2018-07-22 MED ORDER — ACETAMINOPHEN 10 MG/ML IV SOLN: 1000.0000 mg | Freq: Once | INTRAVENOUS | Status: DC | PRN

## 2018-07-22 MED ORDER — SODIUM CHLORIDE 0.9 % IV SOLN
INTRAVENOUS | Status: DC
  Administered 2018-07-22: 11:00:00 via INTRAVENOUS

## 2018-07-22 MED ORDER — OXYCODONE HCL 5 MG PO TABS: 5.0000 mg | ORAL_TABLET | Freq: Once | ORAL | Status: DC | PRN

## 2018-07-22 MED ORDER — ACETAMINOPHEN 160 MG/5ML PO SOLN: 1000.0000 mg | Freq: Once | ORAL | Status: DC | PRN

## 2018-07-22 MED ORDER — SUCCINYLCHOLINE CHLORIDE 20 MG/ML IJ SOLN
INTRAMUSCULAR | Status: DC | PRN
  Administered 2018-07-22: 60 mg via INTRAVENOUS

## 2018-07-22 MED ORDER — LIDOCAINE HCL (CARDIAC) PF 100 MG/5ML IV SOSY
PREFILLED_SYRINGE | INTRAVENOUS | Status: DC | PRN
  Administered 2018-07-22: 60 mg via INTRAVENOUS

## 2018-07-22 MED ORDER — SODIUM CHLORIDE 0.9 % IV SOLN
INTRAVENOUS | Status: DC | PRN
  Administered 2018-07-22: 20 ug/min via INTRAVENOUS

## 2018-07-22 MED ORDER — MIDAZOLAM HCL 2 MG/2ML IJ SOLN
INTRAMUSCULAR | Status: AC
  Filled 2018-07-22: qty 2

## 2018-07-22 MED ORDER — PROPOFOL 10 MG/ML IV BOLUS
INTRAVENOUS | Status: AC
  Filled 2018-07-22: qty 20

## 2018-07-22 MED ORDER — PHENYLEPHRINE HCL 10 MG/ML IJ SOLN
INTRAMUSCULAR | Status: DC | PRN
  Administered 2018-07-22: 80 ug via INTRAVENOUS

## 2018-07-22 MED ORDER — ONDANSETRON HCL 4 MG/2ML IJ SOLN
INTRAMUSCULAR | Status: DC | PRN
  Administered 2018-07-22: 4 mg via INTRAVENOUS

## 2018-07-22 MED ORDER — FENTANYL CITRATE (PF) 250 MCG/5ML IJ SOLN
INTRAMUSCULAR | Status: AC
  Filled 2018-07-22: qty 5

## 2018-07-22 MED ORDER — HYDROCODONE-ACETAMINOPHEN 5-325 MG PO TABS
1.0000 | ORAL_TABLET | Freq: Four times a day (QID) | ORAL | Status: DC | PRN
  Administered 2018-07-22 – 2018-07-23 (×3): 1 via ORAL
  Filled 2018-07-22 (×3): qty 1

## 2018-07-22 MED ORDER — FENTANYL CITRATE (PF) 100 MCG/2ML IJ SOLN
INTRAMUSCULAR | Status: DC | PRN
  Administered 2018-07-22: 100 ug via INTRAVENOUS
  Administered 2018-07-22: 50 ug via INTRAVENOUS

## 2018-07-22 MED ORDER — PROPOFOL 10 MG/ML IV BOLUS
INTRAVENOUS | Status: DC | PRN
  Administered 2018-07-22: 100 mg via INTRAVENOUS

## 2018-07-22 MED ORDER — FENTANYL CITRATE (PF) 100 MCG/2ML IJ SOLN
INTRAMUSCULAR | Status: AC
  Administered 2018-07-22: 50 ug via INTRAVENOUS
  Filled 2018-07-22: qty 2

## 2018-07-22 MED ORDER — FENTANYL CITRATE (PF) 100 MCG/2ML IJ SOLN
25.0000 ug | INTRAMUSCULAR | Status: DC | PRN
  Administered 2018-07-22 (×2): 50 ug via INTRAVENOUS

## 2018-07-22 MED ORDER — OXYCODONE HCL 5 MG/5ML PO SOLN: 5.0000 mg | Freq: Once | ORAL | Status: DC | PRN

## 2018-07-22 MED ORDER — SODIUM CHLORIDE 0.9 % IR SOLN
Status: DC | PRN
  Administered 2018-07-22: 1000 mL

## 2018-07-22 MED ORDER — ACETAMINOPHEN 500 MG PO TABS: 1000.0000 mg | ORAL_TABLET | Freq: Once | ORAL | Status: DC | PRN

## 2018-07-22 SURGICAL SUPPLY — 29 items
BANDAGE ACE 4X5 VEL STRL LF (GAUZE/BANDAGES/DRESSINGS) IMPLANT
BANDAGE ACE 6X5 VEL STRL LF (GAUZE/BANDAGES/DRESSINGS) IMPLANT
BNDG GAUZE ELAST 4 BULKY (GAUZE/BANDAGES/DRESSINGS) IMPLANT
CANISTER SUCT 3000ML PPV (MISCELLANEOUS) ×2 IMPLANT
CANISTER WOUND CARE 500ML ATS (WOUND CARE) ×2 IMPLANT
CLIP LIGATING EXTRA MED SLVR (CLIP) ×2 IMPLANT
CLIP LIGATING EXTRA SM BLUE (MISCELLANEOUS) ×2 IMPLANT
COVER WAND RF STERILE (DRAPES) ×1 IMPLANT
DRSG VAC ATS MED SENSATRAC (GAUZE/BANDAGES/DRESSINGS) IMPLANT
DRSG VAC ATS SM SENSATRAC (GAUZE/BANDAGES/DRESSINGS) ×1 IMPLANT
ELECT REM PT RETURN 9FT ADLT (ELECTROSURGICAL) ×2
ELECTRODE REM PT RTRN 9FT ADLT (ELECTROSURGICAL) ×1 IMPLANT
GAUZE SPONGE 4X4 12PLY STRL (GAUZE/BANDAGES/DRESSINGS) ×1 IMPLANT
GLOVE SS BIOGEL STRL SZ 7.5 (GLOVE) ×1 IMPLANT
GLOVE SUPERSENSE BIOGEL SZ 7.5 (GLOVE) ×1
GOWN STRL REUS W/ TWL LRG LVL3 (GOWN DISPOSABLE) ×3 IMPLANT
GOWN STRL REUS W/TWL LRG LVL3 (GOWN DISPOSABLE) ×6
KIT BASIN OR (CUSTOM PROCEDURE TRAY) ×2 IMPLANT
NS IRRIG 1000ML POUR BTL (IV SOLUTION) ×2 IMPLANT
PACK GENERAL/GYN (CUSTOM PROCEDURE TRAY) ×2 IMPLANT
PACK UNIVERSAL I (CUSTOM PROCEDURE TRAY) ×2 IMPLANT
PAD ARMBOARD 7.5X6 YLW CONV (MISCELLANEOUS) ×4 IMPLANT
STAPLER VISISTAT 35W (STAPLE) IMPLANT
SUT ETHILON 3 0 PS 1 (SUTURE) IMPLANT
SUT VIC AB 2-0 CTX 36 (SUTURE) IMPLANT
SUT VIC AB 3-0 SH 27 (SUTURE)
SUT VIC AB 3-0 SH 27X BRD (SUTURE) IMPLANT
TOWEL GREEN STERILE (TOWEL DISPOSABLE) ×2 IMPLANT
WATER STERILE IRR 1000ML POUR (IV SOLUTION) ×2 IMPLANT

## 2018-07-22 NOTE — Op Note (Signed)
    OPERATIVE REPORT  DATE OF SURGERY: 07/22/2018  PATIENT: Priscilla [name], 47 y.o. female MRN: [id number]  DOB: October 27, 1970  PRE-OPERATIVE DIAGNOSIS: Right groin infection  POST-OPERATIVE DIAGNOSIS:  Same  PROCEDURE: Debridement of fat necrosis right groin and placement of VAC dressing  SURGEON:  Curt Jews, M.D.  PHYSICIAN ASSISTANT: Nurse  ANESTHESIA: General  EBL: per anesthesia record  Total I/O In: 300 [I.V.:300] Out: -   BLOOD ADMINISTERED: none  DRAINS: none  SPECIMEN: none  COUNTS CORRECT:  YES  PATIENT DISPOSITION:  PACU - hemodynamically stable  PROCEDURE DETAILS: Patient was taken the operating placed supine system of the area of the right groin was prepped draped in usual sterile fashion.  Fat necrosis was present in the wound and this was all debrided.  There were some Vicryl sutures as well and these were debrided as well.  The artery and vein were not exposed.  All necrotic debris was removed and the wound was irrigated with saline and hemostasis to electrocautery.  A VAC drain was placed in the wound and hooked to suction.  She was transferred to the recovery room in stable condition   Priscilla [name], M.D., [facility] 07/22/2018 12:41 PM

## 2018-07-22 NOTE — Progress Notes (Signed)
PT Cancellation Note  Patient Details Name: Priscilla [name] MRN: [id number] DOB: 1970-11-14   Cancelled Treatment:    Reason Eval/Treat Not Completed: Patient at procedure or test/unavailable. Per chart, to OR for wound debridement and potential VAC placement. Will follow-up for PT treatment as schedule permits.  Mabeline Caras, PT, DPT Acute Rehabilitation Services  Pager [phone] Office [place] 07/22/2018, 11:29 AM

## 2018-07-22 NOTE — Anesthesia Procedure Notes (Signed)
Procedure Name: Intubation Date/Time: 07/22/2018 12:04 PM Performed by: Monette Omara T, CRNA Pre-anesthesia Checklist: Patient identified, Emergency Drugs available, Suction available and Patient being monitored Patient Re-evaluated:Patient Re-evaluated prior to induction Oxygen Delivery Method: Circle system utilized Preoxygenation: Pre-oxygenation with 100% oxygen Induction Type: IV induction Ventilation: Mask ventilation without difficulty Laryngoscope Size: Mac and 3 Grade View: Grade II Tube type: Oral Tube size: 7.5 mm Number of attempts: 1 Airway Equipment and Method: Patient positioned with wedge pillow and Stylet Placement Confirmation: ETT inserted through vocal cords under direct vision,  positive ETCO2 and breath sounds checked- equal and bilateral Secured at: 21 cm Tube secured with: Tape Dental Injury: Teeth and Oropharynx as per pre-operative assessment

## 2018-07-22 NOTE — Progress Notes (Signed)
CBG 177. 

## 2018-07-22 NOTE — Anesthesia Preprocedure Evaluation (Signed)
Anesthesia Evaluation  Patient identified by MRN, date of birth, ID band Patient awake  General Assessment Comment:Beta blocker held for hypotension  Reviewed: Allergy & Precautions, NPO status , Patient's Chart, lab work & pertinent test results, reviewed documented beta blocker date and time   Airway Mallampati: II  TM Distance: >3 FB Neck ROM: Full    Dental  (+) Teeth Intact   Pulmonary    breath sounds clear to auscultation       Cardiovascular hypertension, Pt. on medications and Pt. on home beta blockers + CAD, + Past MI, + CABG and +CHF  + Valvular Problems/Murmurs  Rhythm:Irregular     Neuro/Psych negative neurological ROS  negative psych ROS   GI/Hepatic GERD  Controlled,  Endo/Other  diabetes  Renal/GU ESRF and DialysisRenal disease     Musculoskeletal   Abdominal   Peds  Hematology  (+) Blood dyscrasia, anemia , leukocytosis   Anesthesia Other Findings S/p CABG/MVR Peritoneal dialysis last night  Reproductive/Obstetrics                             Anesthesia Physical Anesthesia Plan  ASA: III  Anesthesia Plan: General   Post-op Pain Management:    Induction: Intravenous and Rapid sequence  PONV Risk Score and Plan: 3 and Ondansetron and Dexamethasone  Airway Management Planned: Oral ETT  Additional Equipment: None  Intra-op Plan:   Post-operative Plan: Extubation in OR  Informed Consent: I have reviewed the patients History and Physical, chart, labs and discussed the procedure including the risks, benefits and alternatives for the proposed anesthesia with the patient or authorized representative who has indicated his/her understanding and acceptance.     Dental advisory given  Plan Discussed with: CRNA and Surgeon  Anesthesia Plan Comments:         Anesthesia Quick Evaluation

## 2018-07-22 NOTE — Progress Notes (Signed)
[facility] Progress Note  Priscilla [name] 48 y.o. 1970/05/02   Subjective: Feels good. Denies abd pain, N/V, rectal bleeding.  Objective: Vital signs: Vitals:   07/21/18 1903 07/22/18 0436  BP: 101/70 106/74  Pulse: 92 88  Resp: 16   Temp: 98.4 F (36.9 C) 98 F (36.7 C)  SpO2: 100% 100%    Physical Exam: Gen: alert, no acute distress  HEENT: anicteric sclera CV: RRR Chest: CTA B Abd: soft, nontender, nondistended, +BS Ext: no edema  Lab Results: Recent Labs    07/22/18 0347 07/22/18 0949  NA 133* 135  K 3.6 3.6  CL 96* 97*  CO2 25 26  GLUCOSE 169* 129*  BUN 25* 23*  CREATININE 7.84* 7.58*  CALCIUM 8.1* 8.0*  PHOS 3.7  --    Recent Labs    07/22/18 0347  ALBUMIN 1.8*   Recent Labs    07/21/18 1614 07/22/18 0347  WBC 27.1* 20.6*  HGB 9.1* 8.8*  HCT 27.7* 27.5*  MCV 87.7 88.1  PLT 336 335      Assessment/Plan: Gastric ulcer bleed - Hgb increase to 9.1 from 6.7 following 1 unit PRBCs. Hgb 8.8 today. No signs of ongoing bleeding. If Hgb stable tomorrow consider restarting ASA/Plavix if needed. Patient going to OR today for wound debridement. Continue PPI PO BID.   [name] 07/22/2018, 11:12 AM  Questions please call [phone] ID: Priscilla [name], female   DOB: April 10, 1971, 48 y.o.   MRN: [id number]

## 2018-07-22 NOTE — Progress Notes (Addendum)
[facility] Progress Note   Dialysis Orders: PD [facility] EDW 54 kg 6 total exchanges- 5 on the cycler 2 L fill vol 1 hr 30 min dwell time and 1 pause of 1500 mL, 4 hrs, dry day.  Assessment/Plan: 1. Upper GIB - EGD x2due to clotted blood in stomach. On 2nd egd 3/16had Rx of gastric ulcers w/ stigmata of bleeding x2.New hematemesis3/20. Seen by GI,asa/ plavixheld and resumed IV PPI- repeat EGD 3/22 - had epi and bicap of oozing gastric ulcer. 2. Anemia abl/ ckd - sp 2up prbc's on 3/15. Hgb 6.7 > 8.8 after 1 unit PRBC 3/24 .Aranesp 100q Friday. Low tsat12%onIV Fe load.- if hgb doesn't trend up will ^ ESA dose on Friday - hard to measure responsiveness during GIB 3. Hypotension - due to bleed/ anemia.BP'smarginalBP meds on hold. 4. Volume- net UF 936this am BP still on soft side but improving. Willcontinue to use2.5%  5. ESRD on CCPD - cont PD nightly  6. CAD hx CABG/ MV repair 7. HTN -holding BP meds 8. DM2 on insulin 9. Hypomagnesemia - very low1.1, got 6gm IV Mg 3/19-20, level better1.8 3/22 10. Hyperkalemia -K3.6on  20 mEq KCL/ day po. 11. Right groin wound/PVD - infection - post AVF repair 3/10 plavix/asa on hold- + Kleb pneumo- changed to Ancef- Persistent leukocytosis 12. Hx pancreas transplant 2012 - on creon for insufficiency 13. Hx mitral valve repar/PFO closure - CABG x 2 - 14. Nutrition alb 1.8 - encouraged protein /discussed strategies - npo for possible procedure - will need to change to regular to promote better intake   [name], PA-C [place] 07/22/2018,8:34 AM  LOS: 10 days   Subjective:   Feels better. Less cough Worked with PT.  Said VVS may have to take her back to the OR for further debridement.  Weights up.  BP now 120s.  No problems with PD net UF lower last night 936 ml.   Objective Vitals:   07/21/18 1424 07/21/18 1903 07/22/18 0436 07/22/18 0438  BP:  101/70 106/74    Pulse: 88 92 88   Resp: 15 16    Temp: 98 F (36.7 C) 98.4 F (36.9 C) 98 F (36.7 C)   TempSrc: Oral Oral Oral   SpO2: 100% 100% 100%   Weight:  58.2 kg  60.3 kg  Height:       Physical Exam General: NAD engaged,  breathing easily Heart:  RRR Extremities: no sig edema SCDs in place Dialysis Access:  PD cath   Additional Objective Labs: Basic Metabolic Panel: Recent Labs  Lab 07/17/18 0438 07/18/18 0014 07/19/18 0646 07/20/18 0618 07/22/18 0347  NA 136 135 136 135 133*  K 3.4* 5.5* 4.0 3.7 3.6  CL 103 99 101 100 96*  CO2 24 20* 23 22 25   GLUCOSE 294* 217* 145* 174* 169*  BUN 27* 28* 24* 25* 25*  CREATININE 8.01* 8.58* 7.84* 8.01* 7.84*  CALCIUM 7.7* 8.1* 7.6* 7.7* 8.1*  PHOS 3.1 2.8  --   --  3.7   Liver Function Tests: Recent Labs  Lab 07/18/18 0014 07/22/18 0347  ALBUMIN 1.9* 1.8*   No results for input(s): LIPASE, AMYLASE in the last 168 hours. CBC: Recent Labs  Lab 07/16/18 0858  07/17/18 2339 07/18/18 0321  07/19/18 1917 07/20/18 0618 07/21/18 0334 07/21/18 1614 07/22/18 0347  WBC 14.9*   < > 24.8* 23.3*   < > 30.2* 23.9* 23.0* 27.1* 20.6*  NEUTROABS 11.5*  --  21.1* 19.0*  --   --   --   --   --   --   HGB 7.8*   < > 7.5* 7.0*   < > 7.5* 7.1* 6.7* 9.1* 8.8*  HCT 24.0*   < > 22.4* 21.8*   < > 23.3* 22.3* 20.6* 27.7* 27.5*  MCV 87.6   < > 87.8 87.2   < > 88.3 89.2 89.2 87.7 88.1  PLT 242   < > 269 259   < > 308 335 340 336 335   < > = values in this interval not displayed.   Blood Culture    Component Value Date/Time   SDES ABSCESS 07/19/2018 1127   SPECREQUEST Normal 07/19/2018 1127   CULT ABUNDANT KLEBSIELLA PNEUMONIAE 07/19/2018 1127   REPTSTATUS 07/21/2018 FINAL 07/19/2018 1127    Cardiac Enzymes: No results for input(s): CKTOTAL, CKMB, CKMBINDEX, TROPONINI in the last 168 hours. CBG: Recent Labs  Lab 07/21/18 1613 07/21/18 2031 07/21/18 2340 07/22/18 0432 07/22/18 0752  GLUCAP 100* 81 177* 174* 213*   Iron Studies: No  results for input(s): IRON, TIBC, TRANSFERRIN, FERRITIN in the last 72 hours. Lab Results  Component Value Date   INR 1.3 (H) 07/12/2018   INR 1.2 07/07/2018   INR 1.1 06/30/2018   Studies/Results: No results found. Medications: . sodium chloride 10 mL (07/21/18 1013)  .  ceFAZolin (ANCEF) IV Stopped (07/21/18 2132)  . dialysis solution 2.5% low-MG/low-CA    . dialysis solution 2.5% low-MG/low-CA    . erythromycin 250 mL/hr at 07/18/18 1222  . ferric gluconate (FERRLECIT/NULECIT) IV Stopped (07/20/18 1359)   . amitriptyline  10 mg Oral QHS  . atorvastatin  10 mg Oral QHS  . [START ON 07/24/2018] darbepoetin (ARANESP) injection - NON-DIALYSIS  100 mcg Subcutaneous Q Fri-1800  . feeding supplement (NEPRO CARB STEADY)  237 mL Oral TID BM  . gabapentin  100 mg Oral TID  . gentamicin cream  1 application Topical Daily  . insulin aspart  1-5 Units Subcutaneous Q4H  . insulin NPH Human  10 Units Subcutaneous QPM  . latanoprost  1 drop Both Eyes QHS  . lipase/protease/amylase  108,000 Units Oral TID WC  . lipase/protease/amylase  72,000 Units Oral With snacks  . multivitamin  1 tablet Oral QHS  . pantoprazole  40 mg Oral BID AC  . potassium chloride  20 mEq Oral Daily      I have seen this patient and agree with plan and assessment in the above note with renal recommendations/intervention highlighted.  Awaiting decision by GI whether she needs another egd.  VVS to perform surgical debridement and wound vac placement.  Broadus John A Breyonna Nault,MD 07/22/2018 11:21 AM

## 2018-07-22 NOTE — Progress Notes (Signed)
Patient ID: Priscilla [name], female   DOB: [birth year], 48 y.o.   MRN: [id number] Right groin dressing removed.  Extensive fat necrosis. Discussed options with patient.  I feel that it would take quite a great period of local wound care to get to the point where the fat necrosis is all gone.  Would recommend operative debridement and potential VAC placement. Patient understands and agrees with this plan

## 2018-07-22 NOTE — Transfer of Care (Signed)
Immediate Anesthesia Transfer of Care Note  Patient: Priscilla [name]  Procedure(s) Performed: GROIN DEBRIDEMENT , POSSIBLE VAC (Right Groin)  Patient Location: PACU  Anesthesia Type:General  Level of Consciousness: drowsy  Airway & Oxygen Therapy: Patient Spontanous Breathing and Patient connected to nasal cannula oxygen  Post-op Assessment: Report given to RN, Post -op Vital signs reviewed and stable and Patient moving all extremities  Post vital signs: Reviewed  Last Vitals:  Vitals Value Taken Time  BP 118/77 07/22/2018  1:08 PM  Temp 36.5 C 07/22/2018  1:08 PM  Pulse 70 07/22/2018  1:10 PM  Resp 11 07/22/2018  1:16 PM  SpO2 71 % 07/22/2018  1:10 PM  Vitals shown include unvalidated device data.  Last Pain:  Vitals:   07/22/18 0820  TempSrc:   PainSc: 5       Patients Stated Pain Goal: 0 (50/38/88 2800)  Complications: No apparent anesthesia complications

## 2018-07-23 ENCOUNTER — Encounter (HOSPITAL_COMMUNITY): Payer: Self-pay | Admitting: Vascular Surgery

## 2018-07-23 DIAGNOSIS — Z978 Presence of other specified devices: Secondary | ICD-10-CM

## 2018-07-23 LAB — CBC
HCT: 27.8 % — ABNORMAL LOW (ref 36.0–46.0)
HEMOGLOBIN: 9.2 g/dL — AB (ref 12.0–15.0)
MCH: 29.3 pg (ref 26.0–34.0)
MCHC: 33.1 g/dL (ref 30.0–36.0)
MCV: 88.5 fL (ref 80.0–100.0)
Platelets: 342 10*3/uL (ref 150–400)
RBC: 3.14 MIL/uL — ABNORMAL LOW (ref 3.87–5.11)
RDW: 15.9 % — ABNORMAL HIGH (ref 11.5–15.5)
WBC: 18 10*3/uL — ABNORMAL HIGH (ref 4.0–10.5)
nRBC: 0.6 % — ABNORMAL HIGH (ref 0.0–0.2)

## 2018-07-23 LAB — GLUCOSE, CAPILLARY
Glucose-Capillary: 147 mg/dL — ABNORMAL HIGH (ref 70–99)
Glucose-Capillary: 148 mg/dL — ABNORMAL HIGH (ref 70–99)
Glucose-Capillary: 154 mg/dL — ABNORMAL HIGH (ref 70–99)
Glucose-Capillary: 155 mg/dL — ABNORMAL HIGH (ref 70–99)
Glucose-Capillary: 176 mg/dL — ABNORMAL HIGH (ref 70–99)
Glucose-Capillary: 179 mg/dL — ABNORMAL HIGH (ref 70–99)
Glucose-Capillary: 207 mg/dL — ABNORMAL HIGH (ref 70–99)

## 2018-07-23 MED ORDER — GENTAMICIN SULFATE 0.1 % EX CREA
1.0000 "application " | TOPICAL_CREAM | Freq: Every day | CUTANEOUS | Status: DC
  Administered 2018-07-23: 1 via TOPICAL
  Filled 2018-07-23: qty 15

## 2018-07-23 MED ORDER — ASPIRIN 81 MG PO CHEW
81.0000 mg | CHEWABLE_TABLET | Freq: Every day | ORAL | Status: DC
  Administered 2018-07-23 – 2018-07-24 (×2): 81 mg via ORAL
  Filled 2018-07-23 (×2): qty 1

## 2018-07-23 MED ORDER — CLOPIDOGREL BISULFATE 75 MG PO TABS: 75.0000 mg | ORAL_TABLET | Freq: Every day | ORAL | Status: DC

## 2018-07-23 MED ORDER — DELFLEX-LC/2.5% DEXTROSE 394 MOSM/L IP SOLN: INTRAPERITONEAL | Status: DC

## 2018-07-23 MED ORDER — OXYCODONE HCL 5 MG PO TABS
5.0000 mg | ORAL_TABLET | Freq: Four times a day (QID) | ORAL | Status: DC | PRN
  Administered 2018-07-23 – 2018-07-24 (×2): 5 mg via ORAL
  Filled 2018-07-23 (×2): qty 1

## 2018-07-23 NOTE — Progress Notes (Signed)
Orthopedic Tech Progress Note Patient Details:  Priscilla [name] [birth year] [id number]  Patient ID: Priscilla [name], female   DOB: [birth year], 48 y.o.   MRN: [id number]  Called in order to [place] 07/23/2018, 2:34 PM

## 2018-07-23 NOTE — Progress Notes (Signed)
Subjective: [name] reports that she is feeling tired today, no acute events overnight. She reports that she is having some pain in her right groin area but states that the foul smell has gone away. She denies any recurrence of hematemesis, she does report that she had some nausea after the morphine wore off yesterday, denies any nausea or vomiting today. We had a discussion about the risks and benefits of restarting her aspirin and plavix. We discussed that if we were to restart it she would be at a high risk of bleeding given her recent episodes however if we continued to hold it then she would be at risk of developing clots or worsening of her foot ulcer. She reported that she would rather have better blood flow and that she would like to try the aspirin and plavix again and see how she does. All other questions were answered.   Objective:  Vital signs in last 24 hours: Vitals:   07/22/18 1732 07/22/18 2142 07/23/18 0520 07/23/18 0650  BP:  97/65 105/69 96/65  Pulse:  67 84 85  Resp:  16 16 16   Temp:  97.9 F (36.6 C) 98.1 F (36.7 C) 98.2 F (36.8 C)  TempSrc:  Oral Oral Oral  SpO2: 100% 98% 98% 99%  Weight:    56.7 kg  Height:        General: Well appearing, NAD, sitting comfortably in bed Cardiac: RRR, no m/r/g Pulmonary: CTABL, no wheezing Abdomen: Soft, non-tender, right groin with wound vac in place draining serosanguinous fluid, no erythema, tenderness to palpation Extremity: 1+ peripheral pulses, ulceration on distal aspect of right 1st digit    Assessment/Plan:  Principal Problem:   GI bleed Active Problems:   ESRD (end stage renal disease) (HCC)   Diabetes mellitus type 1 ([place])   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 She received epineprhine injection, cautery, and endo clip placement. Transferred out of ICU on 3/20. She had been restarted on her ASA and plavix but then had a recurrence of hematemesis the night on 3/20.GI took her for endoscopy that showed the non-bleeding gastric ulcer with previously placed 2 endoclips with no active bleeding, with localized severely erythematous mucosa with bleeding in the gastric body, injected with epinephrine for hemostasis and coagulation with bipolar probe.Her Hgb has been stable and she has not had any episodes of hematemesis. After extensive discussion about the risks and benefits of restarting her anticogulation she reported that she would like to try them again.   -GI following, appreciate recommendations -Restart ASA and plavix, monitor for bleeding -CBCdaily -Continue protonixBID -S/p erythromycin IV x1  Right groin AVF infection:S/p bilateral iliac stents on 09/19/2017, found to have a right groin AVF and underwent repair on 07/07/18. She has some purulent, foul smelling drainage out of her wound, no erythema or edema around the area. Patient went for debridement yesterday which she tolerated well. Her leukocytosis has improved and she has been afebrile. Will continue antibiotics for now. -Continue cefazolin (day 5) -Wet to dry dressings -Vascular following, appreciate recommendations  Pancreatic insufficiency: -Patient has a history of pancreas  transplant in Jan 2012, she is on Creon at home for this. She denies any diarrhea or issues.  -Continue Creon  ESRD on PD: -Nephrology following, appreciate recommendations -Continue PD overnight  PVD: Patient has had bilateral common iliac artery angioplasty and stenting, she is on ASA and plavix for this.  -Restarting ASA and Plavix, monitoring for bleeds  Ischemic cardiomyopathy  Hx severe MR s/p mitral valve repair Hx PFO s/p closure HTN:  S/p CABG x2. She is on coreg, lorartan, and clonidine at home. These were held due to her UGIB causing hypotension requiring pressors. Continue to monitor and restart as needed.   DM type 1: -She is on NPH 20 units QHS and humalog 1-10 units TID AC at home.  -Continue NPH 10 units and custom SSI -Frequent CBGs  FEN:No fluids, replete lytes prn, NPO VTE ppx: SCDs Code Status: FULL   Dispo: Anticipated discharge in approximately 1-2 day(s).   Asencion Noble, MD 07/23/2018, 10:32 AM Pager: [phone]

## 2018-07-23 NOTE — Progress Notes (Signed)
Physical Therapy Treatment Patient Details Name: Priscilla [name] MRN: [id number] DOB: 1970/06/17 Today's Date: 07/23/2018    History of Present Illness Pt presents for EGD for GI bleed with PMH: end-stage renal disease on peritoneal dialysis, status post CABG, kidney and pancreatic transplant in 2012, mitral valve repair, ischemic cardiomyopathy 2/16, diabetes,recentrepair of right common femoral artery 2 femoral vein fistula with pain patch angioplasty on 07/07/2018.  Debridement of right groin with vac placement on 07/22/2018.     PT Comments    Pt admitted with above diagnosis. Pt currently with functional limitations due to balance and endurance deficits. Pt was able to sit EOB x 5 min with min guard assist.  Exercised.  Pt slightly dizzy with BP slightly soft.  Will progress OOB next visit.  Pt will benefit from skilled PT to increase their independence and safety with mobility to allow discharge to the venue listed below.     Follow Up Recommendations  Home health PT     Equipment Recommendations  Rolling walker with 5" wheels    Recommendations for Other Services       Precautions / Restrictions Precautions Precautions: Fall Precaution Comments: has L partial foot drop, was in the process of working with outpt PT and getting AFO Restrictions Weight Bearing Restrictions: No    Mobility  Bed Mobility Overal bed mobility: Needs Assistance Bed Mobility: Supine to Sit     Supine to sit: Supervision     General bed mobility comments: increased time and effort to get to EOB but no physical assist needed  Transfers Overall transfer level: Needs assistance Equipment used: 2 person hand held assist Transfers: Sit to/from Stand Sit to Stand: Mod assist;+2 physical assistance         General transfer comment: mod A to steady, lost balance posteriorly and needed +2 HHA to steady and take a few steps to [facility]   Ambulation/Gait             General Gait Details:  deferred due to pt feeling dizzy, decided to sit EOB today.  BP 93/62.     Stairs             Wheelchair Mobility    Modified Rankin (Stroke Patients Only)       Balance Overall balance assessment: Needs assistance Sitting-balance support: No upper extremity supported Sitting balance-Leahy Scale: Fair     Standing balance support: Bilateral upper extremity supported Standing balance-Leahy Scale: Poor Standing balance comment: reliant on UE support at this point,                             Cognition Arousal/Alertness: Awake/alert Behavior During Therapy: WFL for tasks assessed/performed Overall Cognitive Status: Within Functional Limits for tasks assessed                                        Exercises General Exercises - Lower Extremity Ankle Circles/Pumps: AROM;Both;20 reps;Seated;Supine Long Arc Quad: AROM;Both;10 reps;Seated Hip Flexion/Marching: AROM;Both;15 reps;Standing    General Comments        Pertinent Vitals/Pain Pain Assessment: Faces Faces Pain Scale: Hurts little more Pain Location: infected wound R thigh Pain Descriptors / Indicators: Sore Pain Intervention(s): Limited activity within patient's tolerance;Monitored during session;Repositioned    Home Living  Prior Function            PT Goals (current goals can now be found in the care plan section) Acute Rehab PT Goals Patient Stated Goal: return home Progress towards PT goals: Progressing toward goals    Frequency    Min 3X/week      PT Plan Discharge plan needs to be updated    Co-evaluation              AM-PAC PT "6 Clicks" Mobility   Outcome Measure  Help needed turning from your back to your side while in a flat bed without using bedrails?: None Help needed moving from lying on your back to sitting on the side of a flat bed without using bedrails?: A Little Help needed moving to and from a bed to a chair  (including a wheelchair)?: A Little Help needed standing up from a chair using your arms (e.g., wheelchair or bedside chair)?: A Lot Help needed to walk in hospital room?: A Lot Help needed climbing 3-5 steps with a railing? : Total 6 Click Score: 15    End of Session Equipment Utilized During Treatment: Gait belt Activity Tolerance: Patient limited by fatigue Patient left: with call bell/phone within reach;in bed Nurse Communication: Mobility status PT Visit Diagnosis: Unsteadiness on feet (R26.81);Muscle weakness (generalized) (M62.81)     Time: 3338-3291 PT Time Calculation (min) (ACUTE ONLY): 23 min  Charges:  $Gait Training: 8-22 mins $Therapeutic Exercise: 8-22 mins                     [place] Pager:  [phone]  Office:  [place] 07/23/2018, 12:59 PM

## 2018-07-23 NOTE — Progress Notes (Signed)
Nutrition Follow-up  RD working remotely.  DOCUMENTATION CODES:   Non-severe (moderate) malnutrition in context of acute illness/injury(likely component of chronic malnutrition as well)  INTERVENTION:    Liberalize diet to regular  Continue Rena-vit  Continue Creon  NUTRITION DIAGNOSIS:   Moderate Malnutrition related to acute illness(acute blood loss anemia with UGIB and associated GI symptoms) as evidenced by energy intake < or equal to 50% for > or equal to 5 days, mild fat depletion, mild muscle depletion.  Ongoing   GOAL:   Patient will meet greater than or equal to 90% of their needs  Progressing  MONITOR:   PO intake, Supplement acceptance, Labs, Weight trends  ASSESSMENT:   48 yo female admitted with acute blood loss anemia secondary to UGIB.  PMH includes ESRD on PD with hx of kidney and  pancreatic transplant, mitral repair and CABG x 2, brittle DM   S/P debridement of fat necrosis right groin with placement of VAC 3/25. She was NPO most of the day yesterday. Patient is back on a renal CHO modified diet with 1200 ml fluid restriction. Intake of meals is variable.   Patient reports improving appetite. She is not able to get some foods because she is back on a renal diet. She has had no diarrhea in 3 days and only one low blood sugar in the past few days. She has not been drinking the Nepro supplements because they make her stomach upset. She plans to try Nepro at home with Creon.   Patient continues to receive PD nightly.  Labs reviewed. Sodium 133 (L), potassium 3.6 (WNL), creatinine 7.84 (H), phosphorus 3.7 (WNL) CBG's: [phone] today  Medications reviewed and include aranesp, novolog SSI every 4 hours, NPH insulin 10 units every evening, Creon, Rena-vit, potassium chloride tablet.  Diet Order:   Diet Order            Diet renal/carb modified with fluid restriction Diet-HS Snack? Nothing; Fluid restriction: 1200 mL Fluid; Room service appropriate?  Yes; Fluid consistency: Thin  Diet effective now              EDUCATION NEEDS:   Education needs have been addressed  Skin:   Wound Vac: groin Diabetic Ulcer: foot  Last BM:  3/23  Height:   Ht Readings from Last 1 Encounters:  07/22/18 5' (1.524 m)    Weight:   Wt Readings from Last 1 Encounters:  07/23/18 56.7 kg  EDW   54 kg Admission wt 53.5 kg  BMI:  Body mass index is 24.39 kg/m.  Estimated Nutritional Needs:   Kcal:  1950-2160 kcals   Protein:  98-108 g   Fluid:  1.9-2.1 L    Molli Barrows, RD, LDN, CNSC Pager [phone] After Hours Pager [phone]

## 2018-07-23 NOTE — Discharge Summary (Signed)
Name: Priscilla [name] MRN: [id number] DOB: 1970/10/30 48 y.o. PCP: [name], MD  Date of Admission: 07/12/2018  2:06 PM Date of Discharge: 07/24/18 Attending Physician: Axel Filler, *  Discharge Diagnosis: 1. UGIB 2/2 gastric ulcer 2. Right groin surgical wound infection 3. Hx of pancreatic insufficieny, failed pancreatic transplant 4. ESRD of PD 5. PVD 6. Hx of Ischemic cardiomyopathy, severe MR s/p mitral valve repair, PFO s/p closure, HTN 7. DM type 1  Discharge Medications: Allergies as of 07/24/2018      Reactions   Pork-derived Products Other (See Comments)   Pt is [place]      Medication List    STOP taking these medications   aspirin 325 MG tablet Replaced by:  aspirin 81 MG chewable tablet   carvedilol 6.25 MG tablet Commonly known as:  COREG   cloNIDine 0.1 MG tablet Commonly known as:  CATAPRES   clopidogrel 75 MG tablet Commonly known as:  PLAVIX   losartan 100 MG tablet Commonly known as:  COZAAR   oxyCODONE-acetaminophen 5-325 MG tablet Commonly known as:  PERCOCET/ROXICET     TAKE these medications   acetaminophen 325 MG tablet Commonly known as:  TYLENOL Take 650 mg by mouth every 4 (four) hours as needed for mild pain.   amitriptyline 10 MG tablet Commonly known as:  ELAVIL Take 10 mg by mouth at bedtime.   amoxicillin-clavulanate 500-125 MG tablet Commonly known as:  AUGMENTIN Take 1 tablet (500 mg total) by mouth daily.   aspirin 81 MG chewable tablet Chew 1 tablet (81 mg total) by mouth daily. Start taking on:  July 25, 2018 Replaces:  aspirin 325 MG tablet   atorvastatin 10 MG tablet Commonly known as:  LIPITOR Take 10 mg by mouth at bedtime.   calcitRIOL 0.5 MCG capsule Commonly known as:  ROCALTROL Take 0.5 mcg by mouth at bedtime.   calcium acetate 667 MG capsule Commonly known as:  PHOSLO Take 2,001 mg by mouth See admin instructions. Take 3 capsules (2001 mg) by mouth three times daily with meals,  take 2 capsules (1334 mg) with snacks   Creon 36000 UNITS Cpep capsule Generic drug:  lipase/protease/amylase Take 72,000-108,000 Units by mouth See admin instructions. Take 3 capsules (108,000 units) by mouth three times daily with meals, take 2 capsules (72,000 units) with snacks   gabapentin 100 MG capsule Commonly known as:  NEURONTIN Take 100 mg by mouth 3 (three) times daily.   gentamicin cream 0.1 % Commonly known as:  GARAMYCIN Apply 1 application topically See admin instructions. Apply topically to dialysis site daily after showering   HYDROmorphone 2 MG tablet Commonly known as:  DILAUDID Take 1 tablet (2 mg total) by mouth every 6 (six) hours as needed for moderate pain or severe pain.   insulin lispro 100 UNIT/ML injection Commonly known as:  HUMALOG Inject 1-10 Units into the skin 3 (three) times daily before meals. Per sliding scale   insulin NPH Human 100 UNIT/ML injection Commonly known as:  HUMULIN N,NOVOLIN N Inject 0.1 mLs (10 Units total) into the skin at bedtime. What changed:  how much to take   levonorgestrel 20 MCG/24HR IUD Commonly known as:  MIRENA 1 each by Intrauterine route once. Implanted March 2018   loperamide 2 MG capsule Commonly known as:  IMODIUM Take 2 mg by mouth every 8 (eight) hours as needed for diarrhea or loose stools.   multivitamin Tabs tablet Take 1 tablet by mouth at bedtime.   potassium chloride  SA 20 MEQ tablet Commonly known as:  K-DUR,KLOR-CON Take 1 tablet (20 mEq total) by mouth daily. What changed:  when to take this   Travoprost (BAK Free) 0.004 % Soln ophthalmic solution Commonly known as:  TRAVATAN Place 1 drop into both eyes at bedtime.       Disposition and follow-up:   [name] was discharged from [facility] in Stable condition.  At the hospital follow up visit please address:  1.  UGIB: She was restarted on her ASA, please assess for further bleeding episodes. Repeat  labs.  Right groin AVF infection: She was discharged with 4 more days of augmentin, she should have home health care and follow up with vascular.  ESRD on PD: Please make sure that she has been able to do home PD nightly.  DM type 1: Her humalin was decreased to 10 units daily.   2.  Labs / imaging needed at time of follow-up: CBC, BMP  3.  Pending labs/ test needing follow-up: None  Follow-up Appointments: Follow-up Information    [name], MD. Schedule an appointment as soon as possible for a visit.   Specialty:  Internal Medicine Why:  Please make an appointment in 1-2 weeks Contact information: [place] [address] [phone]        Priscilla Klein, MD .   Specialty:  Cardiology Contact information: [address] [place] [address] [place], [facility] Follow up.   Specialty:  Home Health Services Why:  Registered Nurse, Physical Therapy Contact information: [place] [address] [place] [facility] Course by problem list: 1. UGIB 2/2 gastric ulcer: This is a 48 year old female with history of end-stage renal disease status post renal transplant failure on peritoneal dialysis, stable 1, mitral regurgitation status post valve repair, CABG x2, hypertension, PVD status post bilateral aortoiliac stenting who presented with an upper GIB that was found to 2/2 bleeding gastric ulcer while on DAPT. She was transferred to the ICU for pressor support and underwent EGD with epinephrine injections, cautery, and endoclip plagemetn. She was transferred of the ICU on 3/20. She was not having any recurrence of her GI bleed so ASA and Plavix were restarted. After restarting these she developed recurrence of her hematemesis. GI took her for another endoscopy that showed localized localized severely erythematous mucosa with bleeding in the gastric body, injected with epinephrine for hemostasis  and coagulation with bipolar probe. After discussed with vascular surgery and the patient it was decided to only restart her ASA. Her Hgb remained stable and she did not have any recurrence of her GI bleed. She was d/c'd on ASA and protonix BID.    2. Right groin surgical wound infection: S/p bilateral iliac stents on 09/19/2017, found to have a right groin AVF and underwent repair on 07/07/18. She was noted to have some purulent, foul smelling drainage out of her wound. Vascular was consulted and initially did bedside debridement, they took her to the OR the next day for further debridement on 3/25. She was on cefazolin and discharged with 4 more days of Augmentin to complete a 7 day course. She had a wound vac in place and HH was arranged.    3. Hx of pancreatic insufficieny, failed pancreatic transplant: She has a history of pancreas transplant in Jan 2012, on Creon at home for this. This was continued inpatient.  4. ESRD of PD: Nephrology was following and she was continued on her home PD.  5. PVD: Patient has had bilateral common iliac artery angioplasty and stenting, she was on ASA and plavix for this.  Per vascular surgery, it is reasonable to stop the Plavix and continue her aspirin. We continued ASA on discharge.  6. Hx of Ischemic cardiomyopathy, severe MR s/p mitral valve repair, PFO s/p closure, HTN: Her home Coreg, losartan, and clonidine were held during admission due to her UGIB and hemorrhagic shock requiring pressors. Medications were held on discharge due to soft blood pressures during admisison.  7. DM type 1: She was on NPH 20 daily and humalod 1-10 units TID AC at home. She was having some hypoglycemia and was put on NPH 10 units daily. Decreased NPH insulin to 10 units daily on discharge.   Discharge Vitals:   BP 109/72 (BP Location: Left Arm)    Pulse 73    Temp 98.3 F (36.8 C) (Oral)    Resp 16    Ht 5' (1.524 m)    Wt 57.9 kg    SpO2 100%    BMI 24.93 kg/m   Pertinent Labs,  Studies, and Procedures:  CBC Latest Ref Rng & Units 07/26/2018 07/24/2018 07/23/2018  WBC 4.0 - 10.5 K/uL 15.5(H) 15.1(H) 18.0(H)  Hemoglobin 12.0 - 15.0 g/dL 9.0(L) 8.9(L) 9.2(L)  Hematocrit 36.0 - 46.0 % 28.9(L) 27.2(L) 27.8(L)  Platelets 150 - 400 K/uL 327 335 342   BMP Latest Ref Rng & Units 07/26/2018 07/22/2018 07/22/2018  Glucose 70 - 99 mg/dL 183(H) 129(H) 169(H)  BUN 6 - 20 mg/dL 22(H) 23(H) 25(H)  Creatinine 0.44 - 1.00 mg/dL 8.32(H) 7.58(H) 7.84(H)  Sodium 135 - 145 mmol/L 134(L) 135 133(L)  Potassium 3.5 - 5.1 mmol/L 4.6 3.6 3.6  Chloride 98 - 111 mmol/L 96(L) 97(L) 96(L)  CO2 22 - 32 mmol/L 23 26 25   Calcium 8.9 - 10.3 mg/dL 8.2(L) 8.0(L) 8.1(L)   07/19/18 Upper EGD: - Normal esophagus. - Z-line regular, 38 cm from the incisors. - Non-bleeding gastric ulcer with pigmented material.Previously placed endoclips in place. - Erythematous mucosa with bleeding in the gastric body. Injected. Treated with bipolar cautery. - Non-bleeding gastric ulcers with a clean ulcer base (Forrest Class III). - Normal examined duodenum. - No specimens collected.  Discharge Instructions: Discharge Instructions    Call MD for:  difficulty breathing, headache or visual disturbances   Complete by:  As directed    Call MD for:  extreme fatigue   Complete by:  As directed    Call MD for:  hives   Complete by:  As directed    Call MD for:  persistant dizziness or light-headedness   Complete by:  As directed    Call MD for:  persistant nausea and vomiting   Complete by:  As directed    Call MD for:  redness, tenderness, or signs of infection (pain, swelling, redness, odor or green/yellow discharge around incision site)   Complete by:  As directed    Call MD for:  severe uncontrolled pain   Complete by:  As directed    Call MD for:  temperature >100.4   Complete by:  As directed    Diet - low sodium heart healthy   Complete by:  As directed    Discharge instructions   Complete by:  As  directed    Pearson [name],   It has been a pleasure working with you and we are glad  you're feeling better. You were hospitalized for bleeding from a stomach ulcer. The gastroenterologist did a procedure where the placed clips in your stomach to stop the bleeding. After discussing with the vascular surgeons they were okay with stopping your plavix and just continuing your aspirin. Please start taking protonix twice a day to decrease the risk of bleeding again.   You were also noted to have in infection of your right surgical wound, the vascular surgeons did a debridement to remove most of the infection. Please continue to take augmentin for 4 more days. Follow up with vascular in 1-2 weeks. You can start taking dilaudid for pain control.   For your diabetes, you can decrease your insulin to 10 units nightly. Please continue to check your blood glucose at home and follow up with your PCP.   Your blood pressure was low while you were here while we were holding your blood pressure medications. Please stop taking the clonidine, losarten and carvedilol. Please follow up with your PCP to determine if you need to restart these.   Follow up with your primary care provider in 1-2 weeks  If your symptoms worsen or you develop new symptoms, please seek medical help whether it is your primary care provider or emergency department.  If you have any questions about this hospitalization please call [phone].   Increase activity slowly   Complete by:  As directed       Signed: Asencion Noble, MD 07/24/2018, 1:59 PM   Pager: [phone]

## 2018-07-23 NOTE — Progress Notes (Signed)
[facility] Progress Note  Priscilla [name] 48 y.o. [birth year]   Subjective: Doing well. Denies N/V/abdominal pain/rectal bleeding/melena. Tolerating diet.  Objective: Vital signs: Vitals:   07/23/18 0650 07/23/18 1100  BP: 96/65   Pulse: 85 99  Resp: 16   Temp: 98.2 F (36.8 C)   SpO2: 99%     Physical Exam: Gen: alert, no acute distress  HEENT: anicteric sclera CV: RRR Chest: CTA B Abd: soft, nontender, nondistended, +BS Ext: no edema  Lab Results: Recent Labs    07/22/18 0347 07/22/18 0949  NA 133* 135  K 3.6 3.6  CL 96* 97*  CO2 25 26  GLUCOSE 169* 129*  BUN 25* 23*  CREATININE 7.84* 7.58*  CALCIUM 8.1* 8.0*  PHOS 3.7  --    Recent Labs    07/22/18 0347  ALBUMIN 1.8*   Recent Labs    07/22/18 0347 07/23/18 0302  WBC 20.6* 18.0*  HGB 8.8* 9.2*  HCT 27.5* 27.8*  MCV 88.1 88.5  PLT 335 342      Assessment/Plan: Gastric ulcer bleed - Hgb stable at 9.2. Ok to restart ASA/Plavix from a GI standpoint. Continue PPI PO BID. Will sign off. Call if questions. F/U with Dr. Therisa [name] in 4-6 weeks.   Priscilla [name] 07/23/2018, 1:03 PM  Questions please call [phone] ID: Priscilla [name], female   DOB: [birth year], 48 y.o.   MRN: [id number]

## 2018-07-23 NOTE — TOC Progression Note (Addendum)
Transition of Care [facility]) - Progression Note    Patient Details  Name: Priscilla [name] MRN: [id number] Date of Birth: 1970-11-23  Transition of Care [facility]) CM/SW Contact  Graves-Bigelow, Ocie Cornfield, RN Phone Number: 07/23/2018, 11:07 AM  Clinical Narrative:  CM did speak with patient regarding transition of care plans. Pt hx of ESRD on PD. From home with support of sister. Pt had wound vac placed 07-22-18 to right groin area. Question if patient needs wound vac for home. Pt is on creon-uses [employer] representative, Case Manager did call [place] to make sure creon would still be $5.00. Per pharmacy patient has a discount card on file. Patient was previously set up with [facility] via the vascular surgery- no orders needed. Pt will have RN/PT services. CM will continue to monitor for transition of care needs.   07-23-18 Plan for VAC change on 07-24-18 will follow to see if needs home wound vac.   Expected Discharge Plan: Home/Self Care Barriers to Discharge: Continued Medical Work up  Expected Discharge Plan and Services Expected Discharge Plan: Home/Self Care In-house Referral: NA Discharge Planning Services: CM Consult Post Acute Care Choice: NA Living arrangements for the past 2 months: Single Family Home                 DME Arranged: N/A DME Agency: NA HH Arranged: NA HH Agency: NA   Social Determinants of Health (SDOH) Interventions    Readmission Risk Interventions Readmission Risk Prevention Plan 07/17/2018  Transportation Screening Complete  PCP or Specialist Appt within 3-5 Days Not Complete  Not Complete comments Patient is being managed by Nephrology for outpatient appointment for ESRD  HRI or [place] Complete  Social Work Consult for [place] Planning/Counseling Not Complete  SW consult not completed comments Not appropriate; patient verbalized being independent and declined any additional needs  Palliative Care Screening Not  Applicable  Medication Review Press photographer) Complete  Some recent data might be hidden

## 2018-07-23 NOTE — Progress Notes (Addendum)
[facility] Progress Note   Dialysis Orders: PD [facility] EDW 54 kg 6 total exchanges- 5 on the cycler 2 L fill vol 1 hr 30 min dwell time and 1 pause of 1500 mL, 4 hrs, dry day.  Assessment/Plan: 1. Upper GIB - EGD x2due to clotted blood in stomach. On 2nd egd 3/16had Rx of gastric ulcers w/ stigmata of bleeding x2.New hematemesis3/20. Seen by GI,asa/ plavixheld and resumed IV PPI- repeat EGD 3/22 - had epi and bicap of oozing gastric ulcer. 2. Anemia abl/ ckd - sp 2up prbc's on 3/15.Hgb 6.7 > 8.8 > 9.2  after 1 unit PRBC3/24.Aranesp 100q Friday. Low tsat12%onIV Fe load. 3. Hypotension - .BP'smarginalBP meds on hold.use half 1.5 and 2.5 last night  4. Volume- net UF only 601 this am - post wt 56.5 - would expect to lose some weight I hospital 5. ESRD on CCPD - cont PD nightly - repeat BMP in am - cotinue half 1.5 and 2.5 unless BP up this pm 6. CAD hx CABG/ MV repair 7. DM2 on insulin 8. Hypomagnesemia - very low1.1, got 6gm IV Mg 3/19-20, level better1.8 3/22 9. Hyperkalemia -on  20 mEq KCL/ day po. 10. Right groin wound/PVD - infection - post AVF repair 3/10 plavix/asa on hold- + Kleb pneumo- changed to Ancef-  s/p debridement of fat necrosis and placement of VAC 3/25 11. Hx pancreas transplant 2012 - on creon for insufficiency 13. Hx mitral valve repar/PFO closure - CABG x 2 - 14. Nutrition alb 1.8 - encouraged protein /discussed strategies - npo for possible procedure - will need to change to regular to promote better intake   [name], PA-C [place] [phone] 07/23/2018,8:31 AM  LOS: 11 days   Subjective:   Right groin a little sore. No real complaints.  Objective Vitals:   07/22/18 1732 07/22/18 2142 07/23/18 0520 07/23/18 0650  BP:  97/65 105/69 96/65  Pulse:  67 84 85  Resp:  16 16 16   Temp:  97.9 F (36.6 C) 98.1 F (36.7 C) 98.2 F (36.8 C)  TempSrc:  Oral Oral Oral  SpO2: 100% 98% 98% 99%   Weight:    56.7 kg  Height:       Physical Exam General: NAD Heart: RRR Lungs: crackles left base Abdomen:soft VAC right groin, min dependent edema Extremities: no edema Dialysis Access:  PD cath   Additional Objective Labs: Basic Metabolic Panel: Recent Labs  Lab 07/17/18 0438 07/18/18 0014  07/20/18 0618 07/22/18 0347 07/22/18 0949  NA 136 135   < > 135 133* 135  K 3.4* 5.5*   < > 3.7 3.6 3.6  CL 103 99   < > 100 96* 97*  CO2 24 20*   < > 22 25 26   GLUCOSE 294* 217*   < > 174* 169* 129*  BUN 27* 28*   < > 25* 25* 23*  CREATININE 8.01* 8.58*   < > 8.01* 7.84* 7.58*  CALCIUM 7.7* 8.1*   < > 7.7* 8.1* 8.0*  PHOS 3.1 2.8  --   --  3.7  --    < > = values in this interval not displayed.   Liver Function Tests: Recent Labs  Lab 07/18/18 0014 07/22/18 0347  ALBUMIN 1.9* 1.8*   No results for input(s): LIPASE, AMYLASE in the last 168 hours. CBC: Recent Labs  Lab 07/16/18 8099  07/17/18 2339 07/18/18 0321  07/20/18 8338 07/21/18 2505 07/21/18 1614 07/22/18 0347 07/23/18 0302  WBC 14.9*   < > 24.8* 23.3*   < > 23.9* 23.0* 27.1* 20.6* 18.0*  NEUTROABS 11.5*  --  21.1* 19.0*  --   --   --   --   --   --   HGB 7.8*   < > 7.5* 7.0*   < > 7.1* 6.7* 9.1* 8.8* 9.2*  HCT 24.0*   < > 22.4* 21.8*   < > 22.3* 20.6* 27.7* 27.5* 27.8*  MCV 87.6   < > 87.8 87.2   < > 89.2 89.2 87.7 88.1 88.5  PLT 242   < > 269 259   < > 335 340 336 335 342   < > = values in this interval not displayed.   Blood Culture    Component Value Date/Time   SDES ABSCESS 07/19/2018 1127   SPECREQUEST Normal 07/19/2018 1127   CULT ABUNDANT KLEBSIELLA PNEUMONIAE 07/19/2018 1127   REPTSTATUS 07/21/2018 FINAL 07/19/2018 1127    Cardiac Enzymes: No results for input(s): CKTOTAL, CKMB, CKMBINDEX, TROPONINI in the last 168 hours. CBG: Recent Labs  Lab 07/22/18 1316 07/22/18 1728 07/22/18 2026 07/23/18 0021 07/23/18 0441  GLUCAP 81 85 154* 207* 147*   Iron Studies: No results for input(s):  IRON, TIBC, TRANSFERRIN, FERRITIN in the last 72 hours. Lab Results  Component Value Date   INR 1.3 (H) 07/12/2018   INR 1.2 07/07/2018   INR 1.1 06/30/2018   Studies/Results: No results found. Medications: . sodium chloride 10 mL (07/21/18 1013)  . sodium chloride 10 mL/hr at 07/22/18 1118  .  ceFAZolin (ANCEF) IV 500 mg (07/22/18 2146)  . dialysis solution 2.5% low-MG/low-CA    . dialysis solution 2.5% low-MG/low-CA    . erythromycin 250 mL/hr at 07/18/18 1222  . ferric gluconate (FERRLECIT/NULECIT) IV 125 mg (07/22/18 1504)   . amitriptyline  10 mg Oral QHS  . atorvastatin  10 mg Oral QHS  . [START ON 07/24/2018] darbepoetin (ARANESP) injection - NON-DIALYSIS  100 mcg Subcutaneous Q Fri-1800  . feeding supplement (NEPRO CARB STEADY)  237 mL Oral TID BM  . gabapentin  100 mg Oral TID  . gentamicin cream  1 application Topical Daily  . insulin aspart  1-5 Units Subcutaneous Q4H  . insulin NPH Human  10 Units Subcutaneous QPM  . latanoprost  1 drop Both Eyes QHS  . lipase/protease/amylase  108,000 Units Oral TID WC  . lipase/protease/amylase  72,000 Units Oral With snacks  . multivitamin  1 tablet Oral QHS  . pantoprazole  40 mg Oral BID AC  . potassium chloride  20 mEq Oral Daily      I have seen and examined this patient and agree with plan and assessment in the above note with renal recommendations/intervention highlighted.  Probably does not need plavix any longer as she had stent placed in May 2019 and per Dr. Luther Parody note, she only needed plavix for 6 months.  Also cont with low dose asa.  Disposition per primary svc.  Will continue with CCPD while she remains an inpatient.  Broadus John A Neeka Urista,MD 07/23/2018 11:05 AM

## 2018-07-23 NOTE — Anesthesia Postprocedure Evaluation (Signed)
Anesthesia Post Note  Patient: Priscilla [name]  Procedure(s) Performed: GROIN DEBRIDEMENT , POSSIBLE VAC (Right Groin)     Patient location during evaluation: PACU Anesthesia Type: General Level of consciousness: awake and alert Pain management: pain level controlled Vital Signs Assessment: post-procedure vital signs reviewed and stable Respiratory status: spontaneous breathing, nonlabored ventilation, respiratory function stable and patient connected to nasal cannula oxygen Cardiovascular status: blood pressure returned to baseline and stable Postop Assessment: no apparent nausea or vomiting Anesthetic complications: no    Last Vitals:  Vitals:   07/23/18 1800 07/23/18 2020  BP: 94/67 106/74  Pulse: 100 91  Resp: 17 17  Temp: 36.7 C 36.7 C  SpO2: 95% 100%    Last Pain:  Vitals:   07/23/18 2020  TempSrc: Oral  PainSc:                  [name]

## 2018-07-23 NOTE — Progress Notes (Signed)
   VASCULAR SURGERY ASSESSMENT & PLAN:   POD 1: Debridement of right groin wound and placement of VAC:  From my standpoint the patient could be discharged once the [facility] is approved and I can follow the wound as an outpatient.  GI BLEED: The patient presented with a GI bleed and had been on aspirin and Plavix.  I performed bilateral common iliac artery angioplasty and stenting and placed covered stents in May 2019.  Given that this was done 10 months ago, I think it would be reasonable to stop the Plavix and continue 81 mg of aspirin.  Alternatively, the patient could be on Plavix without aspirin if GI felt this was a better choice.  SUBJECTIVE:   No complaints.  She is anxious to go home.  PHYSICAL EXAM:   Vitals:   07/22/18 2142 07/23/18 0520 07/23/18 0650 07/23/18 1100  BP: 97/65 105/69 96/65   Pulse: 67 84 85 99  Resp: 16 16 16    Temp: 97.9 F (36.6 C) 98.1 F (36.7 C) 98.2 F (36.8 C)   TempSrc: Oral Oral Oral   SpO2: 98% 98% 99%   Weight:   56.7 kg   Height:       VAC on the right groin has a good seal.  LABS:   Lab Results  Component Value Date   WBC 18.0 (H) 07/23/2018   HGB 9.2 (L) 07/23/2018   HCT 27.8 (L) 07/23/2018   MCV 88.5 07/23/2018   PLT 342 07/23/2018   Lab Results  Component Value Date   CREATININE 7.58 (H) 07/22/2018   Lab Results  Component Value Date   INR 1.3 (H) 07/12/2018   CBG (last 3)  Recent Labs    07/23/18 0441 07/23/18 0834 07/23/18 1208  GLUCAP 147* 179* 155*    PROBLEM LIST:    Principal Problem:   GI bleed Active Problems:   ESRD (end stage renal disease) (HCC)   Diabetes mellitus type 1 ([place])   H/O pancreas transplant Jan2012 - Left mid abdomen   Mitral regurgitation   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HCC)   Hypovolemic shock (HCC)   Anemia associated with acute blood loss   Malnutrition of moderate degree   Open wound of right hip and thigh with complication   CURRENT MEDS:   . amitriptyline  10 mg Oral QHS   . aspirin  81 mg Oral Daily  . atorvastatin  10 mg Oral QHS  . clopidogrel  75 mg Oral Daily  . [START ON 07/24/2018] darbepoetin (ARANESP) injection - NON-DIALYSIS  100 mcg Subcutaneous Q Fri-1800  . feeding supplement (NEPRO CARB STEADY)  237 mL Oral TID BM  . gabapentin  100 mg Oral TID  . gentamicin cream  1 application Topical Daily  . insulin aspart  1-5 Units Subcutaneous Q4H  . insulin NPH Human  10 Units Subcutaneous QPM  . latanoprost  1 drop Both Eyes QHS  . lipase/protease/amylase  108,000 Units Oral TID WC  . lipase/protease/amylase  72,000 Units Oral With snacks  . multivitamin  1 tablet Oral QHS  . pantoprazole  40 mg Oral BID AC  . potassium chloride  20 mEq Oral Daily    [name] Beeper: [phone] Office: [phone] 07/23/2018

## 2018-07-24 DIAGNOSIS — Z91018 Allergy to other foods: Secondary | ICD-10-CM

## 2018-07-24 LAB — CBC
HEMATOCRIT: 27.2 % — AB (ref 36.0–46.0)
HEMOGLOBIN: 8.9 g/dL — AB (ref 12.0–15.0)
MCH: 29.1 pg (ref 26.0–34.0)
MCHC: 32.7 g/dL (ref 30.0–36.0)
MCV: 88.9 fL (ref 80.0–100.0)
Platelets: 335 10*3/uL (ref 150–400)
RBC: 3.06 MIL/uL — ABNORMAL LOW (ref 3.87–5.11)
RDW: 15.9 % — ABNORMAL HIGH (ref 11.5–15.5)
WBC: 15.1 10*3/uL — ABNORMAL HIGH (ref 4.0–10.5)
nRBC: 0.5 % — ABNORMAL HIGH (ref 0.0–0.2)

## 2018-07-24 LAB — GLUCOSE, CAPILLARY
Glucose-Capillary: 114 mg/dL — ABNORMAL HIGH (ref 70–99)
Glucose-Capillary: 181 mg/dL — ABNORMAL HIGH (ref 70–99)
Glucose-Capillary: 240 mg/dL — ABNORMAL HIGH (ref 70–99)
Glucose-Capillary: 252 mg/dL — ABNORMAL HIGH (ref 70–99)
Glucose-Capillary: 83 mg/dL (ref 70–99)

## 2018-07-24 MED ORDER — ASPIRIN 81 MG PO CHEW: 81.0000 mg | CHEWABLE_TABLET | Freq: Every day | ORAL | 0 refills | Status: DC

## 2018-07-24 MED ORDER — AMOXICILLIN-POT CLAVULANATE 500-125 MG PO TABS: 1.0000 | ORAL_TABLET | Freq: Every day | ORAL | 0 refills | Status: DC

## 2018-07-24 MED ORDER — DARBEPOETIN ALFA 100 MCG/0.5ML IJ SOSY
100.0000 ug | PREFILLED_SYRINGE | INTRAMUSCULAR | Status: DC
  Administered 2018-07-24: 100 ug via SUBCUTANEOUS
  Filled 2018-07-24: qty 0.5

## 2018-07-24 MED ORDER — HYDROMORPHONE HCL 2 MG PO TABS: 2.0000 mg | ORAL_TABLET | Freq: Four times a day (QID) | ORAL | 0 refills | Status: DC | PRN

## 2018-07-24 MED ORDER — AMOXICILLIN-POT CLAVULANATE 500-125 MG PO TABS
1.0000 | ORAL_TABLET | Freq: Every day | ORAL | Status: DC
  Administered 2018-07-24: 500 mg via ORAL
  Filled 2018-07-24: qty 1

## 2018-07-24 MED ORDER — INSULIN NPH (HUMAN) (ISOPHANE) 100 UNIT/ML ~~LOC~~ SUSP: 10.0000 [IU] | Freq: Every day | SUBCUTANEOUS | 11 refills | Status: AC

## 2018-07-24 MED ORDER — HYDROMORPHONE HCL 2 MG PO TABS: 1.0000 mg | ORAL_TABLET | Freq: Four times a day (QID) | ORAL | Status: DC | PRN

## 2018-07-24 MED ORDER — DARBEPOETIN ALFA 100 MCG/0.5ML IJ SOSY: 100.0000 ug | PREFILLED_SYRINGE | INTRAMUSCULAR | Status: DC

## 2018-07-24 MED ORDER — SODIUM CHLORIDE 0.9 % IV SOLN
250.0000 mg | Freq: Every day | INTRAVENOUS | Status: DC
  Administered 2018-07-24: 250 mg via INTRAVENOUS
  Filled 2018-07-24: qty 20

## 2018-07-24 NOTE — TOC Transition Note (Addendum)
Transition of Care [facility]) - CM/SW Discharge Note   Patient Details  Name: Priscilla [name] MRN: [id number] Date of Birth: Jan 22, 1971  Transition of Care [facility]) CM/SW Contact:  Bethena Roys, RN Phone Number: 07/24/2018, 1:43 PM   Clinical Narrative:  Plan for transition home once patient is approved for wound vac. VAC order signed and information submitted to [place] insurance authorization at this time. [facility] aware that patients plan is to transition home with [facility]. No further needs from this CM.   1557 30-27-20 Wound VAC approved-- patient is aware. [facility] working on Musician to Cabin crew to hospital. Staff RN aware. No further needs from CM at this time.   Final next level of care: [place] Barriers to Discharge: No Barriers Identified   Patient Goals and CMS Choice Patient states their goals for this hospitalization and ongoing recovery are:: "To get healthier overall" CMS Medicare.gov Compare Post Acute Care list provided to:: (N/A) Choice offered to / list presented to : NA  Discharge Placement                    Patient and family notified of of transfer: 07/17/18  Discharge Plan and Services In-house Referral: NA Discharge Planning Services: CM Consult Post Acute Care Choice: NA          DME Arranged: Negative pressure wound device DME Agency: [facility] HH Arranged: RN, PT [place] Agency: [facility]   Social Determinants of Health (SDOH) Interventions     Readmission Risk Interventions Readmission Risk Prevention Plan 07/17/2018  Transportation Screening Complete  PCP or Specialist Appt within 3-5 Days Not Complete  Not Complete comments Patient is being managed by Nephrology for outpatient appointment for ESRD  HRI or [place] Complete  Social Work Consult for [place] Planning/Counseling Not Complete  SW consult not completed comments Not appropriate; patient verbalized being  independent and declined any additional needs  Palliative Care Screening Not Applicable  Medication Review Press photographer) Complete  Some recent data might be hidden

## 2018-07-24 NOTE — Progress Notes (Addendum)
   VASCULAR SURGERY ASSESSMENT & PLAN:   POD 2: Debridement of right groin wound and placement of VAC:  From my standpoint the patient could be discharged once the [facility] is approved and I can follow the wound as an outpatient.  GI BLEED: The patient presented with a GI bleed and had been on aspirin and Plavix.  I performed bilateral common iliac artery angioplasty and stenting and placed covered stents in May 2019.  Given that this was done 10 months ago, I think it would be reasonable to stop the Plavix and continue 81 mg of aspirin.  Alternatively, the patient could be on Plavix without aspirin if GI felt this was a better choice.   SUBJECTIVE:   No complaints.  PHYSICAL EXAM:   Vitals:   07/23/18 1800 07/23/18 2020 07/24/18 0035 07/24/18 0447  BP: 94/67 106/74 102/62 107/68  Pulse: 100 91  83  Resp: 17 17  16   Temp: 98 F (36.7 C) 98.1 F (36.7 C)  98.1 F (36.7 C)  TempSrc: Oral Oral  Oral  SpO2: 95% 100% 100% 100%  Weight: 57.9 kg     Height:       Her VAC has a good seal. Wound measures 8cm L x 3cm W x 3cm D  LABS:   Lab Results  Component Value Date   WBC 15.1 (H) 07/24/2018   HGB 8.9 (L) 07/24/2018   HCT 27.2 (L) 07/24/2018   MCV 88.9 07/24/2018   PLT 335 07/24/2018   Lab Results  Component Value Date   CREATININE 7.58 (H) 07/22/2018   Lab Results  Component Value Date   INR 1.3 (H) 07/12/2018   CBG (last 3)  Recent Labs    07/23/18 2016 07/24/18 0023 07/24/18 0444  GLUCAP 148* 240* 252*    PROBLEM LIST:    Principal Problem:   GI bleed Active Problems:   ESRD (end stage renal disease) (HCC)   Diabetes mellitus type 1 ([place])   H/O pancreas transplant Jan2012 - Left mid abdomen   Mitral regurgitation   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HCC)   Hypovolemic shock (HCC)   Anemia associated with acute blood loss   Malnutrition of moderate degree   Open wound of right hip and thigh with complication   CURRENT MEDS:   . amitriptyline  10 mg  Oral QHS  . aspirin  81 mg Oral Daily  . atorvastatin  10 mg Oral QHS  . darbepoetin (ARANESP) injection - NON-DIALYSIS  100 mcg Subcutaneous Q Fri-1800  . feeding supplement (NEPRO CARB STEADY)  237 mL Oral TID BM  . gabapentin  100 mg Oral TID  . gentamicin cream  1 application Topical Daily  . insulin aspart  1-5 Units Subcutaneous Q4H  . insulin NPH Human  10 Units Subcutaneous QPM  . latanoprost  1 drop Both Eyes QHS  . lipase/protease/amylase  108,000 Units Oral TID WC  . lipase/protease/amylase  72,000 Units Oral With snacks  . multivitamin  1 tablet Oral QHS  . pantoprazole  40 mg Oral BID AC  . potassium chloride  20 mEq Oral Daily    [name] Beeper: [phone] Office: [phone] 07/24/2018

## 2018-07-24 NOTE — Discharge Instructions (Signed)
Gastrointestinal Bleeding Gastrointestinal (GI) bleeding is bleeding somewhere along the digestive tract, between the mouth and anus. This can be caused by various problems. The severity of these problems can range from mild to serious or even life-threatening. If you have GI bleeding, you may find blood in your stools (feces), you may have black stools, or you may vomit blood. If there is a lot of bleeding, you may need to stay in the hospital. What are the causes? This condition may be caused by:  Esophagitis. This is inflammation, irritation, or swelling of the esophagus.  Hemorrhoids.These are swollen veins in the rectum.  Anal fissures.These are areas of painful tearing that are often caused by passing hard stool.  Diverticulosis.These are pouches that form on the colon over time, with age, and may bleed a lot.  Diverticulitis.This is inflammation in areas with diverticulosis. It can cause pain, fever, and bloody stools, although bleeding may be mild.  Polyps and cancer. Colon cancer often starts out as precancerous polyps.  Gastritis and ulcers. With these, bleeding may come from the upper GI tract, near the stomach. What are the signs or symptoms? Symptoms of this condition may include:  Bright red blood in your vomit, or vomit that looks like coffee grounds.  Bloody, black, or tarry stools. ? Bleeding from the lower GI tract will usually cause red or maroon blood in the stools. ? Bleeding from the upper GI tract may cause black, tarry, often bad-smelling stools. ? In certain cases, if the bleeding is fast enough, the stools may be red.  Pain or cramping in the abdomen. How is this diagnosed? This condition may be diagnosed based on:  Medical history and physical exam.  Various tests, such as: ? Blood tests. ? X-rays and other imaging tests. ? Esophagogastroduodenoscopy (EGD). In this test, a flexible, lighted tube is used to look at your esophagus, stomach, and small  intestine. ? Colonoscopy. In this test, a flexible, lighted tube is used to look at your colon. How is this treated? Treatment for this condition depends on the cause of the bleeding. For example:  For bleeding from the esophagus, stomach, small intestine, or colon, the health care provider doing your EGD or colonoscopy may be able to stop the bleeding as part of the procedure.  Inflammation or infection of the colon can be treated with medicines.  Certain rectal problems can be treated with creams, suppositories, or warm baths.  Surgery is sometimes needed.  Blood transfusions are sometimes needed if a lot of blood has been lost. If bleeding is slow, you may be allowed to go home. If there is a lot of bleeding, you will need to stay in the hospital for observation. Follow these instructions at home:   Take over-the-counter and prescription medicines only as told by your health care provider.  Eat foods that are high in fiber. This will help to keep your stools soft. These foods include whole grains, legumes, fruits, and vegetables. Eating 1-3 prunes each day works well for many people.  Drink enough fluid to keep your urine clear or pale yellow.  Keep all follow-up visits as told by your health care provider. This is important. Contact a health care provider if:  Your symptoms Priscilla [name] not improve. Get help right away if:  Your bleeding increases.  You feel light-headed or you faint.  You feel weak.  You have severe cramps in your back or abdomen.  You pass large blood clots in your stool.  Your symptoms  are getting worse. This information is not intended to replace advice given to you by your health care provider. Make sure you discuss any questions you have with your health care provider. Document Released: 04/12/2000 Document Revised: 09/13/2015 Document Reviewed: 10/03/2014 Elsevier Interactive Patient Education  2019 Else[employer] Inc. [employer],   It has been  a pleasure working with you and we are glad you're feeling better. You were hospitalized for bleeding from a stomach ulcer. The gastroenterologist did a procedure where the placed clips in your stomach to stop the bleeding. After discussing with the vascular surgeons they were okay with stopping your plavix and just continuing your aspirin. Please start taking protonix twice a day to decrease the risk of bleeding again.   You were also noted to have in infection of your right surgical wound, the vascular surgeons did a debridement to remove most of the infection. Please continue to take augmentin for 4 more days. Follow up with vascular in 1-2 weeks. You can start taking dilaudid for pain control.   For your diabetes, you can decrease your insulin to 10 units nightly. Please continue to check your blood glucose at home and follow up with your PCP.   Your blood pressure was low while you were here while we were holding your blood pressure medications. Please stop taking the clonidine, losarten and carvedilol. Please follow up with your PCP to determine if you need to restart these.   Follow up with your primary care provider in 1-2 weeks  If your symptoms worsen or you develop new symptoms, please seek medical help whether it is your primary care provider or emergency department.  If you have any questions about this hospitalization please call [phone].

## 2018-07-24 NOTE — Progress Notes (Signed)
Inpatient Diabetes Program Recommendations  AACE/ADA: New Consensus Statement on Inpatient Glycemic Control (2015)  Target Ranges:  Prepandial:   less than 140 mg/dL      Peak postprandial:   less than 180 mg/dL (1-2 hours)      Critically ill patients:  140 - 180 mg/dL   Lab Results  Component Value Date   GLUCAP 181 (H) 07/24/2018   HGBA1C 11.0 (H) 06/30/2018    Review of Glycemic Control Results for Priscilla [name], Priscilla [name] (MRN [id number]) as of 07/24/2018 10:32  Ref. Range 07/23/2018 17:27 07/23/2018 20:16 07/24/2018 00:23 07/24/2018 04:44 07/24/2018 07:46  Glucose-Capillary Latest Ref Range: 70 - 99 mg/dL 176 (H) 148 (H) 240 (H) 252 (H) 181 (H)   Diabetes history: DM Outpatient Diabetes medications: Humalog 1-10 units TID, NPH 20 units QHS Current orders for Inpatient glycemic control: NPH 10 units q pm + Novolog correction 1-5 units q 4 hrs  Inpatient Diabetes Program Recommendations:   Spoke with patient via phone (DM coordinator not on campus) Patient has not received NPH insulin since 3-23 due to concern of going hypoglycemic. Patient agrees to check with her endocrinologist Dr. Chalmers Cater and attending MD to discuss options of insulin for discharge. Will need decreased amount of insulin on discharge unless patient's nutrition is different @ home and patient states she eats about the same @ home.  Thank you, Nani Gasser. Alexyia Guarino, RN, MSN, CDE  Diabetes Coordinator Inpatient Glycemic Control Team Team Pager [phone] (8am-5pm) 07/24/2018 10:37 AM

## 2018-07-24 NOTE — Progress Notes (Signed)
Physical Therapy Treatment Patient Details Name: Priscilla [name] MRN: [id number] DOB: [birth year] Today's Date: 07/24/2018    History of Present Illness Pt presents for EGD for GI bleed with PMH: end-stage renal disease on peritoneal dialysis, status post CABG, kidney and pancreatic transplant in 2012, mitral valve repair, ischemic cardiomyopathy 2/16, diabetes,recentrepair of right common femoral artery 2 femoral vein fistula with pain patch angioplasty on 07/07/2018.  Debridement of right groin with vac placement on 07/22/2018.     PT Comments    Pt admitted with above diagnosis. Pt currently with functional limitations due to balance and endurance deficits.  Pt was able to ambulate a short distance with RW with min assist. STates her son will help her at home and issued pt a gait belt.  Awaiting [employer] to come for left AFO - note in chart states they were called and consulted.  HHPT to continue progression.  Pt will benefit from skilled PT to increase their independence and safety with mobility to allow discharge to the venue listed below.     Follow Up Recommendations  Home health PT     Equipment Recommendations  Rolling walker with 5" wheels(gait belt issued)    Recommendations for Other Services       Precautions / Restrictions Precautions Precautions: Fall Precaution Comments: has L partial foot drop, was in the process of working with outpt PT and getting AFO Restrictions Weight Bearing Restrictions: No    Mobility  Bed Mobility Overal bed mobility: Needs Assistance Bed Mobility: Supine to Sit     Supine to sit: Supervision     General bed mobility comments: increased time and effort to get to EOB but no physical assist needed  Transfers Overall transfer level: Needs assistance Equipment used: Rolling walker (2 wheeled) Transfers: Sit to/from Stand Sit to Stand: Min assist;+2 safety/equipment         General transfer comment: Initially min guard  assist with standing with cues for hand placement.  Did require min A to steady, lost balance posteriorly with backing up to chair needing assist for stability.    Ambulation/Gait Ambulation/Gait assistance: Min guard;Min assist;+2 safety/equipment Gait Distance (Feet): 6 Feet Assistive device: Rolling walker (2 wheeled) Gait Pattern/deviations: Step-through pattern;Decreased stride length;Decreased dorsiflexion - left;Decreased weight shift to right;Decreased stance time - right   Gait velocity interpretation: <1.31 ft/sec, indicative of household ambulator General Gait Details: Pt ambulating well with RW going forward however did need min assist when backing up to chair and cues for hand placement.  Encouraged pt to have son guard her with gait belt. Issued pt a gait belt to take home   Stairs             Wheelchair Mobility    Modified Rankin (Stroke Patients Only)       Balance Overall balance assessment: Needs assistance Sitting-balance support: No upper extremity supported Sitting balance-Leahy Scale: Fair     Standing balance support: Bilateral upper extremity supported Standing balance-Leahy Scale: Poor Standing balance comment: reliant on UE support and external support at this point,                             Cognition Arousal/Alertness: Awake/alert Behavior During Therapy: WFL for tasks assessed/performed Overall Cognitive Status: Within Functional Limits for tasks assessed  Exercises General Exercises - Lower Extremity Ankle Circles/Pumps: AROM;Both;20 reps;Seated;Supine Long Arc Quad: AROM;Both;10 reps;Seated Straight Leg Raises: AROM;Both;10 reps;Supine    General Comments        Pertinent Vitals/Pain Pain Assessment: Faces Faces Pain Scale: Hurts little more Pain Location: infected wound R thigh Pain Descriptors / Indicators: Sore Pain Intervention(s): Limited activity within  patient's tolerance;Monitored during session;Repositioned    Home Living                      Prior Function            PT Goals (current goals can now be found in the care plan section) Acute Rehab PT Goals Patient Stated Goal: return home Progress towards PT goals: Progressing toward goals    Frequency    Min 3X/week      PT Plan Current plan remains appropriate    Co-evaluation              AM-PAC PT "6 Clicks" Mobility   Outcome Measure  Help needed turning from your back to your side while in a flat bed without using bedrails?: None Help needed moving from lying on your back to sitting on the side of a flat bed without using bedrails?: A Little Help needed moving to and from a bed to a chair (including a wheelchair)?: A Little Help needed standing up from a chair using your arms (e.g., wheelchair or bedside chair)?: A Lot Help needed to walk in hospital room?: A Lot Help needed climbing 3-5 steps with a railing? : Total 6 Click Score: 15    End of Session Equipment Utilized During Treatment: Gait belt Activity Tolerance: Patient limited by fatigue;Patient limited by pain Patient left: with call bell/phone within reach;in chair Nurse Communication: Mobility status PT Visit Diagnosis: Unsteadiness on feet (R26.81);Muscle weakness (generalized) (M62.81)     Time: 9924-2683 PT Time Calculation (min) (ACUTE ONLY): 15 min  Charges:  $Gait Training: 8-22 mins                     [place] Pager:  [phone]  Office:  [place] 07/24/2018, 11:39 AM

## 2018-07-24 NOTE — Progress Notes (Addendum)
Subjective: [name] reports that she is feeling okay today, no acute events overnight.  She reports that the pain medications that she got yesterday caused her to become very tired and she was not aware of what day it was. She reported that she was not having any pain this morning. She denied any hematemesis, abdominal pain, or other sites of bleeding. We discussed the plan for today and she is in agreement.   Objective:  Vital signs in last 24 hours: Vitals:   07/23/18 1800 07/23/18 2020 07/24/18 0035 07/24/18 0447  BP: 94/67 106/74 102/62 107/68  Pulse: 100 91  83  Resp: 17 17  16   Temp: 98 F (36.7 C) 98.1 F (36.7 C)  98.1 F (36.7 C)  TempSrc: Oral Oral  Oral  SpO2: 95% 100% 100% 100%  Weight: 57.9 kg     Height:        General: Well appearing, NAD Cardiac: RRR, no m/r/g Pulmonary: CTABl, no wheezing or rhonchi Abdomen: Soft, non-tender, non-distendedd Extremity: No LE edema, right wound vac in place, minimal serosanguinous fluid  Assessment/Plan:  Principal Problem:   GI bleed Active Problems:   ESRD (end stage renal disease) (HCC)   Diabetes mellitus type 1 (HCC)   H/O pancreas transplant Jan2012 - Left mid abdomen   Mitral regurgitation   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HCC)   Hypovolemic shock (HCC)   Anemia associated with acute blood loss   Malnutrition of moderate degree   Open wound of right hip and thigh with complication  Upper GI bleed 2/2 gastric ulcer: Patient presented with UGIB that was found to be 2/2 bleeding gastric ulcer while on DAPT.She received epineprhine injection, cautery, and endo clip placement. Transferred out of ICU on 3/20. She had been restarted on her ASA and plavix but then had a recurrence of hematemesisthe night on 3/20.GI took her for endoscopythatshowed the non-bleeding gastric ulcer with previously placed 2 endoclips with no active bleeding, with localized severely erythematous mucosa with bleeding in the gastric  body, injected with epinephrine for hemostasis and coagulation with bipolar probe.  We restarted her aspirin yesterday, she has no recurrence of her hematemesis and her hemoglobin has been stable.  Vital signs have been stable.   -GIfollowing, appreciate recommendations -Continue ASA, monitor for bleeding -CBCdaily -Continue protonixBID -S/p erythromycin IV x1  Right groinAVF infection:S/p bilateral iliac stents on 09/19/2017, found to have a right groin AVF and underwent repair on 07/07/18. She has some purulent, foul smelling drainage out of her wound, no erythema or edema around the area.  Patient was taken for debridement on 3/25.  She currently has had wound VAC in place draining serosanguineous fluid site does not look infected.  Patient has been afebrile and leukocytosis has been improving.  She is currently on cefazolin.  -Discontinue cefazolin -Start Augmentin, will continue for 4 more days on discharge to complete a 7-day course from the day of debridement -Wet to dry dressings -Vascular following, appreciate recommendations  Pancreatic insufficiency: -Patient has a history of pancreas transplant in Jan 2012, she is on Creon at home for this. Patient denies any issues with diarrhea. -Continue Creon  ESRD on PD: -Nephrology following, appreciate recommendations -Continue PD overnight  PVD: Patient has had bilateral common iliac artery angioplasty and stenting, she is on ASA and plavix for this.  Per vascular surgery, it is reasonable to stop the Plavix and continue her aspirin. -Continue ASA, monitoring for bleeds -Discontinue Plavix on discharge  Ischemic cardiomyopathy Hx  severe MR s/p mitral valve repair Hx PFO s/p closure HTN:  S/p CABG x2. She is on coreg, lorartan, and clonidine at home. These were held due to her UGIB causing hypotension requiring pressors.  Blood pressure continues to be soft, will hold these medications on discharge and follow-up with PCP to  determine need to restart these.  DM type 1: -She is on NPH 20 units QHS and humalog 1-10 units TID AC at home.  -Continue NPH 10 units and custom SSI -Frequent CBGs  OYW:VXUCJARW,PTYYPEJ lytes prn, NPO VTE YLT:EIHD Code Status: FULL  Dispo: Anticipated discharge in approximately today.   Asencion Noble, MD 07/24/2018, 7:20 AM Pager: [phone]

## 2018-07-24 NOTE — Progress Notes (Addendum)
[facility] Progress Note   Dialysis Orders: PD [facility] EDW 54 kg 6 total exchanges- 5 on the cycler 2 L fill vol 1 hr 30 min dwell time and 1 pause of 1500 mL, 4 hrs, dry day.  Assessment/Plan: 1. Upper GIB - EGD x2due to clotted blood in stomach. On 2nd egd 3/16had Rx of gastric ulcers w/ stigmata of bleeding x2.New hematemesis3/20. Seen by GI,asa/ plavixheld and resumed IV PPI- repeat EGD 3/22 - had epi and bicap of oozing gastric ulcer. 2. Anemia abl/ ckd - sp 2up prbc's on 3/15.Hgb 6.7 > 8.8 > 9.2> 8.9   after 1 unit PRBC3/24.Aranesp 100q Friday. Low tsat12%onIV Fe load.- for redose ESA today and increase IV Fe dose to 250 today 3. Hypotension - .BP'smarginalBP meds on hold.used half 1.5 and 2.5 last night  4. Volume- net UF only 600s x 2 days this am - post wt 57.9 doesn't get to edw as outpatient 5. ESRD on CCPD - cont PD nightly - repeat BMP in am  If still here  cotinue half 1.5 and 2.5 unless BP up this pm 6. CAD hx CABG/ MV repair 7. DM2 on insulin 8. Hypomagnesemia - very low1.1, got 6gm IV Mg 3/19-20, level better1.8 3/22 9. Hyperkalemia -on  20 mEq KCL/ day po. 10. Right groin wound/PVD - infection - post AVF repair 3/10 plavix/asa on hold- + Kleb pneumo- changed to Ancef-  s/p debridement of fat necrosis and placement of VAC 3/25 WBC improved -  11. Hx pancreas transplant 2012 - on creon for insufficiency 13. Hx mitral valve repar/PFO closure - CABG x 2 - 14. Nutrition alb 1.8 - encouraged protein /discussed strategies 15. Disp - possible d/c today if everything can be arranged - family can help with PD- would d/c off BP meds and can resume as needed  [name], PA-C [place] [phone] 07/24/2018,8:08 AM  LOS: 12 days   Subjective:  Has slight L foot drop - to get brace - has been OOB with PT only  Objective Vitals:   07/23/18 1800 07/23/18 2020 07/24/18 0035 07/24/18 0447  BP: 94/67 106/74  102/62 107/68  Pulse: 100 91  83  Resp: 17 17  16   Temp: 98 F (36.7 C) 98.1 F (36.7 C)  98.1 F (36.7 C)  TempSrc: Oral Oral  Oral  SpO2: 95% 100% 100% 100%  Weight: 57.9 kg     Height:       Physical Exam General: NAD Heart: RRR Lungs: dry crackles left base Abdomen:soft VAC right groin, min dependent edema Extremities: no edema Dialysis Access:  PD cath   Additional Objective Labs: Basic Metabolic Panel: Recent Labs  Lab 07/18/18 0014  07/20/18 0618 07/22/18 0347 07/22/18 0949  NA 135   < > 135 133* 135  K 5.5*   < > 3.7 3.6 3.6  CL 99   < > 100 96* 97*  CO2 20*   < > 22 25 26   GLUCOSE 217*   < > 174* 169* 129*  BUN 28*   < > 25* 25* 23*  CREATININE 8.58*   < > 8.01* 7.84* 7.58*  CALCIUM 8.1*   < > 7.7* 8.1* 8.0*  PHOS 2.8  --   --  3.7  --    < > = values in this interval not displayed.   Liver Function Tests: Recent Labs  Lab 07/18/18 0014 07/22/18 0347  ALBUMIN 1.9* 1.8*   No results for input(s): LIPASE,  AMYLASE in the last 168 hours. CBC: Recent Labs  Lab 07/17/18 2339 07/18/18 0321  07/21/18 0334 07/21/18 1614 07/22/18 0347 07/23/18 0302 07/24/18 0303  WBC 24.8* 23.3*   < > 23.0* 27.1* 20.6* 18.0* 15.1*  NEUTROABS 21.1* 19.0*  --   --   --   --   --   --   HGB 7.5* 7.0*   < > 6.7* 9.1* 8.8* 9.2* 8.9*  HCT 22.4* 21.8*   < > 20.6* 27.7* 27.5* 27.8* 27.2*  MCV 87.8 87.2   < > 89.2 87.7 88.1 88.5 88.9  PLT 269 259   < > 340 336 335 342 335   < > = values in this interval not displayed.   Blood Culture    Component Value Date/Time   SDES ABSCESS 07/19/2018 1127   SPECREQUEST Normal 07/19/2018 1127   CULT ABUNDANT KLEBSIELLA PNEUMONIAE 07/19/2018 1127   REPTSTATUS 07/21/2018 FINAL 07/19/2018 1127    CBG: Recent Labs  Lab 07/23/18 1727 07/23/18 2016 07/24/18 0023 07/24/18 0444 07/24/18 0746  GLUCAP 176* 148* 240* 252* 181*    Lab Results  Component Value Date   INR 1.3 (H) 07/12/2018   INR 1.2 07/07/2018   INR 1.1  06/30/2018   Studies/Results: No results found. Medications: . sodium chloride 10 mL (07/21/18 1013)  . sodium chloride 10 mL/hr at 07/22/18 1118  .  ceFAZolin (ANCEF) IV 500 mg (07/23/18 2120)  . dialysis solution 2.5% low-MG/low-CA    . erythromycin 250 mL/hr at 07/18/18 1222  . ferric gluconate (FERRLECIT/NULECIT) IV 125 mg (07/22/18 1504)   . amitriptyline  10 mg Oral QHS  . aspirin  81 mg Oral Daily  . atorvastatin  10 mg Oral QHS  . darbepoetin (ARANESP) injection - NON-DIALYSIS  100 mcg Subcutaneous Q Fri-1800  . feeding supplement (NEPRO CARB STEADY)  237 mL Oral TID BM  . gabapentin  100 mg Oral TID  . gentamicin cream  1 application Topical Daily  . insulin aspart  1-5 Units Subcutaneous Q4H  . insulin NPH Human  10 Units Subcutaneous QPM  . latanoprost  1 drop Both Eyes QHS  . lipase/protease/amylase  108,000 Units Oral TID WC  . lipase/protease/amylase  72,000 Units Oral With snacks  . multivitamin  1 tablet Oral QHS  . pantoprazole  40 mg Oral BID AC  . potassium chloride  20 mEq Oral Daily     I have seen and examined this patient and agree with plan and assessment in the above note with renal recommendations/intervention highlighted.  Stable for discharge to home. [place] John A Tryton Bodi,MD 07/24/2018 10:42 AM

## 2018-07-26 ENCOUNTER — Inpatient Hospital Stay (HOSPITAL_COMMUNITY)
Admission: EM | Admit: 2018-07-26 | Discharge: 2018-07-28 | DRG: 377 | Disposition: A | Discharge status: Home Health | Payer: Medicare Other | Attending: Internal Medicine | Admitting: Internal Medicine

## 2018-07-26 ENCOUNTER — Encounter (HOSPITAL_COMMUNITY): Payer: Self-pay

## 2018-07-26 ENCOUNTER — Other Ambulatory Visit: Payer: Self-pay

## 2018-07-26 ENCOUNTER — Emergency Department (HOSPITAL_COMMUNITY): Payer: Medicare Other

## 2018-07-26 DIAGNOSIS — I252 Old myocardial infarction: Secondary | ICD-10-CM

## 2018-07-26 DIAGNOSIS — K254 Chronic or unspecified gastric ulcer with hemorrhage: Secondary | ICD-10-CM | POA: Diagnosis not present

## 2018-07-26 DIAGNOSIS — S71001D Unspecified open wound, right hip, subsequent encounter: Secondary | ICD-10-CM

## 2018-07-26 DIAGNOSIS — Z992 Dependence on renal dialysis: Secondary | ICD-10-CM

## 2018-07-26 DIAGNOSIS — E1022 Type 1 diabetes mellitus with diabetic chronic kidney disease: Secondary | ICD-10-CM | POA: Diagnosis present

## 2018-07-26 DIAGNOSIS — Z951 Presence of aortocoronary bypass graft: Secondary | ICD-10-CM

## 2018-07-26 DIAGNOSIS — J9811 Atelectasis: Secondary | ICD-10-CM | POA: Diagnosis present

## 2018-07-26 DIAGNOSIS — Z794 Long term (current) use of insulin: Secondary | ICD-10-CM

## 2018-07-26 DIAGNOSIS — N186 End stage renal disease: Secondary | ICD-10-CM

## 2018-07-26 DIAGNOSIS — N2581 Secondary hyperparathyroidism of renal origin: Secondary | ICD-10-CM | POA: Diagnosis present

## 2018-07-26 DIAGNOSIS — Z8249 Family history of ischemic heart disease and other diseases of the circulatory system: Secondary | ICD-10-CM

## 2018-07-26 DIAGNOSIS — Y832 Surgical operation with anastomosis, bypass or graft as the cause of abnormal reaction of the patient, or of later complication, without mention of misadventure at the time of the procedure: Secondary | ICD-10-CM | POA: Diagnosis present

## 2018-07-26 DIAGNOSIS — K8689 Other specified diseases of pancreas: Secondary | ICD-10-CM | POA: Diagnosis present

## 2018-07-26 DIAGNOSIS — I2510 Atherosclerotic heart disease of native coronary artery without angina pectoris: Secondary | ICD-10-CM | POA: Diagnosis present

## 2018-07-26 DIAGNOSIS — M21372 Foot drop, left foot: Secondary | ICD-10-CM | POA: Diagnosis present

## 2018-07-26 DIAGNOSIS — Z79899 Other long term (current) drug therapy: Secondary | ICD-10-CM

## 2018-07-26 DIAGNOSIS — R531 Weakness: Secondary | ICD-10-CM

## 2018-07-26 DIAGNOSIS — S71101D Unspecified open wound, right thigh, subsequent encounter: Secondary | ICD-10-CM

## 2018-07-26 DIAGNOSIS — G8929 Other chronic pain: Secondary | ICD-10-CM | POA: Diagnosis present

## 2018-07-26 DIAGNOSIS — I959 Hypotension, unspecified: Secondary | ICD-10-CM | POA: Diagnosis not present

## 2018-07-26 DIAGNOSIS — I255 Ischemic cardiomyopathy: Secondary | ICD-10-CM | POA: Diagnosis present

## 2018-07-26 DIAGNOSIS — E785 Hyperlipidemia, unspecified: Secondary | ICD-10-CM | POA: Diagnosis present

## 2018-07-26 DIAGNOSIS — I739 Peripheral vascular disease, unspecified: Secondary | ICD-10-CM

## 2018-07-26 DIAGNOSIS — Z823 Family history of stroke: Secondary | ICD-10-CM

## 2018-07-26 DIAGNOSIS — Z7982 Long term (current) use of aspirin: Secondary | ICD-10-CM

## 2018-07-26 DIAGNOSIS — T827XXD Infection and inflammatory reaction due to other cardiac and vascular devices, implants and grafts, subsequent encounter: Secondary | ICD-10-CM

## 2018-07-26 DIAGNOSIS — Q211 Atrial septal defect: Secondary | ICD-10-CM

## 2018-07-26 DIAGNOSIS — Z825 Family history of asthma and other chronic lower respiratory diseases: Secondary | ICD-10-CM

## 2018-07-26 DIAGNOSIS — I509 Heart failure, unspecified: Secondary | ICD-10-CM | POA: Diagnosis present

## 2018-07-26 DIAGNOSIS — K625 Hemorrhage of anus and rectum: Secondary | ICD-10-CM

## 2018-07-26 DIAGNOSIS — G4700 Insomnia, unspecified: Secondary | ICD-10-CM | POA: Diagnosis present

## 2018-07-26 DIAGNOSIS — I132 Hypertensive heart and chronic kidney disease with heart failure and with stage 5 chronic kidney disease, or end stage renal disease: Secondary | ICD-10-CM | POA: Diagnosis present

## 2018-07-26 DIAGNOSIS — E1051 Type 1 diabetes mellitus with diabetic peripheral angiopathy without gangrene: Secondary | ICD-10-CM | POA: Diagnosis present

## 2018-07-26 DIAGNOSIS — T8612 Kidney transplant failure: Secondary | ICD-10-CM | POA: Diagnosis present

## 2018-07-26 DIAGNOSIS — E782 Mixed hyperlipidemia: Secondary | ICD-10-CM | POA: Diagnosis present

## 2018-07-26 DIAGNOSIS — J90 Pleural effusion, not elsewhere classified: Secondary | ICD-10-CM | POA: Diagnosis present

## 2018-07-26 DIAGNOSIS — T86891 Other transplanted tissue failure: Secondary | ICD-10-CM | POA: Diagnosis present

## 2018-07-26 DIAGNOSIS — K210 Gastro-esophageal reflux disease with esophagitis: Secondary | ICD-10-CM | POA: Diagnosis present

## 2018-07-26 DIAGNOSIS — Z940 Kidney transplant status: Secondary | ICD-10-CM

## 2018-07-26 DIAGNOSIS — Y830 Surgical operation with transplant of whole organ as the cause of abnormal reaction of the patient, or of later complication, without mention of misadventure at the time of the procedure: Secondary | ICD-10-CM | POA: Diagnosis present

## 2018-07-26 LAB — I-STAT BETA HCG BLOOD, ED (MC, WL, AP ONLY): I-stat hCG, quantitative: <5 m[IU]/mL (ref <5)

## 2018-07-26 LAB — CBC WITH DIFFERENTIAL/PLATELET
ABS IMMATURE GRANULOCYTES: 0.34 10*3/uL — AB (ref 0.00–0.07)
Basophils Absolute: 0.1 10*3/uL (ref 0.0–0.1)
Basophils Relative: 1 %
Eosinophils Absolute: 0.5 10*3/uL (ref 0.0–0.5)
Eosinophils Relative: 3 %
HCT: 28.9 % — ABNORMAL LOW (ref 36.0–46.0)
HEMOGLOBIN: 9 g/dL — AB (ref 12.0–15.0)
Immature Granulocytes: 2 %
Lymphocytes Relative: 9 %
Lymphs Abs: 1.4 10*3/uL (ref 0.7–4.0)
MCH: 28.4 pg (ref 26.0–34.0)
MCHC: 31.1 g/dL (ref 30.0–36.0)
MCV: 91.2 fL (ref 80.0–100.0)
Monocytes Absolute: 1.6 10*3/uL — ABNORMAL HIGH (ref 0.1–1.0)
Monocytes Relative: 10 %
NEUTROS ABS: 11.5 10*3/uL — AB (ref 1.7–7.7)
Neutrophils Relative %: 75 %
Platelets: 327 10*3/uL (ref 150–400)
RBC: 3.17 MIL/uL — ABNORMAL LOW (ref 3.87–5.11)
RDW: 16.5 % — ABNORMAL HIGH (ref 11.5–15.5)
WBC: 15.5 10*3/uL — ABNORMAL HIGH (ref 4.0–10.5)
nRBC: 0.4 % — ABNORMAL HIGH (ref 0.0–0.2)

## 2018-07-26 LAB — COMPREHENSIVE METABOLIC PANEL
ALT: <5 U/L (ref 0–44)
AST: 44 U/L — ABNORMAL HIGH (ref 15–41)
Albumin: 1.8 g/dL — ABNORMAL LOW (ref 3.5–5.0)
Alkaline Phosphatase: 305 U/L — ABNORMAL HIGH (ref 38–126)
Anion gap: 15 (ref 5–15)
BILIRUBIN TOTAL: 0.8 mg/dL (ref 0.3–1.2)
BUN: 22 mg/dL — ABNORMAL HIGH (ref 6–20)
CO2: 23 mmol/L (ref 22–32)
CREATININE: 8.32 mg/dL — AB (ref 0.44–1.00)
Calcium: 8.2 mg/dL — ABNORMAL LOW (ref 8.9–10.3)
Chloride: 96 mmol/L — ABNORMAL LOW (ref 98–111)
GFR calc Af Amer: 6 mL/min — ABNORMAL LOW (ref >60)
GFR, EST NON AFRICAN AMERICAN: 5 mL/min — AB (ref >60)
Glucose, Bld: 183 mg/dL — ABNORMAL HIGH (ref 70–99)
Potassium: 4.6 mmol/L (ref 3.5–5.1)
Sodium: 134 mmol/L — ABNORMAL LOW (ref 135–145)
Total Protein: 6.2 g/dL — ABNORMAL LOW (ref 6.5–8.1)

## 2018-07-26 LAB — PROTIME-INR
INR: 1.6 — ABNORMAL HIGH (ref 0.8–1.2)
Prothrombin Time: 18.9 seconds — ABNORMAL HIGH (ref 11.4–15.2)

## 2018-07-26 LAB — GLUCOSE, CAPILLARY
Glucose-Capillary: 146 mg/dL — ABNORMAL HIGH (ref 70–99)
Glucose-Capillary: 138 mg/dL — ABNORMAL HIGH (ref 70–99)

## 2018-07-26 MED ORDER — AMITRIPTYLINE HCL 10 MG PO TABS
10.0000 mg | ORAL_TABLET | Freq: Every day | ORAL | Status: DC
  Administered 2018-07-26 – 2018-07-27 (×2): 10 mg via ORAL
  Filled 2018-07-26 (×3): qty 1

## 2018-07-26 MED ORDER — SODIUM CHLORIDE 0.9 % IV SOLN
80.0000 mg | Freq: Once | INTRAVENOUS | Status: AC
  Administered 2018-07-26: 80 mg via INTRAVENOUS
  Filled 2018-07-26: qty 80

## 2018-07-26 MED ORDER — RENA-VITE PO TABS
1.0000 | ORAL_TABLET | Freq: Every day | ORAL | Status: DC
  Administered 2018-07-26 – 2018-07-27 (×2): 1 via ORAL
  Filled 2018-07-26 (×2): qty 1

## 2018-07-26 MED ORDER — INSULIN NPH (HUMAN) (ISOPHANE) 100 UNIT/ML ~~LOC~~ SUSP
10.0000 [IU] | Freq: Every day | SUBCUTANEOUS | Status: DC
  Administered 2018-07-27: 10 [IU] via SUBCUTANEOUS
  Filled 2018-07-26: qty 10

## 2018-07-26 MED ORDER — CALCITRIOL 0.5 MCG PO CAPS
0.5000 ug | ORAL_CAPSULE | Freq: Every day | ORAL | Status: DC
  Administered 2018-07-26 – 2018-07-27 (×2): 0.5 ug via ORAL
  Filled 2018-07-26 (×2): qty 1

## 2018-07-26 MED ORDER — LOPERAMIDE HCL 2 MG PO CAPS: 2.0000 mg | ORAL_CAPSULE | Freq: Three times a day (TID) | ORAL | Status: DC | PRN

## 2018-07-26 MED ORDER — SODIUM CHLORIDE 0.9 % IV SOLN
80.0000 mg | Freq: Two times a day (BID) | INTRAVENOUS | Status: DC
  Administered 2018-07-27: 80 mg via INTRAVENOUS
  Filled 2018-07-26: qty 80

## 2018-07-26 MED ORDER — GENTAMICIN SULFATE 0.1 % EX CREA
1.0000 "application " | TOPICAL_CREAM | Freq: Every day | CUTANEOUS | Status: DC
  Administered 2018-07-27: 1 via TOPICAL
  Filled 2018-07-26: qty 15

## 2018-07-26 MED ORDER — HYDROMORPHONE HCL 2 MG PO TABS
2.0000 mg | ORAL_TABLET | Freq: Four times a day (QID) | ORAL | Status: DC | PRN
  Administered 2018-07-26: 2 mg via ORAL
  Filled 2018-07-26: qty 1

## 2018-07-26 MED ORDER — AMOXICILLIN-POT CLAVULANATE 500-125 MG PO TABS
1.0000 | ORAL_TABLET | Freq: Every day | ORAL | Status: DC
  Administered 2018-07-26 – 2018-07-27 (×2): 500 mg via ORAL
  Filled 2018-07-26 (×2): qty 1

## 2018-07-26 MED ORDER — LIDOCAINE HCL (PF) 1 % IJ SOLN
5.0000 mL | Freq: Once | INTRAMUSCULAR | Status: DC
  Filled 2018-07-26: qty 5

## 2018-07-26 MED ORDER — GABAPENTIN 100 MG PO CAPS
100.0000 mg | ORAL_CAPSULE | Freq: Three times a day (TID) | ORAL | Status: DC
  Administered 2018-07-26 – 2018-07-28 (×6): 100 mg via ORAL
  Filled 2018-07-26 (×6): qty 1

## 2018-07-26 MED ORDER — INSULIN ASPART 100 UNIT/ML ~~LOC~~ SOLN
0.0000 [IU] | Freq: Three times a day (TID) | SUBCUTANEOUS | Status: DC
  Administered 2018-07-26: 1 [IU] via SUBCUTANEOUS
  Administered 2018-07-27: 2 [IU] via SUBCUTANEOUS
  Administered 2018-07-27: 3 [IU] via SUBCUTANEOUS
  Administered 2018-07-27: 1 [IU] via SUBCUTANEOUS
  Administered 2018-07-28: 3 [IU] via SUBCUTANEOUS

## 2018-07-26 MED ORDER — ATORVASTATIN CALCIUM 10 MG PO TABS
10.0000 mg | ORAL_TABLET | Freq: Every day | ORAL | Status: DC
  Administered 2018-07-26 – 2018-07-27 (×2): 10 mg via ORAL
  Filled 2018-07-26 (×2): qty 1

## 2018-07-26 MED ORDER — POTASSIUM CHLORIDE CRYS ER 20 MEQ PO TBCR
20.0000 meq | EXTENDED_RELEASE_TABLET | Freq: Every day | ORAL | Status: DC
  Administered 2018-07-26: 20 meq via ORAL
  Filled 2018-07-26: qty 1

## 2018-07-26 MED ORDER — PANCRELIPASE (LIP-PROT-AMYL) 12000-38000 UNITS PO CPEP
108000.0000 [IU] | ORAL_CAPSULE | Freq: Three times a day (TID) | ORAL | Status: DC
  Administered 2018-07-26 – 2018-07-28 (×4): 108000 [IU] via ORAL
  Filled 2018-07-26 (×5): qty 3
  Filled 2018-07-26: qty 9
  Filled 2018-07-26: qty 3

## 2018-07-26 MED ORDER — CALCIUM ACETATE (PHOS BINDER) 667 MG PO CAPS
2001.0000 mg | ORAL_CAPSULE | Freq: Three times a day (TID) | ORAL | Status: DC
  Administered 2018-07-27 – 2018-07-28 (×3): 2001 mg via ORAL
  Filled 2018-07-26 (×4): qty 3

## 2018-07-26 NOTE — Consult Note (Signed)
[facility]    NEPHROLOGY CONSULTATION NOTE  PATIENT ID:  Priscilla [name], DOB:  08/10/1970  HPI: The patient is a 48 y.o. year old female with a past medical history significant for end-stage renal disease status post renal transplant x2 on peritoneal dialysis, type I diabetes mellitus, pancreatic insufficiency status post failed pancreas transplant, peripheral vascular disease status post bilateral iliac stents, ischemic cardiomyopathy and mitral regurgitation status post valve repair, CABG x2, and hypertension who presented to the emergency department with hematochezia.  She was discharged 2 days ago for an upper GI bleed that was found to be secondary to a bleeding gastric ulcer while on dual antiplatelet therapy with aspirin and Plavix.  It was treated with epinephrine injection, cautery, and Endo Clip placement, and her Plavix was discontinued.  She was also found to have an infection of her right groin AV fistula status post bilateral iliac stents.  She underwent surgical repair and wound VAC was placed.  She continues to take the Augmentin as prescribed.  She is felt well since her discharge from the hospital, but has had a couple episodes of bowel incontinence.  This morning, she was noted to have a bright red bowel movement.  She complained of lightheadedness with standing.  With regard to her peritoneal dialysis, she reports that her dialysis is going well and she is having no issues.  Her last dialysis was overnight last night via her cycler.  Renal consultation has been called for end-stage renal disease and peritoneal dialysis management.   Past Medical History:  Diagnosis Date  . Anemia   . CAD (coronary artery disease)   . Cardiomyopathy, ischemic 06/24/2014  . Diabetes mellitus without complication ([place])   . End stage renal disease ([place])   . Foot drop, left foot   . GERD (gastroesophageal reflux disease)   . H/O kidney transplant   . Hyperlipidemia   .  Hypertension   . Myocardial infarction ([place])   . Pancreas transplanted ([place])   . Renal disorder   . 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 S/P mitral valve repair 07/18/14    Past Surgical History:  Procedure Laterality Date  . ABDOMINAL AORTOGRAM W/LOWER EXTREMITY N/A 09/19/2017   Procedure: ABDOMINAL AORTOGRAM W/LOWER EXTREMITY;  Surgeon: Angelia Mould, MD;  Location: [place] [facility];  Service: Cardiovascular;  Laterality: N/A;  . CAPD INSERTION N/A 09/08/2013   Procedure: Laparoscopic CAPD peritoneal dialysis catheter placement, possible lysis of adhesions   ;  Surgeon: Adin Hector, MD;  Location: [place];  Service: General;  Laterality: N/A;  . CARDIAC CATHETERIZATION     06/2014  . CESAREAN SECTION    . COMBINED KIDNEY-PANCREAS TRANSPLANT  05/2010   [facility]..  Pancreas left mid abdomen  . CORONARY ARTERY BYPASS GRAFT N/A 07/18/2014   Procedure: CORONARY ARTERY BYPASS GRAFTING (CABG)X2 LIMA-LAD; SVG-PD;  Surgeon: Melrose Nakayama, MD;  Location: [place];  Service: Open Heart Surgery;  Laterality: N/A;  . ESOPHAGOGASTRODUODENOSCOPY (EGD) WITH PROPOFOL N/A 07/12/2018   Procedure: ESOPHAGOGASTRODUODENOSCOPY (EGD) WITH PROPOFOL;  Surgeon: Clarene Essex, MD;  Location: [place];  Service: Endoscopy;  Laterality: N/A;  . ESOPHAGOGASTRODUODENOSCOPY (EGD) WITH PROPOFOL N/A 07/13/2018   Procedure: ESOPHAGOGASTRODUODENOSCOPY (EGD) WITH PROPOFOL;  Surgeon: Ronnette Juniper, MD;  Location: [place];  Service: Gastroenterology;  Laterality: N/A;  . ESOPHAGOGASTRODUODENOSCOPY (EGD) WITH PROPOFOL N/A 07/19/2018   Procedure: ESOPHAGOGASTRODUODENOSCOPY (EGD) WITH PROPOFOL;  Surgeon: Ronnette Juniper, MD;  Location: [place];  Service: Gastroenterology;  Laterality: N/A;  .  FEMORAL ARTERY EXPLORATION Right 07/07/2018   Procedure: REPAIR RIGHT COMMON FEMORAL ARTERY TO FEMORAL VEIN FISTULA  WITH VEIN PATCH ANGIOPLASTY;  Surgeon: Angelia Mould, MD;  Location: [place];  Service: Vascular;   Laterality: Right;  . GROIN DEBRIDEMENT Right 07/22/2018   Procedure: GROIN DEBRIDEMENT , POSSIBLE VAC;  Surgeon: Rosetta Posner, MD;  Location: [place];  Service: Vascular;  Laterality: Right;  . HEMOSTASIS CLIP PLACEMENT  07/13/2018   Procedure: HEMOSTASIS CLIP PLACEMENT;  Surgeon: Ronnette Juniper, MD;  Location: [place];  Service: Gastroenterology;;  . HOT HEMOSTASIS N/A 07/13/2018   Procedure: HOT HEMOSTASIS (ARGON PLASMA COAGULATION/BICAP);  Surgeon: Ronnette Juniper, MD;  Location: [place];  Service: Gastroenterology;  Laterality: N/A;  . HOT HEMOSTASIS N/A 07/19/2018   Procedure: HOT HEMOSTASIS (ARGON PLASMA COAGULATION/BICAP);  Surgeon: Ronnette Juniper, MD;  Location: [place];  Service: Gastroenterology;  Laterality: N/A;  . INSERTION OF DIALYSIS CATHETER  September 28, 2012   Right upper chest  . LAPAROSCOPIC INSERTION PERITONEAL CATHETER  07/18/2008   Dr Excell Seltzer  . LAPAROSCOPIC REPOSITIONING CAPD CATHETER  11-03-202010   Dr Excell Seltzer  . LAPAROSCOPIC REPOSITIONING CAPD CATHETER  11/08/2008   Dr Excell Seltzer  . LAPAROSCOPIC REPOSITIONING CAPD CATHETER  10/03/2009   Unplugging CAPD catheter Dr Excell Seltzer  . LEFT HEART CATHETERIZATION WITH CORONARY ANGIOGRAM N/A 06/28/2014   Procedure: LEFT HEART CATHETERIZATION WITH CORONARY ANGIOGRAM;  Surgeon: [name], MD;  Location: [place] [facility];  Service: Cardiovascular;  Laterality: N/A;  . MITRAL VALVE REPAIR N/A 07/18/2014   Procedure: MITRAL VALVE REPAIR (MVR);  Surgeon: Melrose Nakayama, MD;  Location: [place];  Service: Open Heart Surgery;  Laterality: N/A;  . OMENTECTOMY  11/08/2008   Dr Excell Seltzer  . PERIPHERAL VASCULAR INTERVENTION Bilateral 09/19/2017   Procedure: PERIPHERAL VASCULAR INTERVENTION;  Surgeon: Angelia Mould, MD;  Location: [place] [facility];  Service: Cardiovascular;  Laterality: Bilateral;  Iliac stents  . SCLEROTHERAPY  07/13/2018   Procedure: Clide Deutscher;  Surgeon: Ronnette Juniper, MD;  Location: [facility];  Service:  Gastroenterology;;  . SCLEROTHERAPY  07/19/2018   Procedure: Clide Deutscher;  Surgeon: Ronnette Juniper, MD;  Location: [place];  Service: Gastroenterology;;  . TEE WITHOUT CARDIOVERSION N/A 07/18/2014   Procedure: TRANSESOPHAGEAL ECHOCARDIOGRAM (TEE);  Surgeon: Melrose Nakayama, MD;  Location: [place];  Service: Open Heart Surgery;  Laterality: N/A;    Family History  Problem Relation Age of Onset  . CAD Mother   . Stroke Mother   . COPD Father   . Colon cancer Neg Hx     Social History   Tobacco Use  . Smoking status: Never Smoker  . Smokeless tobacco: Never Used  Substance Use Topics  . Alcohol use: No    Alcohol/week: 0.0 standard drinks  . Drug use: No    REVIEW OF SYSTEMS: General:  no fatigue, no weakness, positive lightheadedness Head:  no headaches Eyes:  no blurred vision ENT:  no sore throat Neck:  no masses CV:  no chest pain, no orthopnea Lungs:  no shortness of breath, no cough GI:  no nausea or vomiting, no diarrhea, positive bright red blood per rectum this morning, denies abdominal pain GU:  no dysuria or hematuria Skin:  no rashes or lesions Neuro:  no focal numbness or weakness, positive neuropathy Psych:  no depression or anxiety   PHYSICAL EXAM:  Vitals:   07/26/18 1633 07/26/18 1735  BP: 124/74 124/74  Pulse: 97 97  Resp:  16  Temp: 97.9 F (36.6 C)  97.9 F (36.6 C)  SpO2: 99%    No intake/output data recorded.   General:  AAOx3 NAD HEENT: MMM [place] AT anicteric sclera Neck:  No JVD, no adenopathy CV:  Heart RRR  Lungs:  L/S CTA bilaterally Abd:  abd SNT/ND with normal BS, left lower quadrant peritoneal dialysis catheter GU:  Bladder non-palpable Extremities:  No LE edema. Skin:  No skin rash Psych:  normal mood and affect Neuro:  no focal deficits   CURRENT MEDICATIONS:  . amitriptyline  10 mg Oral QHS  . amoxicillin-clavulanate  1 tablet Oral Daily  . atorvastatin  10 mg Oral QHS  . calcitRIOL  0.5 mcg Oral QHS  . calcium  acetate  2,001 mg Oral TID WC  . gabapentin  100 mg Oral TID  . insulin aspart  0-9 Units Subcutaneous TID WC  . insulin NPH Human  10 Units Subcutaneous QHS  . lidocaine (PF)  5 mL Infiltration Once  . lipase/protease/amylase  108,000 Units Oral TID WC  . multivitamin  1 tablet Oral QHS  . potassium chloride SA  20 mEq Oral QHS     HOME MEDICATIONS:  Prior to Admission medications   Medication Sig Start Date End Date Taking? Authorizing Provider  acetaminophen (TYLENOL) 325 MG tablet Take 650 mg by mouth every 4 (four) hours as needed for mild pain.    [provider]  amitriptyline (ELAVIL) 10 MG tablet Take 10 mg by mouth at bedtime.  01/19/18   [provider]  amoxicillin-clavulanate (AUGMENTIN) 500-125 MG tablet Take 1 tablet (500 mg total) by mouth daily. 07/24/18   Asencion Noble, MD  aspirin 81 MG chewable tablet Chew 1 tablet (81 mg total) by mouth daily. 07/25/18   Asencion Noble, MD  atorvastatin (LIPITOR) 10 MG tablet Take 10 mg by mouth at bedtime.     [provider]  calcitRIOL (ROCALTROL) 0.5 MCG capsule Take 0.5 mcg by mouth at bedtime.     [provider]  calcium acetate (PHOSLO) 667 MG capsule Take 2,001 mg by mouth See admin instructions. Take 3 capsules (2001 mg) by mouth three times daily with meals, take 2 capsules (1334 mg) with snacks    [provider]  gabapentin (NEURONTIN) 100 MG capsule Take 100 mg by mouth 3 (three) times daily.    [provider]  gentamicin cream (GARAMYCIN) 0.1 % Apply 1 application topically See admin instructions. Apply topically to dialysis site daily after showering    [provider]  HYDROmorphone (DILAUDID) 2 MG tablet Take 1 tablet (2 mg total) by mouth every 6 (six) hours as needed for moderate pain or severe pain. 07/24/18   Asencion Noble, MD  insulin lispro (HUMALOG) 100 UNIT/ML injection Inject 1-10 Units into the skin 3 (three) times daily before meals.  Per sliding scale    [provider]  insulin NPH Human (HUMULIN N,NOVOLIN N) 100 UNIT/ML injection Inject 0.1 mLs (10 Units total) into the skin at bedtime. 07/24/18   Asencion Noble, MD  levonorgestrel (MIRENA) 20 MCG/24HR IUD 1 each by Intrauterine route once. Implanted March 2018    [provider]  lipase/protease/amylase (CREON) 36000 UNITS CPEP capsule Take 72,000-108,000 Units by mouth See admin instructions. Take 3 capsules (108,000 units) by mouth three times daily with meals, take 2 capsules (72,000 units) with snacks    [provider]  loperamide (IMODIUM) 2 MG capsule Take 2 mg by mouth every 8 (eight) hours as needed for  diarrhea or loose stools.    [provider]  multivitamin (RENA-VIT) TABS tablet Take 1 tablet by mouth at bedtime.    [provider]  potassium chloride SA (K-DUR,KLOR-CON) 20 MEQ tablet Take 1 tablet (20 mEq total) by mouth daily. Patient taking differently: Take 20 mEq by mouth at bedtime.  07/03/18 10/01/18  Almyra Deforest, PA  Travoprost, BAK Free, (TRAVATAN) 0.004 % SOLN ophthalmic solution Place 1 drop into both eyes at bedtime.    [provider]       LABS:  CBC Latest Ref Rng & Units 07/26/2018 07/24/2018 07/23/2018  WBC 4.0 - 10.5 K/uL 15.5(H) 15.1(H) 18.0(H)  Hemoglobin 12.0 - 15.0 g/dL 9.0(L) 8.9(L) 9.2(L)  Hematocrit 36.0 - 46.0 % 28.9(L) 27.2(L) 27.8(L)  Platelets 150 - 400 K/uL 327 335 342    CMP Latest Ref Rng & Units 07/26/2018 07/22/2018 07/22/2018  Glucose 70 - 99 mg/dL 183(H) 129(H) 169(H)  BUN 6 - 20 mg/dL 22(H) 23(H) 25(H)  Creatinine 0.44 - 1.00 mg/dL 8.32(H) 7.58(H) 7.84(H)  Sodium 135 - 145 mmol/L 134(L) 135 133(L)  Potassium 3.5 - 5.1 mmol/L 4.6 3.6 3.6  Chloride 98 - 111 mmol/L 96(L) 97(L) 96(L)  CO2 22 - 32 mmol/L 23 26 25   Calcium 8.9 - 10.3 mg/dL 8.2(L) 8.0(L) 8.1(L)  Total Protein 6.5 - 8.1 g/dL 6.2(L) - -  Total Bilirubin 0.3 - 1.2 mg/dL 0.8 - -  Alkaline Phos 38 - 126 U/L  305(H) - -  AST 15 - 41 U/L 44(H) - -  ALT 0 - 44 U/L <5 - -    Lab Results  Component Value Date   PTH 1,045.8 (H) 09/24/2012   CALCIUM 8.2 (L) 07/26/2018   CAION 1.07 (L) 09/19/2017   PHOS 3.7 07/22/2018       Component Value Date/Time   COLORURINE YELLOW 09/24/2012 2007   APPEARANCEUR CLOUDY (A) 09/24/2012 2007   LABSPEC 1.008 09/24/2012 2007   PHURINE 7.0 09/24/2012 2007   GLUCOSEU NEGATIVE 09/24/2012 2007   HGBUR TRACE (A) 09/24/2012 2007   BILIRUBINUR NEGATIVE 09/24/2012 2007   KETONESUR NEGATIVE 09/24/2012 2007   PROTEINUR 100 (A) 09/24/2012 2007   UROBILINOGEN 0.2 09/24/2012 2007   NITRITE NEGATIVE 09/24/2012 2007   LEUKOCYTESUR SMALL (A) 09/24/2012 2007      Component Value Date/Time   PHART 7.269 (L) 07/19/2014 0247   PCO2ART 41.0 07/19/2014 0247   PO2ART 83.0 07/19/2014 0247   HCO3 20.1 05/14/2016 1537   HCO3 20.1 05/14/2016 1537   TCO2 30 09/19/2017 0924   ACIDBASEDEF 7.0 (H) 05/14/2016 1537   ACIDBASEDEF 7.0 (H) 05/14/2016 1537   O2SAT 36.0 05/14/2016 1537   O2SAT 36.0 05/14/2016 1537       Component Value Date/Time   IRON 20 (L) 07/17/2018 1410   TIBC 161 (L) 07/17/2018 1410   FERRITIN 28 09/25/2012 0525   IRONPCTSAT 12 07/17/2018 1410       ASSESSMENT/PLAN:     Problem List Items Addressed This Visit    None    Visit Diagnoses    Rectal bleeding    -  Primary   Weakness       Relevant Orders   DG Chest 1 View (Completed)      1.  End-stage renal disease on peritoneal dialysis.  We will continue her usual outpatient prescription.  She is on CCPD at night.  Her volume status and electrolytes are stable.  2.  GI bleed.  GI consulted.  Recent clipping of a known  gastric ulcer.  Hemodynamically stable.  3.  Right groin AV fistula infection status post bilateral iliac stents.  Continue wound VAC.  Wound care has been consulted.  4.  BMD.  Continue phosphate binders and calcitriol as per outpatient dosing.  5.  Acute blood loss anemia.   Hemoglobin stable since discharge.  Status post packed red blood cells.    [place], DO, [place]

## 2018-07-26 NOTE — Progress Notes (Signed)
Patient familiar to me from previous hospital stay and case discussed with the ER physician and the good news is her hemoglobin has not changed much but no BUN and creatinine are available and it sounds like she may have lower GI bleeding compared to ulcer bleeding and if signs of increased bleeding please proceed with nuclear medicine bleeding scan if you continue to think it was lower however if worrisome for upper bleeding please call otherwise we will ask hospital GI team to see tomorrow

## 2018-07-26 NOTE — ED Notes (Signed)
Pt sisters would like to be updated (numbers in chart)

## 2018-07-26 NOTE — Progress Notes (Signed)
New Admission Note:   Arrival Method: stretcher from ED Mental Orientation: alert and oriented x4 Telemetry: Box: 05 NSR  Assessment: Completed Skin: Intact IV: Left upper arm/ right forearm  Pain: 7 (0 out of 10) right leg grion Tubes: None Safety Measures: Safety Fall Prevention Plan has been discussed  5 [place] Orientation: Patient has been orientated to the room, unit and staff.   Family: NA  Orders to be reviewed and implemented. Will continue to monitor the patient. Call light has been placed within reach and bed alarm has been activated.   Baldo Ash, RN

## 2018-07-26 NOTE — ED Triage Notes (Signed)
Pt reports surgery to fix gastric ulcer last week. Today pt had bright red blood in stool.

## 2018-07-26 NOTE — ED Provider Notes (Signed)
[facility] EMERGENCY DEPARTMENT Provider Note   CSN: [id number] Arrival date & time: 07/26/18  1201    History   Chief Complaint Chief Complaint  Patient presents with  . Rectal Bleeding    HPI Priscilla [name] is a 48 y.o. female.     HPI  48 yo female presents today after discharge 2 days ago after admission for upper gi bleed.  Patient reports stopping regular aspirin and plavix and now taking aspirin81 mg.  She reports normal bowel movement yesterday.  Today she brbpr.  She denies pain, nausea, or vomiting.  She reports generalized weakness.  She also had right groin abscess drained during last stay.   She has a wound vac in place.  She denies fever, chills, increased pain, drainage, or redness.  Patient is on peritoneal dialysis and reports no problems with this. She required blood transfusion during last hospitalization.  No cough, fever, chest pain, known covid contacts or travel.  Past Medical History:  Diagnosis Date  . Anemia   . CAD (coronary artery disease)   . Cardiomyopathy, ischemic 06/24/2014  . Diabetes mellitus without complication ([place])   . End stage renal disease ([place])   . Foot drop, left foot   . GERD (gastroesophageal reflux disease)   . H/O kidney transplant   . Hyperlipidemia   . Hypertension   . Myocardial infarction ([place])   . 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 Renal disorder   . 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 S/P mitral valve repair 07/18/14    Patient Active Problem List   Diagnosis Date Noted  . Open wound of right hip and thigh with complication   . Malnutrition of moderate degree 07/17/2018  . GI bleed 07/12/2018  . Hypovolemic shock ([place])   . Anemia associated with acute blood loss   . 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 History of mitral valve annuloplasty #28 Memo ring 08/02/2016  . Mixed hyperlipidemia 08/02/2016  . Elevation of cardiac enzymes   . Difficult intravenous access   . DKA (diabetic  ketoacidoses) ([place]) 05/14/2016  . Microcytic anemia 05/14/2016  . Hyponatremia 05/14/2016  . Transaminitis 05/14/2016  . CHF (congestive heart failure) ([place])   . Essential hypertension 11/28/2014  . 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 Mitral regurgitation 06/29/2014  . Abnormal EKG   . Coronary artery disease involving native coronary artery of native heart without angina pectoris   . Cardiomyopathy, ischemic 06/24/2014  . Abnormal nuclear cardiac imaging test 06/24/2014  . 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 ESRD (end stage renal disease) ([place]) 09/24/2012  . Diabetes mellitus type 1 ([place]) 09/24/2012  . 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 Pancreatitis of pancreas transplant 09/24/2012    Past Surgical History:  Procedure Laterality Date  . ABDOMINAL AORTOGRAM W/LOWER EXTREMITY N/A 09/19/2017   Procedure: ABDOMINAL AORTOGRAM W/LOWER EXTREMITY;  Surgeon: Angelia Mould, MD;  Location: [facility];  Service: Cardiovascular;  Laterality: N/A;  . CAPD INSERTION N/A 09/08/2013   Procedure: Laparoscopic CAPD peritoneal dialysis catheter placement, possible lysis of adhesions   ;  Surgeon: Adin Hector, MD;  Location: [facility];  Service: General;  Laterality: N/A;  . CARDIAC CATHETERIZATION     06/2014  . CESAREAN SECTION    . COMBINED KIDNEY-PANCREAS TRANSPLANT  05/2010   [facility]..  Pancreas left mid abdomen  . CORONARY ARTERY BYPASS GRAFT N/A 07/18/2014   Procedure:  CORONARY ARTERY BYPASS GRAFTING (CABG)X2 LIMA-LAD; SVG-PD;  Surgeon: Melrose Nakayama, MD;  Location: [facility];  Service: Open Heart Surgery;  Laterality: N/A;  . ESOPHAGOGASTRODUODENOSCOPY (EGD) WITH PROPOFOL N/A 07/12/2018   Procedure: ESOPHAGOGASTRODUODENOSCOPY (EGD) WITH PROPOFOL;  Surgeon: Clarene Essex, MD;  Location: [facility];  Service: Endoscopy;  Laterality: N/A;  . ESOPHAGOGASTRODUODENOSCOPY (EGD) WITH PROPOFOL N/A 07/13/2018   Procedure:  ESOPHAGOGASTRODUODENOSCOPY (EGD) WITH PROPOFOL;  Surgeon: Ronnette Juniper, MD;  Location: [place];  Service: Gastroenterology;  Laterality: N/A;  . ESOPHAGOGASTRODUODENOSCOPY (EGD) WITH PROPOFOL N/A 07/19/2018   Procedure: ESOPHAGOGASTRODUODENOSCOPY (EGD) WITH PROPOFOL;  Surgeon: Ronnette Juniper, MD;  Location: [place] [facility];  Service: Gastroenterology;  Laterality: N/A;  . FEMORAL ARTERY EXPLORATION Right 07/07/2018   Procedure: REPAIR RIGHT COMMON FEMORAL ARTERY TO FEMORAL VEIN FISTULA  WITH VEIN PATCH ANGIOPLASTY;  Surgeon: Angelia Mould, MD;  Location: [facility];  Service: Vascular;  Laterality: Right;  . GROIN DEBRIDEMENT Right 07/22/2018   Procedure: GROIN DEBRIDEMENT , POSSIBLE VAC;  Surgeon: Rosetta Posner, MD;  Location: [facility];  Service: Vascular;  Laterality: Right;  . HEMOSTASIS CLIP PLACEMENT  07/13/2018   Procedure: HEMOSTASIS CLIP PLACEMENT;  Surgeon: Ronnette Juniper, MD;  Location: [place];  Service: Gastroenterology;;  . HOT HEMOSTASIS N/A 07/13/2018   Procedure: HOT HEMOSTASIS (ARGON PLASMA COAGULATION/BICAP);  Surgeon: Ronnette Juniper, MD;  Location: [place];  Service: Gastroenterology;  Laterality: N/A;  . HOT HEMOSTASIS N/A 07/19/2018   Procedure: HOT HEMOSTASIS (ARGON PLASMA COAGULATION/BICAP);  Surgeon: Ronnette Juniper, MD;  Location: [facility];  Service: Gastroenterology;  Laterality: N/A;  . INSERTION OF DIALYSIS CATHETER  September 28, 2012   Right upper chest  . LAPAROSCOPIC INSERTION PERITONEAL CATHETER  07/18/2008   Dr Excell Seltzer  . LAPAROSCOPIC REPOSITIONING CAPD CATHETER  07/11/202010   Dr Excell Seltzer  . LAPAROSCOPIC REPOSITIONING CAPD CATHETER  11/08/2008   Dr Excell Seltzer  . LAPAROSCOPIC REPOSITIONING CAPD CATHETER  10/03/2009   Unplugging CAPD catheter Dr Excell Seltzer  . LEFT HEART CATHETERIZATION WITH CORONARY ANGIOGRAM N/A 06/28/2014   Procedure: LEFT HEART CATHETERIZATION WITH CORONARY ANGIOGRAM;  Surgeon: [name], MD;  Location: [facility] CATH LAB;  Service: Cardiovascular;  Laterality:  N/A;  . MITRAL VALVE REPAIR N/A 07/18/2014   Procedure: MITRAL VALVE REPAIR (MVR);  Surgeon: Melrose Nakayama, MD;  Location: [facility];  Service: Open Heart Surgery;  Laterality: N/A;  . OMENTECTOMY  11/08/2008   Dr Excell Seltzer  . PERIPHERAL VASCULAR INTERVENTION Bilateral 09/19/2017   Procedure: PERIPHERAL VASCULAR INTERVENTION;  Surgeon: Angelia Mould, MD;  Location: [facility] CV [facility];  Service: Cardiovascular;  Laterality: Bilateral;  Iliac stents  . SCLEROTHERAPY  07/13/2018   Procedure: Clide Deutscher;  Surgeon: Ronnette Juniper, MD;  Location: [facility];  Service: Gastroenterology;;  . SCLEROTHERAPY  07/19/2018   Procedure: Clide Deutscher;  Surgeon: Ronnette Juniper, MD;  Location: [facility];  Service: Gastroenterology;;  . TEE WITHOUT CARDIOVERSION N/A 07/18/2014   Procedure: TRANSESOPHAGEAL ECHOCARDIOGRAM (TEE);  Surgeon: Melrose Nakayama, MD;  Location: [facility];  Service: Open Heart Surgery;  Laterality: N/A;     OB History   No obstetric history on file.      Home Medications    Prior to Admission medications   Medication Sig Start Date End Date Taking? Authorizing Provider  acetaminophen (TYLENOL) 325 MG tablet Take 650 mg by mouth every 4 (four) hours as needed for mild pain.    [provider]  amitriptyline (ELAVIL) 10 MG tablet Take 10 mg by mouth at bedtime.  01/19/18  [provider]  amoxicillin-clavulanate (AUGMENTIN) 500-125 MG tablet Take 1 tablet (500 mg total) by mouth daily. 07/24/18   Asencion Noble, MD  aspirin 81 MG chewable tablet Chew 1 tablet (81 mg total) by mouth daily. 07/25/18   Asencion Noble, MD  atorvastatin (LIPITOR) 10 MG tablet Take 10 mg by mouth at bedtime.     [provider]  calcitRIOL (ROCALTROL) 0.5 MCG capsule Take 0.5 mcg by mouth at bedtime.     [provider]  calcium acetate (PHOSLO) 667 MG capsule Take 2,001 mg by mouth See admin instructions. Take 3 capsules (2001 mg) by mouth three times  daily with meals, take 2 capsules (1334 mg) with snacks    [provider]  gabapentin (NEURONTIN) 100 MG capsule Take 100 mg by mouth 3 (three) times daily.    [provider]  gentamicin cream (GARAMYCIN) 0.1 % Apply 1 application topically See admin instructions. Apply topically to dialysis site daily after showering    [provider]  HYDROmorphone (DILAUDID) 2 MG tablet Take 1 tablet (2 mg total) by mouth every 6 (six) hours as needed for moderate pain or severe pain. 07/24/18   Asencion Noble, MD  insulin lispro (HUMALOG) 100 UNIT/ML injection Inject 1-10 Units into the skin 3 (three) times daily before meals. Per sliding scale    [provider]  insulin NPH Human (HUMULIN N,NOVOLIN N) 100 UNIT/ML injection Inject 0.1 mLs (10 Units total) into the skin at bedtime. 07/24/18   Asencion Noble, MD  levonorgestrel (MIRENA) 20 MCG/24HR IUD 1 each by Intrauterine route once. Implanted March 2018    [provider]  lipase/protease/amylase (CREON) 36000 UNITS CPEP capsule Take 72,000-108,000 Units by mouth See admin instructions. Take 3 capsules (108,000 units) by mouth three times daily with meals, take 2 capsules (72,000 units) with snacks    [provider]  loperamide (IMODIUM) 2 MG capsule Take 2 mg by mouth every 8 (eight) hours as needed for diarrhea or loose stools.    [provider]  multivitamin (RENA-VIT) TABS tablet Take 1 tablet by mouth at bedtime.    [provider]  potassium chloride SA (K-DUR,KLOR-CON) 20 MEQ tablet Take 1 tablet (20 mEq total) by mouth daily. Patient taking differently: Take 20 mEq by mouth at bedtime.  07/03/18 10/01/18  Almyra Deforest, PA  Travoprost, BAK Free, (TRAVATAN) 0.004 % SOLN ophthalmic solution Place 1 drop into both eyes at bedtime.    [provider]    Family History Family History  Problem Relation Age of Onset  . CAD Mother   . Stroke Mother   . COPD Father   .  Colon cancer Neg Hx     Social History Social History   Tobacco Use  . Smoking status: Never Smoker  . Smokeless tobacco: Never Used  Substance Use Topics  . Alcohol use: No    Alcohol/week: 0.0 standard drinks  . Drug use: No     Allergies   Pork-derived products   Review of Systems Review of Systems  All other systems reviewed and are negative.    Physical Exam Updated Vital Signs BP (!) 114/58   Pulse 74   Temp 99 F (37.2 C) (Oral)   Ht 1.524 m (5')   Wt 56.7 kg   SpO2 99%   BMI 24.41 kg/m   Physical Exam Vitals signs and nursing note reviewed.  Constitutional:      Appearance: Normal appearance.  HENT:  Head: Normocephalic.     Right Ear: External ear normal.     Left Ear: External ear normal.     Nose: Nose normal.  Eyes:     Extraocular Movements: Extraocular movements intact.     Pupils: Pupils are equal, round, and reactive to light.  Neck:     Musculoskeletal: Normal range of motion and neck supple.  Cardiovascular:     Rate and Rhythm: Normal rate and regular rhythm.  Pulmonary:     Effort: Pulmonary effort is normal.     Breath sounds: Normal breath sounds.  Abdominal:     General: Abdomen is flat.     Palpations: Abdomen is soft.     Comments: Catheters in place  Genitourinary:    Comments: Rectal exam with maroon stool Musculoskeletal: Normal range of motion.     Comments: Right groin wound vac and dressing in place Splint in place for foot drop left foot  Skin:    General: Skin is warm and dry.     Capillary Refill: Capillary refill takes less than 2 seconds.  Neurological:     General: No focal deficit present.     Mental Status: She is alert and oriented to person, place, and time.  Psychiatric:        Mood and Affect: Mood normal.        Behavior: Behavior normal.      ED Treatments / Results  Labs (all labs ordered are listed, but only abnormal results are displayed) Labs Reviewed  CBC  COMPREHENSIVE METABOLIC  PANEL  CBC WITH DIFFERENTIAL/PLATELET  PROTIME-INR  I-STAT BETA HCG BLOOD, ED ([facility], [facility], [facility] ONLY)  TYPE AND SCREEN  TYPE AND SCREEN    EKG None  Radiology No results found.  Procedures Procedures (including critical care time)  Medications Ordered in ED Medications - No data to display   Initial Impression / Assessment and Plan / ED Course  I have reviewed the triage vital signs and the nursing notes.  Pertinent labs & imaging results that were available during my care of the patient were reviewed by me and considered in my medical decision making (see chart for details).       Rectal bleeding with known ugi.  Discussed with Dr. Watt Climes and will proceed with iv protonix.  Labs pending.  Will discuss with IM teaching service.  Dr. Watt Climes will follow. Advises consider  Tagged rbc study.  Patient currently stable but known significant anemia.  Will obs closely for hemodynamic instability. Vitals:   07/26/18 1215 07/26/18 1230  BP: (!) 127/92 (!) 131/55  Pulse:    Temp:    SpO2:     1-GI bleeding - patient with known upper gi bleed.  Maroon stool here on exam. GI consulted.  Hgb stable, hr and bp stable at this time IV protonix given 2- right groin abcess-s/p debridement- ? Leukocytosis due to infection. 3- Patient on peritoneal dialysis.    Discussed IM teaching team and will see for admission Cmet pending  Discussed with Dr. Roderic Palau and he will follow up labs.  Final Clinical Impressions(s) / ED Diagnoses   Final diagnoses:  Weakness  Rectal bleeding    ED Discharge Orders    None       Pattricia Boss, MD 07/26/18 1504

## 2018-07-26 NOTE — H&P (Signed)
Date: 07/26/2018               Patient Name:  Priscilla [name] MRN: [id number]  DOB: [birth year] Age / Sex: 48 y.o., female   PCP: [name], MD         Medical Service: Internal Medicine Teaching Service         Attending Physician: Dr. Aldine Contes, MD    First Contact: Dr. Sherry Ruffing Pager: [phone]  Second Contact: Dr. 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 She was discharged from the hospital two days ago after an admission for upper GIB found to be 2/2 a bleeding gastric ulcer while on DAPT with aspirin and plavix. She received epinephrine injection, cautery, and endo clip placement, and her plavix was discontinued. She remained on aspirin. During the same hospitalization, she was found to have an infection of her right groin AVF s/p bilateral iliac stents. She underwent surgical repair and a wound vac was placed. She still has her wound vac and has been taking augmentin as prescribed.   She reports that she has felt well since discharge except for generalized weakness after a prolonged hospital stay. This morning she had a bright red bowel movement. Prior to today, her BMs were brown and formed. She endorses lightheadedness with standing. She denies headache, dyspnea, abdominal pain, n/v.   She has maintained the wound vac since discharge. She has not had contact with the home health nurses yet, and she would like clarification about who will be helping her with the wound vac  at home. The ED nurses helped her change it this morning.   Upon arrival to the ED, the patient was afebrile. An initial BP was 85/67, but subsequent measurements were all normotensive. Labs significant for leukocytosis to 15 (stable from discharge), Hb 9 (stable from discharge), PT 18, INR 1.6. CXR with stable left basilar atelectasis/infiltrate with associated pleural effusion. The patient received IV protonix.   Meds:  No current facility-administered medications on file prior to encounter.    Current Outpatient Medications on File Prior to Encounter  Medication Sig Dispense Refill  . acetaminophen (TYLENOL) 325 MG tablet Take 650 mg by mouth every 4 (four) hours as needed for mild pain.    Marland Kitchen amitriptyline (ELAVIL) 10 MG tablet Take 10 mg by mouth at bedtime.   3  . amoxicillin-clavulanate (AUGMENTIN) 500-125 MG tablet Take 1 tablet (500 mg total) by mouth daily. 4 tablet 0  . aspirin 81 MG chewable tablet Chew 1 tablet (81 mg total) by mouth daily. 30 tablet 0  . atorvastatin (LIPITOR) 10 MG tablet Take 10 mg by mouth at bedtime.     . calcitRIOL (ROCALTROL) 0.5 MCG capsule Take 0.5 mcg by mouth at bedtime.     . calcium acetate (PHOSLO) 667 MG capsule Take 2,001 mg by mouth See admin instructions. Take 3 capsules (2001 mg) by mouth three times daily with meals, take 2 capsules (1334 mg) with snacks    . gabapentin (NEURONTIN) 100 MG capsule Take 100  mg by mouth 3 (three) times daily.    Marland Kitchen gentamicin cream (GARAMYCIN) 0.1 % Apply 1 application topically See admin instructions. Apply topically to dialysis site daily after showering    . HYDROmorphone (DILAUDID) 2 MG tablet Take 1 tablet (2 mg total) by mouth every 6 (six) hours as needed for moderate pain or severe pain. 20 tablet 0  . insulin lispro (HUMALOG) 100 UNIT/ML injection Inject 1-10 Units into the skin 3 (three) times daily before meals. Per sliding scale    . insulin NPH Human (HUMULIN N,NOVOLIN N) 100 UNIT/ML injection Inject 0.1  mLs (10 Units total) into the skin at bedtime. 10 mL 11  . levonorgestrel (MIRENA) 20 MCG/24HR IUD 1 each by Intrauterine route once. Implanted March 2018    . lipase/protease/amylase (CREON) 36000 UNITS CPEP capsule Take 72,000-108,000 Units by mouth See admin instructions. Take 3 capsules (108,000 units) by mouth three times daily with meals, take 2 capsules (72,000 units) with snacks    . loperamide (IMODIUM) 2 MG capsule Take 2 mg by mouth every 8 (eight) hours as needed for diarrhea or loose stools.    . multivitamin (RENA-VIT) TABS tablet Take 1 tablet by mouth at bedtime.    . potassium chloride SA (K-DUR,KLOR-CON) 20 MEQ tablet Take 1 tablet (20 mEq total) by mouth daily. (Patient taking differently: Take 20 mEq by mouth at bedtime. ) 90 tablet 0  . Travoprost, BAK Free, (TRAVATAN) 0.004 % SOLN ophthalmic solution Place 1 drop into both eyes at bedtime.        Allergies: Allergies as of 07/26/2018 - Review Complete 07/26/2018  Allergen Reaction Noted  . Pork-derived products Other (See Comments) 09/25/2012   Past Medical History:  Diagnosis Date  . Anemia   . CAD (coronary artery disease)   . Cardiomyopathy, ischemic 06/24/2014  . Diabetes mellitus without complication ([place])   . End stage renal disease ([place])   . Foot drop, left foot   . GERD (gastroesophageal reflux disease)   . H/O kidney transplant   . Hyperlipidemia   . Hypertension   . Myocardial infarction ([place])   . Pancreas transplanted ([place])   . Renal disorder   . 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 S/P mitral valve repair 07/18/14   Past Surgical History:  Procedure Laterality Date  . ABDOMINAL AORTOGRAM W/LOWER EXTREMITY N/A 09/19/2017   Procedure: ABDOMINAL AORTOGRAM W/LOWER EXTREMITY;  Surgeon: Angelia Mould, MD;  Location: [facility];  Service: Cardiovascular;  Laterality: N/A;  . CAPD INSERTION N/A 09/08/2013   Procedure: Laparoscopic CAPD peritoneal dialysis catheter placement, possible lysis of adhesions    ;  Surgeon: Adin Hector, MD;  Location: [facility];  Service: General;  Laterality: N/A;  . CARDIAC CATHETERIZATION     06/2014  . CESAREAN SECTION    . COMBINED KIDNEY-PANCREAS TRANSPLANT  05/2010   [facility]  Pancreas left mid abdomen  . CORONARY ARTERY BYPASS GRAFT N/A 07/18/2014   Procedure: CORONARY ARTERY BYPASS GRAFTING (CABG)X2 LIMA-LAD; SVG-PD;  Surgeon: Melrose Nakayama, MD;  Location: [facility];  Service: Open Heart Surgery;  Laterality: N/A;  . ESOPHAGOGASTRODUODENOSCOPY (EGD) WITH PROPOFOL N/A 07/12/2018   Procedure: ESOPHAGOGASTRODUODENOSCOPY (EGD) WITH PROPOFOL;  Surgeon: Clarene Essex, MD;  Location: [place];  Service: Endoscopy;  Laterality: N/A;  . ESOPHAGOGASTRODUODENOSCOPY (EGD) WITH PROPOFOL N/A 07/13/2018   Procedure: ESOPHAGOGASTRODUODENOSCOPY (EGD) WITH PROPOFOL;  Surgeon: Ronnette Juniper, MD;  Location: [place];  Service: Gastroenterology;  Laterality: N/A;  . ESOPHAGOGASTRODUODENOSCOPY (EGD)  WITH PROPOFOL N/A 07/19/2018   Procedure: ESOPHAGOGASTRODUODENOSCOPY (EGD) WITH PROPOFOL;  Surgeon: Ronnette Juniper, MD;  Location: [place];  Service: Gastroenterology;  Laterality: N/A;  . FEMORAL ARTERY EXPLORATION Right 07/07/2018   Procedure: REPAIR RIGHT COMMON FEMORAL ARTERY TO FEMORAL VEIN FISTULA  WITH VEIN PATCH ANGIOPLASTY;  Surgeon: Angelia Mould, MD;  Location: [place];  Service: Vascular;  Laterality: Right;  . GROIN DEBRIDEMENT Right 07/22/2018   Procedure: GROIN DEBRIDEMENT , POSSIBLE VAC;  Surgeon: Rosetta Posner, MD;  Location: [place];  Service: Vascular;  Laterality: Right;  . HEMOSTASIS CLIP PLACEMENT  07/13/2018   Procedure: HEMOSTASIS CLIP PLACEMENT;  Surgeon: Ronnette Juniper, MD;  Location: [place];  Service: Gastroenterology;;  . HOT HEMOSTASIS N/A 07/13/2018   Procedure: HOT HEMOSTASIS (ARGON PLASMA COAGULATION/BICAP);  Surgeon: Ronnette Juniper, MD;  Location: [facility];  Service: Gastroenterology;  Laterality: N/A;  . HOT HEMOSTASIS N/A  07/19/2018   Procedure: HOT HEMOSTASIS (ARGON PLASMA COAGULATION/BICAP);  Surgeon: Ronnette Juniper, MD;  Location: [facility];  Service: Gastroenterology;  Laterality: N/A;  . INSERTION OF DIALYSIS CATHETER  September 28, 2012   Right upper chest  . LAPAROSCOPIC INSERTION PERITONEAL CATHETER  07/18/2008   Dr Excell Seltzer  . LAPAROSCOPIC REPOSITIONING CAPD CATHETER  08/16/202010   Dr Excell Seltzer  . LAPAROSCOPIC REPOSITIONING CAPD CATHETER  11/08/2008   Dr Excell Seltzer  . LAPAROSCOPIC REPOSITIONING CAPD CATHETER  10/03/2009   Unplugging CAPD catheter Dr Excell Seltzer  . LEFT HEART CATHETERIZATION WITH CORONARY ANGIOGRAM N/A 06/28/2014   Procedure: LEFT HEART CATHETERIZATION WITH CORONARY ANGIOGRAM;  Surgeon: [name], MD;  Location: [place] [facility];  Service: Cardiovascular;  Laterality: N/A;  . MITRAL VALVE REPAIR N/A 07/18/2014   Procedure: MITRAL VALVE REPAIR (MVR);  Surgeon: Melrose Nakayama, MD;  Location: [facility];  Service: Open Heart Surgery;  Laterality: N/A;  . OMENTECTOMY  11/08/2008   Dr Excell Seltzer  . PERIPHERAL VASCULAR INTERVENTION Bilateral 09/19/2017   Procedure: PERIPHERAL VASCULAR INTERVENTION;  Surgeon: Angelia Mould, MD;  Location: [place] [facility];  Service: Cardiovascular;  Laterality: Bilateral;  Iliac stents  . SCLEROTHERAPY  07/13/2018   Procedure: Clide Deutscher;  Surgeon: Ronnette Juniper, MD;  Location: [facility];  Service: Gastroenterology;;  . SCLEROTHERAPY  07/19/2018   Procedure: Clide Deutscher;  Surgeon: Ronnette Juniper, MD;  Location: [place];  Service: Gastroenterology;;  . TEE WITHOUT CARDIOVERSION N/A 07/18/2014   Procedure: TRANSESOPHAGEAL ECHOCARDIOGRAM (TEE);  Surgeon: Melrose Nakayama, MD;  Location: [place];  Service: Open Heart Surgery;  Laterality: N/A;    Family History:  Family History  Problem Relation Age of Onset  . CAD Mother   . Stroke Mother   . COPD Father   . Colon cancer Neg Hx    Social History: Lives with her two sisters and her son. Her son helps her  with certain activities, including walking to the bathroom. Never smoker. Denies etoh and illicit drug use.   Review of Systems: A complete ROS was negative except as per HPI.   Physical Exam: Blood pressure 116/90, pulse 74, temperature 99 F (37.2 C), temperature source Oral, height 5' (1.524 m), weight 56.7 kg, SpO2 99 %.  Constitutional: Well-developed, well-nourished, and in no distress.  Eyes: Pupils are equal, round, and reactive to light. EOM are normal.  Cardiovascular: Normal rate and regular rhythm. Systolic murmur. Pulmonary/Chest: Effort normal. Clear to auscultation bilaterally. No wheezes, rales, or rhonchi. Abdominal: Bowel sounds present. Soft, non-distended, non-tender. PD catheter in the LLQ. Clean, dry, and intact. Ext: Right groin  wound with wound vac. Trace pitting edema BLE. Skin: Warm and dry. No rashes or wounds.  CXR: personally reviewed my interpretation is stable left basilar atelectasis/infiltrate with associated pleural effusion.  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   GI Bleed - Grossly bloody stool on EDP evaluation. Hb stable from two days ago. Hemodynamically stable. Patient is on aspirin at home for bilateral iliac stents.  - Etiology is likely the known gastric ulcer and surrounding irritated mucosa s/p epinephrine injection and clip placement. However, her initial symptoms with the gastric ulcer were hematemesis. This bleed may represent a second source of lower GI bleeding as she has bright red blood per rectum that does  not appear brisk. - GI consulted by EDP.  Plan - GI following, appreciate recs - Type and cross - Tele - Trend CBC - IV protonix - Hold aspirin - Consider tagged RBC scan per GI   Right groin AVF infection s/p bilateral iliac stents - Mild pain at the site. Wound vac in place. Afebrile. Persistent leukocytosis to 15.  Plan - Continue wound vac, wound care consulted - Continue augmentin   ESRD on PD - On home PD, which she performs every night. Last dialyzed last night. Catheter site without signs of infection. No metabolic panel was drawn in the ED as venous access was difficult. Plan - Nephrology consulted, continue PD per nephro - CMP - Continue home calcitriol, phoslo  DMI - A1c was 11 earlier this month. Home regimen includes humulin 10u qhs and humalog SSI TID with meals Plan - Humulin 10u qhs - Renal SSI - Continue home gabapentin 100mg  TID   Pancreatic insufficiency: Continue home creon HLD: Continue home lipitor Chronic pain: Continue home dilaudid 2mg  q6hrs prn Insomnia: continue home amitriptyline   FEN: no IV fluids, clear liquid diet, replace electrolytes as needed  DVT ppx: SCDs Code status: FULL code  Dispo: Admit patient to Observation with expected length of stay less than 2 midnights.  Signed: Corinne Ports, MD 07/26/2018, 3:49 PM  Pager: [phone]

## 2018-07-27 DIAGNOSIS — E1051 Type 1 diabetes mellitus with diabetic peripheral angiopathy without gangrene: Secondary | ICD-10-CM

## 2018-07-27 DIAGNOSIS — Z79899 Other long term (current) drug therapy: Secondary | ICD-10-CM

## 2018-07-27 DIAGNOSIS — T8612 Kidney transplant failure: Secondary | ICD-10-CM | POA: Diagnosis present

## 2018-07-27 DIAGNOSIS — K210 Gastro-esophageal reflux disease with esophagitis: Secondary | ICD-10-CM | POA: Diagnosis present

## 2018-07-27 DIAGNOSIS — T827XXA Infection and inflammatory reaction due to other cardiac and vascular devices, implants and grafts, initial encounter: Secondary | ICD-10-CM

## 2018-07-27 DIAGNOSIS — Z7982 Long term (current) use of aspirin: Secondary | ICD-10-CM | POA: Diagnosis not present

## 2018-07-27 DIAGNOSIS — I132 Hypertensive heart and chronic kidney disease with heart failure and with stage 5 chronic kidney disease, or end stage renal disease: Secondary | ICD-10-CM | POA: Diagnosis present

## 2018-07-27 DIAGNOSIS — I120 Hypertensive chronic kidney disease with stage 5 chronic kidney disease or end stage renal disease: Secondary | ICD-10-CM | POA: Diagnosis not present

## 2018-07-27 DIAGNOSIS — E1022 Type 1 diabetes mellitus with diabetic chronic kidney disease: Secondary | ICD-10-CM | POA: Diagnosis present

## 2018-07-27 DIAGNOSIS — Z8249 Family history of ischemic heart disease and other diseases of the circulatory system: Secondary | ICD-10-CM | POA: Diagnosis not present

## 2018-07-27 DIAGNOSIS — E785 Hyperlipidemia, unspecified: Secondary | ICD-10-CM | POA: Diagnosis present

## 2018-07-27 DIAGNOSIS — Y832 Surgical operation with anastomosis, bypass or graft as the cause of abnormal reaction of the patient, or of later complication, without mention of misadventure at the time of the procedure: Secondary | ICD-10-CM | POA: Diagnosis present

## 2018-07-27 DIAGNOSIS — I255 Ischemic cardiomyopathy: Secondary | ICD-10-CM | POA: Diagnosis present

## 2018-07-27 DIAGNOSIS — Z823 Family history of stroke: Secondary | ICD-10-CM | POA: Diagnosis not present

## 2018-07-27 DIAGNOSIS — Z8679 Personal history of other diseases of the circulatory system: Secondary | ICD-10-CM

## 2018-07-27 DIAGNOSIS — T86891 Other transplanted tissue failure: Secondary | ICD-10-CM | POA: Diagnosis present

## 2018-07-27 DIAGNOSIS — N186 End stage renal disease: Secondary | ICD-10-CM

## 2018-07-27 DIAGNOSIS — Z940 Kidney transplant status: Secondary | ICD-10-CM | POA: Diagnosis not present

## 2018-07-27 DIAGNOSIS — Z951 Presence of aortocoronary bypass graft: Secondary | ICD-10-CM | POA: Diagnosis not present

## 2018-07-27 DIAGNOSIS — K922 Gastrointestinal hemorrhage, unspecified: Secondary | ICD-10-CM | POA: Diagnosis not present

## 2018-07-27 DIAGNOSIS — Z978 Presence of other specified devices: Secondary | ICD-10-CM

## 2018-07-27 DIAGNOSIS — Q211 Atrial septal defect: Secondary | ICD-10-CM | POA: Diagnosis not present

## 2018-07-27 DIAGNOSIS — Z95820 Peripheral vascular angioplasty status with implants and grafts: Secondary | ICD-10-CM

## 2018-07-27 DIAGNOSIS — K209 Esophagitis, unspecified: Secondary | ICD-10-CM | POA: Diagnosis not present

## 2018-07-27 DIAGNOSIS — R531 Weakness: Secondary | ICD-10-CM

## 2018-07-27 DIAGNOSIS — Z794 Long term (current) use of insulin: Secondary | ICD-10-CM | POA: Diagnosis not present

## 2018-07-27 DIAGNOSIS — Z9483 Pancreas transplant status: Secondary | ICD-10-CM

## 2018-07-27 DIAGNOSIS — I252 Old myocardial infarction: Secondary | ICD-10-CM | POA: Diagnosis not present

## 2018-07-27 DIAGNOSIS — J9811 Atelectasis: Secondary | ICD-10-CM | POA: Diagnosis present

## 2018-07-27 DIAGNOSIS — N2581 Secondary hyperparathyroidism of renal origin: Secondary | ICD-10-CM | POA: Diagnosis present

## 2018-07-27 DIAGNOSIS — Y830 Surgical operation with transplant of whole organ as the cause of abnormal reaction of the patient, or of later complication, without mention of misadventure at the time of the procedure: Secondary | ICD-10-CM | POA: Diagnosis present

## 2018-07-27 DIAGNOSIS — K8689 Other specified diseases of pancreas: Secondary | ICD-10-CM

## 2018-07-27 DIAGNOSIS — Z992 Dependence on renal dialysis: Secondary | ICD-10-CM

## 2018-07-27 DIAGNOSIS — I739 Peripheral vascular disease, unspecified: Secondary | ICD-10-CM

## 2018-07-27 DIAGNOSIS — J90 Pleural effusion, not elsewhere classified: Secondary | ICD-10-CM | POA: Diagnosis present

## 2018-07-27 DIAGNOSIS — G4700 Insomnia, unspecified: Secondary | ICD-10-CM

## 2018-07-27 DIAGNOSIS — K625 Hemorrhage of anus and rectum: Secondary | ICD-10-CM | POA: Diagnosis present

## 2018-07-27 DIAGNOSIS — K254 Chronic or unspecified gastric ulcer with hemorrhage: Secondary | ICD-10-CM | POA: Diagnosis present

## 2018-07-27 DIAGNOSIS — I2510 Atherosclerotic heart disease of native coronary artery without angina pectoris: Secondary | ICD-10-CM | POA: Diagnosis present

## 2018-07-27 LAB — CBC
HCT: 25.3 % — ABNORMAL LOW (ref 36.0–46.0)
HEMOGLOBIN: 8.3 g/dL — AB (ref 12.0–15.0)
MCH: 29.5 pg (ref 26.0–34.0)
MCHC: 32.8 g/dL (ref 30.0–36.0)
MCV: 90 fL (ref 80.0–100.0)
Platelets: 278 10*3/uL (ref 150–400)
RBC: 2.81 MIL/uL — ABNORMAL LOW (ref 3.87–5.11)
RDW: 16.7 % — ABNORMAL HIGH (ref 11.5–15.5)
WBC: 13.1 10*3/uL — ABNORMAL HIGH (ref 4.0–10.5)
nRBC: 0.5 % — ABNORMAL HIGH (ref 0.0–0.2)

## 2018-07-27 LAB — GLUCOSE, CAPILLARY
Glucose-Capillary: 271 mg/dL — ABNORMAL HIGH (ref 70–99)
Glucose-Capillary: 191 mg/dL — ABNORMAL HIGH (ref 70–99)
Glucose-Capillary: 125 mg/dL — ABNORMAL HIGH (ref 70–99)
Glucose-Capillary: 179 mg/dL — ABNORMAL HIGH (ref 70–99)

## 2018-07-27 LAB — TYPE AND SCREEN
ABO/RH(D): O POS
Antibody Screen: NEGATIVE

## 2018-07-27 MED ORDER — SODIUM CHLORIDE 0.9 % IV SOLN
250.0000 mg | Freq: Every day | INTRAVENOUS | Status: AC
  Administered 2018-07-27 – 2018-07-28 (×2): 250 mg via INTRAVENOUS
  Filled 2018-07-27 (×2): qty 20

## 2018-07-27 MED ORDER — PANTOPRAZOLE SODIUM 40 MG IV SOLR
40.0000 mg | Freq: Two times a day (BID) | INTRAVENOUS | Status: DC
  Administered 2018-07-27 – 2018-07-28 (×2): 40 mg via INTRAVENOUS
  Filled 2018-07-27 (×2): qty 40

## 2018-07-27 MED ORDER — LATANOPROST 0.005 % OP SOLN
1.0000 [drp] | Freq: Every day | OPHTHALMIC | Status: DC
  Filled 2018-07-27: qty 2.5

## 2018-07-27 NOTE — Progress Notes (Signed)
Subjective: [name] reports that she is doing okay today, she states that she is very tired.  No acute events overnight.  She reports that she is still having dark stools, had to last night.  She did confirm that she has been having maroon-colored stools, not bright red.  She reports that she has occasional pain in her right groin, especially when the wound VAC is on.  She denies any hematemesis, or any other signs of bleeding.  We discussed the plan for today and she is in agreement.  All questions were answered.  Objective:  Vital signs in last 24 hours: Vitals:   07/26/18 1633 07/26/18 1735 07/26/18 2132 07/27/18 0435  BP: 124/74 124/74 108/63 111/68  Pulse: 97 97 83 85  Resp:  16 16 18   Temp: 97.9 F (36.6 C) 97.9 F (36.6 C) 98.1 F (36.7 C) 98.2 F (36.8 C)  TempSrc: Oral Oral Oral Oral  SpO2: 99%  100% 99%  Weight:  56.7 kg    Height:  5' (1.524 m)      General: Tired appearing female, no acute distress, sitting comfortably in bed Cardiac: Regular rate and rhythm, systolic murmur Pulmonary: CTA BL, normal work of breathing, no wheezing or rhonchi Abdomen: Soft, nontender, nondistended, normoactive bowel sounds Extremity: Right groin wound VAC in place, no erythema or edema around the area Psychiatry: Normal mood and affect    Assessment/Plan:  Active Problems:   Lower GI bleed  This is a 48 year old female with a history of end stage renal disease status post renal transplant failure on peritoneal dialysis, diabetes mellitus type 1, pancreatic insufficiency status post pancreas transplant, PVD status post bilateral iliac stents, ischemic cardiomyopathy, mitral regurgitation status post repair, CABG x2, hypertension who was recently admitted for a UGIB 2/2 bleeding gastric ulcer requiring ICU care with pressors, after discussion about risks and benefits of restarting her anticogulation she was more inclined to continue it to improve her vasculature, plavix was  discontinued and aspirin was continued at that time. She presented to the ED with generalized weakness, and hematochezia. Virals were relatively normal, leukocytosis of 15 (improved from discharge), and Hgb was stable at 9.    GI bleed, hematochezia/Melena: On admission she had noted an episode of bright red blood concerning for lower GI bleed she was noted to have maroon-colored stools per EDP.  Today she is reporting dark red/maroon-colored stools, she has had 2 episodes of melena yesterday.  Vitals have been relatively normal, blood pressure is on the softer side.  Currently holding her aspirin.  Given her dark stools this may be more consistent with a upper GI bleed seen on her previous admission. Hgb today has not resulted. She may need repeat endoscopic evaluation. -Gastroenterology following, appreciate recommendations -Continue PPI BID -CBC twice daily -Transfuse for Hgb <7 -Continue to hold ASA -Keep n.p.o. for now  Right groin AVF infection s/p bilateral iliac stents: Patient reports that the pain medications have been helping, she is currently on Dilaudid every 6 as needed.  Currently has the wound VAC in place, no signs of infection at this time.  Continue wound care for this. -Holding ASA -Conitnue wound vac -Continue augmentin -Continue dilaudid 2 mg q6 hr PRN  ESRD on PD: -She has been doing her home PT at night, she denies any issues.  CMP showed no acute need for emergent dialysis.  Nephrology following, appreciate recommendations -Continue PD at night -Daily BMP  DM1: -Continue home 10 units nightly -SSI-sensitive -Continue home  gabapentin 100 mg TID  Pancreatic insufficiency: Continue home creon HLD: Continue ome lipitor Insomnia: Continue home amitriptyline  FEN: No fluids, replete lytes prn, CLD diet  VTE ppx: SCDs Code Status: FULL    Dispo: Anticipated discharge is pending clinical improvement.   Priscilla Noble, MD 07/27/2018, 6:27 AM Pager:  [phone]

## 2018-07-27 NOTE — Consult Note (Addendum)
[place] Nurse wound consult note Patient receiving care in [facility] 5M05.  No visitors present.  Patient currently with home KCI VAC to right groin surgical wound.  One piece of black foam removed, one piece placed.  Home VAC at 125 mmHg.  Hospital VAC placed to 125 mmHg pressure.  Immediate seal obtained. Reason for Consult: Wound VAC to right groin Wound type: surgical Measurement:7.8 cm x 3.8 cm x 3 cm Wound bed:50% pink, 50% yellow tinged (doesn't really appear consistent with slough however). Drainage (amount, consistency, odor) Serous Periwound:Intact Dressing procedure/placement/frequency: MWF VAC dressing by bedside RN.  Patient's primary RN, Cami, observed today and stated she does think bedside RNs can perform this dressing change.  It really is a straighforward remove one piece of black foam, cut and place one piece of black foam, using one flattened barrier ring along the crease between the wound and the labia.  Orders to this effect are being entered. Monitor the wound area(s) for worsening of condition such as: Signs/symptoms of infection,  Increase in size,  Development of or worsening of odor, Development of pain, or increased pain at the affected locations.  Notify the medical team if any of these develop.  Thank you for the consult.  Discussed plan of care with the patient and bedside nurse.  [place] nurse will not follow at this time.  Please re-consult the [place] team if needed.  Val Riles, RN, MSN, CWOCN, CNS-BC, pager [phone]

## 2018-07-27 NOTE — Progress Notes (Addendum)
[facility] Progress Note   Dialysis Orders:  Assessment/Plan: 1. GIB - hgb stable 2. ESRD -CCPD - used all 1.5s last night K 4.6 3/29 d/c K- change to half 1.5 and half 2.5 tonight if not d/c - check am labs 3. Anemia - hgb 8.9 at d/c 9 today - stable had partial course of IV Fe last admission - ARanesp 100 3/27 - will finish course ferrlicit 854 x 2  4. Secondary hyperparathyroidism - resume daily calcitriol/binders 5. HTN/volume - net UF 222   6. Nutrition - alb 1.8 ongoing challenge -protein encouraged 7. Right groin wound  - WVC 15 K stable on Augmentin   [name], PA-C [facility] Beeper [phone] 07/27/2018,10:01 AM  LOS: 0 days   Pt seen, examined and agree w A/P as above.  [facility] 07/27/2018, 5:12 PM    Subjective:   No c/o  Objective Vitals:   07/26/18 1735 07/26/18 2132 07/27/18 0435 07/27/18 0913  BP: 124/74 108/63 111/68 (!) 126/106  Pulse: 97 83 85 97  Resp: 16 16 18 18   Temp: 97.9 F (36.6 C) 98.1 F (36.7 C) 98.2 F (36.8 C) 98.6 F (37 C)  TempSrc: Oral Oral Oral Oral  SpO2:  100% 99% 100%  Weight: 56.7 kg     Height: 5' (1.524 m)      Physical Exam General: NAD Heart: RRR Lungs: dry crackles left base Abdomen: soft NT  Extremities: no LE edema right groin VAC Dialysis Access:  PD cath   Additional Objective Labs: Basic Metabolic Panel: Recent Labs  Lab 07/22/18 0347 07/22/18 0949 07/26/18 1652  NA 133* 135 134*  K 3.6 3.6 4.6  CL 96* 97* 96*  CO2 25 26 23   GLUCOSE 169* 129* 183*  BUN 25* 23* 22*  CREATININE 7.84* 7.58* 8.32*  CALCIUM 8.1* 8.0* 8.2*  PHOS 3.7  --   --    Liver Function Tests: Recent Labs  Lab 07/22/18 0347 07/26/18 1652  AST  --  44*  ALT  --  <5  ALKPHOS  --  305*  BILITOT  --  0.8  PROT  --  6.2*  ALBUMIN 1.8* 1.8*   No results for input(s): LIPASE, AMYLASE in the last 168 hours. CBC: Recent Labs  Lab 07/21/18 1614 07/22/18 0347  07/23/18 0302 07/24/18 0303 07/26/18 1333  WBC 27.1* 20.6* 18.0* 15.1* 15.5*  NEUTROABS  --   --   --   --  11.5*  HGB 9.1* 8.8* 9.2* 8.9* 9.0*  HCT 27.7* 27.5* 27.8* 27.2* 28.9*  MCV 87.7 88.1 88.5 88.9 91.2  PLT 336 335 342 335 327   Blood Culture    Component Value Date/Time   SDES ABSCESS 07/19/2018 1127   SPECREQUEST Normal 07/19/2018 1127   CULT ABUNDANT KLEBSIELLA PNEUMONIAE 07/19/2018 1127   REPTSTATUS 07/21/2018 FINAL 07/19/2018 1127    Cardiac Enzymes: No results for input(s): CKTOTAL, CKMB, CKMBINDEX, TROPONINI in the last 168 hours. CBG: Recent Labs  Lab 07/24/18 1124 07/24/18 1733 07/26/18 1626 07/26/18 2132 07/27/18 0718  GLUCAP 83 114* 146* 138* 271*   Iron Studies: No results for input(s): IRON, TIBC, TRANSFERRIN, FERRITIN in the last 72 hours. Lab Results  Component Value Date   INR 1.6 (H) 07/26/2018   INR 1.3 (H) 07/12/2018   INR 1.2 07/07/2018   Studies/Results: Dg Chest 1 View  Result Date: 07/26/2018 CLINICAL DATA:  Weakness. EXAM: CHEST  1 VIEW COMPARISON:  CT scan of Sep 07, 2017. Radiographs of July 10, 2016. FINDINGS: Stable cardiomegaly. Status post coronary artery bypass graft and mitral valve repair. Atherosclerosis of thoracic aorta is noted. No pneumothorax is noted. Right lung is clear. Stable left basilar atelectasis or infiltrate is noted with associated pleural effusion. Bony thorax is unremarkable. IMPRESSION: Stable left basilar atelectasis or infiltrate is noted with associated pleural effusion. Aortic Atherosclerosis (ICD10-I70.0). Electronically Signed   By: Marijo Conception, M.D.   On: 07/26/2018 13:37   Medications: . pantoprazole (PROTONIX) IV 80 mg (07/27/18 0905)   . amitriptyline  10 mg Oral QHS  . amoxicillin-clavulanate  1 tablet Oral Daily  . atorvastatin  10 mg Oral QHS  . calcitRIOL  0.5 mcg Oral QHS  . calcium acetate  2,001 mg Oral TID WC  . gabapentin  100 mg Oral TID  . gentamicin cream  1 application Topical  Daily  . insulin aspart  0-9 Units Subcutaneous TID WC  . insulin NPH Human  10 Units Subcutaneous QHS  . lidocaine (PF)  5 mL Infiltration Once  . lipase/protease/amylase  108,000 Units Oral TID WC  . multivitamin  1 tablet Oral QHS  . potassium chloride SA  20 mEq Oral QHS

## 2018-07-27 NOTE — Progress Notes (Signed)
Inpatient Diabetes Program Recommendations  AACE/ADA: New Consensus Statement on Inpatient Glycemic Control (2015)  Target Ranges:  Prepandial:   less than 140 mg/dL      Peak postprandial:   less than 180 mg/dL (1-2 hours)      Critically ill patients:  140 - 180 mg/dL   Lab Results  Component Value Date   GLUCAP 191 (H) 07/27/2018   HGBA1C 11.0 (H) 06/30/2018    Results for TALASIA, SAULTER (MRN [id number]) as of 07/27/2018 13:38  Ref. Range 07/26/2018 16:26 07/26/2018 21:32 07/27/2018 07:18 07/27/2018 11:49  Glucose-Capillary Latest Ref Range: 70 - 99 mg/dL 146 (H) 138 (H) 271 (H) 191 (H)     Review of Glycemic Control  Diabetes history:Type 1 Diabetes (Type 1 DM - therefore makes no insulin and needs basal, correction, and meal coverage)  Outpatient Diabetes medications: Humalog 1-10 units tid with meals; NPH 10 units at bedtime  Current orders for Inpatient glycemic control: NPH 10 units  q hs                                                                          Novolog (0-9 units) sensitive correction scale tid wc   ESRD on PD.  Per [facility] patient declined to take her NPH last night (3/29) at 2200.  Ordered NPH dose is her current home dose of 10 units (NPH was lowered from 20 units daily to 10 units daily last admission when d/c on 3/26 a few days ago)  .  She is scheduled for today's dose at 2200 last night and I asked RN to please go ahead and discuss with patient what her concern was last night and ask if she has any concerns about it for tonight. If she does I asked her RN to please clarify with MD as patient does need to have her basal insulin as she is type 1. In past notes, patient did have some episodes of hypoglycemia when insulin at higher doses. Regarding meals she just had a diet order placed and is eating very little per RN. When patient is eating >50% of meals will need Novolog meal coverage added tid wc.  -- Will follow during hospitalization.--  Jonna Clark RN, MSN Diabetes Coordinator Inpatient Glycemic Control Team Team Pager: [phone] (8am-5pm)

## 2018-07-27 NOTE — Progress Notes (Signed)
Internal Medicine Attending:   I saw and examined the patient. I reviewed Dr Dorothyann Peng note and I agree with the resident's findings and plan as documented in the resident's note.

## 2018-07-27 NOTE — Progress Notes (Signed)
[facility] Progress Note  Priscilla [name] 48 y.o. 03/29/1971  CC: GI bleed   Subjective: Patient seen and examined at bedside.  She was recently discharged after being treated for upper GI bleed from gastric ulcer.  Yesterday she had an episode of rectal bleeding.  One episode after bowel movement.  Her hemoglobin is stable.  She denies abdominal pain, nausea vomiting.  No bowel movement since admission.  ROS : Negative for chest pain and shortness of breath.   Objective: Vital signs in last 24 hours: Vitals:   07/27/18 0435 07/27/18 0913  BP: 111/68 (!) 126/106  Pulse: 85 97  Resp: 18 18  Temp: 98.2 F (36.8 C) 98.6 F (37 C)  SpO2: 99% 100%    Physical Exam:  General.  Well developed.  Not in acute distress HEENT : NS, AT, extraocular movement intact.  Oral mucosa moist. Heart.  Rate rhythm regular ABD  Soft, nontender, nondistended, bowel sounds present.  No peritoneal signs Neuro.  Alert/oriented x3. Psych.  Mood and affect normal.  Lab Results: Recent Labs    07/26/18 1652  NA 134*  K 4.6  CL 96*  CO2 23  GLUCOSE 183*  BUN 22*  CREATININE 8.32*  CALCIUM 8.2*   Recent Labs    07/26/18 1652  AST 44*  ALT <5  ALKPHOS 305*  BILITOT 0.8  PROT 6.2*  ALBUMIN 1.8*   Recent Labs    07/26/18 1333  WBC 15.5*  NEUTROABS 11.5*  HGB 9.0*  HCT 28.9*  MCV 91.2  PLT 327   Recent Labs    07/26/18 1306  LABPROT 18.9*  INR 1.6*      Assessment/Plan: -Rectal bleeding.  Resolved now.  One episode of maroon color blood after bowel movement. She had normal colonoscopy in January 2020 at [facility] according to patient.  Report not available to review. -Recent upper GI bleed from large gastric ulcer. -Anemia.  Hemoglobin stable -Status post failed renal and pancreatic transplant. -Pancreatic insufficiency. - PVD   Recommendations ------------------------ -No further bleeding episodes. -Start full liquid diet.   Advance as tolerated. -Hold aspirin now.  According to patient, she does not need Plavix anymore. -Continue Protonix drip for today.  Switch to IV twice daily PPI tomorrow. -If evidence of melena or drop in hemoglobin, consider EGD tomorrow. -Monitor H&H.  GI will follow   Otis Brace MD, [place] 07/27/2018, 10:37 AM  Contact #  [phone]

## 2018-07-28 ENCOUNTER — Encounter (HOSPITAL_COMMUNITY): Payer: Self-pay | Admitting: *Deleted

## 2018-07-28 ENCOUNTER — Inpatient Hospital Stay (HOSPITAL_COMMUNITY): Payer: Medicare Other | Admitting: Anesthesiology

## 2018-07-28 ENCOUNTER — Encounter (HOSPITAL_COMMUNITY)
Admission: EM | Disposition: A | Discharge status: Home Health | Payer: Self-pay | Source: Home / Self Care | Attending: Internal Medicine

## 2018-07-28 DIAGNOSIS — K209 Esophagitis, unspecified: Secondary | ICD-10-CM

## 2018-07-28 DIAGNOSIS — R011 Cardiac murmur, unspecified: Secondary | ICD-10-CM

## 2018-07-28 DIAGNOSIS — Z8774 Personal history of (corrected) congenital malformations of heart and circulatory system: Secondary | ICD-10-CM

## 2018-07-28 DIAGNOSIS — Z91018 Allergy to other foods: Secondary | ICD-10-CM

## 2018-07-28 HISTORY — PX: ESOPHAGOGASTRODUODENOSCOPY (EGD) WITH PROPOFOL: SHX5813

## 2018-07-28 LAB — GLUCOSE, CAPILLARY
GLUCOSE-CAPILLARY: 140 mg/dL — AB (ref 70–99)
Glucose-Capillary: 240 mg/dL — ABNORMAL HIGH (ref 70–99)
Glucose-Capillary: 120 mg/dL — ABNORMAL HIGH (ref 70–99)
Glucose-Capillary: 53 mg/dL — ABNORMAL LOW (ref 70–99)
Glucose-Capillary: 41 mg/dL — CL (ref 70–99)
Glucose-Capillary: 50 mg/dL — ABNORMAL LOW (ref 70–99)
Glucose-Capillary: 52 mg/dL — ABNORMAL LOW (ref 70–99)
Glucose-Capillary: 93 mg/dL (ref 70–99)

## 2018-07-28 LAB — CBC
HCT: 26 % — ABNORMAL LOW (ref 36.0–46.0)
Hemoglobin: 8.2 g/dL — ABNORMAL LOW (ref 12.0–15.0)
MCH: 28.4 pg (ref 26.0–34.0)
MCHC: 31.5 g/dL (ref 30.0–36.0)
MCV: 90 fL (ref 80.0–100.0)
Platelets: 247 10*3/uL (ref 150–400)
RBC: 2.89 MIL/uL — ABNORMAL LOW (ref 3.87–5.11)
RDW: 16.7 % — ABNORMAL HIGH (ref 11.5–15.5)
WBC: 10.6 10*3/uL — ABNORMAL HIGH (ref 4.0–10.5)
nRBC: 0.6 % — ABNORMAL HIGH (ref 0.0–0.2)

## 2018-07-28 LAB — BASIC METABOLIC PANEL
Anion gap: 12 (ref 5–15)
BUN: 21 mg/dL — ABNORMAL HIGH (ref 6–20)
CO2: 24 mmol/L (ref 22–32)
Calcium: 8.7 mg/dL — ABNORMAL LOW (ref 8.9–10.3)
Chloride: 99 mmol/L (ref 98–111)
Creatinine, Ser: 8.09 mg/dL — ABNORMAL HIGH (ref 0.44–1.00)
GFR calc Af Amer: 6 mL/min — ABNORMAL LOW (ref >60)
GFR calc non Af Amer: 5 mL/min — ABNORMAL LOW (ref >60)
Glucose, Bld: 238 mg/dL — ABNORMAL HIGH (ref 70–99)
Potassium: 3.6 mmol/L (ref 3.5–5.1)
Sodium: 135 mmol/L (ref 135–145)

## 2018-07-28 SURGERY — ESOPHAGOGASTRODUODENOSCOPY (EGD) WITH PROPOFOL
Anesthesia: Monitor Anesthesia Care

## 2018-07-28 MED ORDER — PROPOFOL 10 MG/ML IV BOLUS
INTRAVENOUS | Status: DC | PRN
  Administered 2018-07-28: 20 mg via INTRAVENOUS
  Administered 2018-07-28: 80 mg via INTRAVENOUS

## 2018-07-28 MED ORDER — SODIUM CHLORIDE 0.9 % IV SOLN
INTRAVENOUS | Status: DC
  Administered 2018-07-28: 10:00:00 via INTRAVENOUS

## 2018-07-28 MED ORDER — SODIUM CHLORIDE 0.9 % IV SOLN
INTRAVENOUS | Status: DC | PRN
  Administered 2018-07-28: 10:00:00 via INTRAVENOUS

## 2018-07-28 MED ORDER — PHENYLEPHRINE 40 MCG/ML (10ML) SYRINGE FOR IV PUSH (FOR BLOOD PRESSURE SUPPORT)
PREFILLED_SYRINGE | INTRAVENOUS | Status: DC | PRN
  Administered 2018-07-28: 80 ug via INTRAVENOUS
  Administered 2018-07-28: 120 ug via INTRAVENOUS

## 2018-07-28 MED ORDER — GLUCOSE 40 % PO GEL
ORAL | Status: AC
  Administered 2018-07-28: 37.5 g
  Filled 2018-07-28: qty 1

## 2018-07-28 MED ORDER — PANTOPRAZOLE SODIUM 40 MG PO TBEC: 40.0000 mg | DELAYED_RELEASE_TABLET | Freq: Two times a day (BID) | ORAL | 0 refills | Status: DC

## 2018-07-28 MED ORDER — LIDOCAINE 2% (20 MG/ML) 5 ML SYRINGE
INTRAMUSCULAR | Status: DC | PRN
  Administered 2018-07-28: 60 mg via INTRAVENOUS

## 2018-07-28 SURGICAL SUPPLY — 15 items

## 2018-07-28 NOTE — Transfer of Care (Signed)
Immediate Anesthesia Transfer of Care Note  Patient: Priscilla [name]  Procedure(s) Performed: ESOPHAGOGASTRODUODENOSCOPY (EGD) WITH PROPOFOL (N/A )  Patient Location: Endoscopy Unit  Anesthesia Type:MAC  Level of Consciousness: drowsy and patient cooperative  Airway & Oxygen Therapy: Patient Spontanous Breathing and Patient connected to nasal cannula oxygen  Post-op Assessment: Report given to RN and Post -op Vital signs reviewed and stable  Post vital signs: Reviewed and stable  Last Vitals:  Vitals Value Taken Time  BP 107/22 07/28/2018 11:05 AM  Temp    Pulse 81 07/28/2018 11:07 AM  Resp 26 07/28/2018 11:07 AM  SpO2 100 % 07/28/2018 11:07 AM  Vitals shown include unvalidated device data.  Last Pain:  Vitals:   07/28/18 1013  TempSrc: Oral  PainSc: 0-No pain      Patients Stated Pain Goal: 0 (44/03/[birth year] 4259)  Complications: No apparent anesthesia complications

## 2018-07-28 NOTE — Op Note (Signed)
[facility] Patient Name: Priscilla [name] Procedure Date : 07/28/2018 MRN: [id number] Attending MD: Otis Brace , MD Date of Birth: February 23, 1971 CSN: [id number] Age: 48 Admit Type: Inpatient Procedure:                Upper GI endoscopy Indications:              Melena, Follow-up of acute gastric ulcer Providers:                Otis Brace, MD, Zenon Mayo, RN, Cletis Athens, Technician, Ladona Ridgel, Technician Referring MD:              Medicines:                Sedation Administered by an Anesthesia Professional Complications:            No immediate complications. Estimated Blood Loss:      Procedure:                Pre-Anesthesia Assessment:                           - Prior to the procedure, a History and Physical                            was performed, and patient medications and                            allergies were reviewed. The patient's tolerance of                            previous anesthesia was also reviewed. The risks                            and benefits of the procedure and the sedation                            options and risks were discussed with the patient.                            All questions were answered, and informed consent                            was obtained. Prior Anticoagulants: The patient has                            taken no previous anticoagulant or antiplatelet                            agents except for aspirin. ASA Grade Assessment:                            III - A patient with severe systemic disease. After  reviewing the risks and benefits, the patient was                            deemed in satisfactory condition to undergo the                            procedure.                           After obtaining informed consent, the endoscope was                            passed under direct vision. Throughout the                             procedure, the patient's blood pressure, pulse, and                            oxygen saturations were monitored continuously. The                            [id number]-H190 ([id number] Olympus gastroscope was                            introduced through the mouth, and advanced to the                            second part of duodenum. The upper GI endoscopy was                            performed with moderate difficulty due to presence                            of food. The patient tolerated the procedure well. Scope In: Scope Out: Findings:      Mildly severe esophagitis with no bleeding was found in the distal       esophagus.      Healing gastric ulcers were found at the proximal body of the stomach.       There was evidence of retained food mixed with old clotted blood at this       area. Not able to clear retained material completely but there was no       evidence of active bleeding.      The duodenal bulb, first portion of the duodenum and second portion of       the duodenum were normal. Impression:               - Mildly severe esophagitis.                           - Non-bleeding gastric ulcers with no stigmata of                            bleeding.                           -  Normal duodenal bulb, first portion of the                            duodenum and second portion of the duodenum.                           - No specimens collected. Recommendation:           - Return patient to hospital ward for ongoing care.                           - Resume previous diet.                           - Continue present medications.                           - Repeat upper endoscopy in 2 months to check                            healing.                           - Return to GI office in 6 weeks. Procedure Code(s):        --- Professional ---                           [phone], Esophagogastroduodenoscopy, flexible,                            transoral; diagnostic, including collection of                             specimen(s) by brushing or washing, when performed                            (separate procedure) Diagnosis Code(s):        --- Professional ---                           K20.9, Esophagitis, unspecified                           K25.9, Gastric ulcer, unspecified as acute or                            chronic, without hemorrhage or perforation                           K92.1, Melena (includes Hematochezia)                           K25.3, Acute gastric ulcer without hemorrhage or                            perforation CPT copyright 2019 American Medical Association. All rights reserved. The codes documented in this report are preliminary and  upon coder review may  be revised to meet current compliance requirements. Otis Brace, MD Otis Brace, MD 07/28/2018 11:10:03 AM Number of Addenda: 0

## 2018-07-28 NOTE — Discharge Summary (Signed)
Name: Priscilla [name] MRN: [id number] DOB: 1970/06/05 48 y.o. PCP: [name], MD  Date of Admission: 07/26/2018 12:01 PM Date of Discharge: 07/28/18 Attending Physician: Lucious Groves, DO  Discharge Diagnosis: 1. GI bleed 2. Right groin AVF infection 3. ESRD on PD 4. DM type 1 5. Pancreatic insufficiency 6. HLD 7. Hx of Ischemic cardiomyopathy, severe MR s/p mitral valve repair, PFO s/p closure, HTN  Discharge Medications: Allergies as of 07/28/2018      Reactions   Pork-derived Products Other (See Comments)   Pt is [place]      Medication List    STOP taking these medications   amoxicillin-clavulanate 500-125 MG tablet Commonly known as:  AUGMENTIN     TAKE these medications   acetaminophen 325 MG tablet Commonly known as:  TYLENOL Take 650 mg by mouth every 4 (four) hours as needed for mild pain.   amitriptyline 10 MG tablet Commonly known as:  ELAVIL Take 10 mg by mouth at bedtime.   aspirin 81 MG chewable tablet Chew 1 tablet (81 mg total) by mouth daily.   atorvastatin 10 MG tablet Commonly known as:  LIPITOR Take 10 mg by mouth at bedtime.   calcitRIOL 0.5 MCG capsule Commonly known as:  ROCALTROL Take 0.5 mcg by mouth at bedtime.   calcium acetate 667 MG capsule Commonly known as:  PHOSLO Take 2,001 mg by mouth See admin instructions. Take 3 capsules (2001 mg) by mouth three times daily with meals, take 2 capsules (1334 mg) with snacks   Creon 36000 UNITS Cpep capsule Generic drug:  lipase/protease/amylase Take 72,000-108,000 Units by mouth See admin instructions. Take 3 capsules (108,000 units) by mouth three times daily with meals, take 2 capsules (72,000 units) with snacks   gabapentin 100 MG capsule Commonly known as:  NEURONTIN Take 100 mg by mouth 3 (three) times daily.   gentamicin cream 0.1 % Commonly known as:  GARAMYCIN Apply 1 application topically See admin instructions. Apply topically to dialysis site daily after  showering   HYDROmorphone 2 MG tablet Commonly known as:  DILAUDID Take 1 tablet (2 mg total) by mouth every 6 (six) hours as needed for moderate pain or severe pain.   insulin lispro 100 UNIT/ML injection Commonly known as:  HUMALOG Inject 1-10 Units into the skin 3 (three) times daily before meals. Per sliding scale   insulin NPH Human 100 UNIT/ML injection Commonly known as:  HUMULIN N,NOVOLIN N Inject 0.1 mLs (10 Units total) into the skin at bedtime.   levonorgestrel 20 MCG/24HR IUD Commonly known as:  MIRENA 1 each by Intrauterine route once. Implanted March 2018   loperamide 2 MG capsule Commonly known as:  IMODIUM Take 2 mg by mouth See admin instructions. Take one capsule (2 mg) by mouth with every dose of Creon   multivitamin Tabs tablet Take 1 tablet by mouth at bedtime.   pantoprazole 40 MG tablet Commonly known as:  PROTONIX Take 1 tablet (40 mg total) by mouth 2 (two) times daily.   potassium chloride SA 20 MEQ tablet Commonly known as:  K-DUR,KLOR-CON Take 1 tablet (20 mEq total) by mouth daily. What changed:  when to take this   Travoprost (BAK Free) 0.004 % Soln ophthalmic solution Commonly known as:  TRAVATAN Place 1 drop into both eyes at bedtime.       Disposition and follow-up:   Priscilla [name] was discharged from [facility] in Stable condition.  At the hospital follow up visit please address:  1.    GI Bleed -Avoid NSAIDS -Follow up with GI in 6 weeks at [facility] -repeat upper endoscopy in 2 months to check for healing ulcer  -Continue ppi bid for 3 months   Right AVF groin infection  -Monitor for fever and leukocytosis -Make sure she is getting home health  2.  Labs / imaging needed at time of follow-up: CBC, BMP  3.  Pending labs/ test needing follow-up: None  Follow-up Appointments: Follow-up Information    Asencion Noble, MD Follow up.   Specialty:  Internal Medicine Why:  Please  go to appointment on April 7th at 10:45.  Contact information: [address] [place] [address] [place], [place]. Schedule an appointment as soon as possible for a visit.   Why:  Please make an appointment with your gastroenterologist in 6 weeks, you will need a repeat EGD in the future.  Contact information: [address] [place] [address] [phone]           Hospital Course by problem list:  GI Bleed This is a 48 year old female with a history ofendstage renal disease status post renal transplant failure onperitoneal dialysis, diabetes mellitus type 1, pancreatic insufficiency status post pancreas transplant, PVD status post bilateral iliac stents, ischemic cardiomyopathy, mitral regurgitation status post repair,CABG x2, hypertension who was recently admitted for a UGIB 2/2 bleeding gastric ulcerrequiring ICU care with pressors, after discussion about risks and benefits of restarting her anticogulation she was more inclined to continue it to improve her vasculature,plavix was discontinuedand aspirin was continued at that time. Shepresented to theEDwith generalized weakness, and hematochezia. Virals were relatively normal, leukocytosis of 15 (improved from discharge), and Hgb was stable at 9.EGD done 07/28/18 showed healing gastric ulcer in proximal body of stomach with mild distal esophagitis. Melena may be related to her prior GI bleeding with residual blood in the intestines. Her Hgb remained stable while admitted. Vitals were relatively stable. She had her ASA resumed and she was continued on PPI BID. Follow up with primary GI in 6 weeks, with repeat endoscopy in 2 months.   Right AVF groin infection  Patient developed a right groin avf infection around the incision of her bilateral iliac stents. She had surgical repair and wound vac placed. She received a total of 7 days of Augmentin which she completed here. Wound care was  involved and her wound vac was exchanged. Has home health arranged from previous admission.   ESRD on PD: Nephrology was following, she was continued on her home PD nightly.  Hx of pancreatic insufficiency, s/p failed pancreatic transplant: Creon was continued inpatient.  PVD: ASA was held initially due to GI bleed, restarted on discharge.  DM type 1: Resumed 10 units humalin on discharge. Should be getting the insulin pump soon.  Hx of Ischemic cardiomyopathy, severe MR s/p mitral valve repair, PFO s/p closure, HTN: Her home coreg, losartan, and clonidine was discontinued on her last admission. Nothing was restarted on discharge.   Discharge Vitals:   BP 124/61 (BP Location: Right Arm)    Pulse 78    Temp 98 F (36.7 C) (Oral)    Resp 18    Ht 5' (1.524 m)    Wt 56.2 kg    SpO2 100%    BMI 24.20 kg/m   Pertinent Labs, Studies, and Procedures:   CBC Latest Ref Rng & Units 07/28/2018 07/27/2018 07/26/2018  WBC 4.0 - 10.5 K/uL  10.6(H) 13.1(H) 15.5(H)  Hemoglobin 12.0 - 15.0 g/dL 8.2(L) 8.3(L) 9.0(L)  Hematocrit 36.0 - 46.0 % 26.0(L) 25.3(L) 28.9(L)  Platelets 150 - 400 K/uL 247 278 327   BMP Latest Ref Rng & Units 07/28/2018 07/26/2018 07/22/2018  Glucose 70 - 99 mg/dL 238(H) 183(H) 129(H)  BUN 6 - 20 mg/dL 21(H) 22(H) 23(H)  Creatinine 0.44 - 1.00 mg/dL 8.09(H) 8.32(H) 7.58(H)  Sodium 135 - 145 mmol/L 135 134(L) 135  Potassium 3.5 - 5.1 mmol/L 3.6 4.6 3.6  Chloride 98 - 111 mmol/L 99 96(L) 97(L)  CO2 22 - 32 mmol/L 24 23 26   Calcium 8.9 - 10.3 mg/dL 8.7(L) 8.2(L) 8.0(L)   EGD 07/28/18: - Mildly severe esophagitis. - Non-bleeding gastric ulcers with no stigmata of bleeding. - Normal duodenal bulb, first portion of the duodenum and second portion of the duodenum. - No specimens collected.  Discharge Instructions: Discharge Instructions    Call MD for:  difficulty breathing, headache or visual disturbances   Complete by:  As directed    Call MD for:  extreme fatigue   Complete by:  As  directed    Call MD for:  hives   Complete by:  As directed    Call MD for:  persistant dizziness or light-headedness   Complete by:  As directed    Call MD for:  persistant nausea and vomiting   Complete by:  As directed    Call MD for:  redness, tenderness, or signs of infection (pain, swelling, redness, odor or green/yellow discharge around incision site)   Complete by:  As directed    Call MD for:  severe uncontrolled pain   Complete by:  As directed    Call MD for:  temperature >100.4   Complete by:  As directed    Diet - low sodium heart healthy   Complete by:  As directed    Discharge instructions   Complete by:  As directed    Roann [name],   It has been a pleasure working with you and we are glad you're feeling better. You were hospitalized for dark stools. You had an EGD that did not show any new bleeding areas. We think that this may be related to your previous bleeding. You can continue your aspirin for now. Please follow up with the gastroenterologist in 6 weeks.    Please continue taking the protonix 40 mg twice a day.  You can continue your aspirin daily, but please avoid any extra NSAID use such as ibuprofen.   You can continue to drive but please do not drive after taking your dilaudid or if you are feeling light headed or dizzy.    Follow up with your primary care provider, we have set up an appointment with our clinic for next week. Please contact us if you have any conflicts with this.   If your symptoms worsen or you develop new symptoms, please seek medical help whether it is your primary care provider or emergency department.  If you have any questions about this hospitalization please call [phone].   Increase activity slowly   Complete by:  As directed       Signed: Asencion Noble, MD 07/28/2018, 2:03 PM   Pager: Pager: [phone]

## 2018-07-28 NOTE — TOC Transition Note (Addendum)
Transition of Care [facility]) - CM/SW Discharge Note   Patient Details  Name: Priscilla [name] MRN: [id number] Date of Birth: [birth year]  Transition of [facility] CM/SW Contact:  Bartholomew Crews, RN Phone Number: [phone] 07/28/2018, 2:44 PM   Clinical Narrative:    Patient to transition home today. [facility] following for RN and PT. Spoke with MD about [facility] - RN, PT for resumption of orders. [facility] aware of patient transition home.   Spoke with patient at bedside to ensure she had all her medications. Patient stated that her protonix and baby aspirin had been called in to [facility]. She stated that her sister had notified [facility] of her transition home. Patient states that she has transportation home - expresses no needs and ready to be home.   No other transition of care needs identified.   Final next level of care: [place] Barriers to Discharge: No Barriers Identified   Patient Goals and CMS Choice        Discharge Placement     Home                  Discharge Plan and Services        [facility] for RN, PT                  Social Determinants of Health (SDOH) Interventions     Readmission Risk Interventions Readmission Risk Prevention Plan 07/17/2018  Transportation Screening Complete  PCP or Specialist Appt within 3-5 Days Not Complete  Not Complete comments Patient is being managed by Nephrology for outpatient appointment for ESRD  HRI or Home Care Consult Complete  Social Work Consult for [place] Planning/Counseling Not Complete  SW consult not completed comments Not appropriate; patient verbalized being independent and declined any additional needs  Palliative Care Screening Not Applicable  Medication Review Press photographer) Complete  Some recent data might be hidden

## 2018-07-28 NOTE — Anesthesia Postprocedure Evaluation (Signed)
Anesthesia Post Note  Patient: Ziyonna [name]  Procedure(s) Performed: ESOPHAGOGASTRODUODENOSCOPY (EGD) WITH PROPOFOL (N/A )     Patient location during evaluation: Endoscopy Anesthesia Type: MAC Level of consciousness: awake and alert Pain management: pain level controlled Vital Signs Assessment: post-procedure vital signs reviewed and stable Respiratory status: spontaneous breathing, nonlabored ventilation and respiratory function stable Cardiovascular status: stable and blood pressure returned to baseline Postop Assessment: no apparent nausea or vomiting Anesthetic complications: no    Last Vitals:  Vitals:   07/28/18 1120 07/28/18 1158  BP: (!) 112/39 124/61  Pulse: 80 78  Resp: 14 18  Temp:  36.7 C  SpO2: 100% 100%    Last Pain:  Vitals:   07/28/18 1158  TempSrc: Oral  PainSc:                  Catalina Gravel

## 2018-07-28 NOTE — Brief Op Note (Signed)
07/26/2018 - 07/28/2018  11:10 AM  PATIENT:  Priscilla [name]  48 y.o. female  PRE-OPERATIVE DIAGNOSIS:  Melena  POST-OPERATIVE DIAGNOSIS:  esophagitis, gastric ulcer, hard to visualize due to residual food in stomach  PROCEDURE:  Procedure(s): ESOPHAGOGASTRODUODENOSCOPY (EGD) WITH PROPOFOL (N/A)  SURGEON:  Surgeon(s) and Role:    * Jeane Cashatt, MD - Primary  Findings ------------ -  Healing gastric ulcer was found at  the proximal body of the stomach.  There was evidence of retained food mixed with old clotted blood at this area.  Not able to clear retained material completely but there was no evidence of active bleeding.  Mild distal esophagitis  Recommendations ----------------------------- -Advance diet -Continue twice daily PPI for at least the next 2 to 3 months. - avoid NSAIDs  -Repeat EGD in 2 to 3 months with primary gastroenterologist at [facility] with primary GI in 6 weeks. - GI will sign off.  Call us back if needed  Otis Brace MD, [place] 07/28/2018, 11:12 AM  Contact #  [phone]

## 2018-07-28 NOTE — Anesthesia Preprocedure Evaluation (Addendum)
Anesthesia Evaluation  Patient identified by MRN, date of birth, ID band Patient awake  General Assessment Comment:Beta blocker held for hypotension  Reviewed: Allergy & Precautions, NPO status , Patient's Chart, lab work & pertinent test results, reviewed documented beta blocker date and time   Airway Mallampati: II  TM Distance: >3 FB Neck ROM: Full    Dental  (+) Teeth Intact, Missing   Pulmonary neg pulmonary ROS,    breath sounds clear to auscultation       Cardiovascular hypertension, Pt. on medications and Pt. on home beta blockers + CAD, + Past MI, + CABG, + Peripheral Vascular Disease and +CHF  + Valvular Problems/Murmurs  Rhythm:Regular Rate:Normal     Neuro/Psych negative neurological ROS  negative psych ROS   GI/Hepatic GERD  Controlled,H/o pancreas transplant   Endo/Other  diabetes  Renal/GU ESRF and DialysisRenal disease     Musculoskeletal   Abdominal   Peds  Hematology  (+) Blood dyscrasia, anemia , leukocytosis   Anesthesia Other Findings S/p CABG/MVR Peritoneal dialysis last night  Reproductive/Obstetrics negative OB ROS                             Anesthesia Physical  Anesthesia Plan  ASA: III  Anesthesia Plan: MAC   Post-op Pain Management:    Induction: Intravenous  PONV Risk Score and Plan: 2 and Ondansetron, Propofol infusion and Treatment may vary due to age or medical condition  Airway Management Planned: Natural Airway and Nasal Cannula  Additional Equipment: None  Intra-op Plan:   Post-operative Plan:   Informed Consent: I have reviewed the patients History and Physical, chart, labs and discussed the procedure including the risks, benefits and alternatives for the proposed anesthesia with the patient or authorized representative who has indicated his/her understanding and acceptance.     Dental advisory given  Plan Discussed with: CRNA and  Surgeon  Anesthesia Plan Comments:         Anesthesia Quick Evaluation                                  Anesthesia Evaluation  Patient identified by MRN, date of birth, ID band Patient awake    Reviewed: Allergy & Precautions, NPO status , Patient's Chart, lab work & pertinent test results, reviewed documented beta blocker date and time   Airway Mallampati: II  TM Distance: >3 FB Neck ROM: Full    Dental  (+) Dental Advisory Given, Missing   Pulmonary neg pulmonary ROS,    Pulmonary exam normal breath sounds clear to auscultation       Cardiovascular hypertension, Pt. on home beta blockers and Pt. on medications + CAD, + Past MI, + CABG and +CHF  Normal cardiovascular exam+ Valvular Problems/Murmurs (s/p MVR)  Rhythm:Regular Rate:Normal     Neuro/Psych negative neurological ROS     GI/Hepatic Neg liver ROS, GERD  ,GI Bleed   Endo/Other  diabetes, Insulin DependentS/p pancreas transplant   Renal/GU ESRF and DialysisRenal diseaseS/p kidney transplant-failed K+ 3.0     Musculoskeletal negative musculoskeletal ROS (+)   Abdominal   Peds  Hematology  (+) Blood dyscrasia, anemia ,   Anesthesia Other Findings Day of surgery medications reviewed with the patient.  Reproductive/Obstetrics  Anesthesia Physical Anesthesia Plan  ASA: IV and emergent  Anesthesia Plan: General   Post-op Pain Management:    Induction: Intravenous, Rapid sequence and Cricoid pressure planned  PONV Risk Score and Plan: 3 and Dexamethasone and Ondansetron  Airway Management Planned: Oral ETT  Additional Equipment:   Intra-op Plan:   Post-operative Plan: Extubation in OR  Informed Consent: I have reviewed the patients History and Physical, chart, labs and discussed the procedure including the risks, benefits and alternatives for the proposed anesthesia with the patient or authorized representative who has  indicated his/her understanding and acceptance.     Dental advisory given  Plan Discussed with: CRNA  Anesthesia Plan Comments:         Anesthesia Quick Evaluation

## 2018-07-28 NOTE — Progress Notes (Addendum)
Subjective: Priscilla [name] reports that she was doing well this morning. No acute events overnight. She reports that she has had 2 dark colored stools today. She reported that Dr. Alessandra Bevels had just seen her and they had discussed the option of repeating an EGD today, she was interested in doing that and she is scheduled for an EGD today. She denied any nausea, vomiting, fevers, chills, or abdominal pain. She reported that she did not want to take the insulin due to her not eating a lot and being NPO for a short period of time yesterday, we discussed that her blood sugars have been a little elevated but that since she will be going for an EGD today we can be less aggressive with her sugars. All questions were answered.   Objective:  Vital signs in last 24 hours: Vitals:   07/27/18 1700 07/27/18 2050 07/28/18 0513 07/28/18 0824  BP: (!) 139/114 (!) 105/54 (!) 125/42 (!) 119/55  Pulse: 82 86 83 80  Resp: 18 18 18 18   Temp: 98.4 F (36.9 C) 98.6 F (37 C) 98.5 F (36.9 C) 98.3 F (36.8 C)  TempSrc: Oral Oral Oral Oral  SpO2: 94% 100% 99% 98%  Weight: 56.2 kg 56.2 kg    Height:        General: Well appearing, NAD, sitting in bed Cardiac: Systolic murmur, RRR Pulmonary: CTABL, no wheezing or rhonchi Abdomen: Soft, non-tender, non-distended Extremity:Right groin wound vac in place, no erythema or edema around this area Psychiatry: Normal mood and affect    Assessment/Plan:  Active Problems:   Lower GI bleed   Rectal bleeding   Weakness   ESRD on peritoneal dialysis (HCC)   PVD (peripheral vascular disease) ([place])  This is a 48 year old female with a history of end stage renal disease status post renal transplant failure on peritoneal dialysis, diabetes mellitus type 1, pancreatic insufficiency status post pancreas transplant, PVD status post bilateral iliac stents, ischemic cardiomyopathy, mitral regurgitation status post repair, CABG x2, hypertension who was recently admitted for a  UGIB 2/2 bleeding gastric ulcer requiring ICU care with pressors, after discussion about risks and benefits of restarting her anticogulation she was more inclined to continue it to improve her vasculature, plavix was discontinued and aspirin was continued at that time. She presented to the ED with generalized weakness, and hematochezia. Virals were relatively normal, leukocytosis of 15 (improved from discharge), and Hgb was stable at 9.    GI bleed: This seems to be more consistent with an upper GI bleed given the more maroon colored stools that she is reporting. She has a history of recent gastric ulcers. She had a normal colonoscopy in Jan 2020 per patient.  Today her vitals have been stable, Hgb has been stable at 8.2. She continues to endorse dark stools. We have continued her PPI and are still holding her ASA. GI had already seen her this morning, after discussing conservative management vs endoscopy the patient wanted to have a repeat endoscopy to assess for recurrence of bleeding.  -Gastroenterology following, appreciate recommendations -Continue PPI BID -CBC daily -Transfuse for <8 -Continue to hold ASA, will need to assess restarting with the patient  Right groin AVF infection s/p bilateral iliac stents: Patient reports that the pain medications have been helping, she is currently on Dilaudid every 6 as needed.   Finished 7 day course of augmentin. Currently has the wound VAC in place, no signs of infection at this time. Wound care came by and changed it yesterday.  -  Holding ASA -Conitnue wound vac -Continue wound care -Continue dilaudid 2 mg q6 hr PRN  ESRD on PD: -She has been doing her home PT at night, she denies any issues. Nephrology following, appreciate recommendations -Continue PD at night -Daily BMP  DM1: -Advised her that her CBGs have been elevated, she reported that she was worried that her blood sugars would drop due to her not eating as much. We discussed that she  is on a sensitive sliding scale.  -Continue 10 units Humalin nightly -SSI-sensitive -Continue home gabapentin 100 mg TID  Pancreatic insufficiency: Continue home creon HLD: Continue home lipitor Insomnia: Continue home amitriptyline  FEN: No fluids, replete lytes prn, NPO, then CLD  VTE ppx: SCDs Code Status: FULL    Dispo: Anticipated discharge is pending clinical improvement.   Asencion Noble, MD 07/28/2018, 10:11 AM Pager: [phone]

## 2018-07-28 NOTE — Discharge Instructions (Signed)
Priscilla [name],   It has been a pleasure working with you and we are glad you're feeling better. You were hospitalized for dark stools. You had an EGD that did not show any new bleeding areas. We think that this may be related to your previous bleeding. You can continue your aspirin for now. Please follow up with the gastroenterologist in 6 weeks.     Please continue taking the protonix 40 mg twice a day.  You can continue your aspirin daily, but please avoid any extra NSAID use such as ibuprofen.  You can continue to drive but please do not drive after taking your dilaudid or if you are feeling light headed or dizzy.    Follow up with your primary care provider, we have set up an appointment with our clinic for next week. Please contact us if you have any conflicts with this.   If your symptoms worsen or you develop new symptoms, please seek medical help whether it is your primary care provider or emergency department.  If you have any questions about this hospitalization please call [phone].

## 2018-07-28 NOTE — Anesthesia Procedure Notes (Signed)
Procedure Name: MAC Date/Time: 07/28/2018 10:52 AM Performed by: Orlie Dakin, CRNA Pre-anesthesia Checklist: Patient identified, Emergency Drugs available, Suction available and Patient being monitored Patient Re-evaluated:Patient Re-evaluated prior to induction Oxygen Delivery Method: Nasal cannula Preoxygenation: Pre-oxygenation with 100% oxygen Induction Type: IV induction

## 2018-07-28 NOTE — Progress Notes (Signed)
[facility] Progress Note  Priscilla [name] 48 y.o. Aug 30, 1970  CC: GI bleed   Subjective: Patient seen and examined at bedside.  Patient is now having black tarry stools.  Denied any further bright red blood per rectum.  Denies abdominal pain, nausea or vomiting.  ROS : Negative for chest pain and shortness of breath.   Objective: Vital signs in last 24 hours: Vitals:   07/27/18 2050 07/28/18 0513  BP: (!) 105/54 (!) 125/42  Pulse: 86 83  Resp: 18 18  Temp: 98.6 F (37 C) 98.5 F (36.9 C)  SpO2: 100% 99%    Physical Exam:  General.  Well developed.  Not in acute distress Heart.  Rate rhythm regular ABD  Soft, nontender, nondistended, bowel sounds present.  No peritoneal signs Neuro.  Alert/oriented x3. Psych.  Mood and affect normal.  Lab Results: Recent Labs    07/26/18 1652 07/28/18 0514  NA 134* 135  K 4.6 3.6  CL 96* 99  CO2 23 24  GLUCOSE 183* 238*  BUN 22* 21*  CREATININE 8.32* 8.09*  CALCIUM 8.2* 8.7*   Recent Labs    07/26/18 1652  AST 44*  ALT <5  ALKPHOS 305*  BILITOT 0.8  PROT 6.2*  ALBUMIN 1.8*   Recent Labs    07/26/18 1333 07/27/18 1106 07/28/18 0514  WBC 15.5* 13.1* 10.6*  NEUTROABS 11.5*  --   --   HGB 9.0* 8.3* 8.2*  HCT 28.9* 25.3* 26.0*  MCV 91.2 90.0 90.0  PLT 327 278 247   Recent Labs    07/26/18 1306  LABPROT 18.9*  INR 1.6*      Assessment/Plan: -Rectal bleeding.  Resolved now.  One episode of maroon color blood after bowel movement. She had normal colonoscopy in January 2020 at [facility] according to patient.  Report not available to review. -Recent upper GI bleed from large gastric ulcer.  Now again with black tarry stools. -Anemia.  Hemoglobin stable -Status post failed renal and pancreatic transplant. -Pancreatic insufficiency. - PVD   Recommendations ------------------------ -Now having black tarry stools.  2 episodes today.  Her hemoglobin is stable.  -Conservative  management with serial H&H and continuation of IV PPI versus repeating endoscopy discussed.  Patient prefers repeating endoscopy  -Plan for repeat EGD today.  She is n.p.o.  Risks (bleeding, infection, bowel perforation that could require surgery, sedation-related changes in cardiopulmonary systems), benefits (identification and possible treatment of source of symptoms, exclusion of certain causes of symptoms), and alternatives (watchful waiting, radiographic imaging studies, empiric medical treatment)  were explained to patient/family in detail and patient wishes to proceed.   Otis Brace MD, [place] 07/28/2018, 9:00 AM  Contact #  [phone]

## 2018-07-28 NOTE — Progress Notes (Signed)
[facility] Progress Note   Dialysis Orders: PD [facility] EDW 54 kg 6 total exchanges- 5 on the cycler 2 L fill vol 1 hr 30 min dwell time and 1 pause of 1500 mL, 4 hrs, dry day  Assessment/Plan: 1. GIB - hgb stable- re-scoped this am  - ulcer healing BID PPI per GI 2. ESRD -CCPD - K 3.6 - continue home dialysis  3. Anemia - hgb 8.2 - slight trend down  partial course of IV Fe last admission - ARanesp 100 3/27 - will finish course ferrlicit 154 x 2 while here 4. Secondary hyperparathyroidism - resume daily calcitriol/binders 5. HTN/volume - net UF 1152/volume ok  6. Nutrition - alb 1.8 ongoing challenge -protein encouraged 7. Right groin wound  - WVC 10.6 K stable on Augmentin  8.  DM - BS per primary  [name], PA-C [place] [phone] 07/28/2018,12:54 PM  LOS: 1 day   Pt seen, examined and agree w A/P as above.  [facility] 07/28/2018, 5:30 PM    Subjective:   No c/o. Told she would be d/c today.  Objective Vitals:   07/28/18 1110 07/28/18 1115 07/28/18 1120 07/28/18 1158  BP: (!) 107/32 (!) 104/30 (!) 112/39 124/61  Pulse: 81 81 80 78  Resp: (!) 24 (!) 26 14 18   Temp:    98 F (36.7 C)  TempSrc:    Oral  SpO2: 100% 100% 100% 100%  Weight:      Height:       Physical Exam General: NAD Heart: RRR Lungs: CTA bilaterally Abdomen: soft NT  Extremities: no LE edema right groin VAC Dialysis Access:  PD cath   Additional Objective Labs: Basic Metabolic Panel: Recent Labs  Lab 07/22/18 0347 07/22/18 0949 07/26/18 1652 07/28/18 0514  NA 133* 135 134* 135  K 3.6 3.6 4.6 3.6  CL 96* 97* 96* 99  CO2 25 26 23 24   GLUCOSE 169* 129* 183* 238*  BUN 25* 23* 22* 21*  CREATININE 7.84* 7.58* 8.32* 8.09*  CALCIUM 8.1* 8.0* 8.2* 8.7*  PHOS 3.7  --   --   --    Liver Function Tests: Recent Labs  Lab 07/22/18 0347 07/26/18 1652  AST  --  44*  ALT  --  <5  ALKPHOS  --  305*  BILITOT  --  0.8  PROT   --  6.2*  ALBUMIN 1.8* 1.8*   No results for input(s): LIPASE, AMYLASE in the last 168 hours. CBC: Recent Labs  Lab 07/23/18 0302 07/24/18 0303 07/26/18 1333 07/27/18 1106 07/28/18 0514  WBC 18.0* 15.1* 15.5* 13.1* 10.6*  NEUTROABS  --   --  11.5*  --   --   HGB 9.2* 8.9* 9.0* 8.3* 8.2*  HCT 27.8* 27.2* 28.9* 25.3* 26.0*  MCV 88.5 88.9 91.2 90.0 90.0  PLT 342 335 327 278 247   Blood Culture    Component Value Date/Time   SDES ABSCESS 07/19/2018 1127   SPECREQUEST Normal 07/19/2018 1127   CULT ABUNDANT KLEBSIELLA PNEUMONIAE 07/19/2018 1127   REPTSTATUS 07/21/2018 FINAL 07/19/2018 1127    Cardiac Enzymes: No results for input(s): CKTOTAL, CKMB, CKMBINDEX, TROPONINI in the last 168 hours. CBG: Recent Labs  Lab 07/27/18 1615 07/27/18 2049 07/28/18 0641 07/28/18 1020 07/28/18 1138  GLUCAP 125* 179* 240* 140* 120*   Iron Studies: No results for input(s): IRON, TIBC, TRANSFERRIN, FERRITIN in the last 72 hours. Lab Results  Component Value Date   INR  1.6 (H) 07/26/2018   INR 1.3 (H) 07/12/2018   INR 1.2 07/07/2018   Studies/Results: Dg Chest 1 View  Result Date: 07/26/2018 CLINICAL DATA:  Weakness. EXAM: CHEST  1 VIEW COMPARISON:  CT scan of Sep 07, 2017. Radiographs of July 10, 2016. FINDINGS: Stable cardiomegaly. Status post coronary artery bypass graft and mitral valve repair. Atherosclerosis of thoracic aorta is noted. No pneumothorax is noted. Right lung is clear. Stable left basilar atelectasis or infiltrate is noted with associated pleural effusion. Bony thorax is unremarkable. IMPRESSION: Stable left basilar atelectasis or infiltrate is noted with associated pleural effusion. Aortic Atherosclerosis (ICD10-I70.0). Electronically Signed   By: Marijo Conception, M.D.   On: 07/26/2018 13:37   Medications: . ferric gluconate (FERRLECIT/NULECIT) IV 250 mg (07/28/18 1157)   . amitriptyline  10 mg Oral QHS  . atorvastatin  10 mg Oral QHS  . calcitRIOL  0.5 mcg Oral QHS   . calcium acetate  2,001 mg Oral TID WC  . gabapentin  100 mg Oral TID  . gentamicin cream  1 application Topical Daily  . insulin aspart  0-9 Units Subcutaneous TID WC  . insulin NPH Human  10 Units Subcutaneous QHS  . latanoprost  1 drop Both Eyes QHS  . lidocaine (PF)  5 mL Infiltration Once  . lipase/protease/amylase  108,000 Units Oral TID WC  . multivitamin  1 tablet Oral QHS  . pantoprazole (PROTONIX) IV  40 mg Intravenous Q12H

## 2018-07-28 NOTE — Progress Notes (Addendum)
Patient CBG prior to discharge was 51. Patient remains alert and oriented x 4. Patient treated with snack and glucose gel. CBG = 93 at discharge. Patient did not eat any lunch. Patient discharged to home with sister. Patient paced back on the portable wound vac. Dorthey Sawyer, RN

## 2018-07-29 ENCOUNTER — Encounter: Payer: Self-pay | Admitting: Vascular Surgery

## 2018-07-29 ENCOUNTER — Encounter (HOSPITAL_COMMUNITY): Payer: Self-pay | Admitting: Gastroenterology

## 2018-07-31 ENCOUNTER — Emergency Department (HOSPITAL_COMMUNITY): Payer: Medicare Other

## 2018-07-31 ENCOUNTER — Encounter (HOSPITAL_COMMUNITY): Payer: Self-pay

## 2018-07-31 ENCOUNTER — Other Ambulatory Visit: Payer: Self-pay

## 2018-07-31 ENCOUNTER — Inpatient Hospital Stay (HOSPITAL_COMMUNITY)
Admission: EM | Admit: 2018-07-31 | Discharge: 2018-08-02 | DRG: 377 | Disposition: A | Discharge status: Home Health | Payer: Medicare Other | Attending: Internal Medicine | Admitting: Internal Medicine

## 2018-07-31 DIAGNOSIS — T8612 Kidney transplant failure: Secondary | ICD-10-CM | POA: Diagnosis present

## 2018-07-31 DIAGNOSIS — I252 Old myocardial infarction: Secondary | ICD-10-CM | POA: Diagnosis not present

## 2018-07-31 DIAGNOSIS — I959 Hypotension, unspecified: Secondary | ICD-10-CM | POA: Diagnosis present

## 2018-07-31 DIAGNOSIS — Z823 Family history of stroke: Secondary | ICD-10-CM | POA: Diagnosis not present

## 2018-07-31 DIAGNOSIS — K8689 Other specified diseases of pancreas: Secondary | ICD-10-CM | POA: Diagnosis not present

## 2018-07-31 DIAGNOSIS — N186 End stage renal disease: Secondary | ICD-10-CM | POA: Diagnosis present

## 2018-07-31 DIAGNOSIS — Z8249 Family history of ischemic heart disease and other diseases of the circulatory system: Secondary | ICD-10-CM | POA: Diagnosis not present

## 2018-07-31 DIAGNOSIS — I120 Hypertensive chronic kidney disease with stage 5 chronic kidney disease or end stage renal disease: Secondary | ICD-10-CM | POA: Diagnosis present

## 2018-07-31 DIAGNOSIS — R011 Cardiac murmur, unspecified: Secondary | ICD-10-CM | POA: Diagnosis not present

## 2018-07-31 DIAGNOSIS — Z825 Family history of asthma and other chronic lower respiratory diseases: Secondary | ICD-10-CM | POA: Diagnosis not present

## 2018-07-31 DIAGNOSIS — E785 Hyperlipidemia, unspecified: Secondary | ICD-10-CM | POA: Diagnosis present

## 2018-07-31 DIAGNOSIS — I2510 Atherosclerotic heart disease of native coronary artery without angina pectoris: Secondary | ICD-10-CM | POA: Diagnosis present

## 2018-07-31 DIAGNOSIS — Z992 Dependence on renal dialysis: Secondary | ICD-10-CM | POA: Diagnosis not present

## 2018-07-31 DIAGNOSIS — E876 Hypokalemia: Secondary | ICD-10-CM | POA: Diagnosis present

## 2018-07-31 DIAGNOSIS — D72829 Elevated white blood cell count, unspecified: Secondary | ICD-10-CM | POA: Diagnosis present

## 2018-07-31 DIAGNOSIS — K254 Chronic or unspecified gastric ulcer with hemorrhage: Secondary | ICD-10-CM | POA: Diagnosis present

## 2018-07-31 DIAGNOSIS — Z794 Long term (current) use of insulin: Secondary | ICD-10-CM

## 2018-07-31 DIAGNOSIS — Z951 Presence of aortocoronary bypass graft: Secondary | ICD-10-CM

## 2018-07-31 DIAGNOSIS — I255 Ischemic cardiomyopathy: Secondary | ICD-10-CM | POA: Diagnosis present

## 2018-07-31 DIAGNOSIS — D62 Acute posthemorrhagic anemia: Secondary | ICD-10-CM | POA: Diagnosis present

## 2018-07-31 DIAGNOSIS — M898X9 Other specified disorders of bone, unspecified site: Secondary | ICD-10-CM | POA: Diagnosis present

## 2018-07-31 DIAGNOSIS — Z7982 Long term (current) use of aspirin: Secondary | ICD-10-CM

## 2018-07-31 DIAGNOSIS — K922 Gastrointestinal hemorrhage, unspecified: Secondary | ICD-10-CM | POA: Diagnosis present

## 2018-07-31 DIAGNOSIS — Z91138 Patient's unintentional underdosing of medication regimen for other reason: Secondary | ICD-10-CM

## 2018-07-31 DIAGNOSIS — Z9889 Other specified postprocedural states: Secondary | ICD-10-CM | POA: Diagnosis not present

## 2018-07-31 DIAGNOSIS — E1051 Type 1 diabetes mellitus with diabetic peripheral angiopathy without gangrene: Secondary | ICD-10-CM | POA: Diagnosis present

## 2018-07-31 DIAGNOSIS — Y830 Surgical operation with transplant of whole organ as the cause of abnormal reaction of the patient, or of later complication, without mention of misadventure at the time of the procedure: Secondary | ICD-10-CM | POA: Diagnosis present

## 2018-07-31 DIAGNOSIS — J90 Pleural effusion, not elsewhere classified: Secondary | ICD-10-CM | POA: Diagnosis present

## 2018-07-31 DIAGNOSIS — T471X6A Underdosing of other antacids and anti-gastric-secretion drugs, initial encounter: Secondary | ICD-10-CM | POA: Diagnosis present

## 2018-07-31 DIAGNOSIS — Z95820 Peripheral vascular angioplasty status with implants and grafts: Secondary | ICD-10-CM | POA: Diagnosis not present

## 2018-07-31 DIAGNOSIS — D649 Anemia, unspecified: Secondary | ICD-10-CM | POA: Diagnosis present

## 2018-07-31 DIAGNOSIS — E1022 Type 1 diabetes mellitus with diabetic chronic kidney disease: Secondary | ICD-10-CM | POA: Diagnosis present

## 2018-07-31 DIAGNOSIS — I4510 Unspecified right bundle-branch block: Secondary | ICD-10-CM | POA: Diagnosis present

## 2018-07-31 DIAGNOSIS — K250 Acute gastric ulcer with hemorrhage: Secondary | ICD-10-CM | POA: Diagnosis not present

## 2018-07-31 DIAGNOSIS — T86891 Other transplanted tissue failure: Secondary | ICD-10-CM | POA: Diagnosis present

## 2018-07-31 LAB — CBC WITH DIFFERENTIAL/PLATELET
Abs Immature Granulocytes: 0.18 10*3/uL — ABNORMAL HIGH (ref 0.00–0.07)
Basophils Absolute: 0.1 10*3/uL (ref 0.0–0.1)
Basophils Relative: 1 %
Eosinophils Absolute: 0.2 10*3/uL (ref 0.0–0.5)
Eosinophils Relative: 2 %
HCT: 24.8 % — ABNORMAL LOW (ref 36.0–46.0)
Hemoglobin: 7.5 g/dL — ABNORMAL LOW (ref 12.0–15.0)
Immature Granulocytes: 2 %
Lymphocytes Relative: 9 %
Lymphs Abs: 1.1 10*3/uL (ref 0.7–4.0)
MCH: 28.6 pg (ref 26.0–34.0)
MCHC: 30.2 g/dL (ref 30.0–36.0)
MCV: 94.7 fL (ref 80.0–100.0)
Monocytes Absolute: 0.7 10*3/uL (ref 0.1–1.0)
Monocytes Relative: 6 %
Neutro Abs: 10 10*3/uL — ABNORMAL HIGH (ref 1.7–7.7)
Neutrophils Relative %: 80 %
Platelets: 209 10*3/uL (ref 150–400)
RBC: 2.62 MIL/uL — ABNORMAL LOW (ref 3.87–5.11)
RDW: 17.1 % — ABNORMAL HIGH (ref 11.5–15.5)
WBC: 12.3 10*3/uL — ABNORMAL HIGH (ref 4.0–10.5)
nRBC: 0.8 % — ABNORMAL HIGH (ref 0.0–0.2)

## 2018-07-31 LAB — BASIC METABOLIC PANEL
Anion gap: 11 (ref 5–15)
BUN: 24 mg/dL — ABNORMAL HIGH (ref 6–20)
CO2: 24 mmol/L (ref 22–32)
Calcium: 8 mg/dL — ABNORMAL LOW (ref 8.9–10.3)
Chloride: 100 mmol/L (ref 98–111)
Creatinine, Ser: 8.57 mg/dL — ABNORMAL HIGH (ref 0.44–1.00)
GFR calc Af Amer: 6 mL/min — ABNORMAL LOW (ref >60)
GFR calc non Af Amer: 5 mL/min — ABNORMAL LOW (ref >60)
Glucose, Bld: 232 mg/dL — ABNORMAL HIGH (ref 70–99)
Potassium: 3.7 mmol/L (ref 3.5–5.1)
Sodium: 135 mmol/L (ref 135–145)

## 2018-07-31 LAB — POCT I-STAT EG7
Acid-Base Excess: 2 mmol/L (ref 0.0–2.0)
Bicarbonate: 27.2 mmol/L (ref 20.0–28.0)
Calcium, Ion: 1.21 mmol/L (ref 1.15–1.40)
HCT: 18 % — ABNORMAL LOW (ref 36.0–46.0)
Hemoglobin: 6.1 g/dL — CL (ref 12.0–15.0)
O2 Saturation: 55 %
Potassium: 3.5 mmol/L (ref 3.5–5.1)
Sodium: 136 mmol/L (ref 135–145)
TCO2: 29 mmol/L (ref 22–32)
pCO2, Ven: 48.7 mmHg (ref 44.0–60.0)
pH, Ven: 7.355 (ref 7.250–7.430)
pO2, Ven: 30 mmHg — CL (ref 32.0–45.0)

## 2018-07-31 LAB — PREPARE RBC (CROSSMATCH)

## 2018-07-31 LAB — GLUCOSE, CAPILLARY: Glucose-Capillary: 98 mg/dL (ref 70–99)

## 2018-07-31 LAB — POC OCCULT BLOOD, ED: Fecal Occult Bld: POSITIVE — AB

## 2018-07-31 MED ORDER — SODIUM CHLORIDE 0.9 % IV BOLUS
500.0000 mL | Freq: Once | INTRAVENOUS | Status: AC
  Administered 2018-07-31: 20:00:00 500 mL via INTRAVENOUS

## 2018-07-31 MED ORDER — PANTOPRAZOLE SODIUM 40 MG IV SOLR
40.0000 mg | Freq: Two times a day (BID) | INTRAVENOUS | Status: DC
  Administered 2018-08-01 – 2018-08-02 (×3): 40 mg via INTRAVENOUS
  Filled 2018-07-31 (×3): qty 40

## 2018-07-31 MED ORDER — SODIUM CHLORIDE 0.9 % IV BOLUS
1000.0000 mL | Freq: Once | INTRAVENOUS | Status: AC
  Administered 2018-07-31: 1000 mL via INTRAVENOUS

## 2018-07-31 MED ORDER — INSULIN ASPART 100 UNIT/ML ~~LOC~~ SOLN
0.0000 [IU] | Freq: Three times a day (TID) | SUBCUTANEOUS | Status: DC
  Administered 2018-08-01: 09:00:00 5 [IU] via SUBCUTANEOUS
  Administered 2018-08-01: 14:00:00 3 [IU] via SUBCUTANEOUS
  Administered 2018-08-01: 2 [IU] via SUBCUTANEOUS

## 2018-07-31 MED ORDER — GENTAMICIN SULFATE 0.1 % EX CREA
1.0000 "application " | TOPICAL_CREAM | Freq: Every day | CUTANEOUS | Status: DC
  Administered 2018-07-31 – 2018-08-01 (×2): 1 via TOPICAL
  Filled 2018-07-31: qty 15

## 2018-07-31 MED ORDER — PANTOPRAZOLE SODIUM 40 MG IV SOLR
40.0000 mg | Freq: Once | INTRAVENOUS | Status: AC
  Administered 2018-07-31: 17:00:00 40 mg via INTRAVENOUS
  Filled 2018-07-31: qty 40

## 2018-07-31 MED ORDER — HEPARIN 1000 UNIT/ML FOR PERITONEAL DIALYSIS: 500.0000 [IU] | INTRAMUSCULAR | Status: DC | PRN

## 2018-07-31 MED ORDER — DARBEPOETIN ALFA 200 MCG/0.4ML IJ SOSY
200.0000 ug | PREFILLED_SYRINGE | INTRAMUSCULAR | Status: DC
  Filled 2018-07-31: qty 0.4

## 2018-07-31 MED ORDER — SODIUM CHLORIDE 0.9% IV SOLUTION: Freq: Once | INTRAVENOUS | Status: DC

## 2018-07-31 MED ORDER — ONDANSETRON HCL 4 MG/2ML IJ SOLN
4.0000 mg | Freq: Once | INTRAMUSCULAR | Status: AC
  Administered 2018-07-31: 17:00:00 4 mg via INTRAVENOUS
  Filled 2018-07-31: qty 2

## 2018-07-31 NOTE — ED Notes (Signed)
ED TO INPATIENT HANDOFF REPORT  ED Nurse Name and Phone #: Percell Locus, RN  S Name/Age/Gender Priscilla [name] 48 y.o. female Room/Bed: 025C/025C  Code Status   Code Status: Prior  Home/SNF/Other Home Patient oriented to: self, place, time and situation Is this baseline? Yes   Triage Complete: Triage complete  Chief Complaint GI-Bleed  Triage Note Pt arrives with [facility] c/o GI bleed. Pt was hospitalized several times over the past few weeks. Pt states GI bleeding has not stopped since 3/15 and was recently d/c'd 2 days ago. Pt's BP is soft at 97/43.   Allergies Allergies  Allergen Reactions  . Pork-Derived Products Other (See Comments)    Pt is [place]    Level of Care/Admitting Diagnosis ED Disposition    ED Disposition Condition [facility]: [place] [[id number]]  Level of Care: Progressive [102]  Diagnosis: Acute GI bleeding [196222]  Admitting Physician: Cresenciano Lick  Attending Physician: Cresenciano Lick  Estimated length of stay: 3 - 4 days  Certification:: I certify this patient will need inpatient services for at least 2 midnights  PT Class (Do Not Modify): Inpatient [101]  PT Acc Code (Do Not Modify): Private [1]       B Medical/Surgery History Past Medical History:  Diagnosis Date  . Anemia   . CAD (coronary artery disease)   . Cardiomyopathy, ischemic 06/24/2014  . Diabetes mellitus without complication ([place])   . End stage renal disease ([place])   . Foot drop, left foot   . GERD (gastroesophageal reflux disease)   . H/O kidney transplant   . Hyperlipidemia   . Hypertension   . Myocardial infarction ([place])   . 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 Renal disorder   . 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 S/P mitral valve repair 07/18/14   Past Surgical History:  Procedure Laterality Date  . ABDOMINAL AORTOGRAM W/LOWER EXTREMITY N/A 09/19/2017   Procedure: ABDOMINAL AORTOGRAM W/LOWER EXTREMITY;  Surgeon:  Angelia Mould, MD;  Location: [place] [facility];  Service: Cardiovascular;  Laterality: N/A;  . CAPD INSERTION N/A 09/08/2013   Procedure: Laparoscopic CAPD peritoneal dialysis catheter placement, possible lysis of adhesions   ;  Surgeon: Adin Hector, MD;  Location: [place];  Service: General;  Laterality: N/A;  . CARDIAC CATHETERIZATION     06/2014  . CESAREAN SECTION    . COMBINED KIDNEY-PANCREAS TRANSPLANT  05/2010   [facility]  Pancreas left mid abdomen  . CORONARY ARTERY BYPASS GRAFT N/A 07/18/2014   Procedure: CORONARY ARTERY BYPASS GRAFTING (CABG)X2 LIMA-LAD; SVG-PD;  Surgeon: Melrose Nakayama, MD;  Location: [place];  Service: Open Heart Surgery;  Laterality: N/A;  . ESOPHAGOGASTRODUODENOSCOPY (EGD) WITH PROPOFOL N/A 07/12/2018   Procedure: ESOPHAGOGASTRODUODENOSCOPY (EGD) WITH PROPOFOL;  Surgeon: Clarene Essex, MD;  Location: [place];  Service: Endoscopy;  Laterality: N/A;  . ESOPHAGOGASTRODUODENOSCOPY (EGD) WITH PROPOFOL N/A 07/13/2018   Procedure: ESOPHAGOGASTRODUODENOSCOPY (EGD) WITH PROPOFOL;  Surgeon: Ronnette Juniper, MD;  Location: [place];  Service: Gastroenterology;  Laterality: N/A;  . ESOPHAGOGASTRODUODENOSCOPY (EGD) WITH PROPOFOL N/A 07/19/2018   Procedure: ESOPHAGOGASTRODUODENOSCOPY (EGD) WITH PROPOFOL;  Surgeon: Ronnette Juniper, MD;  Location: [place];  Service: Gastroenterology;  Laterality: N/A;  . ESOPHAGOGASTRODUODENOSCOPY (EGD) WITH PROPOFOL N/A 07/28/2018   Procedure: ESOPHAGOGASTRODUODENOSCOPY (EGD) WITH PROPOFOL;  Surgeon: Otis Brace, MD;  [facility];  Service: Gastroenterology;  Laterality: N/A;  . FEMORAL ARTERY EXPLORATION Right 07/07/2018   Procedure: REPAIR  RIGHT COMMON FEMORAL ARTERY TO FEMORAL VEIN FISTULA  WITH VEIN PATCH ANGIOPLASTY;  Surgeon: Angelia Mould, MD;  Location: [place];  Service: Vascular;  Laterality: Right;  . GROIN DEBRIDEMENT Right 07/22/2018   Procedure: GROIN DEBRIDEMENT , POSSIBLE VAC;   Surgeon: Rosetta Posner, MD;  Location: [place];  Service: Vascular;  Laterality: Right;  . HEMOSTASIS CLIP PLACEMENT  07/13/2018   Procedure: HEMOSTASIS CLIP PLACEMENT;  Surgeon: Ronnette Juniper, MD;  Location: [place];  Service: Gastroenterology;;  . HOT HEMOSTASIS N/A 07/13/2018   Procedure: HOT HEMOSTASIS (ARGON PLASMA COAGULATION/BICAP);  Surgeon: Ronnette Juniper, MD;  Location: [place];  Service: Gastroenterology;  Laterality: N/A;  . HOT HEMOSTASIS N/A 07/19/2018   Procedure: HOT HEMOSTASIS (ARGON PLASMA COAGULATION/BICAP);  Surgeon: Ronnette Juniper, MD;  Location: [place];  Service: Gastroenterology;  Laterality: N/A;  . INSERTION OF DIALYSIS CATHETER  September 28, 2012   Right upper chest  . LAPAROSCOPIC INSERTION PERITONEAL CATHETER  07/18/2008   Dr Excell Seltzer  . LAPAROSCOPIC REPOSITIONING CAPD CATHETER  Aug 29, 202010   Dr Excell Seltzer  . LAPAROSCOPIC REPOSITIONING CAPD CATHETER  11/08/2008   Dr Excell Seltzer  . LAPAROSCOPIC REPOSITIONING CAPD CATHETER  10/03/2009   Unplugging CAPD catheter Dr Excell Seltzer  . LEFT HEART CATHETERIZATION WITH CORONARY ANGIOGRAM N/A 06/28/2014   Procedure: LEFT HEART CATHETERIZATION WITH CORONARY ANGIOGRAM;  Surgeon: [name], MD;  Location: [place] [facility];  Service: Cardiovascular;  Laterality: N/A;  . MITRAL VALVE REPAIR N/A 07/18/2014   Procedure: MITRAL VALVE REPAIR (MVR);  Surgeon: Melrose Nakayama, MD;  Location: [place];  Service: Open Heart Surgery;  Laterality: N/A;  . OMENTECTOMY  11/08/2008   Dr Excell Seltzer  . PERIPHERAL VASCULAR INTERVENTION Bilateral 09/19/2017   Procedure: PERIPHERAL VASCULAR INTERVENTION;  Surgeon: Angelia Mould, MD;  Location: [place] [facility];  Service: Cardiovascular;  Laterality: Bilateral;  Iliac stents  . SCLEROTHERAPY  07/13/2018   Procedure: Clide Deutscher;  Surgeon: Ronnette Juniper, MD;  Location: [facility];  Service: Gastroenterology;;  . SCLEROTHERAPY  07/19/2018   Procedure: Clide Deutscher;  Surgeon: Ronnette Juniper, MD;  Location: [place];  Service: Gastroenterology;;  . TEE WITHOUT CARDIOVERSION N/A 07/18/2014   Procedure: TRANSESOPHAGEAL ECHOCARDIOGRAM (TEE);  Surgeon: Melrose Nakayama, MD;  Location: [place];  Service: Open Heart Surgery;  Laterality: N/A;     A IV Location/Drains/Wounds Patient Lines/Drains/Airways Status   Active Line/Drains/Airways    Name:   Placement date:   Placement time:   Site:   Days:   Peripheral IV 07/31/18 Right Hand   07/31/18    1400    Hand   less than 1   Negative Pressure Wound Therapy Groin Right   07/22/18    1231    -   9   Incision (Closed) 07/07/18 Groin Right   07/07/18    0929     24   Incision (Closed) 07/22/18 Groin Right   07/22/18    1228     9   Wound / Incision (Open or Dehisced) 07/14/18 Diabetic ulcer Toe (Comment  which one) Right;Posterior   07/14/18    0800    Toe (Comment  which one)   17          Intake/Output Last 24 hours No intake or output data in the 24 hours ending 07/31/18 1556  Labs/Imaging Results for orders placed or performed during the hospital encounter of 07/31/18 (from the past 48 hour(s))  Type and screen     Status: None (Preliminary result)  Collection Time: 07/31/18  1:40 PM  Result Value Ref Range   ABO/RH(D) PENDING    Antibody Screen PENDING    Sample Expiration      08/03/2018 Performed at [facility], [place] [address], [place], [place] [address]   CBC with Differential     Status: Abnormal   Collection Time: 07/31/18  1:55 PM  Result Value Ref Range   WBC 12.3 (H) 4.0 - 10.5 K/uL   RBC 2.62 (L) 3.87 - 5.11 MIL/uL   Hemoglobin 7.5 (L) 12.0 - 15.0 g/dL   HCT 24.8 (L) 36.0 - 46.0 %   MCV 94.7 80.0 - 100.0 fL   MCH 28.6 26.0 - 34.0 pg   MCHC 30.2 30.0 - 36.0 g/dL   RDW 17.1 (H) 11.5 - 15.5 %   Platelets 209 150 - 400 K/uL   nRBC 0.8 (H) 0.0 - 0.2 %   Neutrophils Relative % 80 %   Neutro Abs 10.0 (H) 1.7 - 7.7 K/uL   Lymphocytes Relative 9 %   Lymphs Abs 1.1 0.7 - 4.0 K/uL   Monocytes Relative 6 %    Monocytes Absolute 0.7 0.1 - 1.0 K/uL   Eosinophils Relative 2 %   Eosinophils Absolute 0.2 0.0 - 0.5 K/uL   Basophils Relative 1 %   Basophils Absolute 0.1 0.0 - 0.1 K/uL   Immature Granulocytes 2 %   Abs Immature Granulocytes 0.18 (H) 0.00 - 0.07 K/uL    Comment: Performed at [facility] [address], [place], [place] [address]  Basic metabolic panel     Status: Abnormal   Collection Time: 07/31/18  1:55 PM  Result Value Ref Range   Sodium 135 135 - 145 mmol/L   Potassium 3.7 3.5 - 5.1 mmol/L   Chloride 100 98 - 111 mmol/L   CO2 24 22 - 32 mmol/L   Glucose, Bld 232 (H) 70 - 99 mg/dL   BUN 24 (H) 6 - 20 mg/dL   Creatinine, Ser 8.57 (H) 0.44 - 1.00 mg/dL   Calcium 8.0 (L) 8.9 - 10.3 mg/dL   GFR calc non Af Amer 5 (L) >60 mL/min   GFR calc Af Amer 6 (L) >60 mL/min   Anion gap 11 5 - 15    Comment: Performed at [facility] [address], [place], [place] [address]  POC occult blood, ED     Status: Abnormal   Collection Time: 07/31/18  2:00 PM  Result Value Ref Range   Fecal Occult Bld POSITIVE (A) NEGATIVE  POCT I-Stat EG7     Status: Abnormal   Collection Time: 07/31/18  2:28 PM  Result Value Ref Range   pH, Ven 7.355 7.250 - 7.430   pCO2, Ven 48.7 44.0 - 60.0 mmHg   pO2, Ven 30.0 (LL) 32.0 - 45.0 mmHg   Bicarbonate 27.2 20.0 - 28.0 mmol/L   TCO2 29 22 - 32 mmol/L   O2 Saturation 55.0 %   Acid-Base Excess 2.0 0.0 - 2.0 mmol/L   Sodium 136 135 - 145 mmol/L   Potassium 3.5 3.5 - 5.1 mmol/L   Calcium, Ion 1.21 1.15 - 1.40 mmol/L   HCT 18.0 (L) 36.0 - 46.0 %   Hemoglobin 6.1 (LL) 12.0 - 15.0 g/dL   Patient temperature HIDE    Sample type VENOUS    Comment NOTIFIED PHYSICIAN   Prepare RBC     Status: None   Collection Time: 07/31/18  3:09 PM  Result Value  Ref Range   Order Confirmation      ORDER PROCESSED BY BLOOD BANK Performed at [facility], [place] [address] [place], [place] [address]    Dg Chest Portable 1 View  Result Date:  07/31/2018 CLINICAL DATA:  Shortness of breath. EXAM: PORTABLE CHEST 1 VIEW COMPARISON:  07/26/2018 FINDINGS: Sternotomy wires unchanged. Lungs are adequately inflated demonstrate persistent moderate size left pleural effusion likely with associated left basilar atelectasis with possible slight interval worsening. Mild prominence of the left perihilar markings. Right lung centrally clear. Mild stable cardiomegaly. Remainder the exam is unchanged. IMPRESSION: Continued evidence of moderate size left pleural effusion likely with associated basilar atelectasis with slight interval worsening. Infection in the left base is possible. Stable cardiomegaly. Electronically Signed   By: Marin Olp M.D.   On: 07/31/2018 15:27    Pending Labs Unresulted Labs (From admission, onward)   None      Vitals/Pain Today's Vitals   07/31/18 1426 07/31/18 1500 07/31/18 1548 07/31/18 1549  BP: (!) 101/56 126/79    Pulse: 74  (!) 37   Resp: 17 12 15    Temp:    97.9 F (36.6 C)  TempSrc:    Oral  SpO2:   95%   Weight:      Height:      PainSc:        Isolation Precautions No active isolations  Medications Medications  0.9 %  sodium chloride infusion (Manually program via Guardrails IV Fluids) (has no administration in time range)  pantoprazole (PROTONIX) injection 40 mg (has no administration in time range)  sodium chloride 0.9 % bolus 1,000 mL (1,000 mLs Intravenous New Bag/Given 07/31/18 1411)    Mobility walks with device Moderate fall risk   Focused Assessments Cardiac Assessment Handoff:  Cardiac Rhythm: Normal sinus rhythm Lab Results  Component Value Date   CKTOTAL 71 09/24/2012   TROPONINI 1.16 ([place]) 05/15/2016   No results found for: DDIMER Does the Patient currently have chest pain? No     R Recommendations: See Admitting Provider Note  Report given to:   Additional Notes:

## 2018-07-31 NOTE — Progress Notes (Signed)
Subjective: Interval History: Just d/c 3 d ago after GIB from gastritis and GUD.  This am severe abdm pain, vomited blood, black stools. No prob with exchanges, and fluid clear.   Hb down 8.2 to 6.5..  Objective: Vital signs in last 24 hours: Temp:  [97.5 F (36.4 C)-97.9 F (36.6 C)] 97.5 F (36.4 C) (04/03 1652) Pulse Rate:  [31-121] 121 (04/03 1719) Resp:  [11-20] 15 (04/03 1719) BP: (60-126)/(33-82) 70/49 (04/03 1719) SpO2:  [83 %-100 %] 94 % (04/03 1719) Weight:  [56 kg] 56 kg (04/03 1341) Weight change:   Intake/Output from previous day: No intake/output data recorded. Intake/Output this shift: No intake/output data recorded.  General appearance: alert, cooperative, mild distress and pale Resp: clear to auscultation bilaterally Cardio: S1, S2 normal and systolic murmur: systolic ejection 2/6, crescendo and decrescendo at 2nd left intercostal space GI: pos bs, PD ES ok , minimal tender, no peritoneal signs Extremities: edema 1+  Lab Results: Recent Labs    07/31/18 1355 07/31/18 1428  WBC 12.3*  --   HGB 7.5* 6.1*  HCT 24.8* 18.0*  PLT 209  --    BMET:  Recent Labs    07/31/18 1355 07/31/18 1428  NA 135 136  K 3.7 3.5  CL 100  --   CO2 24  --   GLUCOSE 232*  --   BUN 24*  --   CREATININE 8.57*  --   CALCIUM 8.0*  --    No results for input(s): PTH in the last 72 hours. Iron Studies: No results for input(s): IRON, TIBC, TRANSFERRIN, FERRITIN in the last 72 hours.  Studies/Results: Dg Chest Portable 1 View  Result Date: 07/31/2018 CLINICAL DATA:  Shortness of breath. EXAM: PORTABLE CHEST 1 VIEW COMPARISON:  07/26/2018 FINDINGS: Sternotomy wires unchanged. Lungs are adequately inflated demonstrate persistent moderate size left pleural effusion likely with associated left basilar atelectasis with possible slight interval worsening. Mild prominence of the left perihilar markings. Right lung centrally clear. Mild stable cardiomegaly. Remainder the exam is  unchanged. IMPRESSION: Continued evidence of moderate size left pleural effusion likely with associated basilar atelectasis with slight interval worsening. Infection in the left base is possible. Stable cardiomegaly. Electronically Signed   By: Marin Olp M.D.   On: 07/31/2018 15:27    I have reviewed the patient's current medications.  Assessment/Plan: 1 GIB  Recurrent , suspect same source.  Needs TX, PPI, prob acute GI intervention.  Bp low.  2 ESRD watch vol, K with transfusions.  Will do PD tonight with 1.5%.   3 DM per primary 4 Anemia got Fe last week, give ESA 5 HPTH no phos, check in am 6 CM P PD, 1.5%, esa, PPI, follow vol, K    LOS: 0 days   Jeneen Rinks Oden Lindaman 07/31/2018,6:09 PM

## 2018-07-31 NOTE — H&P (Addendum)
Date: 07/31/2018               Patient Name:  Priscilla [name] MRN: [id number]  DOB: [birth year] Age / Sex: 48 y.o., female   PCP: [name], MD         Medical Service: Internal Medicine Teaching Service         Attending Physician: Dr. Sid Falcon, MD    First Contact: Dr. Sherry Ruffing Pager: [phone]  Second Contact: Dr. Maricela Bo Pager: [phone]       After Hours (After 5p/  First Contact Pager: [phone]  weekends / holidays): Second Contact Pager: [phone]   Chief Complaint: Weakness, hematemesis  History of Present Illness: This is a 48 female with a history of ESRD s/p renal transplant failure x 2 on peritoneal dialysis, DM type 1, pancreatic insufficiency s/p failed pancreatic transplant, PVD s/p bilateral iliac stents, ischemic cardiomyopathy and mitral regurgitation s/p valve repair, CABG x2, and HTN who has had recent admissions for GI bleeding who presented with a  1 day history of hematemesis and dark colored stools. She reports that yesterday she had 3 episodes of dark stools, and this morning she had some nausea and vomited up some dark coffee ground emesis. She reported some light headedness, dizziness, shortness of breath, fatigue and generalized weakness. She denied any fevers, chills, chest pain, abdominal pain, or other areas of bleeding. She had recently been discharged 3 days ago where she was admitted for GI bleed with melenic stools.  She states that she was unable to she reported that for the past 2 day she was feeling good, she had more energy, did not have any bleeding and was able to walk around with no issues. The home health nurse and wound care nurse PT and came in to work with her. She reported that her right groin wound was doing well.   She was admitted from 07/12/18 to 07/24/18 for an UGIB 2/2 bleeding gastric ulcer requiring ICU care with pressors.  At that time she had her Plavix discontinued and aspirin was continued.  She was readmitted from 3/29-3/31  for melena and generalized weakness, vitals at that time are relatively hemoglobin have been stable.  EGD done on 3/31 showed healing gastric ulcer in the proximal body of the stomach with mild distal esophagitis, was not that the melena was due to prior GI bleeding and she was discharged with her aspirin continued her PPI.  In the ED she was noted to be afebrile, tachycardic, with soft blood pressures down to 97/43. She was noted to have dark black stools on rectal exam. Labs were significant for anemia with Hgb down to 6.1 on H/H, Hgb on CBC was 7.5 with MCV 94. BMP showed a K 3.7. She was given 1 L NS and started on protonix 40 mg BID, and 2 units of pRBC were ordered. She was doing well until the nurse went to check her around 4 where she was noted to be hypotensive down to 60/40 and appeared very fatigued. She was getting a 1L bolus of NS at that time and her blood pressure slightly improved but was not stable.   Meds:  No outpatient medications have been marked as taking for the 07/31/18 encounter [facility] Encounter).     Allergies: Allergies as of 07/31/2018 - Review Complete 07/31/2018  Allergen Reaction Noted   Pork-derived products Other (See Comments) 09/25/2012   Past Medical History:  Diagnosis Date   Anemia    CAD (coronary  artery disease)    Cardiomyopathy, ischemic 06/24/2014   Diabetes mellitus without complication ([facility])    End stage renal disease ([facility])    Foot drop, left foot    GERD (gastroesophageal reflux disease)    H/O kidney transplant    Hyperlipidemia    Hypertension    Myocardial infarction [facility])    Pancreas transplanted [facility])    Renal disorder    S/P CABG x 2 07/18/14   S/P mitral valve repair 07/18/14    Family History:  Family History  Problem Relation Age of Onset   CAD Mother    Stroke Mother    COPD Father    Colon cancer Neg Hx      Social History: Denies smoking, EtOH or drug use. Lives with her 2 sisters and her son.    Review of Systems: A complete ROS was negative except as per HPI.   Physical Exam: Blood pressure (!) 142/50, pulse 74, temperature (!) 97.5 F (36.4 C), temperature source Oral, resp. rate 15, height 5' (1.524 m), weight 56 kg, SpO2 94 %. Physical Exam  Constitutional: She is oriented to person, place, and time.  Fatigued appearing female, NAD  HENT:  Head: Normocephalic.  Eyes: Pupils are equal, round, and reactive to light.  Conjunctival pallor  Neck: Normal range of motion. Neck supple.  Cardiovascular: Normal rate and regular rhythm.  Systolic murmur  Pulmonary/Chest: Effort normal and breath sounds normal. No respiratory distress.  Abdominal: Soft. Bowel sounds are normal.  Right groin: wound vac in place measuring about 4 cm x 2 cm, no erythema, edema, drainage, or tenderness around the area  Musculoskeletal: Normal range of motion.        General: No edema.  Neurological: She is alert and oriented to person, place, and time.  Skin: Skin is warm and dry. There is pallor.  Psychiatric: Mood and affect normal.    EKG: personally reviewed my interpretation is sinus rhythm with RBBB  CXR: personally reviewed my interpretation is chronic left pleural effusion with basilar atelecctasis  Assessment & Plan by Problem: Active Problems:   Acute GI bleeding  Acute GI bleed: Symptomatic anemia This is a 48 year old female with history of upper GI bleeding secondary to gastric ulcers presented with both hematemasis and melena.  She also noted increased fatigue, weakness, lightheadedness, shortness of breath, and nausea.  On exam she was noted to be fatigued appearing, with conjunctival pallor and decreased capillary refill.  Labs are significant for hemoglobin down to 6.1, she is still having dark stool while she is in the ED. Etiology is possibly the known GI ulcer, she had not been taking her PPI which may have exacerbated the bleed.She was tachycardic and initially had soft blood  pressures however on our evaluation she developed hypotension into the 70/40s.  She currently received 1 L of normal saline and a second bag is open, she has had poor response to the fluids thus far.  -Receiving 2 units pRBC -Repeat H/H following this -Continue IV fluids -Consulted PCCM, may need pressor support if MAP does not stay >65. Due to COVID crisis they are trying to avoid ICU transfer unless necessary, we will try to manage with IV fluids and blood products but will re-consult if she does not respond to these therapies -Protonix 40 mg BID -Hold ASA -GI was consulted, they recommended giving the blood products and the PPI however no emergent need for an EGD at this time.  -Admit to progressive care  ESRD  on PD: -Patient did her peritoneal dialysis yesterday, she reports that she never misses her dialysis sessions. Her BMP today showed no urgent need for dialysis.  -Consult nephrology  -Dialysis per nephro  Diabetes type 1: -She is on 10 units NPH insulin at home and humalog 1-10 units TID -NPH 7 units QHS -SSI -Frequent CBGs  Right groin wound infection: -Wound vac in place, no evidence of infection around the area. No drainage or pain.  -Wound care consult -Daily CBC and BMP  History of pancreatic insufficiency status post failed pancreatic transplant: -Continue home Creon  PVD: -Hold home aspirin for now  History of ischemic cardiomyopathy, severe MR status post mitral valve repair, PFO status post closure, and hypertension -She was on Coreg, losartan, and clonidine previously, however these were discontinued on recent admissions.  Blood pressures have been soft/hypotensive.  Continue to monitor.  FEN: 2 units pRBC and another 1 bolus of NS, replete lytes prn, NPO VTE ppx: SCDs Code Status: FULL    Dispo: Admit patient to Inpatient with expected length of stay greater than 2 midnights.  Signed: Asencion Noble, MD 07/31/2018, 6:36 PM  Pager: [phone]

## 2018-07-31 NOTE — Progress Notes (Signed)
Inpatient Diabetes Program Recommendations  AACE/ADA: New Consensus Statement on Inpatient Glycemic Control (2015)  Target Ranges:  Prepandial:   less than 140 mg/dL      Peak postprandial:   less than 180 mg/dL (1-2 hours)      Critically ill patients:  140 - 180 mg/dL   Results for Priscilla [name], Priscilla [name] (MRN [id number]) as of 07/31/2018 16:13  Ref. Range 07/31/2018 13:55  Glucose Latest Ref Range: 70 - 99 mg/dL 232 (H)    Admit with: GIB  History: Type 1 Diabetes, Pancreas/Kidney Transplant, ESRD (gets PD at home)  Home DM Meds: NPH Insulin 10 units QHS       Humalog 1-10 units TID  Current Orders: None yet      MD--When patient admitted, please consider the following:  1. Start Novolog Sensitive Correction Scale/ SSI (0-9 units) Q4 hours  2. Start NPH 7 units QHS tonight (70% total home dose to start)      --Will follow patient during hospitalization--  Wyn Quaker RN, MSN, CDE Diabetes Coordinator Inpatient Glycemic Control Team Team Pager: [phone] (8a-5p)

## 2018-07-31 NOTE — ED Triage Notes (Addendum)
Pt arrives with [facility] c/o GI bleed. Pt was hospitalized several times over the past few weeks. Pt states GI bleeding has not stopped since 3/15 and was recently d/c'd 2 days ago. Pt's BP is soft at 97/43.

## 2018-07-31 NOTE — ED Notes (Signed)
IV team at bedside 

## 2018-07-31 NOTE — ED Provider Notes (Signed)
[facility] EMERGENCY DEPARTMENT Provider Note   CSN: [id number] Arrival date & time: 07/31/18  1327    History   Chief Complaint Chief Complaint  Patient presents with   GI Problem    GI bleed    HPI Priscilla [name] is a 48 y.o. female with history of ESRD on peritoneal dialysis at home daily, CAD, diabetes mellitus, MI status post CABG x2, malnutrition resents for evaluation of persistent and worsening generalized weakness as well as bloody stools.  She reports that earlier today while attempting to have a bowel movement she became nauseated and had one episode of "burgundy" emesis which she describes as "a little like coffee grounds and some blood clots".  She also noticed black tarry stools.  2 recent admissions for GI bleed, discharged most recently on 07/28/2018. EGD done 07/28/18 showed healing gastric ulcer in proximal body of stomach with mild distal esophagitis.  She reports that the Protonix was not sent to her pharmacy so she has not been able to take this for the last 2 days.  She denies any abdominal pain, fevers, or chest pains.  She notes shortness of breath, lightheadedness, significant fatigue especially with any attempts to ambulate and states that she feels so weak that she cannot sit upright anymore.  Does not currently make any urine.     The history is provided by the patient.    Past Medical History:  Diagnosis Date   Anemia    CAD (coronary artery disease)    Cardiomyopathy, ischemic 06/24/2014   Diabetes mellitus without complication (HCC)    End stage renal disease (HCC)    Foot drop, left foot    GERD (gastroesophageal reflux disease)    H/O kidney transplant    Hyperlipidemia    Hypertension    Myocardial infarction ([place])    Pancreas transplanted ([place])    Renal disorder    S/P CABG x 2 07/18/14   S/P mitral valve repair 07/18/14    Patient Active Problem List   Diagnosis Date Noted   Acute GI bleeding  07/31/2018   Rectal bleeding    Weakness    ESRD on peritoneal dialysis [facility])    PVD (peripheral vascular disease) ([place])    Lower GI bleed 07/26/2018   Open wound of right hip and thigh with complication    Malnutrition of moderate degree 07/17/2018   GI bleed 07/12/2018   Hypovolemic shock ([place])    Anemia associated with acute blood loss    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[place]) 07/07/2018   History of mitral valve annuloplasty #28 Memo ring 08/02/2016   Mixed hyperlipidemia 08/02/2016   Elevation of cardiac enzymes    Difficult intravenous access    DKA (diabetic ketoacidoses) ([place]) 05/14/2016   Microcytic anemia 05/14/2016   Hyponatremia 05/14/2016   Transaminitis 05/14/2016   CHF (congestive heart failure) ([place])    Essential hypertension 11/28/2014   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   Mitral regurgitation 06/29/2014   Abnormal EKG    Coronary artery disease involving native coronary artery of native heart without angina pectoris    Cardiomyopathy, ischemic 06/24/2014   Abnormal nuclear cardiac imaging test 06/24/2014   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   ESRD (end stage renal disease) ([place]) 09/24/2012   Diabetes mellitus type 1 ([place]) 09/24/2012   H/O pancreas transplant Jan2012 - Left mid abdomen 09/24/2012   Pancreatitis of pancreas transplant 09/24/2012    Past Surgical History:  Procedure Laterality Date  ABDOMINAL AORTOGRAM W/LOWER EXTREMITY N/A 09/19/2017   Procedure: ABDOMINAL AORTOGRAM W/LOWER EXTREMITY;  Surgeon: Angelia Mould, MD;  Location: [facility] CV LAB;  Service: Cardiovascular;  Laterality: N/A;   CAPD INSERTION N/A 09/08/2013   Procedure: Laparoscopic CAPD peritoneal dialysis catheter placement, possible lysis of adhesions   ;  Surgeon: Adin Hector, MD;  Location: [facility];  Service: General;  Laterality: N/A;   CARDIAC CATHETERIZATION     06/2014   CESAREAN SECTION     COMBINED  KIDNEY-PANCREAS TRANSPLANT  05/2010   [facility]..  Pancreas left mid abdomen   CORONARY ARTERY BYPASS GRAFT N/A 07/18/2014   Procedure: CORONARY ARTERY BYPASS GRAFTING (CABG)X2 LIMA-LAD; SVG-PD;  Surgeon: Melrose Nakayama, MD;  Location: [facility];  Service: Open Heart Surgery;  Laterality: N/A;   ESOPHAGOGASTRODUODENOSCOPY (EGD) WITH PROPOFOL N/A 07/12/2018   Procedure: ESOPHAGOGASTRODUODENOSCOPY (EGD) WITH PROPOFOL;  Surgeon: Clarene Essex, MD;  Location: [facility];  Service: Endoscopy;  Laterality: N/A;   ESOPHAGOGASTRODUODENOSCOPY (EGD) WITH PROPOFOL N/A 07/13/2018   Procedure: ESOPHAGOGASTRODUODENOSCOPY (EGD) WITH PROPOFOL;  Surgeon: Ronnette Juniper, MD;  Location: [facility];  Service: Gastroenterology;  Laterality: N/A;   ESOPHAGOGASTRODUODENOSCOPY (EGD) WITH PROPOFOL N/A 07/19/2018   Procedure: ESOPHAGOGASTRODUODENOSCOPY (EGD) WITH PROPOFOL;  Surgeon: Ronnette Juniper, MD;  Location: [place];  Service: Gastroenterology;  Laterality: N/A;   ESOPHAGOGASTRODUODENOSCOPY (EGD) WITH PROPOFOL N/A 07/28/2018   Procedure: ESOPHAGOGASTRODUODENOSCOPY (EGD) WITH PROPOFOL;  Surgeon: Otis Brace, MD;  Location: [facility];  Service: Gastroenterology;  Laterality: N/A;   FEMORAL ARTERY EXPLORATION Right 07/07/2018   Procedure: REPAIR RIGHT COMMON FEMORAL ARTERY TO FEMORAL VEIN FISTULA  WITH VEIN PATCH ANGIOPLASTY;  Surgeon: Angelia Mould, MD;  Location: [place] [facility];  Service: Vascular;  Laterality: Right;   GROIN DEBRIDEMENT Right 07/22/2018   Procedure: GROIN DEBRIDEMENT , POSSIBLE VAC;  Surgeon: Rosetta Posner, MD;  Location: [place];  Service: Vascular;  Laterality: Right;   HEMOSTASIS CLIP PLACEMENT  07/13/2018   Procedure: HEMOSTASIS CLIP PLACEMENT;  Surgeon: Ronnette Juniper, MD;  Location: [place];  Service: Gastroenterology;;   HOT HEMOSTASIS N/A 07/13/2018   Procedure: HOT HEMOSTASIS (ARGON PLASMA COAGULATION/BICAP);  Surgeon: Ronnette Juniper, MD;  Location: [facility];   Service: Gastroenterology;  Laterality: N/A;   HOT HEMOSTASIS N/A 07/19/2018   Procedure: HOT HEMOSTASIS (ARGON PLASMA COAGULATION/BICAP);  Surgeon: Ronnette Juniper, MD;  Location: [facility];  Service: Gastroenterology;  Laterality: N/A;   INSERTION OF DIALYSIS CATHETER  September 28, 2012   Right upper chest   LAPAROSCOPIC INSERTION PERITONEAL CATHETER  07/18/2008   Dr Excell Seltzer   LAPAROSCOPIC REPOSITIONING CAPD CATHETER  2020-05-1908   Dr Excell Seltzer   LAPAROSCOPIC REPOSITIONING CAPD CATHETER  11/08/2008   Dr Excell Seltzer   LAPAROSCOPIC REPOSITIONING CAPD CATHETER  10/03/2009   Unplugging CAPD catheter Dr Excell Seltzer   LEFT HEART CATHETERIZATION WITH CORONARY ANGIOGRAM N/A 06/28/2014   Procedure: LEFT HEART CATHETERIZATION WITH CORONARY ANGIOGRAM;  Surgeon: [name], MD;  Location: [facility] CATH LAB;  Service: Cardiovascular;  Laterality: N/A;   MITRAL VALVE REPAIR N/A 07/18/2014   Procedure: MITRAL VALVE REPAIR (MVR);  Surgeon: Melrose Nakayama, MD;  Location: [facility];  Service: Open Heart Surgery;  Laterality: N/A;   OMENTECTOMY  11/08/2008   Dr Excell Seltzer   PERIPHERAL VASCULAR INTERVENTION Bilateral 09/19/2017   Procedure: PERIPHERAL VASCULAR INTERVENTION;  Surgeon: Angelia Mould, MD;  Location: [facility] CV LAB;  Service: Cardiovascular;  Laterality: Bilateral;  Iliac stents   SCLEROTHERAPY  07/13/2018   Procedure: SCLEROTHERAPY;  Surgeon: Therisa Doyne,  Megan Salon, MD;  Location: [facility] ENDOSCOPY;  Service: Gastroenterology;;   Clide Deutscher  07/19/2018   Procedure: Clide Deutscher;  Surgeon: Ronnette Juniper, MD;  Location: [facility];  Service: Gastroenterology;;   TEE WITHOUT CARDIOVERSION N/A 07/18/2014   Procedure: TRANSESOPHAGEAL ECHOCARDIOGRAM (TEE);  Surgeon: Melrose Nakayama, MD;  Location: [place];  Service: Open Heart Surgery;  Laterality: N/A;     OB History   No obstetric history on file.      Home Medications    Prior to Admission medications   Medication Sig Start Date End Date Taking?  Authorizing Provider  acetaminophen (TYLENOL) 325 MG tablet Take 650 mg by mouth every 4 (four) hours as needed for mild pain.    [provider]  amitriptyline (ELAVIL) 10 MG tablet Take 10 mg by mouth at bedtime.  01/19/18   [provider]  aspirin 81 MG chewable tablet Chew 1 tablet (81 mg total) by mouth daily. 07/25/18   Asencion [place], MD  atorvastatin (LIPITOR) 10 MG tablet Take 10 mg by mouth at bedtime.     [provider]  calcitRIOL (ROCALTROL) 0.5 MCG capsule Take 0.5 mcg by mouth at bedtime.     [provider]  calcium acetate (PHOSLO) 667 MG capsule Take 2,001 mg by mouth See admin instructions. Take 3 capsules (2001 mg) by mouth three times daily with meals, take 2 capsules (1334 mg) with snacks    [provider]  gabapentin (NEURONTIN) 100 MG capsule Take 100 mg by mouth 3 (three) times daily.    [provider]  gentamicin cream (GARAMYCIN) 0.1 % Apply 1 application topically See admin instructions. Apply topically to dialysis site daily after showering    [provider]  HYDROmorphone (DILAUDID) 2 MG tablet Take 1 tablet (2 mg total) by mouth every 6 (six) hours as needed for moderate pain or severe pain. 07/24/18   Asencion Noble, MD  insulin lispro (HUMALOG) 100 UNIT/ML injection Inject 1-10 Units into the skin 3 (three) times daily before meals. Per sliding scale    [provider]  insulin NPH Human (HUMULIN N,NOVOLIN N) 100 UNIT/ML injection Inject 0.1 mLs (10 Units total) into the skin at bedtime. 07/24/18   Asencion Noble, MD  levonorgestrel (MIRENA) 20 MCG/24HR IUD 1 each by Intrauterine route once. Implanted March 2018    [provider]  lipase/protease/amylase (CREON) 36000 UNITS CPEP capsule Take 72,000-108,000 Units by mouth See admin instructions. Take 3 capsules (108,000 units) by mouth three times daily with meals, take 2 capsules (72,000 units) with snacks    [provider]  loperamide (IMODIUM) 2 MG capsule Take 2 mg by mouth See admin instructions. Take one capsule (2 mg) by mouth with every dose of Creon    [provider]  multivitamin (RENA-VIT) TABS tablet Take 1 tablet by mouth at bedtime.    [provider]  pantoprazole (PROTONIX) 40 MG tablet Take 1 tablet (40 mg total) by mouth 2 (two) times daily. 07/28/18   Asencion Noble, MD  potassium chloride SA (K-DUR,KLOR-CON) 20 MEQ tablet Take 1 tablet (20 mEq total) by mouth daily. Patient taking differently: Take 20 mEq by mouth at bedtime.  07/03/18 10/01/18  Almyra Deforest, PA  Travoprost, BAK Free, (TRAVATAN) 0.004 % SOLN ophthalmic solution Place 1 drop into both eyes at bedtime.    [provider]    Family History Family History  Problem Relation Age of Onset   CAD Mother  Stroke Mother    COPD Father    Colon cancer Neg Hx     Social History Social History   Tobacco Use   Smoking status: Never Smoker   Smokeless tobacco: Never Used  Substance Use Topics   Alcohol use: No    Alcohol/week: 0.0 standard drinks   Drug use: No     Allergies   Pork-derived products   Review of Systems Review of Systems  Constitutional: Positive for fatigue. Negative for chills and fever.  Respiratory: Positive for shortness of breath. Negative for cough.   Cardiovascular: Negative for chest pain.  Gastrointestinal: Positive for blood in stool, nausea and vomiting. Negative for abdominal pain.  Skin: Positive for pallor.  Neurological: Positive for weakness (generalized) and light-headedness. Negative for syncope.  All other systems reviewed and are negative.    Physical Exam Updated Vital Signs BP 126/79    Pulse 74    Resp 12    Ht 5' (1.524 m)    Wt 56 kg    SpO2 96%    BMI 24.11 kg/m   Physical Exam Vitals signs and nursing note reviewed.  Constitutional:      General: She is not in acute distress.    Appearance: She is well-developed.  HENT:      Head: Normocephalic and atraumatic.  Eyes:     General:        Right eye: No discharge.        Left eye: No discharge.     Conjunctiva/sclera: Conjunctivae normal.  Neck:     Vascular: No JVD.     Trachea: No tracheal deviation.  Cardiovascular:     Rate and Rhythm: Normal rate and regular rhythm.  Pulmonary:     Effort: Pulmonary effort is normal.     Comments: Bibasilar crackles Abdominal:     General: Abdomen is flat. There is no distension.     Tenderness: There is no abdominal tenderness. There is no guarding or rebound.     Comments: Peritoneal dialysis site with no surrounding erythema   Wound VAC to the right groin  Genitourinary:    Comments: Examination performed in the presence of a chaperone.  Small external hemorrhoids noted, not thrombosed or tender.  No frank rectal bleeding, however upon insertion of a gloved finger into the rectum, the patient then began  To excrete thin black liquid from rectum.  Skin:    Coloration: Skin is pale.     Findings: No erythema.  Neurological:     Mental Status: She is alert.  Psychiatric:        Behavior: Behavior normal.      ED Treatments / Results  Labs (all labs ordered are listed, but only abnormal results are displayed) Labs Reviewed  CBC WITH DIFFERENTIAL/PLATELET - Abnormal; Notable for the following components:      Result Value   WBC 12.3 (*)    RBC 2.62 (*)    Hemoglobin 7.5 (*)    HCT 24.8 (*)    RDW 17.1 (*)    nRBC 0.8 (*)    Neutro Abs 10.0 (*)    Abs Immature Granulocytes 0.18 (*)    All other components within normal limits  BASIC METABOLIC PANEL - Abnormal; Notable for the following components:   Glucose, Bld 232 (*)    BUN 24 (*)    Creatinine, Ser 8.57 (*)    Calcium 8.0 (*)    GFR calc non Af Amer 5 (*)  GFR calc Af Amer 6 (*)    All other components within normal limits  POC OCCULT BLOOD, ED - Abnormal; Notable for the following components:   Fecal Occult Bld POSITIVE (*)    All  other components within normal limits  POCT I-STAT EG7 - Abnormal; Notable for the following components:   pO2, Ven 30.0 (*)    HCT 18.0 (*)    Hemoglobin 6.1 (*)    All other components within normal limits  PREPARE RBC (CROSSMATCH)  TYPE AND SCREEN    EKG None  Radiology Dg Chest Portable 1 View  Result Date: 07/31/2018 CLINICAL DATA:  Shortness of breath. EXAM: PORTABLE CHEST 1 VIEW COMPARISON:  07/26/2018 FINDINGS: Sternotomy wires unchanged. Lungs are adequately inflated demonstrate persistent moderate size left pleural effusion likely with associated left basilar atelectasis with possible slight interval worsening. Mild prominence of the left perihilar markings. Right lung centrally clear. Mild stable cardiomegaly. Remainder the exam is unchanged. IMPRESSION: Continued evidence of moderate size left pleural effusion likely with associated basilar atelectasis with slight interval worsening. Infection in the left base is possible. Stable cardiomegaly. Electronically Signed   By: Marin Olp M.D.   On: 07/31/2018 15:27    Procedures .Critical Care Performed by: Renita Papa, PA-C Authorized by: Renita Papa, PA-C   Critical care provider statement:    Critical care time (minutes):  35   Critical care was necessary to treat or prevent imminent or life-threatening deterioration of the following conditions:  Circulatory failure   Critical care was time spent personally by me on the following activities:  Discussions with consultants, evaluation of patient's response to treatment, examination of patient, ordering and performing treatments and interventions, ordering and review of laboratory studies, ordering and review of radiographic studies, pulse oximetry, re-evaluation of patient's condition, obtaining history from patient or surrogate and review of old charts   I assumed direction of critical care for this patient from another provider in my specialty: no     (including critical  care time)  Medications Ordered in ED Medications  0.9 %  sodium chloride infusion (Manually program via Guardrails IV Fluids) (has no administration in time range)  pantoprazole (PROTONIX) injection 40 mg (has no administration in time range)  sodium chloride 0.9 % bolus 1,000 mL (1,000 mLs Intravenous New Bag/Given 07/31/18 1411)     Initial Impression / Assessment and Plan / ED Course  I have reviewed the triage vital signs and the nursing notes.  Pertinent labs & imaging results that were available during my care of the patient were reviewed by me and considered in my medical decision making (see chart for details).        Patient presenting with ongoing and progressively worsening generalized weakness, fatigue, shortness of breath and lightheadedness with ambulation, and melanotic stools.  Also had an episode of hematemesis just prior to arrival.  Initially hypotensive, improved with fluid bolus.  She appears pale, chronically ill but not septic.  She has had a 2.5 g drop in her hemoglobin in 3 days, improving leukocytosis.  Elevated BUN and creatinine are at patient's baseline.  Hemoccult positive stools.  Will transfuse 2 units packed red blood cells. Dr. Jeanell Sparrow has spoken with [facility], who will follow the patient while she is in the hospital. Internal medicine teaching service to admit.   Final Clinical Impressions(s) / ED Diagnoses   Final diagnoses:  Symptomatic anemia    ED Discharge Orders    None  Renita Papa, PA-C 07/31/18 1547    Pattricia Boss, MD 08/03/18 [phone]

## 2018-07-31 NOTE — ED Notes (Signed)
ED Provider at bedside. 

## 2018-07-31 NOTE — Progress Notes (Signed)
Called unit and requested to patient's nurse, Elisa. She is unavailable at this time. Requested to have nurse return call to VAST team. Fran Lowes, RN VAST

## 2018-07-31 NOTE — Progress Notes (Signed)
[name] [id number]  Code Status: FULL  Admission Data: 07/31/2018 6:59 PM  Attending Provider: Daryll Drown  PCP:Sun, Mikeal Hawthorne, MD  Consults/ Treatment Team: Treatment Team:  Otis Brace, MD Deterding, Jeneen Rinks, MD Pccm, Md, MD  Priscilla [name] is a 48 y.o. female patient admitted from ED awake, alert - oriented X 4 - no acute distress noted. VSS - Blood pressure (!) 142/50, pulse 74, temperature (!) 97.5 F (36.4 C), temperature source Oral, resp. rate 15, height 5' (1.524 m), weight 56 kg, SpO2 94 %. no c/o shortness of breath, no c/o chest pain. Orientation to room, and floor completed with information packet given to patient/family. Admission INP armband ID verified with patient/family, and in place.  SR up x 2, fall assessment complete, with patient and family able to verbalize understanding of risk associated with falls, and verbalized understanding to call nsg before up out of bed. Call light within reach, patient able to voice, and demonstrate understanding. Skin, clean-dry- intact without evidence of bruising, or skin tears. Wound vac in place to right groin from previous admission. No evidence of skin break down noted on exam.  ?  Will cont to eval and treat per MD orders.  Melonie [place], RN  07/31/2018 6:59 PM

## 2018-08-01 DIAGNOSIS — D72829 Elevated white blood cell count, unspecified: Secondary | ICD-10-CM

## 2018-08-01 DIAGNOSIS — Z992 Dependence on renal dialysis: Secondary | ICD-10-CM

## 2018-08-01 DIAGNOSIS — Z952 Presence of prosthetic heart valve: Secondary | ICD-10-CM

## 2018-08-01 DIAGNOSIS — N186 End stage renal disease: Secondary | ICD-10-CM

## 2018-08-01 DIAGNOSIS — E1022 Type 1 diabetes mellitus with diabetic chronic kidney disease: Secondary | ICD-10-CM

## 2018-08-01 DIAGNOSIS — K250 Acute gastric ulcer with hemorrhage: Secondary | ICD-10-CM

## 2018-08-01 DIAGNOSIS — E1051 Type 1 diabetes mellitus with diabetic peripheral angiopathy without gangrene: Secondary | ICD-10-CM

## 2018-08-01 DIAGNOSIS — Z978 Presence of other specified devices: Secondary | ICD-10-CM

## 2018-08-01 DIAGNOSIS — Z951 Presence of aortocoronary bypass graft: Secondary | ICD-10-CM

## 2018-08-01 DIAGNOSIS — Z7982 Long term (current) use of aspirin: Secondary | ICD-10-CM

## 2018-08-01 DIAGNOSIS — I120 Hypertensive chronic kidney disease with stage 5 chronic kidney disease or end stage renal disease: Secondary | ICD-10-CM

## 2018-08-01 DIAGNOSIS — Z95820 Peripheral vascular angioplasty status with implants and grafts: Secondary | ICD-10-CM

## 2018-08-01 DIAGNOSIS — T86891 Other transplanted tissue failure: Secondary | ICD-10-CM

## 2018-08-01 DIAGNOSIS — D649 Anemia, unspecified: Secondary | ICD-10-CM

## 2018-08-01 DIAGNOSIS — K8689 Other specified diseases of pancreas: Secondary | ICD-10-CM

## 2018-08-01 DIAGNOSIS — Z9889 Other specified postprocedural states: Secondary | ICD-10-CM

## 2018-08-01 DIAGNOSIS — I255 Ischemic cardiomyopathy: Secondary | ICD-10-CM

## 2018-08-01 DIAGNOSIS — Z8774 Personal history of (corrected) congenital malformations of heart and circulatory system: Secondary | ICD-10-CM

## 2018-08-01 DIAGNOSIS — R011 Cardiac murmur, unspecified: Secondary | ICD-10-CM

## 2018-08-01 DIAGNOSIS — Z79899 Other long term (current) drug therapy: Secondary | ICD-10-CM

## 2018-08-01 LAB — CBC
HCT: 26.3 % — ABNORMAL LOW (ref 36.0–46.0)
HCT: 23.7 % — ABNORMAL LOW (ref 36.0–46.0)
Hemoglobin: 8.2 g/dL — ABNORMAL LOW (ref 12.0–15.0)
Hemoglobin: 7.4 g/dL — ABNORMAL LOW (ref 12.0–15.0)
MCH: 27.8 pg (ref 26.0–34.0)
MCH: 27.7 pg (ref 26.0–34.0)
MCHC: 31.2 g/dL (ref 30.0–36.0)
MCHC: 31.2 g/dL (ref 30.0–36.0)
MCV: 89.2 fL (ref 80.0–100.0)
MCV: 88.8 fL (ref 80.0–100.0)
Platelets: 201 K/uL (ref 150–400)
Platelets: 218 K/uL (ref 150–400)
RBC: 2.95 MIL/uL — ABNORMAL LOW (ref 3.87–5.11)
RBC: 2.67 MIL/uL — ABNORMAL LOW (ref 3.87–5.11)
RDW: 17.6 % — ABNORMAL HIGH (ref 11.5–15.5)
RDW: 17.6 % — ABNORMAL HIGH (ref 11.5–15.5)
WBC: 15.3 K/uL — ABNORMAL HIGH (ref 4.0–10.5)
WBC: 13.6 K/uL — ABNORMAL HIGH (ref 4.0–10.5)
nRBC: 0.5 % — ABNORMAL HIGH (ref 0.0–0.2)
nRBC: 0.5 % — ABNORMAL HIGH (ref 0.0–0.2)

## 2018-08-01 LAB — RENAL FUNCTION PANEL
Albumin: 1.4 g/dL — ABNORMAL LOW (ref 3.5–5.0)
Anion gap: 10 (ref 5–15)
BUN: 33 mg/dL — ABNORMAL HIGH (ref 6–20)
CO2: 25 mmol/L (ref 22–32)
Calcium: 8 mg/dL — ABNORMAL LOW (ref 8.9–10.3)
Chloride: 101 mmol/L (ref 98–111)
Creatinine, Ser: 7.92 mg/dL — ABNORMAL HIGH (ref 0.44–1.00)
GFR calc Af Amer: 6 mL/min — ABNORMAL LOW (ref >60)
GFR calc non Af Amer: 6 mL/min — ABNORMAL LOW (ref >60)
Glucose, Bld: 203 mg/dL — ABNORMAL HIGH (ref 70–99)
Phosphorus: 4.1 mg/dL (ref 2.5–4.6)
Potassium: 3.9 mmol/L (ref 3.5–5.1)
Sodium: 136 mmol/L (ref 135–145)

## 2018-08-01 LAB — GLUCOSE, CAPILLARY
Glucose-Capillary: 251 mg/dL — ABNORMAL HIGH (ref 70–99)
Glucose-Capillary: 212 mg/dL — ABNORMAL HIGH (ref 70–99)
Glucose-Capillary: 190 mg/dL — ABNORMAL HIGH (ref 70–99)
Glucose-Capillary: 166 mg/dL — ABNORMAL HIGH (ref 70–99)

## 2018-08-01 LAB — HEMOGLOBIN AND HEMATOCRIT, BLOOD
HCT: 32.1 % — ABNORMAL LOW (ref 36.0–46.0)
Hemoglobin: 10.6 g/dL — ABNORMAL LOW (ref 12.0–15.0)

## 2018-08-01 LAB — PREPARE RBC (CROSSMATCH)

## 2018-08-01 MED ORDER — PANCRELIPASE (LIP-PROT-AMYL) 12000-38000 UNITS PO CPEP
108000.0000 [IU] | ORAL_CAPSULE | Freq: Three times a day (TID) | ORAL | Status: DC
  Administered 2018-08-01 (×2): 108000 [IU] via ORAL
  Filled 2018-08-01 (×3): qty 9

## 2018-08-01 MED ORDER — SODIUM CHLORIDE 0.9% IV SOLUTION
Freq: Once | INTRAVENOUS | Status: AC
  Administered 2018-08-01: 12:00:00 via INTRAVENOUS

## 2018-08-01 MED ORDER — INSULIN NPH (HUMAN) (ISOPHANE) 100 UNIT/ML ~~LOC~~ SUSP
7.0000 [IU] | Freq: Every day | SUBCUTANEOUS | Status: DC
  Administered 2018-08-01: 7 [IU] via SUBCUTANEOUS
  Filled 2018-08-01: qty 10

## 2018-08-01 MED ORDER — LIP MEDEX EX OINT
1.0000 "application " | TOPICAL_OINTMENT | CUTANEOUS | Status: DC | PRN
  Filled 2018-08-01: qty 7

## 2018-08-01 MED ORDER — SODIUM CHLORIDE 0.9% IV SOLUTION: Freq: Once | INTRAVENOUS | Status: DC

## 2018-08-01 MED ORDER — HYDROMORPHONE HCL 1 MG/ML IJ SOLN
0.5000 mg | Freq: Once | INTRAMUSCULAR | Status: AC
  Administered 2018-08-01: 0.5 mg via INTRAVENOUS
  Filled 2018-08-01: qty 0.5

## 2018-08-01 MED ORDER — PANCRELIPASE (LIP-PROT-AMYL) 12000-38000 UNITS PO CPEP: 72000.0000 [IU] | ORAL_CAPSULE | ORAL | Status: DC

## 2018-08-01 MED ORDER — SODIUM CHLORIDE 0.9 % IV BOLUS
500.0000 mL | Freq: Once | INTRAVENOUS | Status: AC
  Administered 2018-08-01: 500 mL via INTRAVENOUS

## 2018-08-01 MED ORDER — PANCRELIPASE (LIP-PROT-AMYL) 12000-38000 UNITS PO CPEP: 72000.0000 [IU] | ORAL_CAPSULE | ORAL | Status: DC | PRN

## 2018-08-01 MED ORDER — INSULIN NPH (HUMAN) (ISOPHANE) 100 UNIT/ML ~~LOC~~ SUSP
5.0000 [IU] | Freq: Once | SUBCUTANEOUS | Status: AC
  Administered 2018-08-01: 5 [IU] via SUBCUTANEOUS
  Filled 2018-08-01: qty 10

## 2018-08-01 NOTE — Plan of Care (Signed)

## 2018-08-01 NOTE — Progress Notes (Addendum)
[facility] Progress Note   Subjective:  Seen in room. Feels better than yesterday. No further emesis episodes. Completed PD overnight. No issues.   Objective Vitals:   07/31/18 2055 08/01/18 0545 08/01/18 0747 08/01/18 1010  BP: (!) 136/52 (!) 124/40  (!) 106/36  Pulse: 79 87  85  Resp: 15 20  14   Temp: 98.2 F (36.8 C) 98.3 F (36.8 C) 98.4 F (36.9 C) 98.6 F (37 C)  TempSrc: Oral Oral Oral Oral  SpO2: 95% 99%  100%  Weight:    59 kg  Height:        Physical Exam General: WNWD female NAD  Heart: RRR systolic murmur  Lungs: CTAB Abdomen: BS+ soft NTND Extremities: No sig LE edema  Dialysis Access: PD cath in abd. dsg clean   Weight change:    Additional Objective Labs: Basic Metabolic Panel: Recent Labs  Lab 07/28/18 0514 07/31/18 1355 07/31/18 1428 08/01/18 0227  NA 135 135 136 136  K 3.6 3.7 3.5 3.9  CL 99 100  --  101  CO2 24 24  --  25  GLUCOSE 238* 232*  --  203*  BUN 21* 24*  --  33*  CREATININE 8.09* 8.57*  --  7.92*  CALCIUM 8.7* 8.0*  --  8.0*  PHOS  --   --   --  4.1   CBC: Recent Labs  Lab 07/26/18 1333 07/27/18 1106 07/28/18 0514 07/31/18 1355 07/31/18 1428 07/31/18 2212 08/01/18 0227  WBC 15.5* 13.1* 10.6* 12.3*  --  15.3* 13.6*  NEUTROABS 11.5*  --   --  10.0*  --   --   --   HGB 9.0* 8.3* 8.2* 7.5* 6.1* 8.2* 7.4*  HCT 28.9* 25.3* 26.0* 24.8* 18.0* 26.3* 23.7*  MCV 91.2 90.0 90.0 94.7  --  89.2 88.8  PLT 327 278 247 209  --  201 218   Blood Culture    Component Value Date/Time   SDES ABSCESS 07/19/2018 1127   SPECREQUEST Normal 07/19/2018 1127   CULT ABUNDANT KLEBSIELLA PNEUMONIAE 07/19/2018 1127   REPTSTATUS 07/21/2018 FINAL 07/19/2018 1127     Medications:  . sodium chloride   Intravenous Once  . sodium chloride   Intravenous Once  . sodium chloride   Intravenous Once  . darbepoetin (ARANESP) injection - NON-DIALYSIS  200 mcg Subcutaneous Q Sat-1800  . gentamicin cream  1 application Topical Daily   . insulin aspart  0-9 Units Subcutaneous TID WC  . pantoprazole (PROTONIX) IV  40 mg Intravenous Q12H    Dialysis Orders:  CCPD EDW 54 kg 6 total exchanges- 5 on the cycler 2 L fill vol 1 hr 30 min dwell time and 1 pause of 1500 mL, 4 hrs, dry day  Assessment/Plan: 1. Upper GIB/Hematemesis - recurrent. Hx GI ulcers. Transfused 1 u prbc so far. Hgb 8.2>7.4. Will get another unit today. Keep Hgb >8 with cardiac history.   Aranesp 200 ordered today 4/4 SQ. Completed course of IV Fe during last adm.  PPI per primary.  GI consult pending  2. ESRD - Continue CCPD nightly. Half 1.5 and 2.5  3. HTN/volume-  HTN meds on hold. BPs better. No sig volume excess on exam  4. Metabolic Bone Disease- continue calcitriol/binders  5. Nutrition - NPO at present. Cont prot supp for PCM when eating  6. CAD - hx CABG/MV repair  7. Right groin wound/PVD - s/p repair and placement of VAC  8. Hx pancreas transplant  9. DM2 -  insulin per primary    Lynnda Child PA-C [place] Pager [phone] 08/01/2018,11:03 AM  LOS: 1 day   Pt seen, examined and agree w A/P as above.  [facility] 08/01/2018, 3:31 PM

## 2018-08-01 NOTE — Progress Notes (Signed)
Inpatient Diabetes Program Recommendations  AACE/ADA: New Consensus Statement on Inpatient Glycemic Control (2015)  Target Ranges:  Prepandial:   less than 140 mg/dL      Peak postprandial:   less than 180 mg/dL (1-2 hours)      Critically ill patients:  140 - 180 mg/dL   Lab Results  Component Value Date   GLUCAP 251 (H) 08/01/2018   HGBA1C 11.0 (H) 06/30/2018    Review of Glycemic Control Results for Priscilla [name], Priscilla [name] (MRN [id number]) as of 08/01/2018 11:08  Ref. Range 08/01/2018 07:51  Glucose-Capillary Latest Ref Range: 70 - 99 mg/dL 251 (H)   Admit with: GIB  History: Type 1 Diabetes, Pancreas/Kidney Transplant, ESRD (gets PD at home)  Home DM Meds: NPH Insulin 10 units QHS                             Humalog 1-10 units TID  Current Orders: Novolog 0-9 units TID   MD-- Patient is a type 1 DM and needs basal insulin.  1. Start NPH 5 units x1 now.  2. Start NPH 7 units QHS tonight (70% total home dose to start)  Thanks, Bronson Curb, MSN, RNC-OB Diabetes Coordinator [phone] (8a-5p)

## 2018-08-01 NOTE — Progress Notes (Signed)
Subjective: [name] to stay was doing well today, no acute events overnight.  She reports that she is feeling better today. She reports that last night she was having a lot of pain in her left arm but that they took out that IV line from that site and that it's feeling better. She also reports that she had a reaction to the wipes that they used yesterday and that there was a lot of burning in that area, she states that it feels okay right now but is concerned about them using the wipes again. She had a bowel movement yesterday that was more brown with some small specks of black. She denies any nausea, vomiting, abdominal pain, shortness of breath of chest pain. We discussed that GI will be seeing her today, discussed the plan for today and she is in agreement.   Objective:  Vital signs in last 24 hours: Vitals:   07/31/18 1810 07/31/18 2019 07/31/18 2055 08/01/18 0545  BP: (!) 142/50 (!) 125/46 (!) 136/52 (!) 124/40  Pulse: 74 75 79 87  Resp: 15 16 15 20   Temp:  97.7 F (36.5 C) 98.2 F (36.8 C) 98.3 F (36.8 C)  TempSrc:  Oral Oral Oral  SpO2:  94% 95% 99%  Weight:  59 kg    Height:        General: Tired appearing female, NAD, resting comfortably Cardiac: RRR, systolic murmur Pulmonary: CTABL, no wheezing, rhonchi or rales Abdomen: Soft, non-tender, non-distended, normoactive bowel sounds Extremity:No LE edema, wound vac in place in right groin    Assessment/Plan:  Active Problems:   Acute GI bleeding  This is a 48 year old female with history of upper GI bleeding secondary to gastric ulcers presented with both hematemasis and melena.  She also noted increased fatigue, weakness, lightheadedness, shortness of breath, and nausea.  On exam she was noted to be fatigued appearing, with conjunctival pallor and decreased capillary refill.  Labs are significant for hemoglobin down to 6.1, she is still having dark stool while she is in the ED. Patient was transfused fluids and pRBC  and started on PPI.  Acute GI bleed Symptomatic anemia Today she reports that she is feeling better, she is more alert today and less fatigued appearing.  She denies any lightheadedness or dizziness today, denied any shortness of breath or nausea.  Her vitals have improved and blood pressures are ranging around 120s/40s, greatly improved from yesterday. Patient has received 1 unit of packed red blood cells and 1.5 L normal saline.  Hemoglobin improved to 8.2 after the first unit last night.  This morning her hemoglobin is 7.4, given that a decrease from yesterday she may still be having some bleeding.  Will transfuse 1 more unit today and continue to monitor. Etiology is possibly the known GI ulcer, she had not been taking her PPI which may have exacerbated the bleed. -GI consulted, will evaluate today -Keep NPO for now -Continue protonix 40 mg BID -Holding ASA -Transfuse 1 unit pRBC -F/u h/h following transfusion -Transfuse if Hgb <8  ESRD on PD: -Nephrology following, appreciate assistance -Dialysis per nephro  Diabetes type 1: Patient is on 10 units NPH insulin and humalog 1-10 units TID at home. She is currently NPO awaiting GI evaluation.   -Continue NPH 7 units  -SSI -Frequent CBGs.   Right groin wound vac: -Wound vac in place.  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  Blood pressures have been soft/hypotensive.  Continue to monitor. . FEN: No fluidsr, replete lytes prn, NPO VTE ppx: SCDs Code Status: FULL   Dispo: Anticipated discharge is pending clinical improvement.   Asencion Noble, MD 08/01/2018, 6:46 AM Pager:  [phone]

## 2018-08-01 NOTE — Consult Note (Signed)
Subjective:   HPI  The patient is a 48 year old female with multiple medical problems. She has been in the hospital several times over the last few weeks with gastrointestinal bleeding from an upper GI source. On March 15 she underwent EGD with findings of clot in the stomach in the region of the cardia. Follow-up EGD on March 16 showed a 4 cm gastric ulcer in the greater curvature treated with endoclips and Bicap cautery. Repeat EGD on March 22 showed the ulcer but no active bleeding. Last EGD was on March 31 showing the ulcer with no active bleeding. The patient was discharged from this hospital a few days ago but states that there was a mixup in her medications and she never took her Protonix twice a day after leaving the hospital. Yesterday morning she vomited some blood and was readmitted. Hemoglobin was 6.1 and hematocrit 18. Today hemoglobin 7.4 hematocrit 23.7 after 2 units of packed red cells. Patient states she feels fine today. Has not vomited since yesterday morning episodes     Past Medical History:  Diagnosis Date  . Anemia   . CAD (coronary artery disease)   . Cardiomyopathy, ischemic 06/24/2014  . Diabetes mellitus without complication ([place])   . End stage renal disease ([place])   . Foot drop, left foot   . GERD (gastroesophageal reflux disease)   . H/O kidney transplant   . Hyperlipidemia   . Hypertension   . Myocardial infarction ([place])   . Pancreas transplanted ([place])   . Renal disorder   . 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 S/P mitral valve repair 07/18/14   Past Surgical History:  Procedure Laterality Date  . ABDOMINAL AORTOGRAM W/LOWER EXTREMITY N/A 09/19/2017   Procedure: ABDOMINAL AORTOGRAM W/LOWER EXTREMITY;  Surgeon: Angelia Mould, MD;  Location: [place] [facility];  Service: Cardiovascular;  Laterality: N/A;  . CAPD INSERTION N/A 09/08/2013   Procedure: Laparoscopic CAPD peritoneal dialysis catheter placement, possible lysis of adhesions   ;  Surgeon: Adin Hector,  MD;  Location: [place];  Service: General;  Laterality: N/A;  . CARDIAC CATHETERIZATION     06/2014  . CESAREAN SECTION    . COMBINED KIDNEY-PANCREAS TRANSPLANT  05/2010   [facility]  Pancreas left mid abdomen  . CORONARY ARTERY BYPASS GRAFT N/A 07/18/2014   Procedure: CORONARY ARTERY BYPASS GRAFTING (CABG)X2 LIMA-LAD; SVG-PD;  Surgeon: Melrose Nakayama, MD;  Location: [place];  Service: Open Heart Surgery;  Laterality: N/A;  . ESOPHAGOGASTRODUODENOSCOPY (EGD) WITH PROPOFOL N/A 07/12/2018   Procedure: ESOPHAGOGASTRODUODENOSCOPY (EGD) WITH PROPOFOL;  Surgeon: Clarene Essex, MD;  Location: [place];  Service: Endoscopy;  Laterality: N/A;  . ESOPHAGOGASTRODUODENOSCOPY (EGD) WITH PROPOFOL N/A 07/13/2018   Procedure: ESOPHAGOGASTRODUODENOSCOPY (EGD) WITH PROPOFOL;  Surgeon: Ronnette Juniper, MD;  Location: [place];  Service: Gastroenterology;  Laterality: N/A;  . ESOPHAGOGASTRODUODENOSCOPY (EGD) WITH PROPOFOL N/A 07/19/2018   Procedure: ESOPHAGOGASTRODUODENOSCOPY (EGD) WITH PROPOFOL;  Surgeon: Ronnette Juniper, MD;  Location: [place];  Service: Gastroenterology;  Laterality: N/A;  . ESOPHAGOGASTRODUODENOSCOPY (EGD) WITH PROPOFOL N/A 07/28/2018   Procedure: ESOPHAGOGASTRODUODENOSCOPY (EGD) WITH PROPOFOL;  Surgeon: Otis Brace, MD;  Location: [facility];  Service: Gastroenterology;  Laterality: N/A;  . FEMORAL ARTERY EXPLORATION Right 07/07/2018   Procedure: REPAIR RIGHT COMMON FEMORAL ARTERY TO FEMORAL VEIN FISTULA  WITH VEIN PATCH ANGIOPLASTY;  Surgeon: Angelia Mould, MD;  Location: [place];  Service: Vascular;  Laterality: Right;  . GROIN DEBRIDEMENT Right 07/22/2018   Procedure: GROIN DEBRIDEMENT , POSSIBLE VAC;  Surgeon: Rosetta Posner, MD;  Location: [place];  Service: Vascular;  Laterality: Right;  . HEMOSTASIS CLIP PLACEMENT  07/13/2018   Procedure: HEMOSTASIS CLIP PLACEMENT;  Surgeon: Ronnette Juniper, MD;  Location: [place];  Service: Gastroenterology;;  . HOT HEMOSTASIS  N/A 07/13/2018   Procedure: HOT HEMOSTASIS (ARGON PLASMA COAGULATION/BICAP);  Surgeon: Ronnette Juniper, MD;  Location: [place];  Service: Gastroenterology;  Laterality: N/A;  . HOT HEMOSTASIS N/A 07/19/2018   Procedure: HOT HEMOSTASIS (ARGON PLASMA COAGULATION/BICAP);  Surgeon: Ronnette Juniper, MD;  Location: [place];  Service: Gastroenterology;  Laterality: N/A;  . INSERTION OF DIALYSIS CATHETER  September 28, 2012   Right upper chest  . LAPAROSCOPIC INSERTION PERITONEAL CATHETER  07/18/2008   Dr Excell Seltzer  . LAPAROSCOPIC REPOSITIONING CAPD CATHETER  04-28-2009   Dr Excell Seltzer  . LAPAROSCOPIC REPOSITIONING CAPD CATHETER  11/08/2008   Dr Excell Seltzer  . LAPAROSCOPIC REPOSITIONING CAPD CATHETER  10/03/2009   Unplugging CAPD catheter Dr Excell Seltzer  . LEFT HEART CATHETERIZATION WITH CORONARY ANGIOGRAM N/A 06/28/2014   Procedure: LEFT HEART CATHETERIZATION WITH CORONARY ANGIOGRAM;  Surgeon: [name], MD;  Location: [place] [facility];  Service: Cardiovascular;  Laterality: N/A;  . MITRAL VALVE REPAIR N/A 07/18/2014   Procedure: MITRAL VALVE REPAIR (MVR);  Surgeon: Melrose Nakayama, MD;  Location: [place];  Service: Open Heart Surgery;  Laterality: N/A;  . OMENTECTOMY  11/08/2008   Dr Excell Seltzer  . PERIPHERAL VASCULAR INTERVENTION Bilateral 09/19/2017   Procedure: PERIPHERAL VASCULAR INTERVENTION;  Surgeon: Angelia Mould, MD;  Location: [place] [facility];  Service: Cardiovascular;  Laterality: Bilateral;  Iliac stents  . SCLEROTHERAPY  07/13/2018   Procedure: Clide Deutscher;  Surgeon: Ronnette Juniper, MD;  Location: [facility];  Service: Gastroenterology;;  . SCLEROTHERAPY  07/19/2018   Procedure: Clide Deutscher;  Surgeon: Ronnette Juniper, MD;  Location: [place];  Service: Gastroenterology;;  . TEE WITHOUT CARDIOVERSION N/A 07/18/2014   Procedure: TRANSESOPHAGEAL ECHOCARDIOGRAM (TEE);  Surgeon: Melrose Nakayama, MD;  Location: [place];  Service: Open Heart Surgery;  Laterality: N/A;   Social History    Socioeconomic History  . Marital status: Divorced    Spouse name: Not on file  . Number of children: 1  . Years of education: Not on file  . Highest education level: Not on file  Occupational History  . 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 Financial resource strain: Not on file  . Food insecurity:    Worry: Not on file    Inability: Not on file  . Transportation needs:    Medical: Not on file    Non-medical: Not on file  Tobacco Use  . Smoking status: Never Smoker  . Smokeless tobacco: Never Used  Substance and Sexual Activity  . Alcohol use: No    Alcohol/week: 0.0 standard drinks  . Drug use: No  . Sexual activity: Not Currently    Birth control/protection: None  Lifestyle  . Physical activity:    Days per week: Not on file    Minutes per session: Not on file  . Stress: Not on file  Relationships  . Social connections:    Talks on phone: Not on file    Gets together: Not on file    Attends religious service: Not on file    Active member of club or organization: Not on file    Attends meetings of clubs or organizations: Not on file    Relationship status: Not on file  . Intimate partner violence:  Fear of current or ex partner: Not on file    Emotionally abused: Not on file    Physically abused: Not on file    Forced sexual activity: Not on file  Other Topics Concern  . Not on file  Social History Narrative  . Not on file   family history includes CAD in her mother; COPD in her father; Stroke in her mother.  Current Facility-Administered Medications:  .  0.9 %  sodium chloride infusion (Manually program via Guardrails IV Fluids), , Intravenous, Once, Ledell Noss, MD .  0.9 %  sodium chloride infusion (Manually program via Guardrails IV Fluids), , Intravenous, Once, Chundi, Vahini, MD .  0.9 %  sodium chloride infusion (Manually program via Guardrails IV Fluids), , Intravenous, Once, Chundi, Vahini, MD .  Darbepoetin Alfa (ARANESP)  injection 200 mcg, 200 mcg, Subcutaneous, Q Sat-1800, Deterding, James, MD .  gentamicin cream (GARAMYCIN) 0.1 % 1 application, 1 application, Topical, Daily, Deterding, James, MD, 1 application at 20/10/07 2032 .  heparin 1000 unit/ml injection 500 Units, 500 Units, Intraperitoneal, PRN, Deterding, James, MD .  insulin aspart (novoLOG) injection 0-9 Units, 0-9 Units, Subcutaneous, TID WC, Ledell Noss, MD, 5 Units at 08/01/18 [phone] .  insulin NPH Human (HUMULIN N,NOVOLIN N) injection 5 Units, 5 Units, Subcutaneous, Once, Krienke, Marissa M, MD .  insulin NPH Human (HUMULIN N,NOVOLIN N) injection 7 Units, 7 Units, Subcutaneous, QHS, Krienke, Marissa M, MD .  lipase/protease/amylase (CREON) capsule 108,000 Units, 108,000 Units, Oral, TID WC, Gilles Chiquito B, MD .  lipase/protease/amylase (CREON) capsule 72,000 Units, 72,000 Units, Oral, PRN, Gilles Chiquito B, MD .  pantoprazole (PROTONIX) injection 40 mg, 40 mg, Intravenous, Q12H, Ledell Noss, MD, 40 mg at 08/01/18 7588 Allergies  Allergen Reactions  . Pork-Derived Products Other (See Comments)    Patient is Muslim     Objective:     BP (!) 99/55   Pulse 85   Temp 98.1 F (36.7 C) (Oral)   Resp 14   Ht 5' (1.524 m)   Wt 59 kg   SpO2 100%   BMI 25.40 kg/m   No distress  Heart regular rhythm  Lungs clear  Abdomen soft and nontender  Laboratory No components found for: D1    Assessment:    large gastric ulcer.  Recurring GI bleed, appears stable at this time      Plan:     Continue PPI therapy. Given the fact that she has had four EGDs in the last couple of weeks I think we know what she is having bleeding from. I do not feel repeat endoscopy is needed right now provided she remains stable. Follow clinically. Transfuse blood as needed. Lab Results  Component Value Date   HGB 7.4 (L) 08/01/2018   HGB 8.2 (L) 07/31/2018   HGB 6.1 (LL) 07/31/2018   HCT 23.7 (L) 08/01/2018   HCT 26.3 (L) 07/31/2018   HCT 18.0 (L)  07/31/2018   ALKPHOS 305 (H) 07/26/2018   ALKPHOS 123 07/13/2018   ALKPHOS 228 (H) 07/12/2018   AST 44 (H) 07/26/2018   AST 25 07/13/2018   AST 22 07/12/2018   ALT <5 07/26/2018   ALT 8 07/13/2018   ALT 5 07/12/2018   AMYLASE 96 09/25/2012

## 2018-08-01 NOTE — Progress Notes (Signed)
  Date: 08/01/2018  Patient name: Priscilla [name]  Medical record number: [id number]  Date of birth: November 15, 1970   I have seen and evaluated Priscilla [name] and discussed their care with the Residency Team. Briefly, [name] is a 48 year old woman with a very complicated past medical history including renal failure, ICM with CABG, DM1, pancreatic insufficiency, PVD with bilateral iliac stents and HTN.  She has been admitted on multiple occasions over the last month after her iliac stenting procedure for GI bleeding. She has been found to have gastric and duodenal sources of bleeding.  She was last discharged on 07/28/18 in stable conditions with plans to take protonix at home.  Unfortunately, this did not appear to occur, she did not have it at home as she thought.  She subsequently developed 1 day of hematemesis, melena, lightheaded and dizziness.  She was also tachycardic.  She states that she feels better today.  Her H/H have recovered to 7.4.  GI was called in the ED and is planning to see her today.  She is on IV protonix Q 12 hours and now denies any further nausea or vomiting.    Vitals:   08/01/18 0545 08/01/18 0747  BP: (!) 124/40   Pulse: 87   Resp: 20   Temp: 98.3 F (36.8 C) 98.4 F (36.9 C)  SpO2: 99%    Physical Exam:  Gen: thin woman, appears older than stated age, lying in bed Eyes: + conjunctival pallor, EOMI, no injection HENT: Neck is supple, moist MM CV: RR, NR, no murmur noted Pulm: Breathing easily, no wheezing Abd: Soft, + TTP over epigastrium, peritoneal dialysis catheter in place in left lower abdomen is clean and dry.  Ext: She has a wound vac in place over the right groin at the site of a previous AVF Psych: Pleasant, but frustrated with frequent admission Neuro: Grossly intact, moving all extremities easily.   Assessment and Plan: I have seen and evaluated the patient as outlined above. I agree with the formulated Assessment and Plan as  detailed in the residents' note, with the following changes:   1. Upper GI bleeding, known gastric ulcers - PRBC transfusion, given cardiac history should have goal of 8 - Trend Hgb - GI consult - IV protonix - Hold aspirin (only on aspirin for iliac stents) - SCDs - BP has improved, continue to monitor  2. ESRD on PD - PD started, nephrology to manage  3. Leukocytosis - Chronic since her procedure in March.  She has had no fever, cough.  Neutrophil predominant, possibly related to infected graft, surgery and wound vac still in place - If fever, would pan culture.   Other issues per Dr. Dorothyann Peng daily notes.   Sid Falcon, MD 4/4/20209:40 AM

## 2018-08-02 DIAGNOSIS — E785 Hyperlipidemia, unspecified: Secondary | ICD-10-CM

## 2018-08-02 DIAGNOSIS — Z8619 Personal history of other infectious and parasitic diseases: Secondary | ICD-10-CM

## 2018-08-02 LAB — RENAL FUNCTION PANEL
Albumin: 1.5 g/dL — ABNORMAL LOW (ref 3.5–5.0)
Anion gap: 12 (ref 5–15)
BUN: 32 mg/dL — ABNORMAL HIGH (ref 6–20)
CO2: 23 mmol/L (ref 22–32)
Calcium: 7.8 mg/dL — ABNORMAL LOW (ref 8.9–10.3)
Chloride: 100 mmol/L (ref 98–111)
Creatinine, Ser: 7.41 mg/dL — ABNORMAL HIGH (ref 0.44–1.00)
GFR calc Af Amer: 7 mL/min — ABNORMAL LOW (ref >60)
GFR calc non Af Amer: 6 mL/min — ABNORMAL LOW (ref >60)
Glucose, Bld: 119 mg/dL — ABNORMAL HIGH (ref 70–99)
Phosphorus: 3.7 mg/dL (ref 2.5–4.6)
Potassium: 3.1 mmol/L — ABNORMAL LOW (ref 3.5–5.1)
Sodium: 135 mmol/L (ref 135–145)

## 2018-08-02 LAB — CBC
HCT: 30.7 % — ABNORMAL LOW (ref 36.0–46.0)
Hemoglobin: 10.2 g/dL — ABNORMAL LOW (ref 12.0–15.0)
MCH: 29.1 pg (ref 26.0–34.0)
MCHC: 33.2 g/dL (ref 30.0–36.0)
MCV: 87.7 fL (ref 80.0–100.0)
Platelets: 239 10*3/uL (ref 150–400)
RBC: 3.5 MIL/uL — ABNORMAL LOW (ref 3.87–5.11)
RDW: 17.3 % — ABNORMAL HIGH (ref 11.5–15.5)
WBC: 12.3 10*3/uL — ABNORMAL HIGH (ref 4.0–10.5)
nRBC: 0.4 % — ABNORMAL HIGH (ref 0.0–0.2)

## 2018-08-02 LAB — GLUCOSE, CAPILLARY
Glucose-Capillary: 99 mg/dL (ref 70–99)
Glucose-Capillary: 111 mg/dL — ABNORMAL HIGH (ref 70–99)
Glucose-Capillary: 84 mg/dL (ref 70–99)

## 2018-08-02 MED ORDER — CALCITRIOL 0.25 MCG PO CAPS
0.5000 ug | ORAL_CAPSULE | Freq: Every day | ORAL | Status: DC
  Administered 2018-08-02: 10:00:00 0.5 ug via ORAL
  Filled 2018-08-02: qty 2

## 2018-08-02 MED ORDER — INSULIN NPH (HUMAN) (ISOPHANE) 100 UNIT/ML ~~LOC~~ SUSP: 10.0000 [IU] | Freq: Every day | SUBCUTANEOUS | Status: DC

## 2018-08-02 MED ORDER — INSULIN NPH (HUMAN) (ISOPHANE) 100 UNIT/ML ~~LOC~~ SUSP: 7.0000 [IU] | Freq: Two times a day (BID) | SUBCUTANEOUS | Status: DC

## 2018-08-02 MED ORDER — PANTOPRAZOLE SODIUM 40 MG PO TBEC: 40.0000 mg | DELAYED_RELEASE_TABLET | Freq: Two times a day (BID) | ORAL | 1 refills | Status: DC

## 2018-08-02 NOTE — Progress Notes (Addendum)
[facility] Progress Note   Subjective:  Seen in room. No new events overnight. Hgb 10.2 s/p transfusion.  No further emesis. Tolerating PO.  No PD issues.   Objective Vitals:   08/02/18 0039 08/02/18 0240 08/02/18 0440 08/02/18 0745  BP: (!) 125/46 (!) 97/41 119/87   Pulse: 70 68 68   Resp: 10 15 12    Temp: 98 F (36.7 C) 97.8 F (36.6 C) 98 F (36.7 C) 97.6 F (36.4 C)  TempSrc: Oral Oral Oral Oral  SpO2: 100% 100% 99%   Weight:      Height:        Physical Exam General: WNWD female NAD  Heart: RRR systolic murmur  Lungs: CTAB Abdomen: BS+ soft NTND Extremities: No sig LE edema  Dialysis Access: PD cath in abd. dsg clean   Weight change: 3 kg   Additional Objective Labs: Basic Metabolic Panel: Recent Labs  Lab 07/31/18 1355 07/31/18 1428 08/01/18 0227 08/02/18 0416  NA 135 136 136 135  K 3.7 3.5 3.9 3.1*  CL 100  --  101 100  CO2 24  --  25 23  GLUCOSE 232*  --  203* 119*  BUN 24*  --  33* 32*  CREATININE 8.57*  --  7.92* 7.41*  CALCIUM 8.0*  --  8.0* 7.8*  PHOS  --   --  4.1 3.7   CBC: Recent Labs  Lab 07/26/18 1333  07/28/18 0514 07/31/18 1355  07/31/18 2212 08/01/18 0227 08/01/18 1656 08/02/18 0416  WBC 15.5*   < > 10.6* 12.3*  --  15.3* 13.6*  --  12.3*  NEUTROABS 11.5*  --   --  10.0*  --   --   --   --   --   HGB 9.0*   < > 8.2* 7.5*   < > 8.2* 7.4* 10.6* 10.2*  HCT 28.9*   < > 26.0* 24.8*   < > 26.3* 23.7* 32.1* 30.7*  MCV 91.2   < > 90.0 94.7  --  89.2 88.8  --  87.7  PLT 327   < > 247 209  --  201 218  --  239   < > = values in this interval not displayed.   Blood Culture    Component Value Date/Time   SDES ABSCESS 07/19/2018 1127   SPECREQUEST Normal 07/19/2018 1127   CULT ABUNDANT KLEBSIELLA PNEUMONIAE 07/19/2018 1127   REPTSTATUS 07/21/2018 FINAL 07/19/2018 1127     Medications:  . sodium chloride   Intravenous Once  . sodium chloride   Intravenous Once  . darbepoetin (ARANESP) injection - NON-DIALYSIS   200 mcg Subcutaneous Q Sat-1800  . gentamicin cream  1 application Topical Daily  . insulin aspart  0-9 Units Subcutaneous TID WC  . insulin NPH Human  10 Units Subcutaneous QAC breakfast  . lipase/protease/amylase  108,000 Units Oral TID WC  . pantoprazole (PROTONIX) IV  40 mg Intravenous Q12H    Dialysis Orders:  CCPD EDW 54 kg 6 total exchanges- 5 on the cycler 2 L fill vol 1 hr 30 min dwell time and 1 pause of 1500 mL, 4 hrs, dry day  Assessment/Plan: 1. Upper GIB/Hematemesis - recurrent. Hx GI ulcers. Transfused 2 u prbcs 4/4. Hgb 10.2 this am.  Keep Hgb >8 with cardiac history.    Aranesp 200 dosed 4/4 SQ. Completed course of IV Fe during last adm.  PPI per primary.  No further intervention this adm per GI.  2. ESRD -  Continue CCPD nightly. Half 1.5 and 2.5   3. Hypokalemia - Resume home KCl supplement.   4. HTN/volume-  HTN meds on hold. BPs better. No sig volume excess on exam   5. Metabolic Bone Disease- Corr Ca 9.8 Phos 3.7.  Continue binders/caclitriol when eating.   6.  Nutrition - CL diet.  Cont prot supp for PCM when eating   7. CAD - hx CABG/MV repair   8. Right groin wound/PVD - s/p repair and placement of VAC   9.  Hx pancreas transplant   10. DM2 -insulin per primary   Stable for discharge from renal standpoint.     Lynnda Child PA-C [facility] Pager [phone] 08/02/2018,9:26 AM  LOS: 2 days   Pt seen, examined and agree w A/P as above.  [facility] 08/02/2018, 5:11 PM

## 2018-08-02 NOTE — Progress Notes (Signed)
  Date: 08/02/2018  Patient name: Priscilla [name]  Medical record number: [id number]  Date of birth: 01/01/1971   I have seen and evaluated this patient and I have discussed the plan of care with the house staff. Please see Dr. Thea Gist note for complete details. I concur with her findings and plan.   Sid Falcon, MD 08/02/2018, 1:44 PM

## 2018-08-02 NOTE — Progress Notes (Addendum)
   Subjective:   Priscilla [name] was seen resting comfortably in her bed this morning. She denies any abdominal pain, nausea, or vomiting. She stated that she noticed some dark colored stool. She has been able to move about her room without difficulty or dizziness.   Objective:  Vital signs in last 24 hours: Vitals:   08/01/18 1155 08/01/18 1541 08/01/18 1639 08/01/18 1700  BP:  137/86  90/69  Pulse:    65  Resp:  14  15  Temp: 98.4 F (36.9 C) 98.1 F (36.7 C) 97.9 F (36.6 C) 98.3 F (36.8 C)  TempSrc: Oral Oral Oral Oral  SpO2:    94%  Weight:    59.8 kg  Height:       Physical Exam  Constitutional: She appears well-developed and well-nourished. No distress.  HENT:  Head: Normocephalic and atraumatic.  Eyes: Conjunctivae are normal.  Cardiovascular: Normal rate, regular rhythm and normal heart sounds.  Respiratory: Effort normal and breath sounds normal. No respiratory distress. She has no wheezes.  GI: Soft. Bowel sounds are normal. She exhibits no distension. There is no abdominal tenderness.  Site of peritoneal dialysis is clear of discharge of erythema  Musculoskeletal:        General: No edema.  Neurological: She is alert.  Skin: She is not diaphoretic.  Psychiatric: She has a normal mood and affect. Her behavior is normal. Judgment and thought content normal.   Assessment/Plan:  [name] with ICM s/p CABG, pancreatic insuff, PVD s/p bilateral iliac stents, and hx of numerous hospitalizations for gi bleeding. Found to have gastric duodenal ulcers on prior EGD presented with hematemesis, melena, lightheadedness, and dizziness. Found to have hb of 7.5 on admission which further dropped to 6.1.  Acute GI Bleed Symptomatic anemia  The patient's hb improved to 10.6 after 2units prbc transfusion and has remained stable at 10.2. GI evaluatated patient and felt that bleed was secondary to gastric ulcer found on previous egds.   Patient's blood pressure  today 90-140/50-80s with most recent blood pressure reading of 125/54. Patient is stable for discharge today with close follow up with [facility] and GI.   -Continue pantoprazole 40mg  bid -Resume diet -Had course of IV Feraheme last week   ESRD on PD  -Appreciate nephrology following -continue ccpd nightly  -continue aranesp q2week  -Appreciate wound care following to manage right groin wound   DM type 1 Patient's blood glucose has been ranging 110-210s over the past 24 hours. Patient is on 10u NPH insulin and humalog 1-10u TID at home. Patient received total of 12u NPH yesterday.   -NPH 10u in am and ssi  Ischemic Cardiomyopathy s/p mitral valve repair  HTN Patient had prolonged qtc on telemetry today. Will repeat EKG prior to discharge.   -Holding home coreg, losartan, and clonidine  Pancreatic insufficiency  -Continue creon  Dispo: Anticipated discharge today.  Lars Mage, MD 08/02/2018, 7:03 AM Pager: [phone]

## 2018-08-02 NOTE — Discharge Summary (Signed)
Name: Priscilla [name] MRN: [id number] DOB: 1971/03/16 48 y.o. PCP: [name], MD  Date of Admission: 07/31/2018  1:27 PM Date of Discharge: 08/02/2018 Attending Physician: Sid Falcon, MD  Discharge Diagnosis: 1.  Acute GI bleed 2.  ESRD on PD 3.  DM type I 4.  History of pancreatic insufficiency 5.  HLD 6.  History of AVF right groin infection 7. Hx of Ischemic cardiomyopathy, severe MR s/p mitral valve repair, PFO s/p closure,HTN  Discharge Medications: Allergies as of 08/02/2018   No Active Allergies     Medication List    TAKE these medications   acetaminophen 325 MG tablet Commonly known as:  TYLENOL Take 650 mg by mouth every 4 (four) hours as needed for mild pain.   amitriptyline 10 MG tablet Commonly known as:  ELAVIL Take 10 mg by mouth at bedtime.   aspirin 81 MG chewable tablet Chew 1 tablet (81 mg total) by mouth daily.   calcitRIOL 0.5 MCG capsule Commonly known as:  ROCALTROL Take 0.5 mcg by mouth at bedtime.   calcium acetate 667 MG capsule Commonly known as:  PHOSLO Take 1,334-2,001 mg by mouth See admin instructions. Take 2,001 mg by mouth three times a day with meals and 1,334 mg with each snack   Creon 36000 UNITS Cpep capsule Generic drug:  lipase/protease/amylase Take 72,000-108,000 Units by mouth See admin instructions. Take 108,000 units by mouth three times a day with meals and 72,000 units with each snack   gabapentin 100 MG capsule Commonly known as:  NEURONTIN Take 100 mg by mouth 3 (three) times daily.   gentamicin cream 0.1 % Commonly known as:  GARAMYCIN Apply 1 application topically See admin instructions. Apply topically to dialysis site daily after showering   HYDROmorphone 2 MG tablet Commonly known as:  DILAUDID Take 1 tablet (2 mg total) by mouth every 6 (six) hours as needed for moderate pain or severe pain.   insulin lispro 100 UNIT/ML injection Commonly known as:  HUMALOG Inject 1-10 Units into the skin See  admin instructions. Inject 1-10 units into the skin three times a day before meals, per sliding scale   insulin NPH Human 100 UNIT/ML injection Commonly known as:  HUMULIN N,NOVOLIN N Inject 0.1 mLs (10 Units total) into the skin at bedtime.   levonorgestrel 20 MCG/24HR IUD Commonly known as:  MIRENA 1 each by Intrauterine route once. Implanted March 2018   loperamide 2 MG capsule Commonly known as:  IMODIUM Take 2 mg by mouth See admin instructions. Take 2 mg by mouth with every dose of Creon   multivitamin Tabs tablet Take 1 tablet by mouth at bedtime.   pantoprazole 40 MG tablet Commonly known as:  PROTONIX Take 1 tablet (40 mg total) by mouth 2 (two) times daily for 30 days. What changed:  when to take this   potassium chloride SA 20 MEQ tablet Commonly known as:  K-DUR,KLOR-CON Take 1 tablet (20 mEq total) by mouth daily. What changed:  when to take this   Travoprost (BAK Free) 0.004 % Soln ophthalmic solution Commonly known as:  TRAVATAN Place 1 drop into both eyes at bedtime.       Disposition and follow-up:   PriscillaPriscilla [name] was discharged from [facility] in Stable condition.  At the hospital follow up visit please address:  1.  Acute GI bleed: Please make sure that she is follows up with gastroenterology and that she is taking her pantoprazole 40 mg twice daily  for 30 days.  She was restarted on her aspirin.  Please recheck her labs and monitor for symptoms.   ESRD on PD: Please make sure that she is following with nephrology and that she has everything for her home PD.   DM type 1: She should be getting her insulin pump soon.   Hx of AVF wound infection: Please make sure that she has follow up with vascular surgery and assess wound area.   2.  Labs / imaging needed at time of follow-up: CBC, BMP  3.  Pending labs/ test needing follow-up: None  Follow-up Appointments: Follow-up Information    Croitoru, Mihai, MD .   Specialty:   Cardiology Contact information: [address] [place] [address] [phone]        [place]. Go on 08/04/2018.   Why:  10:45am Contact information: [address] [place] [facility] Course by problem list: 1.  Acute GI bleed: This 48 year old female with history of upper GI bleeding secondary to gastric ulcers who was recently admitted for GI bleeding presented with a 1 day history of hematemesis and melena.  Associated with lightheadedness, dizziness, shortness of breath, fatigue and generalized weakness.  She had not been able to take her pantoprazole that she was discharged home on.  Her hemoglobin was 6.1, down from 9. She did become hypotensive however this improved after 2 units of packed red blood cells and fluid resuscitation. She had recently underwent multiple EGDs, EGD on 3/16 showed 4 cm gastric ulcer in greater cureveture treated with endoclips and cautery. Repeat EGD on3/22 and 3/31 showed the ulcer with no acute bleeding. GI was consulted and since she was stablized and had the recent EGDs they did not feel that repeat endoscopy was needed. Hgb stablized to 10.6 after 2 units pRBC and remained stable, Bps were stabilized. ASA was restarted on discharge. A prescription for pantoprazole was sent to her pharmacy and she will follow up in clinic and with GI. She will need repeat endoscopy in 2 months.    2.  ESRD on PD: She had been doing her PD at home, nephorlogy was consulted while she was here and PD nightly was continued.   3.  DM type I: Patient was on 10 unit NPH insulin and SSI while admitted. Home medications were resumed on discharge.   4.  History of pancreatic insufficiency: Creon was continued while she was admitted.  5.  HLD: Continued home meds on discharge. 6.  History of AVF right groin infection: She had a wound vac in place, no active infection seen. wound care was  consulted but was unable to see prior to discharge. She should have home health wound care.  7. Hx of Ischemic cardiomyopathy, severe MR s/p mitral valve repair, PFO s/p closure,HTN: Her home coreg, losartan, and clonidine was discontinued on prior admission. Nothing was restarted on discharge.  Discharge Vitals:   BP 106/68 (BP Location: Left Arm)   Pulse 65   Temp 97.9 F (36.6 C) (Oral)   Resp 16   Ht 5' (1.524 m)   Wt 59.8 kg   SpO2 98%   BMI 25.75 kg/m   Pertinent Labs, Studies, and Procedures:  CBC Latest Ref Rng & Units 08/02/2018 08/01/2018 08/01/2018  WBC 4.0 - 10.5 K/uL 12.3(H) - 13.6(H)  Hemoglobin 12.0 - 15.0 g/dL 10.2(L) 10.6(L) 7.4(L)  Hematocrit 36.0 - 46.0 % 30.7(L)  32.1(L) 23.7(L)  Platelets 150 - 400 K/uL 239 - 218   BMP Latest Ref Rng & Units 08/02/2018 08/01/2018 07/31/2018  Glucose 70 - 99 mg/dL 119(H) 203(H) -  BUN 6 - 20 mg/dL 32(H) 33(H) -  Creatinine 0.44 - 1.00 mg/dL 7.41(H) 7.92(H) -  Sodium 135 - 145 mmol/L 135 136 136  Potassium 3.5 - 5.1 mmol/L 3.1(L) 3.9 3.5  Chloride 98 - 111 mmol/L 100 101 -  CO2 22 - 32 mmol/L 23 25 -  Calcium 8.9 - 10.3 mg/dL 7.8(L) 8.0(L) -     Discharge Instructions: Discharge Instructions    Call MD for:  difficulty breathing, headache or visual disturbances   Complete by:  As directed    Call MD for:  persistant dizziness or light-headedness   Complete by:  As directed    Call MD for:  persistant nausea and vomiting   Complete by:  As directed    Diet - low sodium heart healthy   Complete by:  As directed    Discharge instructions   Complete by:  As directed    It was a pleasure to take care of you Priscilla [name]. During this hospitalization you received a blood transfusion. Please make sure to take pantoprazole twice daily. Please follow up for your internal medicine appointment. Thanks!   Increase activity slowly   Complete by:  As directed       Signed: Asencion Noble, MD 08/03/2018, 10:22 AM   Pager: Pager:  [phone]

## 2018-08-02 NOTE — Progress Notes (Signed)
Pt given discharge instructions, prescriptions, and care notes. Pt verbalized understanding AEB no further questions or concerns at this time. IV was discontinued, no redness, pain, or swelling noted at this time. Telemetry discontinued and Centralized Telemetry was notified. Pt left the floor via wheelchair with staff in stable condition. 

## 2018-08-04 ENCOUNTER — Telehealth (HOSPITAL_COMMUNITY): Payer: Self-pay | Admitting: Rehabilitation

## 2018-08-04 ENCOUNTER — Other Ambulatory Visit: Payer: Self-pay

## 2018-08-04 ENCOUNTER — Ambulatory Visit (INDEPENDENT_AMBULATORY_CARE_PROVIDER_SITE_OTHER): Payer: Medicare Other | Admitting: Internal Medicine

## 2018-08-04 ENCOUNTER — Encounter: Payer: Self-pay | Admitting: Internal Medicine

## 2018-08-04 VITALS — BP 120/54 | HR 75 | Temp 98.4°F | Wt 129.7 lb

## 2018-08-04 DIAGNOSIS — Z992 Dependence on renal dialysis: Secondary | ICD-10-CM | POA: Diagnosis not present

## 2018-08-04 DIAGNOSIS — Z9889 Other specified postprocedural states: Secondary | ICD-10-CM | POA: Diagnosis not present

## 2018-08-04 DIAGNOSIS — T8611 Kidney transplant rejection: Secondary | ICD-10-CM

## 2018-08-04 DIAGNOSIS — E1022 Type 1 diabetes mellitus with diabetic chronic kidney disease: Secondary | ICD-10-CM

## 2018-08-04 DIAGNOSIS — K922 Gastrointestinal hemorrhage, unspecified: Secondary | ICD-10-CM | POA: Diagnosis present

## 2018-08-04 DIAGNOSIS — T827XXD Infection and inflammatory reaction due to other cardiac and vascular devices, implants and grafts, subsequent encounter: Secondary | ICD-10-CM

## 2018-08-04 DIAGNOSIS — T86890 Other transplanted tissue rejection: Secondary | ICD-10-CM

## 2018-08-04 DIAGNOSIS — N186 End stage renal disease: Secondary | ICD-10-CM | POA: Diagnosis not present

## 2018-08-04 DIAGNOSIS — K279 Peptic ulcer, site unspecified, unspecified as acute or chronic, without hemorrhage or perforation: Secondary | ICD-10-CM | POA: Diagnosis not present

## 2018-08-04 DIAGNOSIS — Z7982 Long term (current) use of aspirin: Secondary | ICD-10-CM

## 2018-08-04 DIAGNOSIS — Z79899 Other long term (current) drug therapy: Secondary | ICD-10-CM | POA: Diagnosis not present

## 2018-08-04 LAB — BPAM RBC
Blood Product Expiration Date: 202004232359
Blood Product Expiration Date: 202004232359
Blood Product Expiration Date: 202004232359
ISSUE DATE / TIME: 202004031628
ISSUE DATE / TIME: 202004040110
ISSUE DATE / TIME: 202004041124
Unit Type and Rh: 5100
Unit Type and Rh: 5100
Unit Type and Rh: 5100

## 2018-08-04 LAB — CBC WITH DIFFERENTIAL/PLATELET
Abs Immature Granulocytes: 0.09 10*3/uL — ABNORMAL HIGH (ref 0.00–0.07)
Basophils Absolute: 0.1 10*3/uL (ref 0.0–0.1)
Basophils Relative: 1 %
Eosinophils Absolute: 0.3 10*3/uL (ref 0.0–0.5)
Eosinophils Relative: 3 %
HCT: 33.4 % — ABNORMAL LOW (ref 36.0–46.0)
Hemoglobin: 10.2 g/dL — ABNORMAL LOW (ref 12.0–15.0)
Immature Granulocytes: 1 %
Lymphocytes Relative: 8 %
Lymphs Abs: 0.8 10*3/uL (ref 0.7–4.0)
MCH: 27.9 pg (ref 26.0–34.0)
MCHC: 30.5 g/dL (ref 30.0–36.0)
MCV: 91.5 fL (ref 80.0–100.0)
Monocytes Absolute: 0.6 10*3/uL (ref 0.1–1.0)
Monocytes Relative: 6 %
Neutro Abs: 8.2 10*3/uL — ABNORMAL HIGH (ref 1.7–7.7)
Neutrophils Relative %: 81 %
Platelets: 242 10*3/uL (ref 150–400)
RBC: 3.65 MIL/uL — ABNORMAL LOW (ref 3.87–5.11)
RDW: 16.7 % — ABNORMAL HIGH (ref 11.5–15.5)
WBC: 10 10*3/uL (ref 4.0–10.5)
nRBC: 0.3 % — ABNORMAL HIGH (ref 0.0–0.2)

## 2018-08-04 LAB — TYPE AND SCREEN
ABO/RH(D): O POS
Antibody Screen: NEGATIVE
Unit division: 0
Unit division: 0
Unit division: 0

## 2018-08-04 NOTE — Assessment & Plan Note (Signed)
HPI: Patient with end-stage renal secondary to uncontrolled type I diabetes mellitus. She developed end-stage renal in 2010 and subsequently did two years of dialysis before getting pancreatic and renal transplant 2012. Her transplant subsequently failed in 2014 and she resumed dialysis in 2015. She is on peritoneal dialysis and doesn't seven days a week. She continues to follow with nephrology.  A/P: - Continue to follow with nephrology.

## 2018-08-04 NOTE — Progress Notes (Signed)
CC: F/u DM, ESRD, GI bleed  HPI:  [name] is a 48 y.o. female with PMHx listed below presenting for F/u DM, ESRD, GI bleed. Please see the A&P for the status of the patient's chronic medical problems.  Past Medical History:  Diagnosis Date  . Anemia   . CAD (coronary artery disease)   . Cardiomyopathy, ischemic 06/24/2014  . Diabetes mellitus without complication ([place])   . DKA (diabetic ketoacidoses) ([place]) 05/14/2016  . End stage renal disease ([place])   . Foot drop, left foot   . GERD (gastroesophageal reflux disease)   . H/O kidney transplant   . Hyperlipidemia   . Hypertension   . Myocardial infarction ([place])   . Pancreas transplanted ([place])   . Renal disorder   . 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 S/P mitral valve repair 07/18/14   Family History  Problem Relation Age of Onset  . CAD Mother   . Stroke Mother   . Diabetes Mother   . COPD Father   . Diabetes Sister   . Chronic Renal Failure Sister   . Diabetes Brother   . Colon cancer Neg Hx    Past Surgical History:  Procedure Laterality Date  . ABDOMINAL AORTOGRAM W/LOWER EXTREMITY N/A 09/19/2017   Procedure: ABDOMINAL AORTOGRAM W/LOWER EXTREMITY;  Surgeon: Angelia Mould, MD;  Location: [place] [facility];  Service: Cardiovascular;  Laterality: N/A;  . CAPD INSERTION N/A 09/08/2013   Procedure: Laparoscopic CAPD peritoneal dialysis catheter placement, possible lysis of adhesions   ;  Surgeon: Adin Hector, MD;  Location: [place];  Service: General;  Laterality: N/A;  . CARDIAC CATHETERIZATION     06/2014  . CESAREAN SECTION    . COMBINED KIDNEY-PANCREAS TRANSPLANT  05/2010   [facility]  Pancreas left mid abdomen  . CORONARY ARTERY BYPASS GRAFT N/A 07/18/2014   Procedure: CORONARY ARTERY BYPASS GRAFTING (CABG)X2 LIMA-LAD; SVG-PD;  Surgeon: Melrose Nakayama, MD;  Location: [place];  Service: Open Heart Surgery;  Laterality: N/A;  . ESOPHAGOGASTRODUODENOSCOPY (EGD) WITH PROPOFOL N/A  07/12/2018   Procedure: ESOPHAGOGASTRODUODENOSCOPY (EGD) WITH PROPOFOL;  Surgeon: Clarene Essex, MD;  Location: [place];  Service: Endoscopy;  Laterality: N/A;  . ESOPHAGOGASTRODUODENOSCOPY (EGD) WITH PROPOFOL N/A 07/13/2018   Procedure: ESOPHAGOGASTRODUODENOSCOPY (EGD) WITH PROPOFOL;  Surgeon: Ronnette Juniper, MD;  Location: [place];  Service: Gastroenterology;  Laterality: N/A;  . ESOPHAGOGASTRODUODENOSCOPY (EGD) WITH PROPOFOL N/A 07/19/2018   Procedure: ESOPHAGOGASTRODUODENOSCOPY (EGD) WITH PROPOFOL;  Surgeon: Ronnette Juniper, MD;  Location: [place];  Service: Gastroenterology;  Laterality: N/A;  . ESOPHAGOGASTRODUODENOSCOPY (EGD) WITH PROPOFOL N/A 07/28/2018   Procedure: ESOPHAGOGASTRODUODENOSCOPY (EGD) WITH PROPOFOL;  Surgeon: Otis Brace, MD;  [facility];  Service: Gastroenterology;  Laterality: N/A;  . FEMORAL ARTERY EXPLORATION Right 07/07/2018   Procedure: REPAIR RIGHT COMMON FEMORAL ARTERY TO FEMORAL VEIN FISTULA  WITH VEIN PATCH ANGIOPLASTY;  Surgeon: Angelia Mould, MD;  Location: [place];  Service: Vascular;  Laterality: Right;  . GROIN DEBRIDEMENT Right 07/22/2018   Procedure: GROIN DEBRIDEMENT , POSSIBLE VAC;  Surgeon: Rosetta Posner, MD;  Location: [place];  Service: Vascular;  Laterality: Right;  . HEMOSTASIS CLIP PLACEMENT  07/13/2018   Procedure: HEMOSTASIS CLIP PLACEMENT;  Surgeon: Ronnette Juniper, MD;  Location: [place];  Service: Gastroenterology;;  . HOT HEMOSTASIS N/A 07/13/2018   Procedure: HOT HEMOSTASIS (ARGON PLASMA COAGULATION/BICAP);  Surgeon: Ronnette Juniper, MD;  Location: [place];  Service: Gastroenterology;  Laterality: N/A;  . HOT HEMOSTASIS N/A 07/19/2018  Procedure: HOT HEMOSTASIS (ARGON PLASMA COAGULATION/BICAP);  Surgeon: Ronnette Juniper, MD;  Location: [place];  Service: Gastroenterology;  Laterality: N/A;  . INSERTION OF DIALYSIS CATHETER  September 28, 2012   Right upper chest  . LAPAROSCOPIC INSERTION PERITONEAL CATHETER  07/18/2008   Dr  Excell Seltzer  . LAPAROSCOPIC REPOSITIONING CAPD CATHETER  11-18-2008   Dr Excell Seltzer  . LAPAROSCOPIC REPOSITIONING CAPD CATHETER  11/08/2008   Dr Excell Seltzer  . LAPAROSCOPIC REPOSITIONING CAPD CATHETER  10/03/2009   Unplugging CAPD catheter Dr Excell Seltzer  . LEFT HEART CATHETERIZATION WITH CORONARY ANGIOGRAM N/A 06/28/2014   Procedure: LEFT HEART CATHETERIZATION WITH CORONARY ANGIOGRAM;  Surgeon: [name], MD;  Location: [place] [facility];  Service: Cardiovascular;  Laterality: N/A;  . MITRAL VALVE REPAIR N/A 07/18/2014   Procedure: MITRAL VALVE REPAIR (MVR);  Surgeon: Melrose Nakayama, MD;  Location: [place];  Service: Open Heart Surgery;  Laterality: N/A;  . OMENTECTOMY  11/08/2008   Dr Excell Seltzer  . PERIPHERAL VASCULAR INTERVENTION Bilateral 09/19/2017   Procedure: PERIPHERAL VASCULAR INTERVENTION;  Surgeon: Angelia Mould, MD;  Location: [place] [facility];  Service: Cardiovascular;  Laterality: Bilateral;  Iliac stents  . SCLEROTHERAPY  07/13/2018   Procedure: Clide Deutscher;  Surgeon: Ronnette Juniper, MD;  Location: [facility];  Service: Gastroenterology;;  . SCLEROTHERAPY  07/19/2018   Procedure: Clide Deutscher;  Surgeon: Ronnette Juniper, MD;  Location: [place];  Service: Gastroenterology;;  . TEE WITHOUT CARDIOVERSION N/A 07/18/2014   Procedure: TRANSESOPHAGEAL ECHOCARDIOGRAM (TEE);  Surgeon: Melrose Nakayama, MD;  Location: [place];  Service: Open Heart Surgery;  Laterality: N/A;   Social Hx: Originally from [place]. Moved to [place] after high school, denies the use of EtOH, tobacco, illicit substances.   Review of Systems: Performed and all others negative.  Physical Exam: Vitals:   08/04/18 1049  BP: (!) 120/54  Pulse: 75  Weight: 129 lb 11.2 oz (58.8 kg)   General: Well nourished female in no acute distress HENT: Normocephalic, atraumatic, moist mucus membranes Pulm: Good air movement with no wheezing or crackles  CV: RRR, fixed split S2 Abdomen: Active bowel sounds, soft,  non-distended, no tenderness to palpation  Extremities: Pulses palpable in all extremities, no LE edema  Skin: Warm and dry, ecchymosis on the LUE  Neuro: Alert and oriented x 3  Assessment & Plan:   See Encounters Tab for problem based charting.  Patient discussed with Dr. Beryle Beams

## 2018-08-04 NOTE — Patient Instructions (Signed)
Thank you for allowing [place] to provide your care. Today we are going to check you hemoglobin level. If you need a refill on your medications please do not hesitate to give [place] a call.   We will see you back in 3 months or sooner if anything arises.

## 2018-08-04 NOTE — Progress Notes (Signed)
Medicine attending: Medical history, presenting problems, physical findings, and medications, reviewed with resident physician Dr Ina Homes on the day of the patient visit and I concur with his evaluation and management plan. Recent UGI bleed x 2. Discharged from hospital 2 days ago. Still reports tarry stools. Hemodynamically stable. Check CBC today. Continue PPI.

## 2018-08-04 NOTE — Assessment & Plan Note (Signed)
HPI: Patient has long-standing type I diabetes mellitus. She is status post renal and pancreatic transplant subsequently failed in 2014. She follows with endocrinology and is planning on getting an insulin pump. She is hopeful that this will help her to control her diabetes better.  A/P: - Continue to follow with Endocrinology

## 2018-08-04 NOTE — Assessment & Plan Note (Signed)
HPI: Patient recently admitted for an upper G.I. bleed. During that time she had significant amount of diarrhea that infected her AVF that was recently repaired by vascular surgery. She subsequently underwent debridement and placement of wound back. Since leaving the hospital her home health aide has been helping her to manage her wound back. Her pain is well controlled on hydromorphone. She plans to follow-up with vascular surgery tomorrow.  A/P: - Follow-up with vascular surgery on 08/05/2018 - Agree with hydromorphone for pain management given her ESRD

## 2018-08-04 NOTE — Telephone Encounter (Signed)
The above patient or their representative was contacted and gave the following answers to these questions:         Do you have any of the following symptoms? No  Fever                    Cough                   Shortness of breath  Do  you have any of the following other symptoms? No    muscle pain         vomiting,        diarrhea        rash         weakness        red eye        abdominal pain         bruising          bruising or bleeding              joint pain           severe headache    Have you been in contact with someone who was or has been sick in the past 2 weeks? No  Yes                 Unsure                         Unable to assess   Does the person that you were in contact with have any of the following symptoms? N/A  Cough         shortness of breath           muscle pain         vomiting,            diarrhea            rash            weakness           fever            red eye           abdominal pain           bruising  or  bleeding                joint pain                severe headache               Have you  or someone you have been in contact with traveled internationally in th last month? No        If yes, which countries? N/A   Have you  or someone you have been in contact with traveled outside [place] in th last month?  No       If yes, which state and city? N/A   COMMENTS OR ACTION PLAN FOR THIS PATIENT:          

## 2018-08-04 NOTE — Assessment & Plan Note (Signed)
HPI: Patient recently admitted for an upper G.I. bleed times two secondary to an ulcer. She was stabilized after two units of packed RBCs. She was discharged on pantoprazole 40 mg BID. Since leaving the hospital she has been able to take her pantoprazole as prescribed. She is planning to follow up with G.I. in the near future. She continues to have dark tarry stools. She is not had any more hypotension. She is not had any more hematemesis.  A/P: - Discussed that her dark tarry stools will take some time to clear. - Will check CBC today to ensure hemoglobin is stable.

## 2018-08-05 ENCOUNTER — Ambulatory Visit (INDEPENDENT_AMBULATORY_CARE_PROVIDER_SITE_OTHER): Payer: Self-pay | Admitting: Vascular Surgery

## 2018-08-05 ENCOUNTER — Encounter: Payer: Self-pay | Admitting: Vascular Surgery

## 2018-08-05 VITALS — BP 141/56 | HR 77 | Temp 97.9°F | Resp 18 | Ht 60.0 in | Wt 129.0 lb

## 2018-08-05 DIAGNOSIS — Z48812 Encounter for surgical aftercare following surgery on the circulatory system: Secondary | ICD-10-CM

## 2018-08-05 DIAGNOSIS — I770 Arteriovenous fistula, acquired: Secondary | ICD-10-CM

## 2018-08-05 NOTE — Progress Notes (Signed)
Patient name: Ariauna Farabee MRN: [id number] DOB: 1970/09/08 Sex: female  REASON FOR VISIT:   Follow-up after repair of AV fistula.  HPI:   Marika [name] is a pleasant 48 y.o. female who had undergone a previous heart cath and was subsequently found to have an AV fistula in the right groin.  Initially this was asymptomatic, but she subsequently developed a wound on her foot and I felt that addressing the fistula was necessary to facilitate healing of the wound.  She underwent repair of the fistula on 07/07/2018.  She originally did well but then later presented with some breakdown of her incision.  Dr. Donnetta Hutching took her to the operating room on 07/22/2018 and debrided some fat necrosis and placed a VAC.  She comes in for an outpatient visit.  She has no specific complaints except that she is continuing to have some pain in the groin from this wound.  Current Outpatient Medications  Medication Sig Dispense Refill  . acetaminophen (TYLENOL) 325 MG tablet Take 650 mg by mouth every 4 (four) hours as needed for mild pain.    Marland Kitchen amitriptyline (ELAVIL) 10 MG tablet Take 10 mg by mouth at bedtime.   3  . aspirin 81 MG chewable tablet Chew 1 tablet (81 mg total) by mouth daily. 30 tablet 0  . calcitRIOL (ROCALTROL) 0.5 MCG capsule Take 0.5 mcg by mouth at bedtime.     . calcium acetate (PHOSLO) 667 MG capsule Take 1,334-2,001 mg by mouth See admin instructions. Take 2,001 mg by mouth three times a day with meals and 1,334 mg with each snack    . gabapentin (NEURONTIN) 100 MG capsule Take 100 mg by mouth 3 (three) times daily.    Marland Kitchen gentamicin cream (GARAMYCIN) 0.1 % Apply 1 application topically See admin instructions. Apply topically to dialysis site daily after showering    . HYDROmorphone (DILAUDID) 2 MG tablet Take 1 tablet (2 mg total) by mouth every 6 (six) hours as needed for moderate pain or severe pain. 20 tablet 0  . insulin lispro (HUMALOG) 100 UNIT/ML injection Inject 1-10  Units into the skin See admin instructions. Inject 1-10 units into the skin three times a day before meals, per sliding scale    . insulin NPH Human (HUMULIN N,NOVOLIN N) 100 UNIT/ML injection Inject 0.1 mLs (10 Units total) into the skin at bedtime. 10 mL 11  . levonorgestrel (MIRENA) 20 MCG/24HR IUD 1 each by Intrauterine route once. Implanted March 2018    . lipase/protease/amylase (CREON) 36000 UNITS CPEP capsule Take 72,000-108,000 Units by mouth See admin instructions. Take 108,000 units by mouth three times a day with meals and 72,000 units with each snack    . loperamide (IMODIUM) 2 MG capsule Take 2 mg by mouth See admin instructions. Take 2 mg by mouth with every dose of Creon    . multivitamin (RENA-VIT) TABS tablet Take 1 tablet by mouth at bedtime.    . pantoprazole (PROTONIX) 40 MG tablet Take 1 tablet (40 mg total) by mouth 2 (two) times daily for 30 days. 60 tablet 1  . potassium chloride SA (K-DUR,KLOR-CON) 20 MEQ tablet Take 1 tablet (20 mEq total) by mouth daily. (Patient taking differently: Take 20 mEq by mouth at bedtime. ) 90 tablet 0  . Travoprost, BAK Free, (TRAVATAN) 0.004 % SOLN ophthalmic solution Place 1 drop into both eyes at bedtime.     No current facility-administered medications for this visit.     REVIEW OF SYSTEMS:  [X]   denotes positive finding, [ ]  denotes negative finding Vascular    Leg swelling    Cardiac    Chest pain or chest pressure:    Shortness of breath upon exertion:    Short of breath when lying flat:    Irregular heart rhythm:    Constitutional    Fever or chills:     PHYSICAL EXAM:   Vitals:   08/05/18 0905  BP: (!) 141/56  Pulse: 77  Resp: 18  Temp: 97.9 F (36.6 C)  SpO2: 96%  Weight: 129 lb (58.5 kg)  Height: 5' (1.524 m)    GENERAL: The patient is a well-nourished female, in no acute distress. The vital signs are documented above. CARDIOVASCULAR: There is a regular rate and rhythm. PULMONARY: There is good air exchange  bilaterally without wheezing or rales. Her VAC was removed.  The wound measures 2 cm in width by 7 cm in length by 1 cm in depth.  There is good granulation tissue.  DATA:   No new data  MEDICAL ISSUES:   STATUS POST REPAIR OF A RIGHT GROIN AV FISTULA: Her wound is improving with the VAC.  Measurements are documented above.  We will continue with the [facility] as long as the wound is getting smaller.  I written for a follow-up visit in 3 weeks and I will see her at that time.  She knows to call sooner if she has problems.  Of note she is continued to have pain with the incision so I have written her prescription for Dilaudid.  I was unable to send this electronically through electronic medical records nor print a prescription from epic.  I spoke to the pharmacist and her only option was to do a handwritten prescription which I have done.  I written her for 2 mg of Dilaudid 1 every 6 hours as needed with #30.   [name] [facility] [phone]

## 2018-08-20 ENCOUNTER — Other Ambulatory Visit: Payer: Self-pay | Admitting: Internal Medicine

## 2018-08-20 ENCOUNTER — Other Ambulatory Visit: Payer: Self-pay | Admitting: *Deleted

## 2018-08-20 NOTE — Telephone Encounter (Signed)
Refill Request-Dilantin    Lynn, [place]

## 2018-08-20 NOTE — Telephone Encounter (Signed)
Called pt, it is dilaudid she wants not dilantin, sent request in new encounter

## 2018-08-25 ENCOUNTER — Telehealth (HOSPITAL_COMMUNITY): Payer: Self-pay | Admitting: Rehabilitation

## 2018-08-25 NOTE — Telephone Encounter (Signed)
The above patient or their representative was contacted and gave the following answers to these questions:         Do you have any of the following symptoms? No  Fever                    Cough                   Shortness of breath  Do  you have any of the following other symptoms? No   muscle pain         vomiting,        diarrhea        rash         weakness        red eye        abdominal pain         bruising          bruising or bleeding              joint pain           severe headache    Have you been in contact with someone who was or has been sick in the past 2 weeks? No Yes                 Unsure                         Unable to assess   Does the person that you were in contact with have any of the following symptoms?   Cough         shortness of breath           muscle pain         vomiting,            diarrhea            rash            weakness           fever            red eye           abdominal pain           bruising  or  bleeding                joint pain                severe headache               Have you  or someone you have been in contact with traveled internationally in th last month? No        If yes, which countries?   Have you  or someone you have been in contact with traveled outside [place] in th last month?         If yes, which state and city?   COMMENTS OR ACTION PLAN FOR THIS PATIENT:

## 2018-08-26 ENCOUNTER — Ambulatory Visit (INDEPENDENT_AMBULATORY_CARE_PROVIDER_SITE_OTHER): Payer: Medicare Other | Admitting: Vascular Surgery

## 2018-08-26 ENCOUNTER — Encounter: Payer: Self-pay | Admitting: Vascular Surgery

## 2018-08-26 ENCOUNTER — Other Ambulatory Visit: Payer: Self-pay

## 2018-08-26 VITALS — BP 95/63 | HR 70 | Temp 97.9°F | Resp 20 | Ht 60.0 in | Wt 117.0 lb

## 2018-08-26 DIAGNOSIS — Z48812 Encounter for surgical aftercare following surgery on the circulatory system: Secondary | ICD-10-CM

## 2018-08-26 DIAGNOSIS — I770 Arteriovenous fistula, acquired: Secondary | ICD-10-CM

## 2018-08-26 NOTE — Progress Notes (Signed)
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  HPI:   Priscilla [name] is a pleasant 48 y.o. female who I last saw on 08/05/2018.  The patient had undergone a previous heart cath and was subsequently found to have an AV fistula in the right groin.  She was asymptomatic but subsequently developed a wound on her right foot and I felt that addressing the fistula was necessary to facilitate wound healing.  She underwent repair of the fistula on 07/07/2018.  She initially did well but subsequently developed some fat necrosis and had I&D of her wound on 07/22/2018.  When I saw her last, the wound measured 2 cm in width by 7 cm in length by 1 cm in depth.  Therefore I felt we should continue the VAC as long as the wound was getting smaller.  Of note I did write her a prescription for 2 mg of Dilaudid to be taken every 6 hours as needed for pain.  I gave her 30.  We had to do a handwritten prescription as the lot it could not be done on the EMR.  They have been changing her VAC 3 times a week.  She denies any specific complaints.  She states that the wound on her right great toe is gradually improving.  She denies fever or chills.  Current Outpatient Medications  Medication Sig Dispense Refill  . acetaminophen (TYLENOL) 325 MG tablet Take 650 mg by mouth every 4 (four) hours as needed for mild pain.    Marland Kitchen amitriptyline (ELAVIL) 10 MG tablet Take 10 mg by mouth at bedtime.   3  . aspirin 81 MG chewable tablet Chew 1 tablet (81 mg total) by mouth daily. 30 tablet 0  . calcitRIOL (ROCALTROL) 0.5 MCG capsule Take 0.5 mcg by mouth at bedtime.     . calcium acetate (PHOSLO) 667 MG capsule Take 1,334-2,001 mg by mouth See admin instructions. Take 2,001 mg by mouth three times a day with meals and 1,334 mg with each snack    . gabapentin (NEURONTIN) 100 MG capsule Take 100 mg by mouth 3 (three) times  daily.    Marland Kitchen gentamicin cream (GARAMYCIN) 0.1 % Apply 1 application topically See admin instructions. Apply topically to dialysis site daily after showering    . HYDROmorphone (DILAUDID) 2 MG tablet Take 1 tablet (2 mg total) by mouth every 6 (six) hours as needed for moderate pain or severe pain. 20 tablet 0  . insulin lispro (HUMALOG) 100 UNIT/ML injection Inject 1-10 Units into the skin See admin instructions. Inject 1-10 units into the skin three times a day before meals, per sliding scale    . insulin NPH Human (HUMULIN N,NOVOLIN N) 100 UNIT/ML injection Inject 0.1 mLs (10 Units total) into the skin at bedtime. 10 mL 11  . levonorgestrel (MIRENA) 20 MCG/24HR IUD 1 each by Intrauterine route once. Implanted March 2018    . lipase/protease/amylase (CREON) 36000 UNITS CPEP capsule Take 72,000-108,000 Units by mouth See admin instructions. Take 108,000 units by mouth three times a day with meals and 72,000 units with each snack    . loperamide (IMODIUM) 2 MG capsule Take 2 mg by mouth See admin instructions. Take 2 mg by mouth with every dose of Creon    . multivitamin (RENA-VIT) TABS tablet Take 1 tablet by mouth at bedtime.    . pantoprazole (PROTONIX) 40 MG tablet Take 1  tablet (40 mg total) by mouth 2 (two) times daily for 30 days. 60 tablet 1  . potassium chloride SA (K-DUR,KLOR-CON) 20 MEQ tablet Take 1 tablet (20 mEq total) by mouth daily. (Patient taking differently: Take 20 mEq by mouth at bedtime. ) 90 tablet 0  . Travoprost, BAK Free, (TRAVATAN) 0.004 % SOLN ophthalmic solution Place 1 drop into both eyes at bedtime.     No current facility-administered medications for this visit.     REVIEW OF SYSTEMS:  [X]  denotes positive finding, [ ]  denotes negative finding Vascular    Leg swelling    Cardiac    Chest pain or chest pressure:    Shortness of breath upon exertion:    Short of breath when lying flat:    Irregular heart rhythm:    Constitutional    Fever or chills:      PHYSICAL EXAM:   Vitals:   08/26/18 0916  BP: 95/63  Pulse: 70  Resp: 20  Temp: 97.9 F (36.6 C)  SpO2: 96%  Weight: 117 lb (53.1 kg)  Height: 5' (1.524 m)    GENERAL: The patient is a well-nourished female, in no acute distress. The vital signs are documented above. CARDIOVASCULAR: There is a regular rate and rhythm. PULMONARY: There is good air exchange bilaterally without wheezing or rales. Her right groin incision is granulating nicely.  It measures 7 cm in length and 2 cm in width but it is no longer 1 cm in depth.  There is a wound on the distal aspect of the right great toe as documented below.  There is also a small superficial wound on the adjacent second toe on the dorsum.     DATA:   No new data  MEDICAL ISSUES:   STATUS POST REPAIR OF RIGHT FEMORAL AV FISTULA: The groin wound is healing.  At this point I think we can discontinue the VAC.  I recommended dressing changes with hydrogel daily.  I will asked that this be done by the home health nurse.  I will plan on seeing her back in 3 to 4 weeks.  She knows to call sooner if she has problems.  [name] [facility] [phone]

## 2018-09-02 ENCOUNTER — Ambulatory Visit: Payer: Self-pay | Admitting: Vascular Surgery

## 2018-09-07 ENCOUNTER — Other Ambulatory Visit: Payer: Self-pay | Admitting: Internal Medicine

## 2018-09-16 ENCOUNTER — Ambulatory Visit: Payer: 59 | Admitting: Vascular Surgery

## 2018-09-17 ENCOUNTER — Encounter: Payer: Self-pay | Admitting: Family

## 2018-09-22 ENCOUNTER — Ambulatory Visit (INDEPENDENT_AMBULATORY_CARE_PROVIDER_SITE_OTHER): Payer: Medicare Other | Admitting: Internal Medicine

## 2018-09-22 ENCOUNTER — Other Ambulatory Visit: Payer: Self-pay

## 2018-09-22 VITALS — BP 144/83 | HR 99 | Temp 97.6°F | Wt 114.8 lb

## 2018-09-22 DIAGNOSIS — Z79899 Other long term (current) drug therapy: Secondary | ICD-10-CM | POA: Diagnosis not present

## 2018-09-22 DIAGNOSIS — R159 Full incontinence of feces: Secondary | ICD-10-CM

## 2018-09-22 DIAGNOSIS — K529 Noninfective gastroenteritis and colitis, unspecified: Secondary | ICD-10-CM | POA: Diagnosis not present

## 2018-09-22 DIAGNOSIS — I509 Heart failure, unspecified: Secondary | ICD-10-CM | POA: Diagnosis not present

## 2018-09-22 DIAGNOSIS — K2970 Gastritis, unspecified, without bleeding: Secondary | ICD-10-CM | POA: Diagnosis not present

## 2018-09-22 DIAGNOSIS — Z7982 Long term (current) use of aspirin: Secondary | ICD-10-CM

## 2018-09-22 DIAGNOSIS — K859 Acute pancreatitis without necrosis or infection, unspecified: Secondary | ICD-10-CM

## 2018-09-22 DIAGNOSIS — L98491 Non-pressure chronic ulcer of skin of other sites limited to breakdown of skin: Secondary | ICD-10-CM | POA: Diagnosis not present

## 2018-09-22 DIAGNOSIS — L989 Disorder of the skin and subcutaneous tissue, unspecified: Secondary | ICD-10-CM | POA: Insufficient documentation

## 2018-09-22 DIAGNOSIS — I5042 Chronic combined systolic (congestive) and diastolic (congestive) heart failure: Secondary | ICD-10-CM

## 2018-09-22 MED ORDER — HYDROMORPHONE HCL 2 MG PO TABS: 2.0000 mg | ORAL_TABLET | Freq: Four times a day (QID) | ORAL | 0 refills | Status: AC | PRN

## 2018-09-22 NOTE — Patient Instructions (Addendum)
Thank you for visiting clinic today. I am not sure about your skin condition at this time, we will try making an urgent appointment with dermatologist. Continue using Desitin in your buttock cleft. Use Aqua Phor liberally around your buttock and thighs area to prevent further dryness. I am also giving you a pain medication called hydromorphone or Dilaudid and see if that will help with your pain. We are also ordering some home health services for you.

## 2018-09-22 NOTE — Assessment & Plan Note (Signed)
Patient is having multiple skin lesions of unknown etiology. Does not appear infected.  On right buttock appears healed blisters. High risk for infection due to fecal incontinence. Extremely dry skin with flaking.  -Advised patient to keep herself well-hydrated. -Continue using Desitin. -Use Aquaphor -apply generously over her body especially on her buttock area and thighs. -Referred her to see a dermatologist as soon as possible. -Gave her some Dilaudid tablets to be used as needed for pain. -Applied silicone gel adhesive-hydro-cellular foam dressing on her left buttock and behind right knee. -Ordered home health with wound care.

## 2018-09-22 NOTE — Assessment & Plan Note (Signed)
No acute exacerbation.  Patient with complicated past medical history with a recent prolonged hospitalization.  She is unable to take care of herself.  -Ordered home health with home nursing, physical therapy and wound care.

## 2018-09-22 NOTE — Progress Notes (Signed)
   CC: Skin breakdown around her buttock area.  HPI:  Priscilla [name] is a 48 y.o. with past medical history as listed below and a recent complicated hospital admission due to infection and GI bleed came to the clinic with complaint of multiple skin breakdowns in her buttock area, associated with pain and some pruritus. Patient has chronic diarrhea with fecal incontinence and uses adult diapers.  She called GI and they advised her to use Desitin.  Desitin to help with some itching but she continued to have worsening pain and multiple areas of skin breakdown around her rectum.  She is trying to keep that area clean and dry.  She was also experiencing extreme dryness and some blistering on her right buttock.  She also had some skin breakdown behind right knee.  She does has a pressure ulcer on left buttock area which seems healing. Patient is having chronic nausea due to her gastritis, had 2 nonbilious, nonbloody vomitus today.  Her appetite is decreased since her recent hospitalization.  She continued to experience 4-6 loose bowel movements daily despite using Creon and Imodium. She denies any fever or chills.  Denies any urinary symptoms.  See assessment and plan for her chronic conditions.  Past Medical History:  Diagnosis Date  . Anemia   . CAD (coronary artery disease)   . Cardiomyopathy, ischemic 06/24/2014  . Diabetes mellitus without complication ([place])   . DKA (diabetic ketoacidoses) ([place]) 05/14/2016  . End stage renal disease ([place])   . Foot drop, left foot   . GERD (gastroesophageal reflux disease)   . H/O kidney transplant   . Hyperlipidemia   . Hypertension   . Myocardial infarction ([place])   . Pancreas transplanted ([place])   . Renal disorder   . 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 S/P mitral valve repair 07/18/14   Review of Systems: Negative except mentioned in HPI.  Physical Exam:  Vitals:   09/22/18 1335  BP: (!) 144/83  Pulse: 99  Temp: 97.6 F (36.4 C)  TempSrc: Oral   SpO2: 99%  Weight: 114 lb 12.8 oz (52.1 kg)    General: Vital signs reviewed.  Patient is well-developed and well-nourished, in no acute distress and cooperative with exam.  Head: Normocephalic and atraumatic. Eyes: EOMI, conjunctivae normal, no scleral icterus.  Cardiovascular: RRR, S1 normal, S2 normal, no murmurs, gallops, or rubs. Pulmonary/Chest: Clear to auscultation bilaterally, no wheezes, rales, or rhonchi. Abdominal: Soft, non-tender, non-distended, BS +,  Extremities: No lower extremity edema bilaterally,  pulses symmetric and intact bilaterally. No cyanosis or clubbing. Skin: Multiple skin lesions around her anus, gluteal cleft, both buttocks.  No obvious discharge or blister formation. Skin appears extremely dry with flaking. Psychiatric: Normal mood and affect. speech and behavior is normal.   Assessment & Plan:   See Encounters Tab for problem based charting.  Patient seen with Dr. Lynnae January.

## 2018-09-23 ENCOUNTER — Telehealth: Payer: Self-pay

## 2018-09-23 NOTE — Telephone Encounter (Signed)
Needs to speak with a nurse about a referral to dermatology. Please call pt back.

## 2018-09-23 NOTE — Progress Notes (Signed)
Internal Medicine Clinic Attending  I saw and evaluated the patient.  I personally confirmed the key portions of the history and exam documented by Dr. Amin and I reviewed pertinent patient test results.  The assessment, diagnosis, and plan were formulated together and I agree with the documentation in the resident's note. 

## 2018-09-24 NOTE — Telephone Encounter (Signed)
I LVM for patient about Dermatology appointment for June 2,2020 @10 :30 with Dr. Annia Belt also gave the office her Email address Silverio Decamp C5/28/202010:47 AM

## 2018-09-24 NOTE — Telephone Encounter (Signed)
[facility] is working with patient,she is getting PT,Nursing for Wound Care.The patient is requesting an Aide.The patient has Medicare which will only pay for 2 weeks and then the patient would have to pay out of pocket. Patient has Medicaid Family Planning so will not qualify for PCS.I will do a follow up phone call to patient Priscilla [name] C5/28/20202:10 PM

## 2018-09-25 ENCOUNTER — Other Ambulatory Visit: Payer: Self-pay | Admitting: Internal Medicine

## 2018-09-25 NOTE — Telephone Encounter (Signed)
Follow up phone call to patient,about what her insurance would cover ,as for as Personal care Aid. I also gave information about her Dermatology appointment [place], [place] C5/29/202011:15 AM

## 2018-10-09 ENCOUNTER — Emergency Department (HOSPITAL_COMMUNITY): Payer: Medicare Other

## 2018-10-09 ENCOUNTER — Inpatient Hospital Stay (HOSPITAL_COMMUNITY)
Admission: EM | Admit: 2018-10-09 | Discharge: 2018-10-28 | DRG: 871 | Disposition: E | Payer: Medicare Other | Attending: Pulmonary Disease | Admitting: Pulmonary Disease

## 2018-10-09 ENCOUNTER — Encounter (HOSPITAL_COMMUNITY): Payer: Self-pay | Admitting: Emergency Medicine

## 2018-10-09 ENCOUNTER — Other Ambulatory Visit: Payer: Self-pay

## 2018-10-09 DIAGNOSIS — L89329 Pressure ulcer of left buttock, unspecified stage: Secondary | ICD-10-CM

## 2018-10-09 DIAGNOSIS — A0472 Enterocolitis due to Clostridium difficile, not specified as recurrent: Secondary | ICD-10-CM | POA: Diagnosis present

## 2018-10-09 DIAGNOSIS — T8611 Kidney transplant rejection: Secondary | ICD-10-CM | POA: Diagnosis present

## 2018-10-09 DIAGNOSIS — J90 Pleural effusion, not elsewhere classified: Secondary | ICD-10-CM | POA: Diagnosis present

## 2018-10-09 DIAGNOSIS — I2510 Atherosclerotic heart disease of native coronary artery without angina pectoris: Secondary | ICD-10-CM | POA: Diagnosis present

## 2018-10-09 DIAGNOSIS — R630 Anorexia: Secondary | ICD-10-CM | POA: Diagnosis not present

## 2018-10-09 DIAGNOSIS — Z20828 Contact with and (suspected) exposure to other viral communicable diseases: Secondary | ICD-10-CM | POA: Diagnosis present

## 2018-10-09 DIAGNOSIS — I4901 Ventricular fibrillation: Secondary | ICD-10-CM | POA: Diagnosis not present

## 2018-10-09 DIAGNOSIS — I959 Hypotension, unspecified: Secondary | ICD-10-CM | POA: Diagnosis present

## 2018-10-09 DIAGNOSIS — R531 Weakness: Secondary | ICD-10-CM | POA: Diagnosis not present

## 2018-10-09 DIAGNOSIS — L89319 Pressure ulcer of right buttock, unspecified stage: Secondary | ICD-10-CM

## 2018-10-09 DIAGNOSIS — K580 Irritable bowel syndrome with diarrhea: Secondary | ICD-10-CM | POA: Diagnosis present

## 2018-10-09 DIAGNOSIS — G9340 Encephalopathy, unspecified: Secondary | ICD-10-CM | POA: Diagnosis not present

## 2018-10-09 DIAGNOSIS — R7989 Other specified abnormal findings of blood chemistry: Secondary | ICD-10-CM

## 2018-10-09 DIAGNOSIS — E785 Hyperlipidemia, unspecified: Secondary | ICD-10-CM

## 2018-10-09 DIAGNOSIS — K8689 Other specified diseases of pancreas: Secondary | ICD-10-CM

## 2018-10-09 DIAGNOSIS — R570 Cardiogenic shock: Secondary | ICD-10-CM | POA: Diagnosis present

## 2018-10-09 DIAGNOSIS — R6521 Severe sepsis with septic shock: Secondary | ICD-10-CM | POA: Diagnosis present

## 2018-10-09 DIAGNOSIS — N186 End stage renal disease: Secondary | ICD-10-CM | POA: Diagnosis present

## 2018-10-09 DIAGNOSIS — I469 Cardiac arrest, cause unspecified: Secondary | ICD-10-CM | POA: Diagnosis not present

## 2018-10-09 DIAGNOSIS — Z79891 Long term (current) use of opiate analgesic: Secondary | ICD-10-CM

## 2018-10-09 DIAGNOSIS — T8612 Kidney transplant failure: Secondary | ICD-10-CM

## 2018-10-09 DIAGNOSIS — I255 Ischemic cardiomyopathy: Secondary | ICD-10-CM | POA: Diagnosis present

## 2018-10-09 DIAGNOSIS — Z825 Family history of asthma and other chronic lower respiratory diseases: Secondary | ICD-10-CM

## 2018-10-09 DIAGNOSIS — Z9483 Pancreas transplant status: Secondary | ICD-10-CM

## 2018-10-09 DIAGNOSIS — E1022 Type 1 diabetes mellitus with diabetic chronic kidney disease: Secondary | ICD-10-CM | POA: Diagnosis present

## 2018-10-09 DIAGNOSIS — N189 Chronic kidney disease, unspecified: Secondary | ICD-10-CM

## 2018-10-09 DIAGNOSIS — I452 Bifascicular block: Secondary | ICD-10-CM | POA: Diagnosis present

## 2018-10-09 DIAGNOSIS — Z8719 Personal history of other diseases of the digestive system: Secondary | ICD-10-CM

## 2018-10-09 DIAGNOSIS — E876 Hypokalemia: Secondary | ICD-10-CM | POA: Diagnosis present

## 2018-10-09 DIAGNOSIS — I248 Other forms of acute ischemic heart disease: Secondary | ICD-10-CM | POA: Diagnosis present

## 2018-10-09 DIAGNOSIS — J189 Pneumonia, unspecified organism: Secondary | ICD-10-CM | POA: Diagnosis present

## 2018-10-09 DIAGNOSIS — I120 Hypertensive chronic kidney disease with stage 5 chronic kidney disease or end stage renal disease: Secondary | ICD-10-CM | POA: Diagnosis present

## 2018-10-09 DIAGNOSIS — D631 Anemia in chronic kidney disease: Secondary | ICD-10-CM | POA: Diagnosis present

## 2018-10-09 DIAGNOSIS — Z8774 Personal history of (corrected) congenital malformations of heart and circulatory system: Secondary | ICD-10-CM

## 2018-10-09 DIAGNOSIS — Z8249 Family history of ischemic heart disease and other diseases of the circulatory system: Secondary | ICD-10-CM

## 2018-10-09 DIAGNOSIS — Z940 Kidney transplant status: Secondary | ICD-10-CM

## 2018-10-09 DIAGNOSIS — A419 Sepsis, unspecified organism: Principal | ICD-10-CM | POA: Diagnosis present

## 2018-10-09 DIAGNOSIS — L97519 Non-pressure chronic ulcer of other part of right foot with unspecified severity: Secondary | ICD-10-CM | POA: Diagnosis present

## 2018-10-09 DIAGNOSIS — Z823 Family history of stroke: Secondary | ICD-10-CM

## 2018-10-09 DIAGNOSIS — Y830 Surgical operation with transplant of whole organ as the cause of abnormal reaction of the patient, or of later complication, without mention of misadventure at the time of the procedure: Secondary | ICD-10-CM | POA: Diagnosis present

## 2018-10-09 DIAGNOSIS — E871 Hypo-osmolality and hyponatremia: Secondary | ICD-10-CM | POA: Diagnosis present

## 2018-10-09 DIAGNOSIS — Z79899 Other long term (current) drug therapy: Secondary | ICD-10-CM

## 2018-10-09 DIAGNOSIS — R0600 Dyspnea, unspecified: Secondary | ICD-10-CM

## 2018-10-09 DIAGNOSIS — E10621 Type 1 diabetes mellitus with foot ulcer: Secondary | ICD-10-CM | POA: Diagnosis present

## 2018-10-09 DIAGNOSIS — Z952 Presence of prosthetic heart valve: Secondary | ICD-10-CM

## 2018-10-09 DIAGNOSIS — Z951 Presence of aortocoronary bypass graft: Secondary | ICD-10-CM

## 2018-10-09 DIAGNOSIS — Z7982 Long term (current) use of aspirin: Secondary | ICD-10-CM

## 2018-10-09 DIAGNOSIS — G709 Myoneural disorder, unspecified: Secondary | ICD-10-CM | POA: Diagnosis present

## 2018-10-09 DIAGNOSIS — E872 Acidosis: Secondary | ICD-10-CM | POA: Diagnosis present

## 2018-10-09 DIAGNOSIS — R0902 Hypoxemia: Secondary | ICD-10-CM | POA: Diagnosis not present

## 2018-10-09 DIAGNOSIS — Z841 Family history of disorders of kidney and ureter: Secondary | ICD-10-CM

## 2018-10-09 DIAGNOSIS — K219 Gastro-esophageal reflux disease without esophagitis: Secondary | ICD-10-CM | POA: Diagnosis present

## 2018-10-09 DIAGNOSIS — I9589 Other hypotension: Secondary | ICD-10-CM | POA: Diagnosis not present

## 2018-10-09 DIAGNOSIS — R197 Diarrhea, unspecified: Secondary | ICD-10-CM | POA: Diagnosis not present

## 2018-10-09 DIAGNOSIS — Z833 Family history of diabetes mellitus: Secondary | ICD-10-CM

## 2018-10-09 DIAGNOSIS — L89219 Pressure ulcer of right hip, unspecified stage: Secondary | ICD-10-CM

## 2018-10-09 DIAGNOSIS — Z794 Long term (current) use of insulin: Secondary | ICD-10-CM

## 2018-10-09 DIAGNOSIS — I252 Old myocardial infarction: Secondary | ICD-10-CM

## 2018-10-09 DIAGNOSIS — Z992 Dependence on renal dialysis: Secondary | ICD-10-CM

## 2018-10-09 DIAGNOSIS — L89229 Pressure ulcer of left hip, unspecified stage: Secondary | ICD-10-CM

## 2018-10-09 LAB — COMPREHENSIVE METABOLIC PANEL
ALT: 25 U/L (ref 0–44)
AST: 28 U/L (ref 15–41)
Albumin: 1.3 g/dL — ABNORMAL LOW (ref 3.5–5.0)
Alkaline Phosphatase: 284 U/L — ABNORMAL HIGH (ref 38–126)
Anion gap: 21 — ABNORMAL HIGH (ref 5–15)
BUN: 29 mg/dL — ABNORMAL HIGH (ref 6–20)
CO2: 20 mmol/L — ABNORMAL LOW (ref 22–32)
Calcium: 7.6 mg/dL — ABNORMAL LOW (ref 8.9–10.3)
Chloride: 87 mmol/L — ABNORMAL LOW (ref 98–111)
Creatinine, Ser: 8.7 mg/dL — ABNORMAL HIGH (ref 0.44–1.00)
GFR calc Af Amer: 6 mL/min — ABNORMAL LOW (ref >60)
GFR calc non Af Amer: 5 mL/min — ABNORMAL LOW (ref >60)
Glucose, Bld: 256 mg/dL — ABNORMAL HIGH (ref 70–99)
Potassium: <2 mmol/L — CL (ref 3.5–5.1)
Sodium: 128 mmol/L — ABNORMAL LOW (ref 135–145)
Total Bilirubin: 1.2 mg/dL (ref 0.3–1.2)
Total Protein: 6.1 g/dL — ABNORMAL LOW (ref 6.5–8.1)

## 2018-10-09 LAB — BLOOD GAS, VENOUS
Acid-base deficit: 1.3 mmol/L (ref 0.0–2.0)
Bicarbonate: 24.1 mmol/L (ref 20.0–28.0)
O2 Content: 3 L/min
O2 Saturation: 34.6 %
Patient temperature: 97.7
pCO2, Ven: 48.5 mmHg (ref 44.0–60.0)
pH, Ven: 7.314 (ref 7.250–7.430)

## 2018-10-09 LAB — CBC WITH DIFFERENTIAL/PLATELET
Abs Immature Granulocytes: 0.1 10*3/uL — ABNORMAL HIGH (ref 0.00–0.07)
Basophils Absolute: 0 10*3/uL (ref 0.0–0.1)
Basophils Relative: 0 %
Eosinophils Absolute: 0 10*3/uL (ref 0.0–0.5)
Eosinophils Relative: 0 %
HCT: 28.4 % — ABNORMAL LOW (ref 36.0–46.0)
Hemoglobin: 9.5 g/dL — ABNORMAL LOW (ref 12.0–15.0)
Immature Granulocytes: 1 %
Lymphocytes Relative: 3 %
Lymphs Abs: 0.4 10*3/uL — ABNORMAL LOW (ref 0.7–4.0)
MCH: 24.6 pg — ABNORMAL LOW (ref 26.0–34.0)
MCHC: 33.5 g/dL (ref 30.0–36.0)
MCV: 73.6 fL — ABNORMAL LOW (ref 80.0–100.0)
Monocytes Absolute: 0.4 10*3/uL (ref 0.1–1.0)
Monocytes Relative: 3 %
Neutro Abs: 12.7 10*3/uL — ABNORMAL HIGH (ref 1.7–7.7)
Neutrophils Relative %: 93 %
Platelets: 345 10*3/uL (ref 150–400)
RBC: 3.86 MIL/uL — ABNORMAL LOW (ref 3.87–5.11)
RDW: 19.1 % — ABNORMAL HIGH (ref 11.5–15.5)
WBC: 13.7 10*3/uL — ABNORMAL HIGH (ref 4.0–10.5)
nRBC: 0 % (ref 0.0–0.2)

## 2018-10-09 LAB — BASIC METABOLIC PANEL
Anion gap: 20 — ABNORMAL HIGH (ref 5–15)
Anion gap: 17 — ABNORMAL HIGH (ref 5–15)
Anion gap: 16 — ABNORMAL HIGH (ref 5–15)
Anion gap: 15 (ref 5–15)
Anion gap: 13 (ref 5–15)
Anion gap: 12 (ref 5–15)
BUN: 29 mg/dL — ABNORMAL HIGH (ref 6–20)
BUN: 29 mg/dL — ABNORMAL HIGH (ref 6–20)
BUN: 30 mg/dL — ABNORMAL HIGH (ref 6–20)
BUN: 30 mg/dL — ABNORMAL HIGH (ref 6–20)
BUN: 29 mg/dL — ABNORMAL HIGH (ref 6–20)
BUN: 30 mg/dL — ABNORMAL HIGH (ref 6–20)
CO2: 19 mmol/L — ABNORMAL LOW (ref 22–32)
CO2: 19 mmol/L — ABNORMAL LOW (ref 22–32)
CO2: 20 mmol/L — ABNORMAL LOW (ref 22–32)
CO2: 18 mmol/L — ABNORMAL LOW (ref 22–32)
CO2: 20 mmol/L — ABNORMAL LOW (ref 22–32)
CO2: 21 mmol/L — ABNORMAL LOW (ref 22–32)
Calcium: 7.1 mg/dL — ABNORMAL LOW (ref 8.9–10.3)
Calcium: 6.7 mg/dL — ABNORMAL LOW (ref 8.9–10.3)
Calcium: 6.8 mg/dL — ABNORMAL LOW (ref 8.9–10.3)
Calcium: 6.9 mg/dL — ABNORMAL LOW (ref 8.9–10.3)
Calcium: 6.9 mg/dL — ABNORMAL LOW (ref 8.9–10.3)
Calcium: 6.7 mg/dL — ABNORMAL LOW (ref 8.9–10.3)
Chloride: 89 mmol/L — ABNORMAL LOW (ref 98–111)
Chloride: 92 mmol/L — ABNORMAL LOW (ref 98–111)
Chloride: 96 mmol/L — ABNORMAL LOW (ref 98–111)
Chloride: 98 mmol/L (ref 98–111)
Chloride: 99 mmol/L (ref 98–111)
Chloride: 99 mmol/L (ref 98–111)
Creatinine, Ser: 8.71 mg/dL — ABNORMAL HIGH (ref 0.44–1.00)
Creatinine, Ser: 8.28 mg/dL — ABNORMAL HIGH (ref 0.44–1.00)
Creatinine, Ser: 8.45 mg/dL — ABNORMAL HIGH (ref 0.44–1.00)
Creatinine, Ser: 8.29 mg/dL — ABNORMAL HIGH (ref 0.44–1.00)
Creatinine, Ser: 7.91 mg/dL — ABNORMAL HIGH (ref 0.44–1.00)
Creatinine, Ser: 8.21 mg/dL — ABNORMAL HIGH (ref 0.44–1.00)
GFR calc Af Amer: 6 mL/min — ABNORMAL LOW (ref >60)
GFR calc Af Amer: 6 mL/min — ABNORMAL LOW (ref >60)
GFR calc Af Amer: 6 mL/min — ABNORMAL LOW (ref >60)
GFR calc Af Amer: 6 mL/min — ABNORMAL LOW (ref >60)
GFR calc Af Amer: 6 mL/min — ABNORMAL LOW (ref >60)
GFR calc Af Amer: 6 mL/min — ABNORMAL LOW (ref >60)
GFR calc non Af Amer: 5 mL/min — ABNORMAL LOW (ref >60)
GFR calc non Af Amer: 5 mL/min — ABNORMAL LOW (ref >60)
GFR calc non Af Amer: 5 mL/min — ABNORMAL LOW (ref >60)
GFR calc non Af Amer: 5 mL/min — ABNORMAL LOW (ref >60)
GFR calc non Af Amer: 6 mL/min — ABNORMAL LOW (ref >60)
GFR calc non Af Amer: 5 mL/min — ABNORMAL LOW (ref >60)
Glucose, Bld: 285 mg/dL — ABNORMAL HIGH (ref 70–99)
Glucose, Bld: 308 mg/dL — ABNORMAL HIGH (ref 70–99)
Glucose, Bld: 250 mg/dL — ABNORMAL HIGH (ref 70–99)
Glucose, Bld: 199 mg/dL — ABNORMAL HIGH (ref 70–99)
Glucose, Bld: 182 mg/dL — ABNORMAL HIGH (ref 70–99)
Glucose, Bld: 157 mg/dL — ABNORMAL HIGH (ref 70–99)
Potassium: <2 mmol/L — CL (ref 3.5–5.1)
Potassium: 2.5 mmol/L — CL (ref 3.5–5.1)
Potassium: 2.4 mmol/L — CL (ref 3.5–5.1)
Potassium: 2.5 mmol/L — CL (ref 3.5–5.1)
Potassium: 2.5 mmol/L — CL (ref 3.5–5.1)
Potassium: 2.6 mmol/L — CL (ref 3.5–5.1)
Sodium: 128 mmol/L — ABNORMAL LOW (ref 135–145)
Sodium: 128 mmol/L — ABNORMAL LOW (ref 135–145)
Sodium: 132 mmol/L — ABNORMAL LOW (ref 135–145)
Sodium: 131 mmol/L — ABNORMAL LOW (ref 135–145)
Sodium: 132 mmol/L — ABNORMAL LOW (ref 135–145)
Sodium: 132 mmol/L — ABNORMAL LOW (ref 135–145)

## 2018-10-09 LAB — LACTIC ACID, PLASMA
Lactic Acid, Venous: 5 mmol/L (ref 0.5–1.9)
Lactic Acid, Venous: 4.3 mmol/L (ref 0.5–1.9)
Lactic Acid, Venous: 3 mmol/L (ref 0.5–1.9)

## 2018-10-09 LAB — GLUCOSE, CAPILLARY
Glucose-Capillary: 253 mg/dL — ABNORMAL HIGH (ref 70–99)
Glucose-Capillary: 257 mg/dL — ABNORMAL HIGH (ref 70–99)
Glucose-Capillary: 34 mg/dL — CL (ref 70–99)
Glucose-Capillary: 81 mg/dL (ref 70–99)

## 2018-10-09 LAB — MAGNESIUM: Magnesium: 1.2 mg/dL — ABNORMAL LOW (ref 1.7–2.4)

## 2018-10-09 LAB — I-STAT BETA HCG BLOOD, ED (MC, WL, AP ONLY): I-stat hCG, quantitative: <5 m[IU]/mL (ref <5)

## 2018-10-09 LAB — PROTIME-INR
INR: 1.7 — ABNORMAL HIGH (ref 0.8–1.2)
Prothrombin Time: 19.6 seconds — ABNORMAL HIGH (ref 11.4–15.2)

## 2018-10-09 LAB — TROPONIN I
Troponin I: 0.2 ng/mL (ref <0.03)
Troponin I: 0.21 ng/mL (ref <0.03)
Troponin I: 0.19 ng/mL (ref <0.03)

## 2018-10-09 LAB — MRSA PCR SCREENING: MRSA by PCR: NEGATIVE

## 2018-10-09 LAB — SARS CORONAVIRUS 2: SARS Coronavirus 2: NOT DETECTED

## 2018-10-09 MED ORDER — SODIUM CHLORIDE 0.9 % IV BOLUS
500.0000 mL | Freq: Once | INTRAVENOUS | Status: AC
  Administered 2018-10-09: 500 mL via INTRAVENOUS

## 2018-10-09 MED ORDER — DEXTROSE 5 % IV SOLN
250.0000 mg | INTRAVENOUS | Status: DC
  Administered 2018-10-10: 250 mg via INTRAVENOUS
  Filled 2018-10-09 (×3): qty 250

## 2018-10-09 MED ORDER — SODIUM CHLORIDE 0.9 % IV SOLN
2.0000 g | INTRAVENOUS | Status: DC
  Filled 2018-10-09: qty 20

## 2018-10-09 MED ORDER — SODIUM CHLORIDE 0.9 % IV BOLUS
1000.0000 mL | Freq: Once | INTRAVENOUS | Status: AC
  Administered 2018-10-09: 1000 mL via INTRAVENOUS

## 2018-10-09 MED ORDER — CHLORHEXIDINE GLUCONATE CLOTH 2 % EX PADS
6.0000 | MEDICATED_PAD | Freq: Every day | CUTANEOUS | Status: DC
  Administered 2018-10-09 – 2018-10-10 (×2): 6 via TOPICAL

## 2018-10-09 MED ORDER — HYDROMORPHONE HCL 2 MG PO TABS
1.0000 mg | ORAL_TABLET | Freq: Once | ORAL | Status: AC
  Administered 2018-10-09: 1 mg via ORAL
  Filled 2018-10-09: qty 1

## 2018-10-09 MED ORDER — SODIUM CHLORIDE 0.9 % IV BOLUS (SEPSIS)
500.0000 mL | Freq: Once | INTRAVENOUS | Status: AC
  Administered 2018-10-09: 500 mL via INTRAVENOUS

## 2018-10-09 MED ORDER — POTASSIUM CHLORIDE IN NACL 20-0.9 MEQ/L-% IV SOLN
INTRAVENOUS | Status: AC
  Administered 2018-10-10 (×2): via INTRAVENOUS
  Filled 2018-10-09 (×2): qty 1000

## 2018-10-09 MED ORDER — RENA-VITE PO TABS
1.0000 | ORAL_TABLET | Freq: Every day | ORAL | Status: DC
  Administered 2018-10-10: 1 via ORAL
  Filled 2018-10-09 (×2): qty 1

## 2018-10-09 MED ORDER — SODIUM CHLORIDE 0.9 % IV SOLN
1.0000 g | Freq: Once | INTRAVENOUS | Status: AC
  Administered 2018-10-09: 1 g via INTRAVENOUS
  Filled 2018-10-09: qty 10

## 2018-10-09 MED ORDER — METRONIDAZOLE IN NACL 5-0.79 MG/ML-% IV SOLN
500.0000 mg | Freq: Three times a day (TID) | INTRAVENOUS | Status: DC
  Administered 2018-10-10 – 2018-10-11 (×4): 500 mg via INTRAVENOUS
  Filled 2018-10-09 (×5): qty 100

## 2018-10-09 MED ORDER — INSULIN ASPART 100 UNIT/ML ~~LOC~~ SOLN: 0.0000 [IU] | Freq: Every day | SUBCUTANEOUS | Status: DC

## 2018-10-09 MED ORDER — POTASSIUM CHLORIDE CRYS ER 20 MEQ PO TBCR
40.0000 meq | EXTENDED_RELEASE_TABLET | Freq: Once | ORAL | Status: AC
  Administered 2018-10-09: 40 meq via ORAL
  Filled 2018-10-09: qty 2

## 2018-10-09 MED ORDER — CALCIUM ACETATE (PHOS BINDER) 667 MG PO CAPS
2001.0000 mg | ORAL_CAPSULE | Freq: Three times a day (TID) | ORAL | Status: DC
  Filled 2018-10-09 (×5): qty 3

## 2018-10-09 MED ORDER — DEXTROSE 50 % IV SOLN
INTRAVENOUS | Status: AC
  Administered 2018-10-09
  Filled 2018-10-09: qty 50

## 2018-10-09 MED ORDER — POTASSIUM CHLORIDE 10 MEQ/100ML IV SOLN
10.0000 meq | INTRAVENOUS | Status: DC
  Administered 2018-10-09: 10 meq via INTRAVENOUS
  Filled 2018-10-09: qty 100

## 2018-10-09 MED ORDER — POTASSIUM CHLORIDE IN NACL 40-0.9 MEQ/L-% IV SOLN
INTRAVENOUS | Status: DC
  Administered 2018-10-09 – 2018-10-10 (×2): 200 mL/h via INTRAVENOUS
  Filled 2018-10-09 (×2): qty 1000

## 2018-10-09 MED ORDER — ACETAMINOPHEN 325 MG PO TABS
650.0000 mg | ORAL_TABLET | Freq: Four times a day (QID) | ORAL | Status: DC | PRN
  Administered 2018-10-10: 650 mg via ORAL
  Filled 2018-10-09: qty 2

## 2018-10-09 MED ORDER — POTASSIUM CHLORIDE CRYS ER 20 MEQ PO TBCR
20.0000 meq | EXTENDED_RELEASE_TABLET | Freq: Once | ORAL | Status: AC
  Administered 2018-10-09: 20 meq via ORAL
  Filled 2018-10-09: qty 1

## 2018-10-09 MED ORDER — PANCRELIPASE (LIP-PROT-AMYL) 36000-114000 UNITS PO CPEP
72000.0000 [IU] | ORAL_CAPSULE | ORAL | Status: DC
  Filled 2018-10-09 (×5): qty 2

## 2018-10-09 MED ORDER — PANTOPRAZOLE SODIUM 40 MG PO TBEC
40.0000 mg | DELAYED_RELEASE_TABLET | Freq: Every day | ORAL | Status: DC
  Administered 2018-10-09 – 2018-10-10 (×2): 40 mg via ORAL
  Filled 2018-10-09 (×2): qty 1

## 2018-10-09 MED ORDER — PANCRELIPASE (LIP-PROT-AMYL) 12000-38000 UNITS PO CPEP: 108000.0000 [IU] | ORAL_CAPSULE | Freq: Three times a day (TID) | ORAL | Status: DC

## 2018-10-09 MED ORDER — POTASSIUM CHLORIDE CRYS ER 20 MEQ PO TBCR: 20.0000 meq | EXTENDED_RELEASE_TABLET | Freq: Every day | ORAL | Status: DC

## 2018-10-09 MED ORDER — SODIUM CHLORIDE 0.9 % IV SOLN: INTRAVENOUS | Status: DC

## 2018-10-09 MED ORDER — SODIUM CHLORIDE 0.9 % IV SOLN
2.0000 g | INTRAVENOUS | Status: DC
  Administered 2018-10-10: 2 g via INTRAVENOUS
  Filled 2018-10-09 (×2): qty 20

## 2018-10-09 MED ORDER — INSULIN ASPART 100 UNIT/ML ~~LOC~~ SOLN
0.0000 [IU] | Freq: Three times a day (TID) | SUBCUTANEOUS | Status: DC
  Administered 2018-10-09 (×2): 5 [IU] via SUBCUTANEOUS

## 2018-10-09 MED ORDER — SODIUM CHLORIDE 0.9 % IV BOLUS (SEPSIS): 1000.0000 mL | Freq: Once | INTRAVENOUS | Status: DC

## 2018-10-09 MED ORDER — HEPARIN SODIUM (PORCINE) 5000 UNIT/ML IJ SOLN: 5000.0000 [IU] | Freq: Three times a day (TID) | INTRAMUSCULAR | Status: AC

## 2018-10-09 MED ORDER — CHLORHEXIDINE GLUCONATE CLOTH 2 % EX PADS
6.0000 | MEDICATED_PAD | Freq: Every day | CUTANEOUS | Status: DC
  Administered 2018-10-09: 6 via TOPICAL

## 2018-10-09 MED ORDER — INSULIN NPH (HUMAN) (ISOPHANE) 100 UNIT/ML ~~LOC~~ SUSP
5.0000 [IU] | Freq: Every day | SUBCUTANEOUS | Status: DC
  Filled 2018-10-09: qty 10

## 2018-10-09 MED ORDER — MAGNESIUM SULFATE 50 % IJ SOLN
3.0000 g | Freq: Once | INTRAVENOUS | Status: AC
  Administered 2018-10-09: 3 g via INTRAVENOUS
  Filled 2018-10-09: qty 6

## 2018-10-09 MED ORDER — ONDANSETRON HCL 4 MG PO TABS: 4.0000 mg | ORAL_TABLET | Freq: Four times a day (QID) | ORAL | Status: DC | PRN

## 2018-10-09 MED ORDER — ACETAMINOPHEN 650 MG RE SUPP: 650.0000 mg | Freq: Four times a day (QID) | RECTAL | Status: DC | PRN

## 2018-10-09 MED ORDER — GABAPENTIN 100 MG PO CAPS
100.0000 mg | ORAL_CAPSULE | Freq: Three times a day (TID) | ORAL | Status: DC
  Administered 2018-10-09 – 2018-10-10 (×5): 100 mg via ORAL
  Filled 2018-10-09 (×7): qty 1

## 2018-10-09 MED ORDER — CALCIUM ACETATE (PHOS BINDER) 667 MG PO CAPS
1334.0000 mg | ORAL_CAPSULE | ORAL | Status: DC
  Filled 2018-10-09 (×5): qty 2

## 2018-10-09 MED ORDER — SODIUM CHLORIDE 0.9 % IV SOLN
INTRAVENOUS | Status: DC
  Administered 2018-10-09 (×3): via INTRAVENOUS

## 2018-10-09 MED ORDER — ONDANSETRON HCL 4 MG/2ML IJ SOLN
4.0000 mg | Freq: Four times a day (QID) | INTRAMUSCULAR | Status: DC | PRN
  Administered 2018-10-11: 4 mg via INTRAVENOUS
  Filled 2018-10-09: qty 2

## 2018-10-09 MED ORDER — POTASSIUM CHLORIDE 20 MEQ PO PACK
40.0000 meq | PACK | Freq: Once | ORAL | Status: AC
  Administered 2018-10-09: 40 meq via ORAL
  Filled 2018-10-09: qty 2

## 2018-10-09 MED ORDER — PANCRELIPASE (LIP-PROT-AMYL) 36000-114000 UNITS PO CPEP: 72000.0000 [IU] | ORAL_CAPSULE | ORAL | Status: DC

## 2018-10-09 MED ORDER — SODIUM CHLORIDE 0.9 % IV BOLUS (SEPSIS)
250.0000 mL | Freq: Once | INTRAVENOUS | Status: AC
  Administered 2018-10-09: 250 mL via INTRAVENOUS

## 2018-10-09 MED ORDER — SODIUM CHLORIDE 0.9 % IV SOLN
500.0000 mg | Freq: Once | INTRAVENOUS | Status: AC
  Administered 2018-10-09: 500 mg via INTRAVENOUS
  Filled 2018-10-09: qty 500

## 2018-10-09 MED ORDER — CALCITRIOL 0.5 MCG PO CAPS
0.5000 ug | ORAL_CAPSULE | Freq: Every day | ORAL | Status: DC
  Administered 2018-10-10: 0.5 ug via ORAL
  Filled 2018-10-09 (×2): qty 1

## 2018-10-09 MED ORDER — PANCRELIPASE (LIP-PROT-AMYL) 36000-114000 UNITS PO CPEP
108000.0000 [IU] | ORAL_CAPSULE | Freq: Three times a day (TID) | ORAL | Status: DC
  Filled 2018-10-09 (×2): qty 3
  Filled 2018-10-09: qty 9
  Filled 2018-10-09 (×3): qty 3

## 2018-10-09 MED ORDER — ASPIRIN 81 MG PO CHEW
81.0000 mg | CHEWABLE_TABLET | Freq: Every day | ORAL | Status: DC
  Administered 2018-10-09 – 2018-10-10 (×2): 81 mg via ORAL
  Filled 2018-10-09 (×2): qty 1

## 2018-10-09 MED ORDER — CALCIUM ACETATE 667 MG PO CAPS: 1334.0000 mg | ORAL_CAPSULE | ORAL | Status: DC

## 2018-10-09 NOTE — ED Notes (Addendum)
Dr. Dareen Piano at pt bedside; notified MD pt's lactic level is 4.3.

## 2018-10-09 NOTE — Progress Notes (Signed)
Asked to place tunneled HD catheter by IMTS.    Went back to evaluate the patient.  The patient's BP increases with fluid but immediately after her frequent large bowel movements she drops her pressure to the 12-16 systolic.  Remains completely mentally intact, protecting her airway and with no respiratory difficulty.  The patient remains profoundly under-resuscitated from a fluid standpoint.  Her continuous diarrhea is clearly exceeding the amount of IVF been given IV.  She also remains underresuscitated from potassium standpoint.  I will move patient to the ICU for closer monitoring and more aggressive resuscitation.  Will increase IVF to 200 ml/hr of NS with 20 meq of KCl/liter and more frequent BMET checks.  Will place a flexiseal for closer I/O monitoring to appropriately volume resuscitated.  Suspect will need HD in AM and will need a discussion with renal prior to placement of HD catheter.  PCCM will take over care at this point.  The patient is critically ill with multiple organ systems failure and requires high complexity decision making for assessment and support, frequent evaluation and titration of therapies, application of advanced monitoring technologies and extensive interpretation of multiple databases.   Critical Care Time devoted to patient care services described in this note is  45  Minutes. This time reflects time of care of this signee Dr Jennet Maduro. This critical care time does not reflect procedure time, or teaching time or supervisory time of PA/NP/Med student/Med Resident etc but could involve care discussion time.  Rush Farmer, M.D. [facility] Pulmonary/Critical Care Medicine. Pager: [phone]. After hours pager: [phone].

## 2018-10-09 NOTE — ED Notes (Signed)
Called lab for 3rd time to check on delay of CMP results. Each time, they stated results will be up shortly

## 2018-10-09 NOTE — Progress Notes (Signed)
Limited IV access.  [place] utilized, #20g IV placed x 1 stick to RUE without difficulty.  Sterile technique utilized for placement.  Good blood return.  Site dressed with clear occlusive dressing.  Patient tolerated well.   Noe Gens, NP-C [place] [facility] Pgr: [phone] or if no answer [phone] 10/22/2018, 3:52 PM

## 2018-10-09 NOTE — H&P (Signed)
Date: 10/05/2018               Patient Name:  Priscilla [name] MRN: [id number]  DOB: [birth year] Age / Sex: 48 y.o., female   PCP: Priscilla Mage, MD         Medical Service: Internal Medicine Teaching Service         Attending Physician: Dr. Aldine Contes, MD    First Contact: Dr. Alfonse [name] Pager: [phone]  Second Contact: Dr. Maricela [name] Pager: [phone]       After Hours (After 5p/  First Contact Pager: [phone]  weekends / holidays): Second Contact Pager: [phone]   Chief Complaint: Shortness of breath/hypotension  History of Present Illness: Priscilla [name] is a 48 y.o female with CAD with ischemic cardiomyopathy, DM, ESRD on peritoneal dialysis, HTN, and HLD presenting to the ED with shortness of breath and hypotension. History obtained via patient and chart review.  She reports she hasn't been able to eat or keep anything down since being discharged from the hospital in April. Endorses loss of appetite with N/V. Has been taking antiemetics, promethazine and has been evaluated by GI who told her she had IBS and needed to eat frequent small meals. She denies abdominal pain and no changes in bowel habits. Since hospital discharge in April she developed ulcers on her side that the dermatologist told her were herpetic. Yesterday her BP was low so she didn't do peritoneal HD. Today she felt she needed to completed PD but after 3 hours developed [facility] and had to stop. Did have some associated chest pain and wheezing. She continues to feel [facility]. Denies any fevers, chills, or sick contacts.   Has been on peritoneal HD for "years." Usually tolerates her sessions very well. Recently she has had to start decreasing the amount of fluid she pulls due to low BP. She has not had to decrease her run time or has not missed more sessions. No change in taste, is sleeping more than usual but thinks this is due to pain medicine, no pruritis. Also reports generalized weakness.  Also does have new  ulcers on the right foot. That developed after her recent hospitalization.   Meds:  Current Meds  Medication Sig  . acetaminophen (TYLENOL) 325 MG tablet Take 650 mg by mouth every 4 (four) hours as needed for mild pain.  Marland Kitchen amitriptyline (ELAVIL) 10 MG tablet Take 10 mg by mouth at bedtime.   Marland Kitchen aspirin 81 MG chewable tablet CHEW AND SWALLOW 1 TABLET(81 MG) BY MOUTH DAILY (Patient taking differently: Chew 81 mg by mouth daily. )  . calcitRIOL (ROCALTROL) 0.5 MCG capsule Take 0.5 mcg by mouth at bedtime.   . calcium acetate (PHOSLO) 667 MG capsule Take 1,334-2,001 mg by mouth See admin instructions. Take 2,001 mg by mouth three times a day with meals and 1,334 mg with each snack  . diphenoxylate-atropine (LOMOTIL) 2.5-0.025 MG tablet Take 1 tablet by mouth 4 (four) times daily as needed for diarrhea or loose stools.  . gabapentin (NEURONTIN) 100 MG capsule Take 100 mg by mouth 3 (three) times daily.  Marland Kitchen gentamicin cream (GARAMYCIN) 0.1 % Apply 1 application topically See admin instructions. Apply topically to dialysis site daily after showering  . HYDROmorphone (DILAUDID) 2 MG tablet Take 1 tablet (2 mg total) by mouth every 6 (six) hours as needed for moderate pain or severe pain.  Marland Kitchen insulin lispro (HUMALOG) 100 UNIT/ML injection Inject 1-10 Units into the skin See admin instructions. Inject 1-10 units into  the skin three times a day before meals, per sliding scale  . insulin NPH Human (HUMULIN N,NOVOLIN N) 100 UNIT/ML injection Inject 0.1 mLs (10 Units total) into the skin at bedtime.  Marland Kitchen levonorgestrel (MIRENA) 20 MCG/24HR IUD 1 each by Intrauterine route once. Implanted March 2018  . lipase/protease/amylase (CREON) 36000 UNITS CPEP capsule Take 72,000-108,000 Units by mouth See admin instructions. Take 108,000 units by mouth three times a day with meals and 72,000 units with each snack  . multivitamin (RENA-VIT) TABS tablet Take 1 tablet by mouth at bedtime.  . mupirocin cream (BACTROBAN) 2 %  Apply 1 application topically daily.  . ondansetron (ZOFRAN) 4 MG tablet Take 4 mg by mouth every 8 (eight) hours as needed for nausea or vomiting.  . pantoprazole (PROTONIX) 40 MG tablet TAKE 1 TABLET BY MOUTH TWICE DAILY (Patient taking differently: Take 40 mg by mouth daily. )  . potassium chloride SA (K-DUR,KLOR-CON) 20 MEQ tablet Take 1 tablet (20 mEq total) by mouth daily. (Patient taking differently: Take 20 mEq by mouth at bedtime. )  . promethazine (PHENERGAN) 25 MG suppository Place 25 mg rectally every 6 (six) hours as needed for nausea or vomiting.   Allergies: Allergies as of 10/20/2018  . (No Known Allergies)   Past Medical History:  Diagnosis Date  . Anemia   . CAD (coronary artery disease)   . Cardiomyopathy, ischemic 06/24/2014  . Diabetes mellitus without complication ([place])   . DKA (diabetic ketoacidoses) ([place]) 05/14/2016  . End stage renal disease ([place])   . Foot drop, left foot   . GERD (gastroesophageal reflux disease)   . H/O kidney transplant   . Hyperlipidemia   . Hypertension   . Myocardial infarction ([place])   . Pancreas transplanted ([place])   . Renal disorder   . 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 S/P mitral valve repair 07/18/14   Family History  Problem Relation Age of Onset  . CAD Mother   . Stroke Mother   . Diabetes Mother   . COPD Father   . Diabetes Sister   . Chronic Renal Failure Sister   . Diabetes Brother   . Colon cancer Neg Hx    Social History   Socioeconomic History  . Marital status: Divorced    Spouse name: Not on file  . Number of children: 1  . Years of education: Not on file  . Highest education level: Not on file  Occupational History  . 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 Financial resource strain: Not on file  . Food insecurity    Worry: Not on file    Inability: Not on file  . Transportation needs    Medical: Not on file    Non-medical: Not on file  Tobacco Use  . Smoking status: Never Smoker  . Smokeless  tobacco: Never Used  Substance and Sexual Activity  . Alcohol use: No    Alcohol/week: 0.0 standard drinks  . Drug use: No  . Sexual activity: Not Currently    Birth control/protection: None  Lifestyle  . Physical activity    Days per week: Not on file    Minutes per session: Not on file  . Stress: Not on file  Relationships  . Social Herbalist on phone: Not on file    Gets together: Not on file    Attends religious service: Not on file    Active member of club or organization: Not on file  Attends meetings of clubs or organizations: Not on file    Relationship status: Not on file  . Intimate partner violence    Fear of current or ex partner: Not on file    Emotionally abused: Not on file    Physically abused: Not on file    Forced sexual activity: Not on file  Other Topics Concern  . Not on file  Social History Narrative  . Not on file   Review of Systems: A complete ROS was negative except as per HPI.   Physical Exam: Blood pressure (!) 112/56, pulse 82, temperature 98 F (36.7 C), temperature source Oral, resp. rate 14, weight 52.1 kg, SpO2 97 %.  Physical Exam  Constitutional: She is oriented to person, place, and time and well-developed, well-nourished, and in no distress.  Uncomfortable, weak appearing female  HENT:  Head: Normocephalic and atraumatic.  Eyes: Conjunctivae are normal.  Cardiovascular: Normal rate and intact distal pulses. Exam reveals gallop.  Pulmonary/Chest: Effort normal and breath sounds normal. No respiratory distress. She has no wheezes. She has no rales.  Abdominal: Soft. Bowel sounds are normal. She exhibits no distension. There is no abdominal tenderness. There is no guarding.  PD catheter site clean and dry  Musculoskeletal:        General: No tenderness or edema.  Neurological: She is alert and oriented to person, place, and time.  Skin: Skin is warm and dry.  Psychiatric: Mood, memory, affect and judgment normal.     EKG: personally reviewed my interpretation is PVC's, RBBB  CXR: personally reviewed my interpretation is moderate left-sided pleural effusion, left basilar airspace disease, pulmonary vascular congestion- similar to prior.   Assessment & Plan by Problem: Active Problems:   Symptomatic hypotension  [name] is a 48 y.o female with CAD with ischemic cardiomyopathy, DM, ESRD on peritoneal dialysis, HTN, and HLD presenting to the ED with shortness of breath and hypotension during peritoneal dialysis.  Symptomatic hypotension: Blood pressures reportedly in the 38'L systolic, initially started on an epinephrine drip. Lactic acid elevated at 5, this could be in part due to ESRD as well as epinephrine. Chest xray read PNA but this appears similar to prior chest xray and no other s/sx of infection. Possibly due to anorexia and PD leading to severe dehydration. Will hydrate with IVF.  - IVF  - Azithromycin and Ceftriaxone  - Cardiac monitoring  - Blood cultures  - Trend lactic acid  - Troponin   Anorexia: 3-4 months of anorexia, reports decreased appetite, n/v. Denies feeling full. Saw GI recently for her symptoms and also stool incontinence and was diagnosed with IBS. Started on diphenoxylate-atropine. She is also on amitriptyline which combined with the other new medication could be causing n/v and leading to worsening dehydration, weakness and hypokalemia.   Hyponatremia Hypokalemia: <2.0 on admission, repleting orally. Na 128. - KCl 40 meq  - IVF   ESRD on peritoneal dialysis - Consult nephrology for PD  - Avoid nephrotoxic agents   DM: home medications include humalog sliding scale and insulin NPH 10 units qhs - Insulin NPH 5 units qhs  - SSI - CBG monitoring   CAD - Continue aspirin 81 mg   Diet: NPO VTE ppx: Heparin FULL CODE   Dispo: Admit patient to Inpatient with expected length of stay greater than 2 midnights.  Signed: Mike Craze, DO 09/29/2018, 6:33 AM

## 2018-10-09 NOTE — Progress Notes (Signed)
Initial Nutrition Assessment  DOCUMENTATION CODES:   Not applicable, suspect some degree of malnutrition but unable to confirm at this time  INTERVENTION:   - Once able to advance diet, recommend REGULAR  - Once diet is advanced, RD to order snacks TID between meals  - Once diet is advanced, RD to order Nepro Shake po BID, each supplement provides 425 kcal and 19 grams protein  - Once diet is advanced, RD to order Pro-stat 30 ml BID, each supplement provides 100 kcal and 15 grams of protein  - RD will follow up for nutrition education needs  - Continue rena-vit daily  NUTRITION DIAGNOSIS:   Inadequate oral intake related to nausea, vomiting as evidenced by per patient/family report and chart review.  GOAL:   Patient will meet greater than or equal to 90% of their needs  MONITOR:   Diet advancement, Labs, I & O's, Weight trends, Skin  REASON FOR ASSESSMENT:   Consult Assessment of nutrition requirement/status  ASSESSMENT:   48 year old female who presented to the ED on 6/12 with hypotension, weakness, and SOB. PMH of ESRD (HD 2010, panc-renal transplant in 2012, back on dialysis in 2014) on PD, CABG and MV repair, CAD, ischemic cardiomyopathy, T1DM, HTN, HLD. Pt admitted with sepsis possibly secondary to pneumonia.  IR consulted for thoracentesis of left sided pleural effusion. Noted plan for pt to have HD cath placed by IR and receive HD during admission. Noted pt with herpetic lesions.  Unable to see pt at bedside today. Unable to reach pt by phone call.  Reviewed H&P: "Patient states that she was admitted to the hospital in April and since her discharge she has been having poor appetite and decreased oral intake. Patient also complained of associated nausea and vomiting and has been taking antiemetics at home. Patient was evaluated by GI as an outpatient and was diagnosed with IBS and was instructed to eat small frequent meals."  Reviewed weight history in chart. Pt  with progressive weight loss over the last 2 months. Overall, pt has lost 7.7 kg since 08/01/18. This is a 12.9% weight loss which is significant for timeframe. Suspect pt with some degree of malnutrition, likely severe, but unable to confirm without detailed diet history and/or NFPE.  Pt is NPO at this time. Will monitor for diet advancement and provide nutrition interventions as listed above.  Outpatient PD orders: - 6 exchanges per night - 2 L dwell - dwell time 1.5 hours - last fill 0 - daytime exchanges 1 - daytime fill volume 1500 cc x 4 hours  EDW: 54 kg  Medications reviewed and include: Calcitriol, Phoslo 1334 mg TID with snacks, Phoslo 2001 mg TID with meals, SSI, Novolin N 5 units daily, Creon 108,000 units TID with meals, Creon 72,000 units TID with snacks, rena-vit, Protonix, KCl 10 mEq x 5 runs  IVF: NS @ 125 ml/hr  Labs reviewed: potassium 2.0, sodium 128, chloride 89 CBG: 257  NUTRITION - FOCUSED PHYSICAL EXAM:  Unable to complete at this time. Will re-attempt at follow-up.  Diet Order:   Diet Order            Diet NPO time specified  Diet effective now              EDUCATION NEEDS:   Not appropriate for education at this time  Skin:  Skin Assessment: Reviewed RN Assessment, herpetic lesions per MD note  Last BM:  10/16/2018  Height:   Ht Readings from Last 1 Encounters:  08/26/18 5' (1.524 m)    Weight:   Wt Readings from Last 1 Encounters:  10/05/2018 52.1 kg    Ideal Body Weight:  45.5 kg (calculated using height from 08/26/18)  BMI:  Body mass index is 22.43 kg/m.  Estimated Nutritional Needs:   Kcal:  1850-2050  Protein:  80-95 grams  Fluid:  1.9-2.1 L    Gaynell Face, MS, RD, LDN Inpatient Clinical Dietitian Pager: [phone] Weekend/After Hours: [phone]

## 2018-10-09 NOTE — Progress Notes (Signed)
Report called to Apolonio Schneiders, RN 2MW. Pt transferred via bed by RN x2. All belongings sent with patient. BP 77/49 (59), HR 79 NSR BBB.

## 2018-10-09 NOTE — Significant Event (Addendum)
Rapid Response Event Note  Hypotension   I was called by the nurse about patient having ongoing hypotension, RN had already paged IMTS MD and PCCM NP as well. PCCM NP came to bedside, was able to establish 20g PIV with [place] to help with fluid resuscitation.   Patient had already received 1L NS per nurse and SBP were in the 70s with MAPs in the low 50s. Patient was awake, able to converse, appears quite fatigued and weak. Despite getting some IVF resuscitation, patient is still having significant GI loss from frequent large watery BMs. There was some concern of airway protection and respiratory fatigue but currently protecting airway and saturations were 100% on 3L. LA now 3.0 and has improved from 6.0  Plan: - 500 cc NS x 1 for MAP GOAL 50-55, if mental status and/or BP does not improve or meet goal, second 500 cc NS can given. RN will update IMTS  -- 4128 - I called for updated, PCCM will transfer patient to ICU. RNs to transfer patient to [place], [place]

## 2018-10-09 NOTE — Progress Notes (Signed)
Started wit kcl run, after 1 min complained of pain on the iv site, which is not reddened nor swollen. Requested to stop together with ivf of ns. Attempted to to placed in left OS but the same pain and req. Not to start the iv.  IVF ns/kcl stopped, cold compress applied, MD aware.with order.

## 2018-10-09 NOTE — ED Notes (Signed)
ED TO INPATIENT HANDOFF REPORT  ED Nurse Name and Phone #: Percell Locus [phone]  S Name/Age/Gender Priscilla [name] 48 y.o. female Room/Bed: 018C/018C  Code Status   Code Status: Full Code  Home/SNF/Other Home Patient oriented to: self, place, time and situation Is this baseline? Yes   Triage Complete: Triage complete  Chief Complaint Hypotension  Triage Note Pt in via [facility] with hypotension, weakness and sob. Pt states sob started last night, best BP was 60 palp for EMS. Pt reports emesis x 21mo and recent hospital admissions for stomach ulcers, denies any blood in stools. Pt does peritoneal dialysis, has not been able to complete last 2 nights d/t weakness. Pt received 39ml Epi drip via IO access en route   Allergies No Known Allergies  Level of Care/Admitting Diagnosis ED Disposition    ED Disposition Condition [facility] Area: [place] [[id number]]  Level of Care: Progressive [102]  Covid Evaluation: Screening Protocol (No Symptoms)  Diagnosis: Symptomatic hypotension [[id number]]  Admitting Physician: Aldine Contes [[id number]]  Attending Physician: Aldine Contes [phone]  Estimated length of stay: 3 - 4 days  Certification:: I certify this patient will need inpatient services for at least 2 midnights  PT Class (Do Not Modify): Inpatient [101]  PT Acc Code (Do Not Modify): Private [1]       B Medical/Surgery History Past Medical History:  Diagnosis Date  . Anemia   . CAD (coronary artery disease)   . Cardiomyopathy, ischemic 06/24/2014  . Diabetes mellitus without complication ([place])   . DKA (diabetic ketoacidoses) ([place]) 05/14/2016  . End stage renal disease ([place])   . Foot drop, left foot   . GERD (gastroesophageal reflux disease)   . H/O kidney transplant   . Hyperlipidemia   . Hypertension   . Myocardial infarction ([place])   . 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 Renal disorder   . 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 S/P mitral  valve repair 07/18/14   Past Surgical History:  Procedure Laterality Date  . ABDOMINAL AORTOGRAM W/LOWER EXTREMITY N/A 09/19/2017   Procedure: ABDOMINAL AORTOGRAM W/LOWER EXTREMITY;  Surgeon: Angelia Mould, MD;  Location: [place] [facility];  Service: Cardiovascular;  Laterality: N/A;  . CAPD INSERTION N/A 09/08/2013   Procedure: Laparoscopic CAPD peritoneal dialysis catheter placement, possible lysis of adhesions   ;  Surgeon: Adin Hector, MD;  Location: [place];  Service: General;  Laterality: N/A;  . CARDIAC CATHETERIZATION     06/2014  . CESAREAN SECTION    . COMBINED KIDNEY-PANCREAS TRANSPLANT  05/2010   [facility]  Pancreas left mid abdomen  . CORONARY ARTERY BYPASS GRAFT N/A 07/18/2014   Procedure: CORONARY ARTERY BYPASS GRAFTING (CABG)X2 LIMA-LAD; SVG-PD;  Surgeon: Melrose Nakayama, MD;  Location: [place];  Service: Open Heart Surgery;  Laterality: N/A;  . ESOPHAGOGASTRODUODENOSCOPY (EGD) WITH PROPOFOL N/A 07/12/2018   Procedure: ESOPHAGOGASTRODUODENOSCOPY (EGD) WITH PROPOFOL;  Surgeon: Clarene Essex, MD;  Location: [place];  Service: Endoscopy;  Laterality: N/A;  . ESOPHAGOGASTRODUODENOSCOPY (EGD) WITH PROPOFOL N/A 07/13/2018   Procedure: ESOPHAGOGASTRODUODENOSCOPY (EGD) WITH PROPOFOL;  Surgeon: Ronnette Juniper, MD;  Location: [place];  Service: Gastroenterology;  Laterality: N/A;  . ESOPHAGOGASTRODUODENOSCOPY (EGD) WITH PROPOFOL N/A 07/19/2018   Procedure: ESOPHAGOGASTRODUODENOSCOPY (EGD) WITH PROPOFOL;  Surgeon: Ronnette Juniper, MD;  Location: [place];  Service: Gastroenterology;  Laterality: N/A;  . ESOPHAGOGASTRODUODENOSCOPY (EGD) WITH PROPOFOL N/A 07/28/2018   Procedure: ESOPHAGOGASTRODUODENOSCOPY (EGD) WITH PROPOFOL;  Surgeon: Otis Brace, MD;  Location: [facility];  Service: Gastroenterology;  Laterality: N/A;  . FEMORAL ARTERY EXPLORATION Right 07/07/2018   Procedure: REPAIR RIGHT COMMON FEMORAL ARTERY TO FEMORAL VEIN FISTULA  WITH VEIN PATCH  ANGIOPLASTY;  Surgeon: Angelia Mould, MD;  Location: [place];  Service: Vascular;  Laterality: Right;  . GROIN DEBRIDEMENT Right 07/22/2018   Procedure: GROIN DEBRIDEMENT , POSSIBLE VAC;  Surgeon: Rosetta Posner, MD;  Location: [place];  Service: Vascular;  Laterality: Right;  . HEMOSTASIS CLIP PLACEMENT  07/13/2018   Procedure: HEMOSTASIS CLIP PLACEMENT;  Surgeon: Ronnette Juniper, MD;  Location: [place];  Service: Gastroenterology;;  . HOT HEMOSTASIS N/A 07/13/2018   Procedure: HOT HEMOSTASIS (ARGON PLASMA COAGULATION/BICAP);  Surgeon: Ronnette Juniper, MD;  Location: [place];  Service: Gastroenterology;  Laterality: N/A;  . HOT HEMOSTASIS N/A 07/19/2018   Procedure: HOT HEMOSTASIS (ARGON PLASMA COAGULATION/BICAP);  Surgeon: Ronnette Juniper, MD;  Location: [place];  Service: Gastroenterology;  Laterality: N/A;  . INSERTION OF DIALYSIS CATHETER  September 28, 2012   Right upper chest  . LAPAROSCOPIC INSERTION PERITONEAL CATHETER  07/18/2008   Dr Excell Seltzer  . LAPAROSCOPIC REPOSITIONING CAPD CATHETER  08/04/2008   Dr Excell Seltzer  . LAPAROSCOPIC REPOSITIONING CAPD CATHETER  11/08/2008   Dr Excell Seltzer  . LAPAROSCOPIC REPOSITIONING CAPD CATHETER  10/03/2009   Unplugging CAPD catheter Dr Excell Seltzer  . LEFT HEART CATHETERIZATION WITH CORONARY ANGIOGRAM N/A 06/28/2014   Procedure: LEFT HEART CATHETERIZATION WITH CORONARY ANGIOGRAM;  Surgeon: [name], MD;  Location: [place] [facility];  Service: Cardiovascular;  Laterality: N/A;  . MITRAL VALVE REPAIR N/A 07/18/2014   Procedure: MITRAL VALVE REPAIR (MVR);  Surgeon: Melrose Nakayama, MD;  Location: [place];  Service: Open Heart Surgery;  Laterality: N/A;  . OMENTECTOMY  11/08/2008   Dr Excell Seltzer  . PERIPHERAL VASCULAR INTERVENTION Bilateral 09/19/2017   Procedure: PERIPHERAL VASCULAR INTERVENTION;  Surgeon: Angelia Mould, MD;  Location: [place] [facility];  Service: Cardiovascular;  Laterality: Bilateral;  Iliac stents  . SCLEROTHERAPY  07/13/2018   Procedure:  Clide Deutscher;  Surgeon: Ronnette Juniper, MD;  Location: [facility];  Service: Gastroenterology;;  . SCLEROTHERAPY  07/19/2018   Procedure: Clide Deutscher;  Surgeon: Ronnette Juniper, MD;  Location: [place];  Service: Gastroenterology;;  . TEE WITHOUT CARDIOVERSION N/A 07/18/2014   Procedure: TRANSESOPHAGEAL ECHOCARDIOGRAM (TEE);  Surgeon: Melrose Nakayama, MD;  Location: [place];  Service: Open Heart Surgery;  Laterality: N/A;     A IV Location/Drains/Wounds Patient Lines/Drains/Airways Status   Active Line/Drains/Airways    Name:   Placement date:   Placement time:   Site:   Days:   Peripheral IV 09/30/2018 Anterior;Left Other (Comment)   10/03/2018    0258    Other (Comment)   less than 1   Peripheral IV 09/28/2018 Right;Anterior Forearm   10/25/2018    0459    Forearm   less than 1   Negative Pressure Wound Therapy Groin Right   07/22/18    1231    -   79   Incision (Closed) 07/07/18 Groin Right   07/07/18    0929     94   Incision (Closed) 07/22/18 Groin Right   07/22/18    1228     79   Wound / Incision (Open or Dehisced) 07/14/18 Diabetic ulcer Toe (Comment  which one) Right;Posterior   07/14/18    0800    Toe (Comment  which one)   87          Intake/Output Last 24  hours  Intake/Output Summary (Last 24 hours) at 10/22/2018 0729 Last data filed at 10/22/2018 9417 Gross per 24 hour  Intake 100 ml  Output -  Net 100 ml    Labs/Imaging Results for orders placed or performed during the hospital encounter of 10/08/2018 (from the past 48 hour(s))  I-Stat beta hCG blood, ED     Status: None   Collection Time: 10/08/2018  4:07 AM  Result Value Ref Range   I-stat hCG, quantitative <5.0 <5 mIU/mL   Comment 3            Comment:   GEST. AGE      CONC.  (mIU/mL)   <=1 WEEK        5 - 50     2 WEEKS       50 - 500     3 WEEKS       100 - 10,000     4 WEEKS     1,000 - 30,000        FEMALE AND NON-PREGNANT FEMALE:     LESS THAN 5 mIU/mL   Comprehensive metabolic panel     Status: Abnormal    Collection Time: 10/20/2018  4:08 AM  Result Value Ref Range   Sodium 128 (L) 135 - 145 mmol/L   Potassium <2.0 (LL) 3.5 - 5.1 mmol/L    Comment: CRITICAL RESULT CALLED TO, READ BACK BY AND VERIFIED WITH: JASPER,A RN 09/28/2018 0550 JORDANS    Chloride 87 (L) 98 - 111 mmol/L   CO2 20 (L) 22 - 32 mmol/L   Glucose, Bld 256 (H) 70 - 99 mg/dL   BUN 29 (H) 6 - 20 mg/dL   Creatinine, Ser 8.70 (H) 0.44 - 1.00 mg/dL   Calcium 7.6 (L) 8.9 - 10.3 mg/dL   Total Protein 6.1 (L) 6.5 - 8.1 g/dL   Albumin 1.3 (L) 3.5 - 5.0 g/dL   AST 28 15 - 41 U/L   ALT 25 0 - 44 U/L   Alkaline Phosphatase 284 (H) 38 - 126 U/L   Total Bilirubin 1.2 0.3 - 1.2 mg/dL   GFR calc non Af Amer 5 (L) >60 mL/min   GFR calc Af Amer 6 (L) >60 mL/min   Anion gap 21 (H) 5 - 15    Comment: RESULT CHECKED Performed at [facility], [place] [address], [place], [place] [address]   CBC with Differential     Status: Abnormal   Collection Time: 10/07/2018  4:08 AM  Result Value Ref Range   WBC 13.7 (H) 4.0 - 10.5 K/uL   RBC 3.86 (L) 3.87 - 5.11 MIL/uL   Hemoglobin 9.5 (L) 12.0 - 15.0 g/dL   HCT 28.4 (L) 36.0 - 46.0 %   MCV 73.6 (L) 80.0 - 100.0 fL   MCH 24.6 (L) 26.0 - 34.0 pg   MCHC 33.5 30.0 - 36.0 g/dL   RDW 19.1 (H) 11.5 - 15.5 %   Platelets 345 150 - 400 K/uL   nRBC 0.0 0.0 - 0.2 %   Neutrophils Relative % 93 %   Neutro Abs 12.7 (H) 1.7 - 7.7 K/uL   Lymphocytes Relative 3 %   Lymphs Abs 0.4 (L) 0.7 - 4.0 K/uL   Monocytes Relative 3 %   Monocytes Absolute 0.4 0.1 - 1.0 K/uL   Eosinophils Relative 0 %   Eosinophils Absolute 0.0 0.0 - 0.5 K/uL   Basophils Relative 0 %   Basophils Absolute 0.0 0.0 - 0.1 K/uL  Immature Granulocytes 1 %   Abs Immature Granulocytes 0.10 (H) 0.00 - 0.07 K/uL    Comment: Performed at [facility], [place] [address], [place], [place] [address]  Lactic acid, plasma     Status: Abnormal   Collection Time: 10/08/2018  4:08 AM  Result Value Ref Range   Lactic Acid, Venous 5.0 (HH) 0.5 -  1.9 mmol/L    Comment: CRITICAL RESULT CALLED TO, READ BACK BY AND VERIFIED WITH: JASPER,A RN 10/19/2018 0545 JORDANS Performed at [facility], [place] [address] [place], [place] [address]    Dg Chest Port 1 View  Result Date: 10/27/2018 CLINICAL DATA:  Shortness of breath. Chronic kidney disease with peritoneal dialysis. EXAM: PORTABLE CHEST 1 VIEW COMPARISON:  One-view chest x-ray 07/31/2018 FINDINGS: The heart size is normal. Mitral annular repair is again noted. Atherosclerotic changes are noted at the aortic arch. Moderate left-sided pleural effusion is slightly decreased compared to the prior exam. Left basilar airspace opacities are present. The right lung is clear. Mild pulmonary vascular congestion is present. IMPRESSION: 1. Moderate left-sided pleural effusion. 2. Left basilar airspace disease with air bronchograms. Findings are concerning for infection. 3. Mild pulmonary vascular congestion. Electronically Signed   By: San Morelle M.D.   On: 10/17/2018 04:29    Pending Labs Unresulted Labs (From admission, onward)    Start     Ordered   10/10/18 0500  CBC  Tomorrow morning,   R     10/04/2018 0633   10/27/2018 6767  Basic metabolic panel  Once-Timed,   STAT     10/24/2018 0648   10/20/2018 0639  Troponin I - Now Then Q6H  Now then every 6 hours,   R (with STAT occurrences)     10/18/2018 0638   10/25/2018 0548  SARS Coronavirus 2  Once,   R     10/27/2018 0548   09/28/2018 0533  Culture, blood (routine x 2)  BLOOD CULTURE X 2,   STAT     10/10/2018 0532   10/18/2018 0406  Lactic acid, plasma  Now then every 2 hours,   STAT     10/10/2018 0406   10/20/2018 0403  Urinalysis, Routine w reflex microscopic  Once,   STAT     10/03/2018 0403          Vitals/Pain Today's Vitals   10/14/2018 0500 10/01/2018 0515 10/13/2018 0530 10/02/2018 0700  BP: (!) 107/51 122/63 (!) 112/56 100/85  Pulse:      Resp: 15 (!) 24 14 19   Temp:      TempSrc:      SpO2:      Weight:        Isolation  Precautions No active isolations  Medications Medications  azithromycin (ZITHROMAX) 500 mg in sodium chloride 0.9 % 250 mL IVPB (500 mg Intravenous New Bag/Given 10/10/2018 0653)  sodium chloride 0.9 % bolus 500 mL (500 mLs Intravenous New Bag/Given 09/29/2018 0615)    And  sodium chloride 0.9 % bolus 250 mL (has no administration in time range)  aspirin chewable tablet 81 mg (has no administration in time range)  calcitRIOL (ROCALTROL) capsule 0.5 mcg (has no administration in time range)  insulin NPH Human (NOVOLIN N) injection 5 Units (has no administration in time range)  calcium acetate (PHOSLO) capsule 1,334-2,001 mg (has no administration in time range)  lipase/protease/amylase (CREON) capsule 72,000-108,000 Units (has no administration in time range)  pantoprazole (PROTONIX) EC tablet 40 mg (has no administration in  time range)  gabapentin (NEURONTIN) capsule 100 mg (has no administration in time range)  multivitamin (RENA-VIT) tablet 1 tablet (has no administration in time range)  potassium chloride SA (K-DUR) CR tablet 20 mEq (has no administration in time range)  heparin injection 5,000 Units (has no administration in time range)  acetaminophen (TYLENOL) tablet 650 mg (has no administration in time range)    Or  acetaminophen (TYLENOL) suppository 650 mg (has no administration in time range)  ondansetron (ZOFRAN) tablet 4 mg (has no administration in time range)    Or  ondansetron (ZOFRAN) injection 4 mg (has no administration in time range)  insulin aspart (novoLOG) injection 0-9 Units (has no administration in time range)  insulin aspart (novoLOG) injection 0-5 Units (has no administration in time range)  cefTRIAXone (ROCEPHIN) 1 g in sodium chloride 0.9 % 100 mL IVPB (0 g Intravenous Stopped 10/01/2018 0638)  HYDROmorphone (DILAUDID) tablet 1 mg (1 mg Oral Given 09/30/2018 0639)  potassium chloride SA (K-DUR) CR tablet 40 mEq (40 mEq Oral Given 10/15/2018 8979)     Mobility walks Moderate fall risk   Focused Assessments Renal Assessment Handoff:  Hemodialysis Schedule: Hemodialysis Schedule: Tuesday/Thursday/Saturday Last Hemodialysis date and time:    Restricted appendage: left periteneal      R Recommendations: See Admitting Provider Note  Report given to:   Additional Notes:

## 2018-10-09 NOTE — Progress Notes (Signed)
Pt. With Rt leg I/O.  D/C'd I/O with some difficulty.  Area drsg with Vaseline gauze and 2x2.

## 2018-10-09 NOTE — Consult Note (Signed)
[place] Nurse wound consult note Patient receiving care in [facility] 2C12.   Reason for Consult: "Pressure ulcers on buttocks" Wound type: These are NOT pressure injuries.  The patient was diagnosed by a dermatologist, according to Dr. 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  I agree.  The areas are scattered, round or oval in shape. Several have necrotic tissue in the wound beds, others are pink in the base.  I have spoken with Dr. Jerl Mina about these areas and expressed my concerns about these being herpetic lesions that need pharmaceutical care.  She will make decisions on this and enter orders appropriately.  I have also requested she or another MD on the team to look at the wounds.  For now, I have order a topical dressing to the areas.  This will not remedy the problem, but will provide protection and opportunity for moist wound care. Dressing procedure/placement/frequency: Cleanse areas on right trochanter and buttocks with soap and water.  Cover all areas with hydrocolloid dressings Kellie Simmering [phone]).  Change daily.  Monitor the wound area(s) for worsening of condition such as: Signs/symptoms of infection,  Increase in size,  Development of or worsening of odor, Development of pain, or increased pain at the affected locations.  Notify the medical team if any of these develop.  Thank you for the consult.  Discussed plan of care with the patient and bedside nurse.  [place] nurse will not follow at this time.  Please re-consult the [place] team if needed.  Val Riles, RN, MSN, CWOCN, CNS-BC, pager [phone]

## 2018-10-09 NOTE — Progress Notes (Signed)
Another RT, Marina Gravel, RRT, unable to obtain ABG. RN notified

## 2018-10-09 NOTE — Progress Notes (Addendum)
Called PCCM Dr. Nelda Marseille and notified of low pressure, low potassium<2, and mild tachypnea in 20s. Patient is conversing with care team and protecting airway.   Was told to call back Dr. Nelda Marseille and update after patient has received 1L NS bolus  STAT bmp ordered to assess potassium level again  Dr. Melvia Heaps (Nephrology) has been notified of the patient being hospitalized   Lars Mage, MD Internal Medicine PGY2 Pager:[phone] 10/08/2018, 8:47 AM

## 2018-10-09 NOTE — ED Triage Notes (Addendum)
Pt in via [facility] with hypotension, weakness and sob. Pt states sob started last night, best BP was 60 palp for EMS. Pt reports emesis x 83mo and recent hospital admissions for stomach ulcers, denies any blood in stools. Pt does peritoneal dialysis, has not been able to complete last 2 nights d/t weakness. Pt received 75ml Epi drip via IO access en route

## 2018-10-09 NOTE — Consult Note (Signed)
[name], MRN:  [id number], DOB:  [birth year], LOS: 0 ADMISSION DATE:  10/13/2018, CONSULTATION DATE:  10/16/2018 REFERRING MD:  Graciella Freer, CHIEF COMPLAINT:  Hypotension and concern for respiratory failure   Brief History   48 year old female with PMH of ESRD who has been having N/V/D for the past 4 days.  Patient had dialysis on Wednesday and symptoms developed Thursday after massive amounts of diarrhea and vomiting that the patient is simply unable to keep up intake with.  She is a PD patient.  K was <2.  IMTS performed a NIF that was <20 and requested PCCM to come in intubate and evaluate for pressors.  History of present illness   48 year old female with PMH of ESRD who has been having N/V/D for the past 4 days.  Patient had dialysis on Wednesday and symptoms developed Thursday after massive amounts of diarrhea and vomiting that the patient is simply unable to keep up intake with.  She is a PD patient.  K was <2.  IMTS performed a NIF that was <20 and requested PCCM to come in intubate and evaluate for pressors.  Past Medical History  ESRD-PD  Significant Hospital Events   6/12 Hypovolemic hypotension  Consults:  Renal PCCM  Procedures:  PIV insertion  Significant Diagnostic Tests:  CXR  Micro Data:  Blood 6/12>>> MRSA 6/12>>>Negative COVID 6/12>>>Negative  Antimicrobials:  Rocephin 6/12>>> Zithromax 6/12>>>   Interim history/subjective:  BP deteriorates when patient defecates large volumes and responds to fluid  Objective   Blood pressure (!) 75/50, pulse 74, temperature 97.7 F (36.5 C), temperature source Oral, resp. rate 18, weight 52.1 kg, SpO2 99 %.        Intake/Output Summary (Last 24 hours) at 10/24/2018 1527 Last data filed at 10/05/2018 3846 Gross per 24 hour  Intake 850 ml  Output -  Net 850 ml   Filed Weights   10/16/2018 0329  Weight: 52.1 kg    Examination: General: Chronically ill appearing female, intermittently lethargic but  awake and protecting her airway HENT: [place]/AT, PERRL, EOM-I and MMM Lungs: CTA bilaterally Cardiovascular: RRR, Nl S1/S2 and -M/R/G Abdomen: Soft, NT, ND and +BS Extremities: -edema and -tenderness Neuro: Mild lethargy, moving all ext to command Skin: GU skin breakdown, patient is sitting in clear liquid that she is claiming is feces  Resolved Hospital Problem list   N/A  Assessment & Plan:  48 year old female with ESRD-PD  Hypotension:  - Aggressive volume resuscitation, patient was literally sitting in a pool of clear liquid she is claiming is her feces.  Will need much more aggressive hydration than ordered.  - Attempt to match ins and outs  - Hold any anti-HTN medications  - No pressors  - No central line, obtain a PICC or a medline (patient is a PD not HD patient)  Acute respiratory failure:  - NIF is an inappropriate measure respiratory failure in an none neuromuscular weakness in comparison to other NIFs in the same patient to give guidance to progress or deterioration not as a single snap shot in a patient is otherwise stable from a respiratory standpoint.  - No intubation at this time  - Monitor for airway protection  - Titrate O2 for sat of 88-92%  Hypokalemia:  - Aggressive replacement  - Frequent checks (thus will need a medline)  - Control loss  Vomiting:  - Zofran  Diarrhea:  - Check stool O/B  - May need to consider calling GI  -  Match I/O  PCCM will sign off, please call back if needed.  Labs   CBC: Recent Labs  Lab 10/07/2018 0408  WBC 13.7*  NEUTROABS 12.7*  HGB 9.5*  HCT 28.4*  MCV 73.6*  PLT 858    Basic Metabolic Panel: Recent Labs  Lab 10/10/2018 0408 10/22/2018 0720 09/30/2018 1138  NA 128* 128* 128*  K <2.0* <2.0* 2.5*  CL 87* 89* 92*  CO2 20* 19* 19*  GLUCOSE 256* 285* 308*  BUN 29* 29* 29*  CREATININE 8.70* 8.71* 8.28*  CALCIUM 7.6* 7.1* 6.7*  MG  --  1.2*  --    GFR: Estimated Creatinine Clearance: 6.1 mL/min (A) (by C-G  formula based on SCr of 8.28 mg/dL (H)). Recent Labs  Lab 10/03/2018 0408 10/18/2018 0612  WBC 13.7*  --   LATICACIDVEN 5.0* 4.3*    Liver Function Tests: Recent Labs  Lab 10/15/2018 0408  AST 28  ALT 25  ALKPHOS 284*  BILITOT 1.2  PROT 6.1*  ALBUMIN 1.3*   No results for input(s): LIPASE, AMYLASE in the last 168 hours. No results for input(s): AMMONIA in the last 168 hours.  ABG    Component Value Date/Time   PHART 7.269 (L) 07/19/2014 0247   PCO2ART 41.0 07/19/2014 0247   PO2ART 83.0 07/19/2014 0247   HCO3 27.2 07/31/2018 1428   TCO2 29 07/31/2018 1428   ACIDBASEDEF 7.0 (H) 05/14/2016 1537   ACIDBASEDEF 7.0 (H) 05/14/2016 1537   O2SAT 55.0 07/31/2018 1428     Coagulation Profile: Recent Labs  Lab 10/14/2018 1138  INR 1.7*    Cardiac Enzymes: Recent Labs  Lab 10/14/2018 0720  TROPONINI 0.20*    HbA1C: Hgb A1c MFr Bld  Date/Time Value Ref Range Status  06/30/2018 03:20 PM 11.0 (H) 4.8 - 5.6 % Final    Comment:    (NOTE) Pre diabetes:          5.7%-6.4% Diabetes:              >6.4% Glycemic control for   <7.0% adults with diabetes   05/14/2016 07:07 PM 15.0 (H) 4.8 - 5.6 % Final    Comment:    (NOTE)         Pre-diabetes: 5.7 - 6.4         Diabetes: >6.4         Glycemic control for adults with diabetes: <7.0     CBG: Recent Labs  Lab 10/02/2018 1112  [place]*    Review of Systems:   N/V/D, dizziness, diffuse weakness, -fever/chills/cough, negative other than above  Past Medical History  She,  has a past medical history of Anemia, CAD (coronary artery disease), Cardiomyopathy, ischemic (06/24/2014), Diabetes mellitus without complication ([place]), DKA (diabetic ketoacidoses) ([place]) (05/14/2016), End stage renal disease ([place]), Foot drop, left foot, GERD (gastroesophageal reflux disease), H/O kidney transplant, Hyperlipidemia, Hypertension, Myocardial infarction ([place]), Pancreas transplanted ([place]), Renal disorder, 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   Surgical History    Past Surgical History:  Procedure Laterality Date  . ABDOMINAL AORTOGRAM W/LOWER EXTREMITY N/A 09/19/2017   Procedure: ABDOMINAL AORTOGRAM W/LOWER EXTREMITY;  Surgeon: Angelia Mould, MD;  Location: [place] [facility];  Service: Cardiovascular;  Laterality: N/A;  . CAPD INSERTION N/A 09/08/2013   Procedure: Laparoscopic CAPD peritoneal dialysis catheter placement, possible lysis of adhesions   ;  Surgeon: Adin Hector, MD;  Location: [place];  Service: General;  Laterality: N/A;  . CARDIAC CATHETERIZATION  06/2014  . CESAREAN SECTION    . COMBINED KIDNEY-PANCREAS TRANSPLANT  05/2010   [facility]..  Pancreas left mid abdomen  . CORONARY ARTERY BYPASS GRAFT N/A 07/18/2014   Procedure: CORONARY ARTERY BYPASS GRAFTING (CABG)X2 LIMA-LAD; SVG-PD;  Surgeon: Melrose Nakayama, MD;  Location: [place];  Service: Open Heart Surgery;  Laterality: N/A;  . ESOPHAGOGASTRODUODENOSCOPY (EGD) WITH PROPOFOL N/A 07/12/2018   Procedure: ESOPHAGOGASTRODUODENOSCOPY (EGD) WITH PROPOFOL;  Surgeon: Clarene Essex, MD;  Location: [place];  Service: Endoscopy;  Laterality: N/A;  . ESOPHAGOGASTRODUODENOSCOPY (EGD) WITH PROPOFOL N/A 07/13/2018   Procedure: ESOPHAGOGASTRODUODENOSCOPY (EGD) WITH PROPOFOL;  Surgeon: Ronnette Juniper, MD;  Location: [place];  Service: Gastroenterology;  Laterality: N/A;  . ESOPHAGOGASTRODUODENOSCOPY (EGD) WITH PROPOFOL N/A 07/19/2018   Procedure: ESOPHAGOGASTRODUODENOSCOPY (EGD) WITH PROPOFOL;  Surgeon: Ronnette Juniper, MD;  Location: [place];  Service: Gastroenterology;  Laterality: N/A;  . ESOPHAGOGASTRODUODENOSCOPY (EGD) WITH PROPOFOL N/A 07/28/2018   Procedure: ESOPHAGOGASTRODUODENOSCOPY (EGD) WITH PROPOFOL;  Surgeon: Otis Brace, MD;  Location: [facility];  Service: Gastroenterology;  Laterality: N/A;  . FEMORAL ARTERY EXPLORATION Right 07/07/2018   Procedure: REPAIR RIGHT COMMON FEMORAL ARTERY TO FEMORAL VEIN  FISTULA  WITH VEIN PATCH ANGIOPLASTY;  Surgeon: Angelia Mould, MD;  Location: [place];  Service: Vascular;  Laterality: Right;  . GROIN DEBRIDEMENT Right 07/22/2018   Procedure: GROIN DEBRIDEMENT , POSSIBLE VAC;  Surgeon: Rosetta Posner, MD;  Location: [place];  Service: Vascular;  Laterality: Right;  . HEMOSTASIS CLIP PLACEMENT  07/13/2018   Procedure: HEMOSTASIS CLIP PLACEMENT;  Surgeon: Ronnette Juniper, MD;  Location: [place];  Service: Gastroenterology;;  . HOT HEMOSTASIS N/A 07/13/2018   Procedure: HOT HEMOSTASIS (ARGON PLASMA COAGULATION/BICAP);  Surgeon: Ronnette Juniper, MD;  Location: [place];  Service: Gastroenterology;  Laterality: N/A;  . HOT HEMOSTASIS N/A 07/19/2018   Procedure: HOT HEMOSTASIS (ARGON PLASMA COAGULATION/BICAP);  Surgeon: Ronnette Juniper, MD;  Location: [place];  Service: Gastroenterology;  Laterality: N/A;  . INSERTION OF DIALYSIS CATHETER  September 28, 2012   Right upper chest  . LAPAROSCOPIC INSERTION PERITONEAL CATHETER  07/18/2008   Dr Excell Seltzer  . LAPAROSCOPIC REPOSITIONING CAPD CATHETER  2020/12/2208   Dr Excell Seltzer  . LAPAROSCOPIC REPOSITIONING CAPD CATHETER  11/08/2008   Dr Excell Seltzer  . LAPAROSCOPIC REPOSITIONING CAPD CATHETER  10/03/2009   Unplugging CAPD catheter Dr Excell Seltzer  . LEFT HEART CATHETERIZATION WITH CORONARY ANGIOGRAM N/A 06/28/2014   Procedure: LEFT HEART CATHETERIZATION WITH CORONARY ANGIOGRAM;  Surgeon: [name], MD;  Location: [place] [facility];  Service: Cardiovascular;  Laterality: N/A;  . MITRAL VALVE REPAIR N/A 07/18/2014   Procedure: MITRAL VALVE REPAIR (MVR);  Surgeon: Melrose Nakayama, MD;  Location: [place];  Service: Open Heart Surgery;  Laterality: N/A;  . OMENTECTOMY  11/08/2008   Dr Excell Seltzer  . PERIPHERAL VASCULAR INTERVENTION Bilateral 09/19/2017   Procedure: PERIPHERAL VASCULAR INTERVENTION;  Surgeon: Angelia Mould, MD;  Location: [place] [facility];  Service: Cardiovascular;  Laterality: Bilateral;  Iliac stents  . SCLEROTHERAPY   07/13/2018   Procedure: Clide Deutscher;  Surgeon: Ronnette Juniper, MD;  Location[facility];  Service: Gastroenterology;;  . SCLEROTHERAPY  07/19/2018   Procedure: Clide Deutscher;  Surgeon: Ronnette Juniper, MD;  Location: [place];  Service: Gastroenterology;;  . TEE WITHOUT CARDIOVERSION N/A 07/18/2014   Procedure: TRANSESOPHAGEAL ECHOCARDIOGRAM (TEE);  Surgeon: Melrose Nakayama, MD;  Location: [place];  Service: Open Heart Surgery;  Laterality: N/A;     Social History   reports that she has never smoked. She has  never used smokeless tobacco. She reports that she does not drink alcohol or use drugs.   Family History   Her family history includes CAD in her mother; COPD in her father; Chronic Renal Failure in her sister; Diabetes in her brother, mother, and sister; Stroke in her mother. There is no history of Colon cancer.   Allergies No Known Allergies   Home Medications  Prior to Admission medications   Medication Sig Start Date End Date Taking? Authorizing Provider  acetaminophen (TYLENOL) 325 MG tablet Take 650 mg by mouth every 4 (four) hours as needed for mild pain.   Yes [provider]  amitriptyline (ELAVIL) 10 MG tablet Take 10 mg by mouth at bedtime.  01/19/18  Yes [provider]  aspirin 81 MG chewable tablet CHEW AND SWALLOW 1 TABLET(81 MG) BY MOUTH DAILY Patient taking differently: Chew 81 mg by mouth daily.  09/08/18  Yes Chundi, Vahini, MD  calcitRIOL (ROCALTROL) 0.5 MCG capsule Take 0.5 mcg by mouth at bedtime.    Yes [provider]  calcium acetate (PHOSLO) 667 MG capsule Take 1,334-2,001 mg by mouth See admin instructions. Take 2,001 mg by mouth three times a day with meals and 1,334 mg with each snack   Yes [provider]  diphenoxylate-atropine (LOMOTIL) 2.5-0.025 MG tablet Take 1 tablet by mouth 4 (four) times daily as needed for diarrhea or loose stools.   Yes [provider]  gabapentin (NEURONTIN) 100 MG capsule Take 100 mg  by mouth 3 (three) times daily.   Yes [provider]  gentamicin cream (GARAMYCIN) 0.1 % Apply 1 application topically See admin instructions. Apply topically to dialysis site daily after showering   Yes [provider]  HYDROmorphone (DILAUDID) 2 MG tablet Take 1 tablet (2 mg total) by mouth every 6 (six) hours as needed for moderate pain or severe pain. 09/22/18  Yes Lorella Nimrod, MD  insulin lispro (HUMALOG) 100 UNIT/ML injection Inject 1-10 Units into the skin See admin instructions. Inject 1-10 units into the skin three times a day before meals, per sliding scale   Yes [provider]  insulin NPH Human (HUMULIN N,NOVOLIN N) 100 UNIT/ML injection Inject 0.1 mLs (10 Units total) into the skin at bedtime. 07/24/18  Yes Asencion Noble, MD  levonorgestrel (MIRENA) 20 MCG/24HR IUD 1 each by Intrauterine route once. Implanted March 2018   Yes [provider]  lipase/protease/amylase (CREON) 36000 UNITS CPEP capsule Take 72,000-108,000 Units by mouth See admin instructions. Take 108,000 units by mouth three times a day with meals and 72,000 units with each snack   Yes [provider]  multivitamin (RENA-VIT) TABS tablet Take 1 tablet by mouth at bedtime.   Yes [provider]  mupirocin cream (BACTROBAN) 2 % Apply 1 application topically daily.   Yes [provider]  ondansetron (ZOFRAN) 4 MG tablet Take 4 mg by mouth every 8 (eight) hours as needed for nausea or vomiting.   Yes [provider]  pantoprazole (PROTONIX) 40 MG tablet TAKE 1 TABLET BY MOUTH TWICE DAILY Patient taking differently: Take 40 mg by mouth daily.  09/28/18  Yes Chundi, Vahini, MD  potassium chloride SA (K-DUR,KLOR-CON) 20 MEQ tablet Take 1 tablet (20 mEq total) by mouth daily. Patient taking differently: Take 20 mEq by mouth at bedtime.  07/03/18 10/09/27 Yes Almyra Deforest, PA  promethazine (PHENERGAN) 25 MG suppository Place 25 mg rectally every 6 (six) hours as  needed for nausea or vomiting.   Yes  [provider]    The patient is critically ill with multiple organ systems failure and requires high complexity decision making for assessment and support, frequent evaluation and titration of therapies, application of advanced monitoring technologies and extensive interpretation of multiple databases.   Critical Care Time devoted to patient care services described in this note is  45  Minutes. This time reflects time of care of this signee Dr Jennet Maduro. This critical care time does not reflect procedure time, or teaching time or supervisory time of PA/NP/Med student/Med Resident etc but could involve care discussion time.  Rush Farmer, M.D. [facility] Pulmonary/Critical Care Medicine. Pager: [phone]. After hours pager: [phone].

## 2018-10-09 NOTE — Progress Notes (Signed)
Hypoglycemic Event  CBG: 34  Treatment: D50 50 mL (25 gm)  Symptoms: Shaky  Follow-up CBG: Time:91 CBG Result: 0007  Possible Reasons for Event: Inadequate meal intake      Priscilla [name]

## 2018-10-09 NOTE — Progress Notes (Addendum)
Subjective:  Patient reports that she has not been doing well, she had not been able to eat since her last discharge. She also reports nausea. Today her breathing got worse, it's been getting worse over the past few days. She has generalized weakness.  She also reports pain in near her ulcers.   Objective:  Vital signs in last 24 hours: Vitals:   10/19/2018 0500 10/24/2018 0515 10/10/2018 0530 09/30/2018 0700  BP: (!) 107/51 122/63 (!) 112/56 100/85  Pulse:      Resp: 15 (!) 24 14 19   Temp:      TempSrc:      SpO2:      Weight:       Physical Exam  Constitutional:  Tired appearing female, laying in bed, moaning and in acute distress HENT:  Head: Normocephalic and atraumatic.  Eyes: Conjunctivae are normal.  Cardiovascular: Normal rate, regular rhythm and normal heart sounds.  Respiratory: Effort normal and breath sounds normal. No respiratory distress.  Decreased breath sounds on left side GI: Soft. Bowel sounds are normal. No distension. There is no tenderness.  Musculoskeletal: No edema.  Neurological: Is alert.  Skin: Pressure ulcerations in bilateral buttocks and over hip area (photos below) Psychiatric: Normal mood and affect. Behavior is normal. Judgment and thought content normal.           Assessment/Plan:  This is a 48 year old female with a history of ESRD on PD, HTN, CAD s/p CABG with ischemic cardiomyopathy, Mitral valve disease, Type 1 DM who presented with shortness of breath, hypotension, and 1 month hx of decreased appetite.   Sepsis  Community Acquired Pneumonia Patient presented with hypotension (required epinephrine drip in EMS), lactic acidosis of 5, mild leukocytosis of 13.7 and end organ damage (renal cr 8.7 from bl 7-8, cardiac troponin 0.2). Source is likely pulmonary vs skin vs PD site infection. CXR showing left lower lobe consolidation vs effusion.   -blood cultures pending, no growth for 12 hrs so far  -pending ua -will get NIF and ABG to assess  for hypercarbia and respiratory distress  -trending troponin -following lactic acid level -continue ceftriaxone day 1/5 for CAP coverage -continue azithromycin day 1/5 for CAP coverage  -nutrition consult  -IR consulted for thoracentesis of left sided pleural effusion  ESRD on PD s/p renal transplant in 2012 which failed  Hx of AVF right infection Has been on HD since 2010 and back on PD in 2014 after renal transplant rejection. Patient does not have fistula or access site.   -IR to place HD cath  -Plan for HD 6/12 pm or 6/13 am  Severe hypokalemia K of <2 on admission. Etiology is likely secondary to peritoneal dialysis and also from poor po intake over the past 1 month. Received oral 71meq potassium so far.   -Added on IV potassium 26meq along with oral 80meq -Will continue to repeat bmp q4 hours -pending magnesium  Hypovolemic hyponatremia  Na=128 on admission.   -Repleting with NS  Pancreatic insuffiencey s/p pancreatic transplant in 2012  -continue creon  Type 1 DM Patient's blood glucose has ranged 250-280s. At home the patient takes NPH 10units qhs.   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 She has some clear drainage from the sites.   -Wound care consult  -  Will get records from dermatology -No isolation precautions needed at this time as lesions do not appear active   CAD s/p CABGx2 (LIMA to LAD, SVG to PDA in 2016 by Dr. Roxan Hockey) Mitral regurgitation s/p mitral valve repair and PFO closure 07/18/2014  Hx of Upper GI bleed   -Continue pantoprazole 40mg  bid  Dispo: Anticipated discharge in approximately 2-3 day(s).   Lars Mage, MD Internal Medicine PGY2 YBRKV:[phone] 09/29/2018, 10:54 AM

## 2018-10-09 NOTE — Progress Notes (Addendum)
1515: RR paged. Puja, RN at bedside 1517.  Physician notified: Chundi, MD At: 1516  Regarding: BP now 72sys, drowsy. MAP 56 No return response.    Physician notified: Brandi NP, CCM At: 1518  Regarding: BP 70sys. In trendelenburg. Drowsy  Now. IVF infusing at 125 through 22g PIV. Attempting second PIV now.  Awaiting return response.   Returned Response at: 1518  Order(s): bolus 577ml--unable to give d/t restricted access.  1521: Brandi at bedside.   1522: repaged Dr. Maricela Bo with IMTS.   1539:CCM placing second PIV with u/s. Much appreciated.  Venous blood gas reported to Brandi. PH 7.314, O2 undetectable.   Orders from CCM: give 500 ml bolus now.                   MAP goal 55-60 or mental status change give second 500 ml bolus.   1555: IMTS returned call. Updated.    Physician notified: Chundi At: 1638  Regarding: BP 80sys currently after 558ml bolus.  Pt has had 2 large liquid BM--ok for flexiseal to protect open wounds on buttock?  IV team states no midline or PICC d/t renal patient with plans for HD, would need central line.  Lesions on buttock, Left hip and posterior left knee are open, moist and some oozing grey thick drainage. Very painful for patient. One lesion on buttock appear to be tunneled and unstageable.  IR was at bedside for HD cath consent when pt became more drowsy with BP 73sys. Unable to bring down for lining due to stability. RN attempted to call IR with update, informed PA/MD are not in hospital at this time. IMTS would have to call IR physicians.  K+ is 2.4 all potassium supplements have been administered. Awaiting return response.   Returned Response at: 1639  Order(s): flexiseal order. Wounds were dry when assessed by team earlier today. Getting records from pt dermatologist, lesions are not in a contagious state and will be reassessed in the AM.   RN to continue to monitor patient.

## 2018-10-09 NOTE — Progress Notes (Signed)
Admission from the ED awake and alert. 

## 2018-10-09 NOTE — ED Provider Notes (Signed)
[facility] EMERGENCY DEPARTMENT Provider Note   CSN: [id number] Arrival date & time: 09/28/2018  9675     History   Chief Complaint Chief Complaint  Patient presents with   Hypotension   Shortness of Breath   Fatigue    HPI Priscilla [name] is a 48 y.o. female.   The history is provided by the patient.  Shortness of Breath She has history of hypertension, diabetes, hyperlipidemia, coronary artery disease with ischemic cardiomyopathy, end-stage renal disease on peritoneal dialysis and comes in because of weakness and difficulty breathing.  She states that for the last month, she has had anorexia and has not been able to eat very much.  She did talk with her PCP about this and was just told to eat more.  Last night, she felt very weak and did not feel that she could do her peritoneal dialysis.  Tonight, she did to her dialysis, but had to stop after about half the time when she developed difficulty breathing.  She denies chest pain, heaviness, tightness, pressure.  She does complain of some generalized body aches.  She denies fever, chills, sweats.  She denies exposure to COVID-19.  She has had problems with sores on her body which a dermatologist said were ulcers.  EMS came to pick her up and noted low blood pressure and laced an intraosseous line in her left leg and started an epinephrine drip.  Blood pressures reported to be 60 systolic.  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   S/P mitral valve repair  07/18/14    Patient Active Problem List   Diagnosis Date Noted   Skin lesions 09/22/2018   Weakness    ESRD on peritoneal dialysis [facility])    PVD (peripheral vascular disease) ([place])    Open wound of right hip and thigh with complication    Malnutrition of moderate degree 07/17/2018   Anemia associated with acute blood loss    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[place]) 07/07/2018   History of mitral valve annuloplasty #28 Memo ring 08/02/2016   Mixed hyperlipidemia 08/02/2016   Elevation of cardiac enzymes    Symptomatic anemia 05/14/2016   CHF (congestive heart failure) ([place])    Essential hypertension 11/28/2014   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   Mitral regurgitation 06/29/2014   Abnormal EKG    Coronary artery disease involving native coronary artery of native heart without angina pectoris    Cardiomyopathy, ischemic 06/24/2014   Abnormal nuclear cardiac imaging test 06/24/2014   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   ESRD (end stage renal disease) ([place]) 09/24/2012   Diabetes mellitus type 1 ([place]) 09/24/2012   H/O pancreas transplant Jan2012 - Left mid abdomen 09/24/2012   Pancreatitis of pancreas transplant 09/24/2012    Past Surgical History:  Procedure Laterality Date   ABDOMINAL AORTOGRAM W/LOWER EXTREMITY N/A 09/19/2017   Procedure: ABDOMINAL AORTOGRAM W/LOWER EXTREMITY;  Surgeon: Angelia Mould, MD;  Location: [facility] CV LAB;  Service: Cardiovascular;  Laterality: N/A;   CAPD INSERTION N/A 09/08/2013   Procedure: Laparoscopic CAPD peritoneal dialysis catheter placement, possible lysis of adhesions   ;  Surgeon: Adin Hector, MD;  Location: [place];  Service: General;  Laterality: N/A;   CARDIAC CATHETERIZATION     06/2014   CESAREAN SECTION     COMBINED KIDNEY-PANCREAS TRANSPLANT  05/2010   [facility]  Pancreas left mid abdomen   CORONARY ARTERY BYPASS GRAFT N/A 07/18/2014   Procedure: CORONARY ARTERY  BYPASS GRAFTING (CABG)X2 LIMA-LAD; SVG-PD;  Surgeon: Melrose Nakayama, MD;  Location: [place];  Service: Open Heart Surgery;  Laterality: N/A;   ESOPHAGOGASTRODUODENOSCOPY (EGD) WITH PROPOFOL N/A 07/12/2018   Procedure: ESOPHAGOGASTRODUODENOSCOPY (EGD) WITH PROPOFOL;  Surgeon: Clarene Essex, MD;  Location: [facility];  Service: Endoscopy;  Laterality: N/A;   ESOPHAGOGASTRODUODENOSCOPY (EGD) WITH PROPOFOL N/A 07/13/2018   Procedure: ESOPHAGOGASTRODUODENOSCOPY (EGD) WITH PROPOFOL;  Surgeon: Ronnette Juniper, MD;  Location: [place];  Service: Gastroenterology;  Laterality: N/A;   ESOPHAGOGASTRODUODENOSCOPY (EGD) WITH PROPOFOL N/A 07/19/2018   Procedure: ESOPHAGOGASTRODUODENOSCOPY (EGD) WITH PROPOFOL;  Surgeon: Ronnette Juniper, MD;  Location: [place];  Service: Gastroenterology;  Laterality: N/A;   ESOPHAGOGASTRODUODENOSCOPY (EGD) WITH PROPOFOL N/A 07/28/2018   Procedure: ESOPHAGOGASTRODUODENOSCOPY (EGD) WITH PROPOFOL;  Surgeon: Otis Brace, MD;  Location: [place];  Service: Gastroenterology;  Laterality: N/A;   FEMORAL ARTERY EXPLORATION Right 07/07/2018   Procedure: REPAIR RIGHT COMMON FEMORAL ARTERY TO FEMORAL VEIN FISTULA  WITH VEIN PATCH ANGIOPLASTY;  Surgeon: Angelia Mould, MD;  Location: [place];  Service: Vascular;  Laterality: Right;   GROIN DEBRIDEMENT Right 07/22/2018   Procedure: GROIN DEBRIDEMENT , POSSIBLE VAC;  Surgeon: Rosetta Posner, MD;  Location: [place];  Service: Vascular;  Laterality: Right;   HEMOSTASIS CLIP PLACEMENT  07/13/2018   Procedure: HEMOSTASIS CLIP PLACEMENT;  Surgeon: Ronnette Juniper, MD;  Location: [place];  Service: Gastroenterology;;   HOT HEMOSTASIS N/A 07/13/2018   Procedure: HOT HEMOSTASIS (ARGON PLASMA COAGULATION/BICAP);  Surgeon: Ronnette Juniper, MD;  Location: [place];  Service: Gastroenterology;  Laterality: N/A;   HOT HEMOSTASIS N/A 07/19/2018   Procedure: HOT HEMOSTASIS (ARGON PLASMA COAGULATION/BICAP);  Surgeon: Ronnette Juniper, MD;   Location: [place];  Service: Gastroenterology;  Laterality: N/A;   INSERTION OF DIALYSIS CATHETER  September 28, 2012   Right upper chest   LAPAROSCOPIC INSERTION PERITONEAL CATHETER  07/18/2008   Dr Excell Seltzer   LAPAROSCOPIC REPOSITIONING CAPD CATHETER  06-07-2008   Dr Excell Seltzer   LAPAROSCOPIC REPOSITIONING CAPD CATHETER  11/08/2008   Dr Excell Seltzer   LAPAROSCOPIC REPOSITIONING CAPD CATHETER  10/03/2009   Unplugging CAPD catheter Dr Excell Seltzer   LEFT HEART CATHETERIZATION WITH CORONARY ANGIOGRAM N/A 06/28/2014   Procedure: LEFT HEART CATHETERIZATION WITH CORONARY ANGIOGRAM;  Surgeon: [name], MD;  Location: [facility] [facility] CATH LAB;  Service: Cardiovascular;  Laterality: N/A;   MITRAL VALVE REPAIR N/A 07/18/2014   Procedure: MITRAL VALVE REPAIR (MVR);  Surgeon: Melrose Nakayama, MD;  Location: [facility];  Service: Open Heart Surgery;  Laterality: N/A;   OMENTECTOMY  11/08/2008   Dr Excell Seltzer   PERIPHERAL VASCULAR INTERVENTION Bilateral 09/19/2017   Procedure: PERIPHERAL VASCULAR INTERVENTION;  Surgeon: Angelia Mould, MD;  Location: [facility] CV LAB;  Service: Cardiovascular;  Laterality: Bilateral;  Iliac stents   SCLEROTHERAPY  07/13/2018   Procedure: SCLEROTHERAPY;  Surgeon: Ronnette Juniper, MD;  Location: [facility] ENDOSCOPY;  Service: Gastroenterology;;   SCLEROTHERAPY  07/19/2018   Procedure: Clide Deutscher;  Surgeon: Ronnette Juniper, MD;  Location: [facility]  ENDOSCOPY;  Service: Gastroenterology;;   TEE WITHOUT CARDIOVERSION N/A 07/18/2014   Procedure: TRANSESOPHAGEAL ECHOCARDIOGRAM (TEE);  Surgeon: Melrose Nakayama, MD;  Location: [facility];  Service: Open Heart Surgery;  Laterality: N/A;     OB History   No obstetric history on file.      Home Medications    Prior to Admission medications   Medication Sig Start Date End Date Taking? Authorizing Provider  acetaminophen (TYLENOL) 325 MG tablet Take 650 mg by mouth every 4 (four) hours as needed for mild pain.    [provider]    amitriptyline (ELAVIL) 10 MG tablet Take 10 mg by mouth at bedtime.  01/19/18   [provider]  aspirin 81 MG chewable tablet CHEW AND SWALLOW 1 TABLET(81 MG) BY MOUTH DAILY 09/08/18   Chundi, Vahini, MD  calcitRIOL (ROCALTROL) 0.5 MCG capsule Take 0.5 mcg by mouth at bedtime.     [provider]  calcium acetate (PHOSLO) 667 MG capsule Take 1,334-2,001 mg by mouth See admin instructions. Take 2,001 mg by mouth three times a day with meals and 1,334 mg with each snack    [provider]  gabapentin (NEURONTIN) 100 MG capsule Take 100 mg by mouth 3 (three) times daily.    [provider]  gentamicin cream (GARAMYCIN) 0.1 % Apply 1 application topically See admin instructions. Apply topically to dialysis site daily after showering    [provider]  HYDROmorphone (DILAUDID) 2 MG tablet Take 1 tablet (2 mg total) by mouth every 6 (six) hours as needed for moderate pain or severe pain. 09/22/18   Lorella Nimrod, MD  insulin lispro (HUMALOG) 100 UNIT/ML injection Inject 1-10 Units into the skin See admin instructions. Inject 1-10 units into the skin three times a day before meals, per sliding scale    [provider]  insulin NPH Human (HUMULIN N,NOVOLIN N) 100 UNIT/ML injection Inject 0.1 mLs (10 Units total) into the skin at bedtime. 07/24/18   Asencion Noble, MD  levonorgestrel (MIRENA) 20 MCG/24HR IUD 1 each by Intrauterine route once. Implanted March 2018    [provider]  lipase/protease/amylase (CREON) 36000 UNITS CPEP capsule Take 72,000-108,000 Units by mouth See admin instructions. Take 108,000 units by mouth three times a day with meals and 72,000 units with each snack    [provider]  loperamide (IMODIUM) 2 MG capsule Take 2 mg by mouth See admin instructions. Take 2 mg by mouth with every dose of Creon    [provider]  multivitamin (RENA-VIT) TABS tablet Take 1 tablet by mouth at bedtime.    [provider]  pantoprazole (PROTONIX) 40 MG tablet TAKE 1 TABLET BY MOUTH TWICE DAILY 09/28/18   Chundi, Vahini, MD  potassium chloride SA (K-DUR,KLOR-CON) 20 MEQ tablet Take 1 tablet (20 mEq total) by mouth daily. Patient taking differently: Take 20 mEq by mouth at bedtime.  07/03/18 10/01/18  Almyra Deforest, PA  Travoprost, BAK Free, (TRAVATAN) 0.004 % SOLN ophthalmic solution Place 1 drop into both eyes at bedtime.    [provider]    Family History Family History  Problem Relation Age of Onset   CAD Mother    Stroke Mother    Diabetes Mother    COPD Father    Diabetes Sister    Chronic Renal Failure Sister    Diabetes Brother    Colon cancer Neg Hx     Social History Social History   Tobacco Use  Smoking status: Never Smoker   Smokeless tobacco: Never Used  Substance Use Topics   Alcohol use: No    Alcohol/week: 0.0 standard drinks   Drug use: No     Allergies   Patient has no known allergies.   Review of Systems Review of Systems  Respiratory: Positive for shortness of breath.   All other systems reviewed and are negative.    Physical Exam Updated Vital Signs BP 139/79    Pulse (!) 50    Resp (!) 24    Wt 52.1 kg    SpO2 97%    BMI 22.43 kg/m   Physical Exam Vitals signs and nursing note reviewed.    48 year old female, resting comfortably and in no acute distress. Vital signs are significant for slow heart rate and elevated respiratory rate. Oxygen saturation is 97%, which is normal. Head is normocephalic and atraumatic. PERRLA, EOMI. Oropharynx is clear. Neck is nontender and supple without adenopathy or JVD. Back is nontender and there is no CVA tenderness. Lungs are clear without rales, wheezes, or rhonchi. Chest is nontender. Heart has regular rate and rhythm without murmur. Abdomen is soft, flat, nontender without masses or hepatosplenomegaly and peristalsis is normoactive.  Peritoneal dialysis catheter is in place. Extremities  have no cyanosis or edema, full range of motion is present.  Intraosseous line in place proximal left tibia. Skin is warm and dry.  Multiple areas of shallow skin ulceration present in various stages of healing-nonspecific in appearance. Neurologic: Mental status is normal, cranial nerves are intact, there are no motor or sensory deficits.  ED Treatments / Results  Labs (all labs ordered are listed, but only abnormal results are displayed) Labs Reviewed  COMPREHENSIVE METABOLIC PANEL - Abnormal; Notable for the following components:      Result Value   Sodium 128 (*)    Potassium <2.0 (*)    Chloride 87 (*)    CO2 20 (*)    Glucose, Bld 256 (*)    BUN 29 (*)    Creatinine, Ser 8.70 (*)    Calcium 7.6 (*)    Total Protein 6.1 (*)    Albumin 1.3 (*)    Alkaline Phosphatase 284 (*)    GFR calc non Af Amer 5 (*)    GFR calc Af Amer 6 (*)    Anion gap 21 (*)    All other components within normal limits  CBC WITH DIFFERENTIAL/PLATELET - Abnormal; Notable for the following components:   WBC 13.7 (*)    RBC 3.86 (*)    Hemoglobin 9.5 (*)    HCT 28.4 (*)    MCV 73.6 (*)    MCH 24.6 (*)    RDW 19.1 (*)    Neutro Abs 12.7 (*)    Lymphs Abs 0.4 (*)    Abs Immature Granulocytes 0.10 (*)    All other components within normal limits  LACTIC ACID, PLASMA - Abnormal; Notable for the following components:   Lactic Acid, Venous 5.0 (*)    All other components within normal limits  CULTURE, BLOOD (ROUTINE X 2)  CULTURE, BLOOD (ROUTINE X 2)  NOVEL CORONAVIRUS, NAA (HOSPITAL ORDER, SEND-OUT TO REF LAB)  URINALYSIS, ROUTINE W REFLEX MICROSCOPIC  LACTIC ACID, PLASMA  I-STAT BETA HCG BLOOD, ED ([facility], [facility], [facility] ONLY)  CBG MONITORING, ED    EKG EKG Interpretation  Date/Time:  Friday October 09 2018 03:22:33 EDT Ventricular Rate:  101 PR Interval:    QRS Duration: 157 QT Interval:  392 QTC Calculation: 477 R Axis:   -116 Text Interpretation:  Sinus or ectopic atrial tachycardia Multiform  ventricular premature complexes RBBB and LAFB When compared with ECG of 08/02/2018, No significant change was found Confirmed by Delora Fuel ([id number]) on 10/24/2018 3:38:31 AM   Radiology Dg Chest Port 1 View  Result Date: 10/03/2018 CLINICAL DATA:  Shortness of breath. Chronic kidney disease with peritoneal dialysis. EXAM: PORTABLE CHEST 1 VIEW COMPARISON:  One-view chest x-ray 07/31/2018 FINDINGS: The heart size is normal. Mitral annular repair is again noted. Atherosclerotic changes are noted at the aortic arch. Moderate left-sided pleural effusion is slightly decreased compared to the prior exam. Left basilar airspace opacities are present. The right lung is clear. Mild pulmonary vascular congestion is present. IMPRESSION: 1. Moderate left-sided pleural effusion. 2. Left basilar airspace disease with air bronchograms. Findings are concerning for infection. 3. Mild pulmonary vascular congestion. Electronically Signed   By: San Morelle M.D.   On: 10/25/2018 04:29    Procedures Procedures  CRITICAL CARE Performed by: Delora Fuel Total critical care time: 50 minutes Critical care time was exclusive of separately billable procedures and treating other patients. Critical care was necessary to treat or prevent imminent or life-threatening deterioration. Critical care was time spent personally by me on the following activities: development of treatment plan with patient and/or surrogate as well as nursing, discussions with consultants, evaluation of patient's response to treatment, examination of patient, obtaining history from patient or surrogate, ordering and performing treatments and interventions, ordering and review of laboratory studies, ordering and review of radiographic studies, pulse oximetry and re-evaluation of patient's condition.  Medications Ordered in ED Medications  cefTRIAXone (ROCEPHIN) 1 g in sodium chloride 0.9 % 100 mL IVPB (has no administration in time range)    azithromycin (ZITHROMAX) 500 mg in sodium chloride 0.9 % 250 mL IVPB (has no administration in time range)  sodium chloride 0.9 % bolus 1,000 mL (has no administration in time range)    And  sodium chloride 0.9 % bolus 500 mL (has no administration in time range)    And  sodium chloride 0.9 % bolus 250 mL (has no administration in time range)  HYDROmorphone (DILAUDID) tablet 1 mg (has no administration in time range)  potassium chloride SA (K-DUR) CR tablet 40 mEq (has no administration in time range)     Initial Impression / Assessment and Plan / ED Course  I have reviewed the triage vital signs and the nursing notes.  Pertinent labs & imaging results that were available during my care of the patient were reviewed by me and considered in my medical decision making (see chart for details).  Anorexia and weakness of uncertain cause.  In the ED, epinephrine drip has been turned off and patient is maintaining adequate blood pressure.  Screening labs are ordered.  Labs show slightly elevated WBC with left shift.  Chest x-ray is suspicious for left lower lobe pneumonia, which might account for her dyspnea.  Lactic acid level has come back elevated.  With possible pneumonia, elevated lactic acid, episode of hypotension, patient meets criteria for sepsis and is started on sepsis pathway.  She started on antibiotics in early, goal-directed fluid therapy.  Case is discussed with Dr. Tarri Abernethy of internal medicine teaching service who agrees to admit the patient.  Final Clinical Impressions(s) / ED Diagnoses   Final diagnoses:  Community acquired pneumonia of left lower lobe of lung (HCC)  Hypotension, unspecified hypotension type  Elevated lactic acid level  End-stage  renal disease on peritoneal dialysis ([place])  Anemia associated with chronic renal failure  Pleural effusion, left    ED Discharge Orders    None       Delora Fuel, MD 24/19/91 [phone]

## 2018-10-09 NOTE — Consult Note (Addendum)
Renal Service Consult Note [facility]  Priscilla [name] 10/25/2018 Sol Blazing Requesting Physician:  Dr Dareen Piano, [name].   Reason for Consult:  ESRD pt N/V hypotension HPI: The patient is a 48 y.o. year-old with hx of DM1, ESRD (HD 2010, panc-renal Tx in 2012, back on dialysis 2014) on PD, h/o CABG and MV repair, presented w/ N/V and anorexia / wt loss over a least 1 month.  In ED K < 2.0 and BP in 60's, improved now to 90's after NS boluses. Po and IV KCL ordered.  Asked to see for esrd.   Pt had esrd started HD in 2010, panc/ renal tx in 2012, went back on dialysis 2014.  States has been on PD except needed HD "for a while to get me back in shape" 1- 2  Yrs ago , they put in a catheter for that.  Does not have AVF (they were going to put one in yrs ago when she got called for Tx).    Main c/o's are anorexia and N/V, no fever/ abd pain or diarrhea.  Fatigue+.  Usual wt's are 124, currently running around 114 lbs. She has talked to DR Joelyn Oms about going back on HD for this.  Alb = 1.3 today.   EKG - NSR w/ RBBB and LAFB and PVC's, no change from prior  ROS  denies CP  no joint pain   no HA  no blurry vision  no rash  no diarrhea  no nausea/ vomiting     Past Medical History  Past Medical History:  Diagnosis Date  . Anemia   . CAD (coronary artery disease)   . Cardiomyopathy, ischemic 06/24/2014  . Diabetes mellitus without complication ([place])   . DKA (diabetic ketoacidoses) ([place]) 05/14/2016  . End stage renal disease ([place])    started dialysis 2010, got panc-renal tx 2012 - 2014. in 2014 went back on dialysis. has been on PD mostly.   . Foot drop, left foot   . GERD (gastroesophageal reflux disease)   . H/O kidney transplant   . Hyperlipidemia   . Hypertension   . Myocardial infarction ([place])   . 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 Renal disorder   . 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 S/P mitral valve repair 07/18/14   Past Surgical History  Past Surgical  History:  Procedure Laterality Date  . ABDOMINAL AORTOGRAM W/LOWER EXTREMITY N/A 09/19/2017   Procedure: ABDOMINAL AORTOGRAM W/LOWER EXTREMITY;  Surgeon: Angelia Mould, MD;  Location: [place] [facility];  Service: Cardiovascular;  Laterality: N/A;  . CAPD INSERTION N/A 09/08/2013   Procedure: Laparoscopic CAPD peritoneal dialysis catheter placement, possible lysis of adhesions   ;  Surgeon: Adin Hector, MD;  Location: [place];  Service: General;  Laterality: N/A;  . CARDIAC CATHETERIZATION     06/2014  . CESAREAN SECTION    . COMBINED KIDNEY-PANCREAS TRANSPLANT  05/2010   [facility]..  Pancreas left mid abdomen  . CORONARY ARTERY BYPASS GRAFT N/A 07/18/2014   Procedure: CORONARY ARTERY BYPASS GRAFTING (CABG)X2 LIMA-LAD; SVG-PD;  Surgeon: Melrose Nakayama, MD;  Location: [place];  Service: Open Heart Surgery;  Laterality: N/A;  . ESOPHAGOGASTRODUODENOSCOPY (EGD) WITH PROPOFOL N/A 07/12/2018   Procedure: ESOPHAGOGASTRODUODENOSCOPY (EGD) WITH PROPOFOL;  Surgeon: Clarene Essex, MD;  Location: [place];  Service: Endoscopy;  Laterality: N/A;  . ESOPHAGOGASTRODUODENOSCOPY (EGD) WITH PROPOFOL N/A 07/13/2018   Procedure: ESOPHAGOGASTRODUODENOSCOPY (EGD) WITH PROPOFOL;  Surgeon: Ronnette Juniper, MD;  Location: [place];  Service: Gastroenterology;  Laterality: N/A;  . ESOPHAGOGASTRODUODENOSCOPY (EGD) WITH PROPOFOL N/A 07/19/2018   Procedure: ESOPHAGOGASTRODUODENOSCOPY (EGD) WITH PROPOFOL;  Surgeon: Ronnette Juniper, MD;  Location: [place];  Service: Gastroenterology;  Laterality: N/A;  . ESOPHAGOGASTRODUODENOSCOPY (EGD) WITH PROPOFOL N/A 07/28/2018   Procedure: ESOPHAGOGASTRODUODENOSCOPY (EGD) WITH PROPOFOL;  Surgeon: Otis Brace, MD;  Location: [facility];  Service: Gastroenterology;  Laterality: N/A;  . FEMORAL ARTERY EXPLORATION Right 07/07/2018   Procedure: REPAIR RIGHT COMMON FEMORAL ARTERY TO FEMORAL VEIN FISTULA  WITH VEIN PATCH ANGIOPLASTY;  Surgeon: Angelia Mould, MD;  Location: [place];  Service: Vascular;  Laterality: Right;  . GROIN DEBRIDEMENT Right 07/22/2018   Procedure: GROIN DEBRIDEMENT , POSSIBLE VAC;  Surgeon: Rosetta Posner, MD;  Location: [place];  Service: Vascular;  Laterality: Right;  . HEMOSTASIS CLIP PLACEMENT  07/13/2018   Procedure: HEMOSTASIS CLIP PLACEMENT;  Surgeon: Ronnette Juniper, MD;  Location: [place];  Service: Gastroenterology;;  . HOT HEMOSTASIS N/A 07/13/2018   Procedure: HOT HEMOSTASIS (ARGON PLASMA COAGULATION/BICAP);  Surgeon: Ronnette Juniper, MD;  Location: [place];  Service: Gastroenterology;  Laterality: N/A;  . HOT HEMOSTASIS N/A 07/19/2018   Procedure: HOT HEMOSTASIS (ARGON PLASMA COAGULATION/BICAP);  Surgeon: Ronnette Juniper, MD;  Location: [place];  Service: Gastroenterology;  Laterality: N/A;  . INSERTION OF DIALYSIS CATHETER  September 28, 2012   Right upper chest  . LAPAROSCOPIC INSERTION PERITONEAL CATHETER  07/18/2008   Dr Excell Seltzer  . LAPAROSCOPIC REPOSITIONING CAPD CATHETER  22-Nov-202010   Dr Excell Seltzer  . LAPAROSCOPIC REPOSITIONING CAPD CATHETER  11/08/2008   Dr Excell Seltzer  . LAPAROSCOPIC REPOSITIONING CAPD CATHETER  10/03/2009   Unplugging CAPD catheter Dr Excell Seltzer  . LEFT HEART CATHETERIZATION WITH CORONARY ANGIOGRAM N/A 06/28/2014   Procedure: LEFT HEART CATHETERIZATION WITH CORONARY ANGIOGRAM;  Surgeon: [name], MD;  Location: [place] CATH LAB;  Service: Cardiovascular;  Laterality: N/A;  . MITRAL VALVE REPAIR N/A 07/18/2014   Procedure: MITRAL VALVE REPAIR (MVR);  Surgeon: Melrose Nakayama, MD;  Location: [place];  Service: Open Heart Surgery;  Laterality: N/A;  . OMENTECTOMY  11/08/2008   Dr Excell Seltzer  . PERIPHERAL VASCULAR INTERVENTION Bilateral 09/19/2017   Procedure: PERIPHERAL VASCULAR INTERVENTION;  Surgeon: Angelia Mould, MD;  Location: [place] [facility];  Service: Cardiovascular;  Laterality: Bilateral;  Iliac stents  . SCLEROTHERAPY  07/13/2018   Procedure: Clide Deutscher;  Surgeon: Ronnette Juniper, MD;   Location: [facility];  Service: Gastroenterology;;  . SCLEROTHERAPY  07/19/2018   Procedure: Clide Deutscher;  Surgeon: Ronnette Juniper, MD;  Location: [place];  Service: Gastroenterology;;  . TEE WITHOUT CARDIOVERSION N/A 07/18/2014   Procedure: TRANSESOPHAGEAL ECHOCARDIOGRAM (TEE);  Surgeon: Melrose Nakayama, MD;  Location: [place];  Service: Open Heart Surgery;  Laterality: N/A;   Family History  Family History  Problem Relation Age of Onset  . CAD Mother   . Stroke Mother   . Diabetes Mother   . COPD Father   . Diabetes Sister   . Chronic Renal Failure Sister   . Diabetes Brother   . Colon cancer Neg Hx    Social History  reports that she has never smoked. She has never used smokeless tobacco. She reports that she does not drink alcohol or use drugs. Allergies No Known Allergies Home medications Prior to Admission medications   Medication Sig Start Date End Date Taking? Authorizing Provider  acetaminophen (TYLENOL) 325 MG tablet Take 650 mg by mouth every 4 (four) hours as needed for mild pain.   Yes [provider]  amitriptyline (ELAVIL) 10 MG tablet Take 10 mg by mouth at bedtime.  01/19/18  Yes [provider]  aspirin 81 MG chewable tablet CHEW AND SWALLOW 1 TABLET(81 MG) BY MOUTH DAILY Patient taking differently: Chew 81 mg by mouth daily.  09/08/18  Yes Chundi, Vahini, MD  calcitRIOL (ROCALTROL) 0.5 MCG capsule Take 0.5 mcg by mouth at bedtime.    Yes [provider]  calcium acetate (PHOSLO) 667 MG capsule Take 1,334-2,001 mg by mouth See admin instructions. Take 2,001 mg by mouth three times a day with meals and 1,334 mg with each snack   Yes [provider]  diphenoxylate-atropine (LOMOTIL) 2.5-0.025 MG tablet Take 1 tablet by mouth 4 (four) times daily as needed for diarrhea or loose stools.   Yes [provider]  gabapentin (NEURONTIN) 100 MG capsule Take 100 mg by mouth 3 (three) times daily.   Yes [provider]   gentamicin cream (GARAMYCIN) 0.1 % Apply 1 application topically See admin instructions. Apply topically to dialysis site daily after showering   Yes [provider]  HYDROmorphone (DILAUDID) 2 MG tablet Take 1 tablet (2 mg total) by mouth every 6 (six) hours as needed for moderate pain or severe pain. 09/22/18  Yes Lorella Nimrod, MD  insulin lispro (HUMALOG) 100 UNIT/ML injection Inject 1-10 Units into the skin See admin instructions. Inject 1-10 units into the skin three times a day before meals, per sliding scale   Yes [provider]  insulin NPH Human (HUMULIN N,NOVOLIN N) 100 UNIT/ML injection Inject 0.1 mLs (10 Units total) into the skin at bedtime. 07/24/18  Yes Asencion Noble, MD  levonorgestrel (MIRENA) 20 MCG/24HR IUD 1 each by Intrauterine route once. Implanted March 2018   Yes [provider]  lipase/protease/amylase (CREON) 36000 UNITS CPEP capsule Take 72,000-108,000 Units by mouth See admin instructions. Take 108,000 units by mouth three times a day with meals and 72,000 units with each snack   Yes [provider]  multivitamin (RENA-VIT) TABS tablet Take 1 tablet by mouth at bedtime.   Yes [provider]  mupirocin cream (BACTROBAN) 2 % Apply 1 application topically daily.   Yes [provider]  ondansetron (ZOFRAN) 4 MG tablet Take 4 mg by mouth every 8 (eight) hours as needed for nausea or vomiting.   Yes [provider]  pantoprazole (PROTONIX) 40 MG tablet TAKE 1 TABLET BY MOUTH TWICE DAILY Patient taking differently: Take 40 mg by mouth daily.  09/28/18  Yes Chundi, Vahini, MD  potassium chloride SA (K-DUR,KLOR-CON) 20 MEQ tablet Take 1 tablet (20 mEq total) by mouth daily. Patient taking differently: Take 20 mEq by mouth at bedtime.  07/03/18 10/09/27 Yes Almyra Deforest, PA  promethazine (PHENERGAN) 25 MG suppository Place 25 mg rectally every 6 (six) hours as needed for nausea or vomiting.   Yes [provider]    Liver Function Tests Recent Labs  Lab 10/05/2018 0408  AST 28  ALT 25  ALKPHOS 284*  BILITOT 1.2  PROT 6.1*  ALBUMIN 1.3*   No results for input(s): LIPASE, AMYLASE in the last 168 hours. CBC Recent Labs  Lab 10/16/2018 0408  WBC 13.7*  NEUTROABS 12.7*  HGB 9.5*  HCT 28.4*  MCV 73.6*  PLT 341   Basic Metabolic Panel Recent Labs  Lab 10/26/2018 0408 10/24/2018 0720  NA 128* 128*  K <2.0* <2.0*  CL 87* 89*  CO2 20* 19*  GLUCOSE 256* 285*  BUN 29* 29*  CREATININE 8.70* 8.71*  CALCIUM 7.6* 7.1*   Iron/TIBC/Ferritin/ %Sat    Component Value Date/Time   IRON 20 (L) 07/17/2018 1410   TIBC 161 (L) 07/17/2018 1410   FERRITIN 28 09/25/2012 0525   IRONPCTSAT 12 07/17/2018 1410    Vitals:   10/13/2018 0818 09/30/2018 0820 10/06/2018 0822 09/28/2018 0828  BP: (!) 137/115 (!) 135/121 (!) 102/46 (!) 108/52  Pulse:  (!) 31    Resp: 19 (!) 24 13 (!) 0  Temp:      TempSrc:      SpO2:  94%    Weight:        Exam Gen alert, fatigued, chronically ill , dry mouth No rash, cyanosis or gangrene Sclera anicteric  No jvd or bruits , neck veins flat Chest clear bilat to bases RRR no MRG Abd soft ntnd no mass or ascites +bs, PD cath LUQ w/o drainage or erythema GU defer MS no joint effusions or deformity Ext no LE or UE edema, no wounds or ulcers  Neuro is fatigued, nonfocal, conversant, awake    Home meds:  - aspirin 81 / pantoprazole 40 qd/ Kdur 20 qd/ creon ac tid  - gabapentin 100 tid/ amitriptyline 10 hs  - NPH insulin qhs/ humalog insulin ssi ac tid    Outpt PD orders:  6 exchanges per night; edw 54kg  , 2 L dwell, dwell time 1.5 hrs, last fill 0, daytime exhanges 1, daytime fill vol 1500 cc x 4 hrs     Assessment/ Plan: 1. Hypotension - looks vol depleted, improved w/ IVF's.   2. Nausea/ vomiting/ anorexia - w/ sig wt loss, chronic issue , looks like probably inadequate dialysis.  Will need to switch to HD for the time being. Will ask IR to see for HD cath and plan  HD tonight or at the latest tomorrow (heavy pt burden today). Will avoid further PD which will lower K+ more.  3. Hypokalemia - severe, not eating and PD pulls K+ out. Has IV and po K+ ordered, will come up some w/ HD as well.  Cont aggressive replacement until K > 3.5 4. DM type I 5. H/o CABG / MV repair/ ICM 6. H/o failed panc-renal transplant 7. Anemia ckd - Hb not bad 9.5, but will drop w/ volume repletion      Kelly Splinter  MD 10/14/2018, 9:55 AM

## 2018-10-09 NOTE — ED Notes (Signed)
Priscilla [name]- [phone] for updates

## 2018-10-09 NOTE — ED Notes (Signed)
Armandina Stammer- [phone] for updates

## 2018-10-09 NOTE — Progress Notes (Signed)
NIF: -20  RT unable to obtain ABG, another RT called to attempt.

## 2018-10-10 ENCOUNTER — Inpatient Hospital Stay (HOSPITAL_COMMUNITY): Payer: Medicare Other

## 2018-10-10 ENCOUNTER — Encounter (HOSPITAL_COMMUNITY): Payer: Self-pay | Admitting: Interventional Radiology

## 2018-10-10 DIAGNOSIS — R197 Diarrhea, unspecified: Secondary | ICD-10-CM

## 2018-10-10 DIAGNOSIS — I9589 Other hypotension: Secondary | ICD-10-CM

## 2018-10-10 DIAGNOSIS — G9340 Encephalopathy, unspecified: Secondary | ICD-10-CM

## 2018-10-10 DIAGNOSIS — R0902 Hypoxemia: Secondary | ICD-10-CM

## 2018-10-10 HISTORY — PX: IR US GUIDE VASC ACCESS LEFT: IMG2389

## 2018-10-10 HISTORY — PX: IR FLUORO GUIDE CV LINE LEFT: IMG2282

## 2018-10-10 LAB — C DIFFICILE QUICK SCREEN W PCR REFLEX
C Diff antigen: POSITIVE — AB
C Diff toxin: NEGATIVE

## 2018-10-10 LAB — BASIC METABOLIC PANEL
Anion gap: 12 (ref 5–15)
Anion gap: 14 (ref 5–15)
BUN: 28 mg/dL — ABNORMAL HIGH (ref 6–20)
BUN: 30 mg/dL — ABNORMAL HIGH (ref 6–20)
CO2: 17 mmol/L — ABNORMAL LOW (ref 22–32)
CO2: 18 mmol/L — ABNORMAL LOW (ref 22–32)
Calcium: 6.6 mg/dL — ABNORMAL LOW (ref 8.9–10.3)
Calcium: 6.8 mg/dL — ABNORMAL LOW (ref 8.9–10.3)
Chloride: 102 mmol/L (ref 98–111)
Chloride: 104 mmol/L (ref 98–111)
Creatinine, Ser: 7.74 mg/dL — ABNORMAL HIGH (ref 0.44–1.00)
Creatinine, Ser: 7.82 mg/dL — ABNORMAL HIGH (ref 0.44–1.00)
GFR calc Af Amer: 7 mL/min — ABNORMAL LOW (ref >60)
GFR calc Af Amer: 7 mL/min — ABNORMAL LOW (ref >60)
GFR calc non Af Amer: 6 mL/min — ABNORMAL LOW (ref >60)
GFR calc non Af Amer: 6 mL/min — ABNORMAL LOW (ref >60)
Glucose, Bld: 83 mg/dL (ref 70–99)
Glucose, Bld: 85 mg/dL (ref 70–99)
Potassium: 3.1 mmol/L — ABNORMAL LOW (ref 3.5–5.1)
Potassium: 3.5 mmol/L (ref 3.5–5.1)
Sodium: 133 mmol/L — ABNORMAL LOW (ref 135–145)
Sodium: 134 mmol/L — ABNORMAL LOW (ref 135–145)

## 2018-10-10 LAB — GLUCOSE, CAPILLARY
Glucose-Capillary: 91 mg/dL (ref 70–99)
Glucose-Capillary: 41 mg/dL — CL (ref 70–99)
Glucose-Capillary: 126 mg/dL — ABNORMAL HIGH (ref 70–99)
Glucose-Capillary: 115 mg/dL — ABNORMAL HIGH (ref 70–99)
Glucose-Capillary: 87 mg/dL (ref 70–99)
Glucose-Capillary: 119 mg/dL — ABNORMAL HIGH (ref 70–99)
Glucose-Capillary: 78 mg/dL (ref 70–99)
Glucose-Capillary: 162 mg/dL — ABNORMAL HIGH (ref 70–99)

## 2018-10-10 LAB — CBC
HCT: 24.5 % — ABNORMAL LOW (ref 36.0–46.0)
Hemoglobin: 8.1 g/dL — ABNORMAL LOW (ref 12.0–15.0)
MCH: 24.1 pg — ABNORMAL LOW (ref 26.0–34.0)
MCHC: 33.1 g/dL (ref 30.0–36.0)
MCV: 72.9 fL — ABNORMAL LOW (ref 80.0–100.0)
Platelets: 300 10*3/uL (ref 150–400)
RBC: 3.36 MIL/uL — ABNORMAL LOW (ref 3.87–5.11)
RDW: 18.8 % — ABNORMAL HIGH (ref 11.5–15.5)
WBC: 14.6 10*3/uL — ABNORMAL HIGH (ref 4.0–10.5)
nRBC: 0 % (ref 0.0–0.2)

## 2018-10-10 LAB — CLOSTRIDIUM DIFFICILE BY PCR, REFLEXED: Toxigenic C. Difficile by PCR: POSITIVE — AB

## 2018-10-10 MED ORDER — LIDOCAINE HCL (PF) 1 % IJ SOLN
INTRAMUSCULAR | Status: AC | PRN
  Administered 2018-10-10: 5 mL

## 2018-10-10 MED ORDER — SODIUM CHLORIDE 0.9 % IV SOLN
INTRAVENOUS | Status: DC | PRN
  Administered 2018-10-10 (×2): 1000 mL via INTRAVENOUS

## 2018-10-10 MED ORDER — DEXTROSE 50 % IV SOLN
INTRAVENOUS | Status: AC
  Administered 2018-10-10: 50 mL
  Filled 2018-10-10: qty 50

## 2018-10-10 MED ORDER — HEPARIN SODIUM (PORCINE) 1000 UNIT/ML DIALYSIS: 2000.0000 [IU] | Freq: Once | INTRAMUSCULAR | Status: DC

## 2018-10-10 MED ORDER — HEPARIN SODIUM (PORCINE) 1000 UNIT/ML IJ SOLN
INTRAMUSCULAR | Status: AC
  Filled 2018-10-10: qty 4

## 2018-10-10 MED ORDER — SODIUM CHLORIDE 0.9 % IV SOLN
INTRAVENOUS | Status: DC
  Administered 2018-10-10 (×2): via INTRAVENOUS

## 2018-10-10 MED ORDER — HEPARIN SODIUM (PORCINE) 1000 UNIT/ML IJ SOLN: 1000.0000 [IU] | INTRAMUSCULAR | Status: DC | PRN

## 2018-10-10 MED ORDER — LIDOCAINE HCL 1 % IJ SOLN
INTRAMUSCULAR | Status: AC
  Filled 2018-10-10: qty 20

## 2018-10-10 MED ORDER — WHITE PETROLATUM EX OINT
TOPICAL_OINTMENT | CUTANEOUS | Status: AC
  Administered 2018-10-10: 19:00:00
  Filled 2018-10-10: qty 28.35

## 2018-10-10 MED ORDER — MAGNESIUM SULFATE 4 GM/100ML IV SOLN
4.0000 g | Freq: Once | INTRAVENOUS | Status: AC
  Administered 2018-10-10: 4 g via INTRAVENOUS
  Filled 2018-10-10: qty 100

## 2018-10-10 MED ORDER — HEPARIN SODIUM (PORCINE) 1000 UNIT/ML IJ SOLN
INTRAMUSCULAR | Status: AC
  Filled 2018-10-10: qty 1

## 2018-10-10 MED ORDER — HEPARIN SODIUM (PORCINE) 5000 UNIT/ML IJ SOLN
5000.0000 [IU] | Freq: Three times a day (TID) | INTRAMUSCULAR | Status: DC
  Administered 2018-10-10 (×2): 5000 [IU] via SUBCUTANEOUS
  Filled 2018-10-10 (×2): qty 1

## 2018-10-10 MED ORDER — TRAMADOL HCL 50 MG PO TABS
25.0000 mg | ORAL_TABLET | Freq: Once | ORAL | Status: AC
  Administered 2018-10-10: 12:00:00 25 mg via ORAL
  Filled 2018-10-10: qty 1

## 2018-10-10 NOTE — Progress Notes (Signed)
Hypoglycemic Event  CBG: 41  Treatment: D50 50 mL (25 gm)  Symptoms: Shaky  Follow-up CBG: [id number] CBG Result: 126  Possible Reasons for Event: Inadequate meal intake      Mardi Mainland

## 2018-10-10 NOTE — Progress Notes (Signed)
[facility] Progress Note  Subjective: went to ICU overnight for hypotension, got IVf's, + diarrhea. Had L IJ temp cath placed by IR this am  Vitals:   10/10/18 0800 10/10/18 0900 10/10/18 0930 10/10/18 1149  BP: 91/63 (!) 89/57 (!) 98/54   Pulse: 73  83   Resp: 14 19 (!) 22   Temp:    (!) 97.2 F (36.2 C)  TempSrc:    Oral  SpO2: 100% 100% 99%   Weight:        Inpatient medications: . aspirin  81 mg Oral Daily  . calcitRIOL  0.5 mcg Oral QHS  . calcium acetate  1,334 mg Oral With snacks  . calcium acetate  2,001 mg Oral TID WC  . Chlorhexidine Gluconate Cloth  6 each Topical Q0600  . Chlorhexidine Gluconate Cloth  6 each Topical Q0600  . gabapentin  100 mg Oral TID  . heparin      . heparin injection (subcutaneous)  5,000 Units Subcutaneous Q8H  . insulin aspart  0-5 Units Subcutaneous QHS  . insulin aspart  0-9 Units Subcutaneous TID WC  . insulin NPH Human  5 Units Subcutaneous QHS  . lidocaine      . lipase/protease/amylase  108,000 Units Oral TID WC  . lipase/protease/amylase  72,000 Units Oral With snacks  . multivitamin  1 tablet Oral QHS  . pantoprazole  40 mg Oral Daily   . sodium chloride 10 mL/hr at 10/10/18 0843  . sodium chloride    . 0.9 % NaCl with KCl 20 mEq / L 200 mL/hr at 10/10/18 1016  . azithromycin    . cefTRIAXone (ROCEPHIN)  IV Stopped (10/10/18 0843)  . metronidazole 500 mg (10/10/18 1035)   sodium chloride, acetaminophen **OR** acetaminophen, ondansetron **OR** ondansetron (ZOFRAN) IV    Exam: Gen alert, fatigued, chron ill appearing No jvd or bruits , neck veins flat Chest clear bilat to bases RRR no MRG Abd soft ntnd no mass or ascites +bs, PD cath LUQ Ext no LE edema Neuro is fatigued, pleasant, nonfocal    Home meds:  - aspirin 81 / pantoprazole 40 qd/ Kdur 20 qd/ creon ac tid  - gabapentin 100 tid/ amitriptyline 10 hs  - NPH insulin qhs/ humalog insulin ssi ac tid    Outpt PD orders:  6 exchanges per  night; edw 54kg  , 2 L dwell, dwell time 1.5 hrs, last fill 0, daytime exhanges 1, daytime fill vol 1500 cc x 4 hrs   Assessment/ Plan: 1. Hypotension - BP's low 90's today   2. Nausea/ vomiting/ anorexia - w/ sig wt loss, failing PD at this time. Plan transition to HD, sp temp cath by IR this am. Plan HD today then again Monday.  3. Hypokalemia - severe, < 2.0 on admission, almost corrected 4. DM type I 5. H/o CABG / MV repair/ ICM 6. H/o failed panc-renal transplant 7. Anemia ckd - Hb not bad 9.5, but will drop w/ volume repletion    [facility] 10/10/2018, 11:56 AM  Iron/TIBC/Ferritin/ %Sat    Component Value Date/Time   IRON 20 (L) 07/17/2018 1410   TIBC 161 (L) 07/17/2018 1410   FERRITIN 28 09/25/2012 0525   IRONPCTSAT 12 07/17/2018 1410   Recent Labs  Lab 10/21/2018 0408  10/18/2018 1138  10/10/18 0252  NA 128*   < > 128*   < > 133*  K <2.0*   < > 2.5*   < > 3.5  CL 87*   < > 92*   < > 104  CO2 20*   < > 19*   < > 17*  GLUCOSE 256*   < > 308*   < > 85  BUN 29*   < > 29*   < > 28*  CREATININE 8.70*   < > 8.28*   < > 7.74*  CALCIUM 7.6*   < > 6.7*   < > 6.6*  ALBUMIN 1.3*  --   --   --   --   INR  --   --  1.7*  --   --    < > = values in this interval not displayed.   Recent Labs  Lab 10/10/2018 0408  AST 28  ALT 25  ALKPHOS 284*  BILITOT 1.2  PROT 6.1*   Recent Labs  Lab 10/15/2018 0408  WBC 13.7*  HGB 9.5*  HCT 28.4*  PLT 345

## 2018-10-10 NOTE — Consult Note (Addendum)
NAMEKeileigh [name], MRN:  [id number], DOB:  Aug 29, 1970, LOS: 1 ADMISSION DATE:  10/08/2018, CONSULTATION DATE:  10/16/2018 REFERRING MD:  Graciella Freer, CHIEF COMPLAINT:  Hypotension and concern for respiratory failure   Brief History   48 y/o F with PMH of ESRD on PD who has been having increased N/V/D for the past 4 days. She reports she has had diarrhea for the better part of a year.  Patient had dialysis on Wednesday and symptoms developed Thursday after massive amounts of diarrhea and vomiting that the patient is simply unable to keep up intake. K was <2.  IMTS performed a NIF that was <20 and requested PCCM to come in intubate and evaluate for pressors.  The patient was transferred to ICU for care.   Past Medical History  ESRD-PD Chronic diarrhea   Significant Hospital Events   6/12 Admit with hypovolemic hypotension  Consults:  Renal PCCM  Procedures:  L IJ HD (IR) 6/13 >>   Significant Diagnostic Tests:     Micro Data:  MRSA 6/12 >>Negative COVID 6/12 >>Negative Blood 6/12 >> C-Diff 6/13 >>   Antimicrobials:  Rocephin 6/12 (L base ASD) >> Zithromax 6/12 (L base ASD) >>   Interim history/subjective:  Pt reports she has had diarrhea at least since 04/2018 if not before then.  She reports she has been laying at home more in the bed and incontinent.  Wounds on bottom and legs started approximately one month ago.   Objective   Blood pressure (!) 98/54, pulse 83, temperature (!) 97.3 F (36.3 C), temperature source Oral, resp. rate (!) 22, weight 52.1 kg, SpO2 99 %.        Intake/Output Summary (Last 24 hours) at 10/10/2018 1044 Last data filed at 10/10/2018 0900 Gross per 24 hour  Intake 4774.97 ml  Output -  Net 4774.97 ml   Filed Weights   10/12/2018 0329  Weight: 52.1 kg    Examination: General: chronically ill appearing adult female lying in bed in NAD HEENT: MM pink/dry, no jvd, L IJ perm cath Neuro: AAOx4, speech clear, MAE CV: s1s2 rrr, no m/r/g  PULM: even/non-labored, lungs bilaterally clear GI: soft, non-tender, bsx4 active  Extremities: warm/dry, no edema  Skin: multiple irregular, some circular, varied in size lesions on her left buttock, sacrum, backs of upper legs.  Some with dark cap on lesions.    Resolved Hospital Problem list      Assessment & Plan:     Hypovolemic Hypotension -in setting of chronic diarrhea, likely related to PD related chronic hypomagnesemia P: Volume resuscitation  KCL in IVF, hold if K > 3.5 to stop at 1400, then NS at 86ml/hr  Attempt to match intake with output  Follow serial BMP Q6 x4   Diarrhea  -acute on chronic, suspect secondary to PD related hypomagnesemia  P: Volume replacement as above  Assess for GI pathogens Enteric precautions for now   CKD  -on PD prior to admit P: Trend BMP / urinary output Avoid nephrotoxic agents, ensure adequate renal perfusion Renal following, appreciate input > rec's to begin HD. Perm cath placed per IR.   Hypomagnesemia  Hypokalemia  P: IVF as above  Aggressive replacement of lytes given volume loss   N/V P: PRN zofran   Anemia  -suspect secondary to renal disease P: Trend CBC  Moisture Related Skin Breakdown -do not feel this is shingles / herpes given clinical history of diarrhea, lying in bed and skin breakdown.  Wounds also cross the  midline Pain secondary to wounds P: Continue wound care  Keep skin clean and dry  Does not warrant treatment for shingles or airborne precautions  Ultram 25 mg PO x 1 > renal    Labs   CBC: Recent Labs  Lab 10/01/2018 0408  WBC 13.7*  NEUTROABS 12.7*  HGB 9.5*  HCT 28.4*  MCV 73.6*  PLT 101    Basic Metabolic Panel: Recent Labs  Lab 10/19/2018 0720  10/05/2018 1748 10/07/2018 1857 10/16/2018 2010 10/26/2018 2345 10/10/18 0252  NA 128*   < > 131* 132* 132* 134* 133*  K <2.0*   < > 2.5* 2.5* 2.6* 3.1* 3.5  CL 89*   < > 98 99 99 102 104  CO2 19*   < > 18* 20* 21* 18* 17*  GLUCOSE 285*    < > 199* 182* 157* 83 85  BUN 29*   < > 30* 29* 30* 30* 28*  CREATININE 8.71*   < > 8.29* 7.91* 8.21* 7.82* 7.74*  CALCIUM 7.1*   < > 6.9* 6.9* 6.7* 6.8* 6.6*  MG 1.2*  --   --   --   --   --   --    < > = values in this interval not displayed.   GFR: Estimated Creatinine Clearance: 6.5 mL/min (A) (by C-G formula based on SCr of 7.74 mg/dL (H)). Recent Labs  Lab 10/27/2018 0408 10/13/2018 0612 10/26/2018 1546  WBC 13.7*  --   --   LATICACIDVEN 5.0* 4.3* 3.0*    Liver Function Tests: Recent Labs  Lab 10/21/2018 0408  AST 28  ALT 25  ALKPHOS 284*  BILITOT 1.2  PROT 6.1*  ALBUMIN 1.3*   No results for input(s): LIPASE, AMYLASE in the last 168 hours. No results for input(s): AMMONIA in the last 168 hours.  ABG    Component Value Date/Time   PHART 7.269 (L) 07/19/2014 0247   PCO2ART 41.0 07/19/2014 0247   PO2ART 83.0 07/19/2014 0247   HCO3 24.1 10/25/2018 1515   TCO2 29 07/31/2018 1428   ACIDBASEDEF 1.3 09/28/2018 1515   O2SAT 34.6 10/19/2018 1515     Coagulation Profile: Recent Labs  Lab 10/04/2018 1138  INR 1.7*    Cardiac Enzymes: Recent Labs  Lab 10/22/2018 0720 10/04/2018 1545 10/26/2018 1748  TROPONINI 0.20* 0.21* 0.19*    HbA1C: Hgb A1c MFr Bld  Date/Time Value Ref Range Status  06/30/2018 03:20 PM 11.0 (H) 4.8 - 5.6 % Final    Comment:    (NOTE) Pre diabetes:          5.7%-6.4% Diabetes:              >6.4% Glycemic control for   <7.0% adults with diabetes   05/14/2016 07:07 PM 15.0 (H) 4.8 - 5.6 % Final    Comment:    (NOTE)         Pre-diabetes: 5.7 - 6.4         Diabetes: >6.4         Glycemic control for adults with diabetes: <7.0     CBG: Recent Labs  Lab 10/24/2018 2337 10/10/18 0007 10/10/18 0347 10/10/18 0353 10/10/18 0441  GLUCAP 60* 91 4* 41* 126*    Priscilla Gens, NP-C [facility] Pgr: [phone] or if no answer [phone] 10/10/2018, 10:44 AM  Attending Note:  48 year old female with PMH of ESRD on PD who  presents with chronic diarrhea and hypovolemic shock that was transferred to the  ICU for medical management.  Improving diarrhea overnight with no further events.  On exam, she is alert and interactive, highly responsive to IVF.  HD catheter in place.  HD to be started today.  I reviewed CXR myself, no acute disease noted.  Discussed with PCCM-NP.  Will hold in the ICU for HD to see if tolerated x1 day.  PCCM will continue to follow.  BMET in AM.  Replace electrolytes as tolerated.    The patient is critically ill with multiple organ systems failure and requires high complexity decision making for assessment and support, frequent evaluation and titration of therapies, application of advanced monitoring technologies and extensive interpretation of multiple databases.   Critical Care Time devoted to patient care services described in this note is  32  Minutes. This time reflects time of care of this signee Dr Jennet Maduro. This critical care time does not reflect procedure time, or teaching time or supervisory time of PA/NP/Med student/Med Resident etc but could involve care discussion time.  Rush Farmer, M.D. [facility] Pulmonary/Critical Care Medicine. Pager: [phone]. After hours pager: [phone].

## 2018-10-10 NOTE — Sedation Documentation (Signed)
Pt transported back to room in bed with monitor by RN and Darnelle Maffucci.

## 2018-10-10 NOTE — Sedation Documentation (Signed)
Catheter placed. Pt tolerated well.

## 2018-10-10 NOTE — Procedures (Signed)
ESRD  S/P LT IJ TEMP HD CATH  Tip prox RA No comp Stable ebl min Ready for use Full report in pacs

## 2018-10-11 ENCOUNTER — Inpatient Hospital Stay (HOSPITAL_COMMUNITY): Payer: Medicare Other

## 2018-10-11 DIAGNOSIS — I469 Cardiac arrest, cause unspecified: Secondary | ICD-10-CM

## 2018-10-11 LAB — CBC
HCT: 24.7 % — ABNORMAL LOW (ref 36.0–46.0)
Hemoglobin: 8 g/dL — ABNORMAL LOW (ref 12.0–15.0)
MCH: 24.2 pg — ABNORMAL LOW (ref 26.0–34.0)
MCHC: 32.4 g/dL (ref 30.0–36.0)
MCV: 74.6 fL — ABNORMAL LOW (ref 80.0–100.0)
Platelets: 354 10*3/uL (ref 150–400)
RBC: 3.31 MIL/uL — ABNORMAL LOW (ref 3.87–5.11)
RDW: 19.3 % — ABNORMAL HIGH (ref 11.5–15.5)
WBC: 16 10*3/uL — ABNORMAL HIGH (ref 4.0–10.5)
nRBC: 0.2 % (ref 0.0–0.2)

## 2018-10-11 LAB — BASIC METABOLIC PANEL
Anion gap: 16 — ABNORMAL HIGH (ref 5–15)
BUN: 12 mg/dL (ref 6–20)
CO2: 13 mmol/L — ABNORMAL LOW (ref 22–32)
Calcium: 6.7 mg/dL — ABNORMAL LOW (ref 8.9–10.3)
Chloride: 107 mmol/L (ref 98–111)
Creatinine, Ser: 4.6 mg/dL — ABNORMAL HIGH (ref 0.44–1.00)
GFR calc Af Amer: 12 mL/min — ABNORMAL LOW (ref >60)
GFR calc non Af Amer: 11 mL/min — ABNORMAL LOW (ref >60)
Glucose, Bld: 155 mg/dL — ABNORMAL HIGH (ref 70–99)
Potassium: 4 mmol/L (ref 3.5–5.1)
Sodium: 136 mmol/L (ref 135–145)

## 2018-10-11 LAB — GLUCOSE, CAPILLARY: Glucose-Capillary: 66 mg/dL — ABNORMAL LOW (ref 70–99)

## 2018-10-11 LAB — GASTROINTESTINAL PANEL BY PCR, STOOL (REPLACES STOOL CULTURE)

## 2018-10-11 MED ORDER — TRAMADOL HCL 50 MG PO TABS: 50.0000 mg | ORAL_TABLET | Freq: Two times a day (BID) | ORAL | Status: DC | PRN

## 2018-10-11 MED ORDER — ALUM & MAG HYDROXIDE-SIMETH 200-200-20 MG/5ML PO SUSP
30.0000 mL | Freq: Once | ORAL | Status: AC
  Administered 2018-10-11: 30 mL via ORAL
  Filled 2018-10-11: qty 30

## 2018-10-11 MED ORDER — DEXTROSE 50 % IV SOLN
INTRAVENOUS | Status: AC
  Administered 2018-10-11: 04:00:00
  Filled 2018-10-11: qty 50

## 2018-10-11 MED ORDER — VANCOMYCIN 50 MG/ML ORAL SOLUTION
125.0000 mg | Freq: Four times a day (QID) | ORAL | Status: DC
  Administered 2018-10-11: 125 mg via ORAL
  Filled 2018-10-11 (×3): qty 2.5

## 2018-10-11 MED ORDER — ATROPINE SULFATE 1 MG/10ML IJ SOSY
PREFILLED_SYRINGE | INTRAMUSCULAR | Status: AC
  Filled 2018-10-11: qty 10

## 2018-10-11 MED ORDER — MIDAZOLAM HCL 2 MG/2ML IJ SOLN: 1.0000 mg | Freq: Once | INTRAMUSCULAR | Status: DC

## 2018-10-11 MED ORDER — HYOSCYAMINE SULFATE 0.125 MG SL SUBL
0.2500 mg | SUBLINGUAL_TABLET | Freq: Once | SUBLINGUAL | Status: AC
  Administered 2018-10-11: 0.25 mg via SUBLINGUAL
  Filled 2018-10-11: qty 2

## 2018-10-11 MED ORDER — SODIUM CHLORIDE 0.9 % IV BOLUS
500.0000 mL | Freq: Once | INTRAVENOUS | Status: AC
  Administered 2018-10-11: 500 mL via INTRAVENOUS

## 2018-10-11 MED ORDER — SODIUM CHLORIDE 0.9 % IV BOLUS
250.0000 mL | Freq: Once | INTRAVENOUS | Status: AC
  Administered 2018-10-11: 03:00:00 via INTRAVENOUS

## 2018-10-11 MED ORDER — LIDOCAINE VISCOUS HCL 2 % MT SOLN
15.0000 mL | Freq: Once | OROMUCOSAL | Status: AC
  Administered 2018-10-11: 15 mL via ORAL
  Filled 2018-10-11: qty 15

## 2018-10-12 LAB — BASIC METABOLIC PANEL WITH GFR
Anion gap: 11 (ref 5–15)
BUN: 13 mg/dL (ref 6–20)
CO2: 19 mmol/L — ABNORMAL LOW (ref 22–32)
Calcium: 6.7 mg/dL — ABNORMAL LOW (ref 8.9–10.3)
Chloride: 107 mmol/L (ref 98–111)
Creatinine, Ser: 4.37 mg/dL — ABNORMAL HIGH (ref 0.44–1.00)
GFR calc Af Amer: 13 mL/min — ABNORMAL LOW
GFR calc non Af Amer: 11 mL/min — ABNORMAL LOW
Glucose, Bld: 164 mg/dL — ABNORMAL HIGH (ref 70–99)
Potassium: 3.9 mmol/L (ref 3.5–5.1)
Sodium: 137 mmol/L (ref 135–145)

## 2018-10-12 LAB — GLUCOSE, CAPILLARY: Glucose-Capillary: 37 mg/dL — CL (ref 70–99)

## 2018-10-12 MED FILL — Medication: Qty: 1 | Status: AC

## 2018-10-14 LAB — CULTURE, BLOOD (ROUTINE X 2)
Culture: NO GROWTH
Culture: NO GROWTH
Special Requests: ADEQUATE
Special Requests: ADEQUATE

## 2018-10-15 ENCOUNTER — Telehealth: Payer: Self-pay | Admitting: *Deleted

## 2018-10-15 NOTE — Telephone Encounter (Signed)
Received original D/C from [employer].-D/C forwarded to [facility] for signature, for Dr.Yacoub.

## 2018-10-16 NOTE — Telephone Encounter (Signed)
Received signed original D/C , [facility] notified for pick up.

## 2018-10-20 ENCOUNTER — Encounter: Payer: Medicare Other | Admitting: Internal Medicine

## 2018-10-28 NOTE — Consult Note (Signed)
Responded to page for [facility], but no family was present. Staff paged again later that pt had unexpectedly passed, family on way. Standing by.  Rev. Eloise Levels Chaplain

## 2018-10-28 NOTE — Progress Notes (Signed)
Hypoglycemic Event  CBG: 66  Treatment: D50 25 mL (12.5 gm)  Symptoms: Shaky  Follow-up CBG: Time: CBG Result: 0421  Possible Reasons for Event: Inadequate meal intake   Pt went into PEA arrest; see code note & code sheet  Mardi Mainland

## 2018-10-28 NOTE — Progress Notes (Signed)
RN noted bradycardia on the monitor. Pt found to be unresponsive, agonal breathing, no pulse. PEA on monitor. CPR started and code called. See code sheet.

## 2018-10-28 NOTE — Progress Notes (Signed)
Pharmacy Antibiotic Note  Priscilla [name] is a 48 y.o. female admitted on 10/24/2018 with c diff.  Pharmacy has been consulted for vancomycin dosing.  Plan: Vancomycin 125mg  po qid for 10  Days F/u resolution of diarrhea  Weight: 126 lb 12.2 oz (57.5 kg)  Temp (24hrs), Avg:97.3 F (36.3 C), Min:97.2 F (36.2 C), Max:97.3 F (36.3 C)  Recent Labs  Lab 10/18/2018 0408 10/03/2018 0612  10/07/2018 1546  10/17/2018 1857 10/06/2018 2010 10/15/2018 2345 10/10/18 0252 10/10/18 1225 10/10/18 2332  WBC 13.7*  --   --   --   --   --   --   --   --  14.6*  --   CREATININE 8.70*  --    < >  --    < > 7.91* 8.21* 7.82* 7.74*  --  4.37*  LATICACIDVEN 5.0* 4.3*  --  3.0*  --   --   --   --   --   --   --    < > = values in this interval not displayed.    Estimated Creatinine Clearance: 12.8 mL/min (A) (by C-G formula based on SCr of 4.37 mg/dL (H)).    No Known Allergies   Thank you for allowing pharmacy to be a part of this patient's care.  Excell Seltzer Poteet 12-Oct-2018 1:16 AM

## 2018-10-28 NOTE — Consult Note (Signed)
Stayed with 4 grieving family members, sisters and nephew, providing emotional/spiritual support and prayer. Doctor's presence and conversation too was great comfort to them. Escorted family to ED to leave Family has pt's belongings.  Rev. Eloise Levels Chaplain

## 2018-10-28 NOTE — Code Documentation (Addendum)
Called to bedside to evaluate patient due to cardiac arrest.  Patient developed bradycardia, became unresponsive and pulseless.  ACLS initiated.  Received epi 1mg   X 9 1gm of calcium x 2 2 amps of bicarb 2 amps of D50 2gms of magnesium  Defibrillated 2 times for fine v-fib.   Patient with sustained PEA.   POCUS quickly done during pulse check showed no pericardial effusion. Left pleural effusion noted. Intubated by anesthesia.  CPR continued for 30 minutes. ROSC not achieved after 30 minutes.   Called patient's family and informed them. They are on the way to the hospital  Time of death: 0449hrs  Oswald Hillock, MD PCCM

## 2018-10-28 NOTE — Death Summary Note (Signed)
DEATH SUMMARY   Patient Details  Name: Priscilla [name] MRN: [id number] DOB: August 04, 1970  Admission/Discharge Information   Admit Date:  October 24, 2018  Date of Death: Date of Death: 10/26/2018  Time of Death: Time of Death: 0449  Length of Stay: 2  Referring Physician: Lars Mage, MD   Reason(s) for Hospitalization  Hypotension and diarrhea  Diagnoses  Preliminary cause of death:   Pulseless electrical activity cardiac arrest Secondary Diagnoses (including complications and co-morbidities):  Active Problems:   Symptomatic hypotension Pulseless electrical activity cardiac arrest C diff colitis  Brief Hospital Course (including significant findings, care, treatment, and services provided and events leading to death)  48 y/o F with PMH of ESRD on PD who has been having increased N/V/D for the past 4 days. She reports she has had diarrhea for the better part of a year.  Patient had dialysis on Wednesday and symptoms developed Thursday after massive amounts of diarrhea and vomiting that the patient is simply unable to keep up intake. K was <2.  IMTS performed a NIF that was <20 and requested PCCM to come in intubate and evaluate for pressors.  The patient was transferred to ICU for care.   Patient was diagnosed with C diff.  Overnight, she suffered a cardiac arrest.  Code blue was called but patient did not survive and was declared dead and family notified.    Pertinent Labs and Studies  Significant Diagnostic Studies Ir Fluoro Guide Cv Line Left  Result Date: 10/10/2018 INDICATION: End-stage renal disease EXAM: ULTRASOUND GUIDANCE FOR VASCULAR ACCESS LEFT IJ TEMPORARY DIALYSIS CATHETER MEDICATIONS: 1% LIDOCAINE LOCAL ANESTHESIA/SEDATION: Moderate Sedation Time: NONE. Minutes. The patient's level of consciousness and vital signs were monitored continuously by radiology nursing throughout the procedure under my direct supervision. FLUOROSCOPY TIME:  Fluoroscopy Time: 0 minutes 6  seconds (less than 1 mGy). COMPLICATIONS: None immediate. PROCEDURE: Informed written consent was obtained from the patient after a thorough discussion of the procedural risks, benefits and alternatives. All questions were addressed. Maximal Sterile Barrier Technique was utilized including caps, mask, sterile gowns, sterile gloves, sterile drape, hand hygiene and skin antiseptic. A timeout was performed prior to the initiation of the procedure. Under sterile conditions and local anesthesia, ultrasound micropuncture access performed of the patent left internal jugular vein. Images obtained for documentation of the patent left jugular vein. Guidewire inserted followed by Accustick dilator set. Amplatz guidewire advanced into the IVC. Tract dilatation performed to insert a 20 cm temporary dialysis catheter. The tip was initially position at the SVC RA junction however blood would not aspirate well from the catheter. Therefore, the tip was advanced and positioned in the proximal right atrium. Catheter secured with Ethilon sutures and a sterile dressing. Blood aspirated easily followed by saline and heparin flushes. Appropriate volume strength of heparin instilled in all lumens followed by external caps. Patient tolerated the procedure well. No immediate complication. IMPRESSION: Successful ultrasound and fluoroscopic left IJ temporary dialysis catheter. Ready for use. Electronically Signed   By: Jerilynn Mages.  Shick M.D.   On: 10/10/2018 10:33   Ir US Guide Vasc Access Left  Result Date: 10/10/2018 INDICATION: End-stage renal disease EXAM: ULTRASOUND GUIDANCE FOR VASCULAR ACCESS LEFT IJ TEMPORARY DIALYSIS CATHETER MEDICATIONS: 1% LIDOCAINE LOCAL ANESTHESIA/SEDATION: Moderate Sedation Time: NONE. Minutes. The patient's level of consciousness and vital signs were monitored continuously by radiology nursing throughout the procedure under my direct supervision. FLUOROSCOPY TIME:  Fluoroscopy Time: 0 minutes 6 seconds (less than 1  mGy). COMPLICATIONS: None immediate. PROCEDURE: Informed written  consent was obtained from the patient after a thorough discussion of the procedural risks, benefits and alternatives. All questions were addressed. Maximal Sterile Barrier Technique was utilized including caps, mask, sterile gowns, sterile gloves, sterile drape, hand hygiene and skin antiseptic. A timeout was performed prior to the initiation of the procedure. Under sterile conditions and local anesthesia, ultrasound micropuncture access performed of the patent left internal jugular vein. Images obtained for documentation of the patent left jugular vein. Guidewire inserted followed by Accustick dilator set. Amplatz guidewire advanced into the IVC. Tract dilatation performed to insert a 20 cm temporary dialysis catheter. The tip was initially position at the SVC RA junction however blood would not aspirate well from the catheter. Therefore, the tip was advanced and positioned in the proximal right atrium. Catheter secured with Ethilon sutures and a sterile dressing. Blood aspirated easily followed by saline and heparin flushes. Appropriate volume strength of heparin instilled in all lumens followed by external caps. Patient tolerated the procedure well. No immediate complication. IMPRESSION: Successful ultrasound and fluoroscopic left IJ temporary dialysis catheter. Ready for use. Electronically Signed   By: Jerilynn Mages.  Shick M.D.   On: 10/10/2018 10:33   Dg Chest Port 1 View  Result Date: 09/30/2018 CLINICAL DATA:  Shortness of breath. Chronic kidney disease with peritoneal dialysis. EXAM: PORTABLE CHEST 1 VIEW COMPARISON:  One-view chest x-ray 07/31/2018 FINDINGS: The heart size is normal. Mitral annular repair is again noted. Atherosclerotic changes are noted at the aortic arch. Moderate left-sided pleural effusion is slightly decreased compared to the prior exam. Left basilar airspace opacities are present. The right lung is clear. Mild pulmonary  vascular congestion is present. IMPRESSION: 1. Moderate left-sided pleural effusion. 2. Left basilar airspace disease with air bronchograms. Findings are concerning for infection. 3. Mild pulmonary vascular congestion. Electronically Signed   By: San Morelle M.D.   On: 10/13/2018 04:29    Microbiology Recent Results (from the past 240 hour(s))  SARS Coronavirus 2     Status: None   Collection Time: 10/10/2018  5:48 AM  Result Value Ref Range Status   SARS Coronavirus 2 NOT DETECTED NOT DETECTED Final    Comment: (NOTE) SARS-CoV-2 target nucleic acids are NOT DETECTED. The SARS-CoV-2 RNA is generally detectable in upper and lower respiratory specimens during the acute phase of infection.  Negative  results do not preclude SARS-CoV-2 infection, do not rule out co-infections with other pathogens, and should not be used as the sole basis for treatment or other patient management decisions.  Negative results must be combined with clinical observations, patient history, and epidemiological information. The expected result is Not Detected. Fact Sheet for Patients: http://www.biofiredefense.com/wp-content/uploads/2020/03/BIOFIRE-COVID -19-patients.pdf Fact Sheet for Healthcare Providers: http://www.biofiredefense.com/wp-content/uploads/2020/03/BIOFIRE-COVID -19-hcp.pdf This test is not yet approved or cleared by the [place] and  has been authorized for detection and/or diagnosis of SARS-CoV-2 by FDA under an Emergency Use Authorization (EUA).  This EUA will remain in effec t (meaning this test can be used) for the duration of  the COVID-19 declaration under Section 564(b)(1) of the Act, 21 U.S.C. section 360bbb-3(b)(1), unless the authorization is terminated or revoked sooner. Performed at [facility], [place] [address] [place], [place] [address]   Culture, blood (routine x 2)     Status: None (Preliminary result)   Collection Time: 10/23/2018  6:12 AM   Specimen:  BLOOD RIGHT ARM  Result Value Ref Range Status   Specimen Description BLOOD RIGHT ARM  Final   Special Requests   Final  BOTTLES DRAWN AEROBIC AND ANAEROBIC Blood Culture adequate volume   Culture   Final    NO GROWTH 2 DAYS Performed at [facility], [place] [address], [place], [place] [address]    Report Status PENDING  Incomplete  Culture, blood (routine x 2)     Status: None (Preliminary result)   Collection Time: 10/17/2018  6:12 AM   Specimen: BLOOD LEFT HAND  Result Value Ref Range Status   Specimen Description BLOOD LEFT HAND  Final   Special Requests   Final    BOTTLES DRAWN AEROBIC ONLY Blood Culture adequate volume   Culture   Final    NO GROWTH 2 DAYS Performed at [facility], [place] [address], [place], [place] [address]    Report Status PENDING  Incomplete  MRSA PCR Screening     Status: None   Collection Time: 10/18/2018 11:49 AM   Specimen: Nasopharyngeal  Result Value Ref Range Status   MRSA by PCR NEGATIVE NEGATIVE Final    Comment:        The GeneXpert MRSA Assay (FDA approved for NASAL specimens only), is one component of a comprehensive MRSA colonization surveillance program. It is not intended to diagnose MRSA infection nor to guide or monitor treatment for MRSA infections. Performed at [facility], [place] [address], [place], [place] [address]   C difficile quick scan w PCR reflex     Status: Abnormal   Collection Time: 10/10/18 12:08 PM   Specimen: STOOL  Result Value Ref Range Status   C Diff antigen POSITIVE (A) NEGATIVE Final   C Diff toxin NEGATIVE NEGATIVE Final   C Diff interpretation Results are indeterminate. See PCR results.  Final    Comment: Performed at [facility], [place] [address], [place], [place] [address]  Gastrointestinal Panel by PCR , Stool     Status: None   Collection Time: 10/10/18 12:08 PM   Specimen: STOOL  Result Value Ref Range Status   Campylobacter species NOT DETECTED NOT DETECTED Final    Plesimonas shigelloides NOT DETECTED NOT DETECTED Final   Salmonella species NOT DETECTED NOT DETECTED Final   Yersinia enterocolitica NOT DETECTED NOT DETECTED Final   Vibrio species NOT DETECTED NOT DETECTED Final   Vibrio cholerae NOT DETECTED NOT DETECTED Final   Enteroaggregative E coli (EAEC) NOT DETECTED NOT DETECTED Final   Enteropathogenic E coli (EPEC) NOT DETECTED NOT DETECTED Final   Enterotoxigenic E coli (ETEC) NOT DETECTED NOT DETECTED Final   Shiga like toxin producing E coli (STEC) NOT DETECTED NOT DETECTED Final   Shigella/Enteroinvasive E coli (EIEC) NOT DETECTED NOT DETECTED Final   Cryptosporidium NOT DETECTED NOT DETECTED Final   Cyclospora cayetanensis NOT DETECTED NOT DETECTED Final   Entamoeba histolytica NOT DETECTED NOT DETECTED Final   Giardia lamblia NOT DETECTED NOT DETECTED Final   Adenovirus F40/41 NOT DETECTED NOT DETECTED Final   Astrovirus NOT DETECTED NOT DETECTED Final   Norovirus GI/GII NOT DETECTED NOT DETECTED Final   Rotavirus A NOT DETECTED NOT DETECTED Final   Sapovirus (I, II, IV, and V) NOT DETECTED NOT DETECTED Final    Comment: Performed at [facility], [place] [place], [place] [address]  C. Diff by PCR, Reflexed     Status: Abnormal   Collection Time: 10/10/18 12:08 PM  Result Value Ref Range Status   Toxigenic C. Difficile by PCR POSITIVE (A) NEGATIVE Final    Comment: Positive for toxigenic C. difficile  with little to no toxin production. Only treat if clinical presentation suggests symptomatic illness. Performed at [facility], [place] [address], [place], [place] [address]     Lab Basic Metabolic Panel: Recent Labs  Lab 10/25/2018 0720  10/15/2018 2010 10/27/2018 2345 10/10/18 0252 10/10/18 2332 Nov 01, 2018 0235  NA 128*   < > 132* 134* 133* 137 136  K <2.0*   < > 2.6* 3.1* 3.5 3.9 4.0  CL 89*   < > 99 102 104 107 107  CO2 19*   < > 21* 18* 17* 19* 13*  GLUCOSE 285*   < > 157* 83 85 164* 155*  BUN 29*   < >  30* 30* 28* 13 12  CREATININE 8.71*   < > 8.21* 7.82* 7.74* 4.37* 4.60*  CALCIUM 7.1*   < > 6.7* 6.8* 6.6* 6.7* 6.7*  MG 1.2*  --   --   --   --   --   --    < > = values in this interval not displayed.   Liver Function Tests: Recent Labs  Lab 10/10/2018 0408  AST 28  ALT 25  ALKPHOS 284*  BILITOT 1.2  PROT 6.1*  ALBUMIN 1.3*   No results for input(s): LIPASE, AMYLASE in the last 168 hours. No results for input(s): AMMONIA in the last 168 hours. CBC: Recent Labs  Lab 10/08/2018 0408 10/10/18 1225 11-01-18 0235  WBC 13.7* 14.6* 16.0*  NEUTROABS 12.7*  --   --   HGB 9.5* 8.1* 8.0*  HCT 28.4* 24.5* 24.7*  MCV 73.6* 72.9* 74.6*  PLT 345 300 354   Cardiac Enzymes: Recent Labs  Lab 09/30/2018 0720 10/08/2018 1545 10/10/2018 1748  TROPONINI 0.20* 0.21* 0.19*   Sepsis Labs: Recent Labs  Lab 10/12/2018 0408 10/27/2018 0612 10/21/2018 1546 10/10/18 1225 2018-11-01 0235  WBC 13.7*  --   --  14.6* 16.0*  LATICACIDVEN 5.0* 4.3* 3.0*  --   --     Procedures/Operations     , 10/12/2018, 3:54 PM

## 2018-10-28 NOTE — Progress Notes (Addendum)
RN entered patient's room to find patient yelling and complaining of abdominal pain. Pt states "help, i'm going to die". Pt tachycardic & tachypneic sating 100% on 2L [place]. Pt restless in bed. MD notified. MD ordered PO GI medications (SEE MAR). RN administered meds. Pt still complaining of pain. Pts abdomen soft, bowel sounds hypoactive (same as previous assessments). Tachycardia & tachypnea improved. Nursing will continue to monitor.

## 2018-10-28 DEATH — deceased

## 2020-02-23 IMAGING — CT CT ANGIO AOBIFEM WO/W CM
1 of 7 series · 4 of 16 positions shown, 5 images · IV contrast (agent unspecified)
Comparison: None.

CLINICAL DATA: Increasing left leg pain, initial encounter

EXAM:
CT ANGIOGRAPHY OF ABDOMINAL AORTA WITH ILIOFEMORAL RUNOFF
TECHNIQUE: Multidetector CT imaging of the abdomen, pelvis and lower
extremities was performed using the standard protocol during bolus
administration of intravenous contrast. Multiplanar CT image
reconstructions and MIPs were obtained to evaluate the vascular
anatomy.
CONTRAST:  100mL [id number]

[Series 6: arterial · axial · arterial · 0.63mm/px · z∈[+130,+898]mm · 4 of 642 slices shown, 5 images]
[im 129/642  soft-tissue]
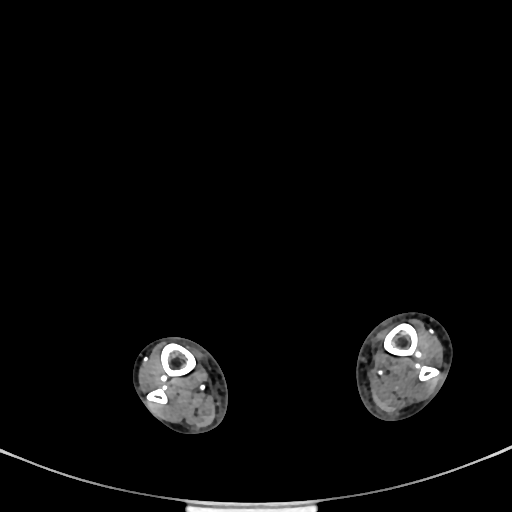
[im 129/642  bone]
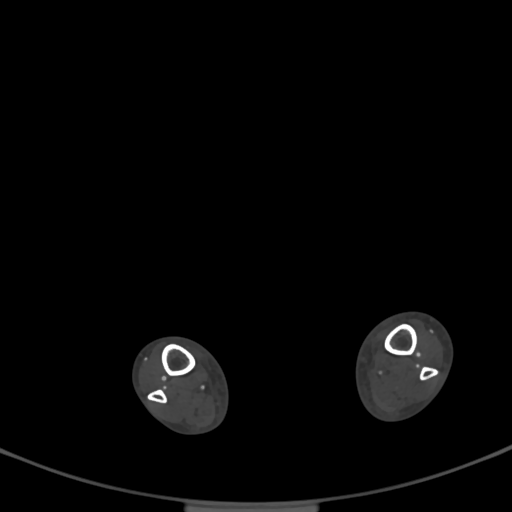
[im 257/642  soft-tissue]
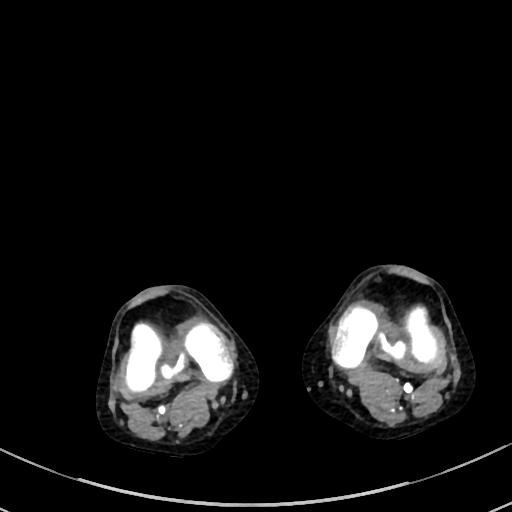
[im 385/642  soft-tissue]
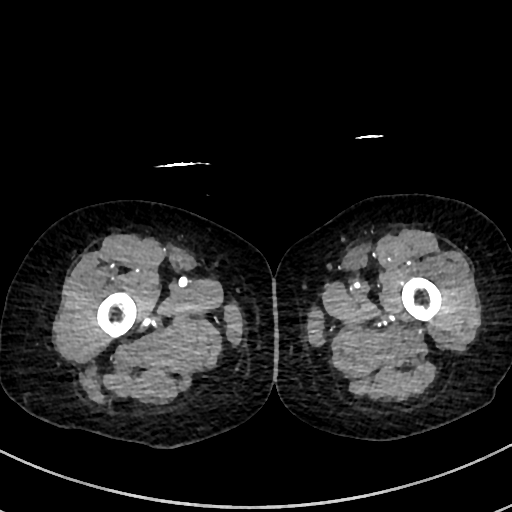
[im 513/642  soft-tissue]
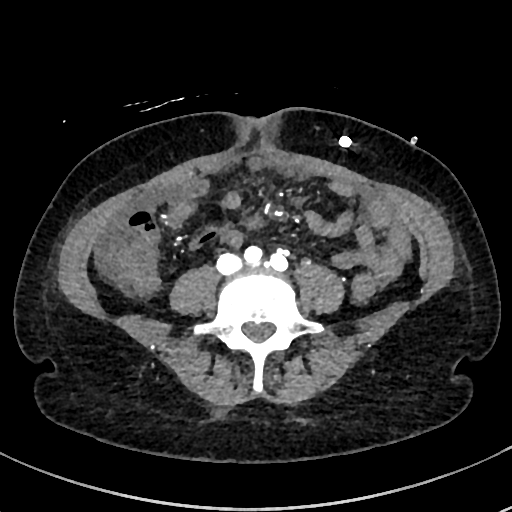

[4 of 16 positions shown; findings below may reference images not displayed]

FINDINGS: VASCULAR

Aorta: The abdominal aorta demonstrates scattered atherosclerotic
calcifications without evidence of aneurysmal dilatation or focal
stenosis.

Celiac: Mild atherosclerotic calcifications are noted although the
celiac axis is widely patent.

SMA: Mild atherosclerotic change although widely patent.

Renals: Atherosclerotic changes noted with evidence of multifocal
renal artery stenoses bilaterally consistent with the patient's
known history of end-stage renal disease.

IMA: Patent with atherosclerotic changes.

RIGHT Lower Extremity

Inflow: Diffuse arterial calcifications are identified with mild
areas of narrowing. There are 2 separate arterial anastomoses
related to a renal transplant in the right pelvis although both of
these occludes just beyond their origin. They both arise from the
external iliac artery on the right.

Runoff: The right common femoral artery is patent. The superficial
femoral artery demonstrates diffuse calcification as well as
multifocal moderate to high-grade stenoses throughout its course.
These changes extend into the popliteal artery and subsequently into
the popliteal trifurcation vessels. Again heavy calcification is
noted. The posterior tibial and peroneal arteries demonstrate
diffuse calcifications with multifocal areas of narrowing. The
dominant runoff vessel is via the anterior tibial artery extending
into the foot.

LEFT Lower Extremity

Inflow: Focal moderate to high-grade stenosis of the proximal left
common iliac artery is noted. Multifocal mild narrowing is noted
within the external iliac artery as well as the common femoral
artery.

Runoff: The superficial femoral artery demonstrates significant
calcifications similar to that seen on the right again with
multifocal areas of moderate to high-grade stenoses throughout the
superficial femoral artery. These changes extend into the popliteal
artery similar to that seen on the right side. Popliteal
trifurcation is patent although short segment occlusion of the
posterior tibial artery is noted just beyond its origin. Heavy
calcifications are identified throughout the infrapopliteal vessels
with single vessel runoff into the left foot via the anterior tibial
artery. No significant runoff via the posterior tibial artery is
noted. The peroneal artery demonstrates multifocal stenoses but
appears patent to the level of the ankle.

Veins: No definitive venous abnormality is noted.

Review of the MIP images confirms the above findings.

NON-VASCULAR

Lower chest: Mild lingular and left lower lobe infiltrate is seen
with associated small effusion. The right lung base is clear.

Hepatobiliary: No focal liver abnormality is seen. No gallstones,
gallbladder wall thickening, or biliary dilatation.

Pancreas: Unremarkable. No pancreatic ductal dilatation or
surrounding inflammatory changes.

Spleen: Normal in size without focal abnormality.

Adrenals/Urinary Tract: Kidneys are small and shrunken consistent
with the known history of end-stage renal disease. Changes of prior
failed renal transplant in the right pelvis are noted.

Stomach/Bowel: No obstructive or inflammatory changes are
identified. The appendix is not well appreciated on this exam.
Diffuse visceral vessel calcifications are identified.

Lymphatic: No significant lymphadenopathy is noted.

Reproductive: Diffuse calcification of the uterine vessels is noted.
The uterus is otherwise within normal limits. An IUD is noted in
place.

Other: Peritoneal dialysis catheter is noted within the pelvis. A
small amount of associated free fluid is noted consistent with
peritoneal dialysate. Few small fat containing anterior abdominal
wall hernias are noted in the midline superiorly. These may be
related to prior abdominal surgeries.

Musculoskeletal: No acute bony abnormality is noted.
IMPRESSION: VASCULAR

Diffuse arterial calcifications within the arterial tree throughout
the abdomen pelvis and bilateral lower extremities most consistent
with the known history of end-stage renal disease.

Multifocal areas of moderate to high-grade stenosis within the
superficial femoral arteries bilaterally. Single-vessel runoff via
the anterior tibial arteries is noted bilaterally.

NON-VASCULAR

Changes of prior right pelvic renal transplant which have failed
with significant calcification of the transplant kidney and
occlusion of the arterial inflow.

Peritoneal dialysis catheter as described with a small amount of
ascites.

Lingular and left lower lobe infiltrate with associated small
effusion.

Small fat containing anterior superior abdominal wall hernias
related to prior surgery.

IUD in place.
